# Patient Record
Sex: Female | Born: 1960
Health system: Southern US, Community
[De-identification: ages and names within clinical notes are randomized; demographics above are authoritative.]

## PROBLEM LIST (undated history)

## (undated) DIAGNOSIS — E559 Vitamin D deficiency, unspecified: Secondary | ICD-10-CM

## (undated) DIAGNOSIS — K589 Irritable bowel syndrome without diarrhea: Secondary | ICD-10-CM

## (undated) DIAGNOSIS — M069 Rheumatoid arthritis, unspecified: Secondary | ICD-10-CM

## (undated) DIAGNOSIS — R911 Solitary pulmonary nodule: Secondary | ICD-10-CM

## (undated) DIAGNOSIS — I10 Essential (primary) hypertension: Secondary | ICD-10-CM

## (undated) DIAGNOSIS — M2550 Pain in unspecified joint: Secondary | ICD-10-CM

## (undated) DIAGNOSIS — F419 Anxiety disorder, unspecified: Secondary | ICD-10-CM

## (undated) DIAGNOSIS — Z8639 Personal history of other endocrine, nutritional and metabolic disease: Secondary | ICD-10-CM

## (undated) DIAGNOSIS — R011 Cardiac murmur, unspecified: Secondary | ICD-10-CM

## (undated) DIAGNOSIS — M810 Age-related osteoporosis without current pathological fracture: Secondary | ICD-10-CM

## (undated) DIAGNOSIS — E2839 Other primary ovarian failure: Secondary | ICD-10-CM

## (undated) DIAGNOSIS — R002 Palpitations: Secondary | ICD-10-CM

## (undated) DIAGNOSIS — E739 Lactose intolerance, unspecified: Secondary | ICD-10-CM

## (undated) DIAGNOSIS — E669 Obesity, unspecified: Secondary | ICD-10-CM

## (undated) DIAGNOSIS — N63 Unspecified lump in unspecified breast: Secondary | ICD-10-CM

## (undated) HISTORY — DX: Cardiac murmur, unspecified: R01.1

## (undated) HISTORY — DX: Pain in unspecified joint: M25.50

## (undated) HISTORY — DX: Other primary ovarian failure: E28.39

## (undated) HISTORY — PX: WISDOM TOOTH EXTRACTION: SHX21

## (undated) HISTORY — DX: Lactose intolerance, unspecified: E73.9

## (undated) HISTORY — DX: Unspecified lump in unspecified breast: N63.0

## (undated) HISTORY — DX: Irritable bowel syndrome without diarrhea: K58.9

## (undated) HISTORY — DX: Rheumatoid arthritis, unspecified: M06.9

## (undated) HISTORY — DX: Anxiety disorder, unspecified: F41.9

## (undated) HISTORY — DX: Personal history of other endocrine, nutritional and metabolic disease: Z86.39

## (undated) HISTORY — DX: Vitamin D deficiency, unspecified: E55.9

## (undated) HISTORY — DX: Obesity, unspecified: E66.9

## (undated) HISTORY — DX: Solitary pulmonary nodule: R91.1

## (undated) HISTORY — PX: EXPLORATORY LAPAROTOMY: SUR591

## (undated) HISTORY — DX: Essential (primary) hypertension: I10

## (undated) HISTORY — DX: Age-related osteoporosis without current pathological fracture: M81.0

## (undated) HISTORY — DX: Palpitations: R00.2

---
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
## 1997-10-03 HISTORY — PX: CHOLECYSTECTOMY: SHX55

## 2007-10-04 DIAGNOSIS — R911 Solitary pulmonary nodule: Secondary | ICD-10-CM

## 2007-10-04 HISTORY — DX: Solitary pulmonary nodule: R91.1

## 2011-02-03 ENCOUNTER — Ambulatory Visit (INDEPENDENT_AMBULATORY_CARE_PROVIDER_SITE_OTHER): Payer: 59 | Admitting: Family Medicine

## 2011-02-03 ENCOUNTER — Encounter: Payer: Self-pay | Admitting: Family Medicine

## 2011-02-03 DIAGNOSIS — M069 Rheumatoid arthritis, unspecified: Secondary | ICD-10-CM

## 2011-02-03 DIAGNOSIS — R011 Cardiac murmur, unspecified: Secondary | ICD-10-CM

## 2011-02-03 DIAGNOSIS — L659 Nonscarring hair loss, unspecified: Secondary | ICD-10-CM

## 2011-02-03 DIAGNOSIS — R5381 Other malaise: Secondary | ICD-10-CM

## 2011-02-03 DIAGNOSIS — R5383 Other fatigue: Secondary | ICD-10-CM | POA: Insufficient documentation

## 2011-02-03 HISTORY — DX: Cardiac murmur, unspecified: R01.1

## 2011-02-03 LAB — CBC WITH DIFFERENTIAL/PLATELET
Eosinophils Relative: 1.7 % (ref 0.0–5.0)
HCT: 39 % (ref 36.0–46.0)
Hemoglobin: 13.3 g/dL (ref 12.0–15.0)
Lymphs Abs: 2.8 10*3/uL (ref 0.7–4.0)
MCV: 91.8 fl (ref 78.0–100.0)
Monocytes Absolute: 0.5 10*3/uL (ref 0.1–1.0)
Monocytes Relative: 6.7 % (ref 3.0–12.0)
Neutro Abs: 4.5 10*3/uL (ref 1.4–7.7)
Platelets: 271 10*3/uL (ref 150.0–400.0)
RDW: 13.4 % (ref 11.5–14.6)
WBC: 8 10*3/uL (ref 4.5–10.5)

## 2011-02-03 LAB — BASIC METABOLIC PANEL
BUN: 13 mg/dL (ref 6–23)
Chloride: 101 mEq/L (ref 96–112)
GFR: 81.93 mL/min (ref >60.00)
Potassium: 4.1 mEq/L (ref 3.5–5.1)

## 2011-02-03 LAB — HEPATIC FUNCTION PANEL
AST: 13 U/L (ref 0–37)
Albumin: 4.1 g/dL (ref 3.5–5.2)
Alkaline Phosphatase: 60 U/L (ref 39–117)
Total Bilirubin: 0.3 mg/dL (ref 0.3–1.2)

## 2011-02-03 LAB — T4, FREE: Free T4: 0.79 ng/dL (ref 0.60–1.60)

## 2011-02-03 LAB — TSH: TSH: 0.69 u[IU]/mL (ref 0.35–5.50)

## 2011-02-03 MED ORDER — INFLIXIMAB 100 MG IV SOLR: 100.0000 mg | INTRAVENOUS | Status: DC

## 2011-02-03 NOTE — Assessment & Plan Note (Signed)
Check labs Pt eats healthy diet

## 2011-02-03 NOTE — Assessment & Plan Note (Signed)
Consider cardio

## 2011-02-03 NOTE — Assessment & Plan Note (Signed)
On remicade per rheum

## 2011-02-03 NOTE — Progress Notes (Signed)
Quick Note:  Thyroid is normal b12 is low---- b12 injections weekly x4 Then monthly Recheck b12 1 month Refer to cardiology for extreme fatigue and bradycardia ______

## 2011-02-03 NOTE — Patient Instructions (Signed)
Check labs today We will consider Cardio referral if labs are normal Call us with any changes in symptoms

## 2011-02-03 NOTE — Progress Notes (Signed)
  Subjective:    Patient ID: Sharon Hunter, female    DOB: 11-10-1960, 50 y.o.   MRN: [id number]  HPI Pt here c/o extreme fatigue in episodes of no longer than 12 hours.   Pt recently went hiking ( which she does all the time with no problems) and became very sob and diaphoretic and was not able to finish hike.   She has no energy.  No Chest pain no muscle aches.  Normally she is not SOB as well.  Pt is establishing with Dr Corliss Skains as well for RA.  She also c/o throat pain. No trouble swallowing, no heartburn.  Pt also c/o hair loss--her hair has been coming out in clumps for a while now.     Review of Systems  Review of Systems  Constitutional: Negative for activity change, appetite change and fatigue.  HENT: Negative for hearing loss, congestion, tinnitus and ear discharge.   Eyes: Negative for visual disturbance (see optho q1y -- vision corrected to 20/20 with glasses).  Respiratory: Negative for cough, chest tightness and shortness of breath.   Cardiovascular: Negative for chest pain, palpitations and leg swelling.  + exercises--walking/hiking a week and weights. Gastrointestinal: Negative for abdominal pain, diarrhea, constipation and abdominal distention.  Genitourinary: Negative for urgency, frequency, decreased urine volume and difficulty urinating.  Musculoskeletal: Negative for back pain, arthralgias and gait problem.  Skin: Negative for color change, pallor and rash.  Neurological: Negative for dizziness, light-headedness, numbness and headaches.  Hematological: Negative for adenopathy. Does not bruise/bleed easily.  Psychiatric/Behavioral: Negative for suicidal ideas, confusion, sleep disturbance, self-injury, dysphoric mood, decreased concentration and agitation.       Objective:   Physical Exam  Constitutional: She is oriented to person, place, and time. She appears well-developed and well-nourished.  Neck: Normal range of motion. Neck supple.  Cardiovascular:  Normal rate and regular rhythm.   Murmur heard.      +SEM  2/6  Pulmonary/Chest: Effort normal and breath sounds normal.  Musculoskeletal: Normal range of motion. She exhibits no edema and no tenderness.  Neurological: She is alert and oriented to person, place, and time.  Skin: Skin is warm and dry. No rash noted. No erythema. No pallor.  Psychiatric: She has a normal mood and affect. Her behavior is normal. Judgment and thought content normal.          Assessment & Plan:

## 2011-02-03 NOTE — Assessment & Plan Note (Signed)
Check labs  ? Heart murmur or bradycardia Refer to cardio after labs back

## 2011-02-04 ENCOUNTER — Telehealth: Payer: Self-pay | Admitting: *Deleted

## 2011-02-04 ENCOUNTER — Encounter: Payer: Self-pay | Admitting: *Deleted

## 2011-02-04 ENCOUNTER — Encounter: Payer: Self-pay | Admitting: Family Medicine

## 2011-02-04 DIAGNOSIS — R001 Bradycardia, unspecified: Secondary | ICD-10-CM

## 2011-02-04 DIAGNOSIS — R5383 Other fatigue: Secondary | ICD-10-CM

## 2011-02-04 MED ORDER — CYANOCOBALAMIN 1000 MCG/ML IJ SOLN: 1000.0000 ug | INTRAMUSCULAR | Status: DC

## 2011-02-04 NOTE — Telephone Encounter (Addendum)
Discuss with patient, copy of labs mailed, Rx sent to pharmacy.   Message copied by Candie Echevaria on Fri Feb 04, 2011 11:15 AM ------      Message from: Loreen Freud      Created: Thu Feb 03, 2011  6:10 PM       Thyroid is normal      b12 is low---- b12 injections weekly x4  Then monthly      Recheck b12 1 month      Refer to cardiology for extreme fatigue and bradycardia

## 2011-02-16 ENCOUNTER — Institutional Professional Consult (permissible substitution): Payer: 59 | Admitting: Cardiology

## 2011-02-17 ENCOUNTER — Encounter: Payer: Self-pay | Admitting: Cardiology

## 2011-02-17 ENCOUNTER — Encounter: Payer: Self-pay | Admitting: *Deleted

## 2011-02-18 ENCOUNTER — Encounter: Payer: Self-pay | Admitting: Cardiology

## 2011-02-18 ENCOUNTER — Ambulatory Visit (INDEPENDENT_AMBULATORY_CARE_PROVIDER_SITE_OTHER): Payer: 59 | Admitting: Cardiology

## 2011-02-18 VITALS — BP 135/86 | HR 62 | Resp 16 | Ht 67.0 in | Wt 190.0 lb

## 2011-02-18 DIAGNOSIS — R0602 Shortness of breath: Secondary | ICD-10-CM

## 2011-02-18 DIAGNOSIS — R5381 Other malaise: Secondary | ICD-10-CM

## 2011-02-18 DIAGNOSIS — R5383 Other fatigue: Secondary | ICD-10-CM

## 2011-02-18 NOTE — Patient Instructions (Signed)
Your physician recommends that you schedule a follow-up appointment as needed with Dr. Shirlee Latch Your physician has requested that you have an echocardiogram. Echocardiography is a painless test that uses sound waves to create images of your heart. It provides your doctor with information about the size and shape of your heart and how well your heart's chambers and valves are working. This procedure takes approximately one hour. There are no restrictions for this procedure.

## 2011-02-20 ENCOUNTER — Encounter: Payer: Self-pay | Admitting: Cardiology

## 2011-02-20 NOTE — Assessment & Plan Note (Signed)
Patient has periodic episodes of profound fatigue that can last prolonged periods.  TSH and CBC normal.  She has excellent exercise capacity.  I do not think that these episodes are cardiac-related.  It is possible that she could be having a flare-up of her RA as she has been off infliximab now for a number of months.  - Followup with Dr. Corliss Skains (rheumatology) - Echocardiogram to make sure that the heart is structurally normal.

## 2011-02-20 NOTE — Progress Notes (Signed)
PCP: Dr. Laury Axon  50 yo with history of rheumatoid arthritis presents for evaluation of periodic fatigue and diaphoresis.  Patient has had episodic fatigue for 2 months now.  She noticed it first on a hiking trip to the mountains.  She felt profoundly fatigued and was unable to keep up with her hiking partners.  Since then, she has had episodes about every 4 days where she will feel profoundly fatigued.  This is not associated with exertion in particular.  It is also associated with diaphoresis at times.  No exertional chest pain or dyspnea.  She walks or hikes for 12-15 miles/week with no problems.  No lightheadedness or syncope.  She is doing well from the standpoint of her rheumatoid arthritis (minimal pain).   ECG: NSR at 54, normal  Labs (5/12): K 4.1, creatinine 0.8, TSH normal, free T4 normal, HCT 34     PMH: 1. HTN 2. Rheumatoid arthritis, on infliximab 3. B12 deficiency 4. Vitamin D deficiency 5. IBS 6. 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   FH: Sister with PCI at 30, father with MI at 3  ROS: All systems reviewed and negative except as per HPI  Current Outpatient Prescriptions  Medication Sig Dispense Refill  . ALPRAZolam (XANAX) 0.5 MG tablet Take 0.5 mg by mouth at bedtime as needed.        . cyanocobalamin (,VITAMIN B-12,) 1000 MCG/ML injection Inject 1 mL (1,000 mcg total) into the muscle once a week. x4 then monthly   4 mL  1  . losartan (COZAAR) 50 MG tablet Take 50 mg by mouth daily.        . NON FORMULARY Take by mouth daily. Napal juice drink daily for inflammation       . NON FORMULARY 300 mg. remicade  8 weeks infusion       . Vitamin D, Ergocalciferol, (DRISDOL) 50000 UNITS CAPS Take 50,000 Units by mouth every 7 (seven) days.          BP 135/86  Pulse 62  Resp 16  Ht 5\' 7"  (1.702 m)  Wt 190 lb (86.183 kg)  BMI 29.76 kg/m2  LMP 10/03/2005 General: NAD Neck: No JVD, no  thyromegaly or thyroid nodule.  Lungs: Clear to auscultation bilaterally with normal respiratory effort. CV: Nondisplaced PMI.  Heart regular S1/S2, no S3/S4, no murmur.  No peripheral edema.  No carotid bruit.  Normal pedal pulses.  Abdomen: Soft, nontender, no hepatosplenomegaly, no distention.  Skin: Intact without lesions or rashes.  Neurologic: Alert and oriented x 3.  Psych: Normal affect. Extremities: No clubbing or cyanosis.  HEENT: Normal.

## 2011-02-24 ENCOUNTER — Ambulatory Visit: Payer: 59 | Admitting: Family Medicine

## 2011-02-24 ENCOUNTER — Telehealth: Payer: Self-pay | Admitting: Family Medicine

## 2011-02-25 ENCOUNTER — Encounter: Payer: Self-pay | Admitting: Family Medicine

## 2011-02-25 MED ORDER — LOSARTAN POTASSIUM 50 MG PO TABS: 50.0000 mg | ORAL_TABLET | Freq: Every day | ORAL | Status: DC

## 2011-02-25 NOTE — Telephone Encounter (Signed)
Medication sent to pharmacy  

## 2011-03-01 ENCOUNTER — Other Ambulatory Visit (HOSPITAL_COMMUNITY): Payer: 59 | Admitting: Radiology

## 2011-03-11 ENCOUNTER — Other Ambulatory Visit (INDEPENDENT_AMBULATORY_CARE_PROVIDER_SITE_OTHER): Payer: 59 | Admitting: *Deleted

## 2011-03-11 DIAGNOSIS — E538 Deficiency of other specified B group vitamins: Secondary | ICD-10-CM

## 2011-03-11 LAB — VITAMIN B12: Vitamin B-12: 444 pg/mL (ref 211–911)

## 2011-06-21 ENCOUNTER — Other Ambulatory Visit: Payer: Self-pay | Admitting: Family Medicine

## 2011-06-21 MED ORDER — LOSARTAN POTASSIUM 50 MG PO TABS: 50.0000 mg | ORAL_TABLET | Freq: Every day | ORAL | Status: DC

## 2011-06-21 NOTE — Telephone Encounter (Signed)
Rx faxed.    KP 

## 2011-07-04 ENCOUNTER — Encounter: Payer: Self-pay | Admitting: Family Medicine

## 2011-07-04 ENCOUNTER — Other Ambulatory Visit (HOSPITAL_COMMUNITY)
Admission: RE | Admit: 2011-07-04 | Discharge: 2011-07-04 | Disposition: A | Discharge status: Home / Self Care | Payer: 59 | Source: Ambulatory Visit | Attending: Family Medicine | Admitting: Family Medicine

## 2011-07-04 ENCOUNTER — Ambulatory Visit (INDEPENDENT_AMBULATORY_CARE_PROVIDER_SITE_OTHER): Payer: 59 | Admitting: Family Medicine

## 2011-07-04 VITALS — BP 136/84 | HR 75 | Temp 98.6°F | Ht 66.0 in | Wt 203.6 lb

## 2011-07-04 DIAGNOSIS — E559 Vitamin D deficiency, unspecified: Secondary | ICD-10-CM

## 2011-07-04 DIAGNOSIS — Z01419 Encounter for gynecological examination (general) (routine) without abnormal findings: Secondary | ICD-10-CM

## 2011-07-04 DIAGNOSIS — K589 Irritable bowel syndrome without diarrhea: Secondary | ICD-10-CM

## 2011-07-04 DIAGNOSIS — R911 Solitary pulmonary nodule: Secondary | ICD-10-CM

## 2011-07-04 DIAGNOSIS — I10 Essential (primary) hypertension: Secondary | ICD-10-CM

## 2011-07-04 DIAGNOSIS — J984 Other disorders of lung: Secondary | ICD-10-CM

## 2011-07-04 DIAGNOSIS — Z1231 Encounter for screening mammogram for malignant neoplasm of breast: Secondary | ICD-10-CM

## 2011-07-04 MED ORDER — VITAMIN D (ERGOCALCIFEROL) 1.25 MG (50000 UNIT) PO CAPS: 50000.0000 [IU] | ORAL_CAPSULE | ORAL | Status: DC

## 2011-07-04 MED ORDER — LOSARTAN POTASSIUM 100 MG PO TABS: 100.0000 mg | ORAL_TABLET | Freq: Every day | ORAL | Status: DC

## 2011-07-04 NOTE — Patient Instructions (Signed)

## 2011-07-04 NOTE — Progress Notes (Signed)
Subjective:     Sharon Hunter is a 50 y.o. female and is here for a comprehensive physical exam. The patient reports problems - pt still c/o abd pain--- pt with hx IBS.  History   Social History  . Marital Status: Single    Spouse Name: N/A    Number of Children: 1  . Years of Education: 17   Occupational History  . Not on file.   Social History Main Topics  . Smoking status: Former Smoker    Quit date: 10/03/1990  . Smokeless tobacco: Not on file  . Alcohol Use: 2.5 oz/week    5 drink(s) per week  . Drug Use: Not on file  . Sexually Active: Not on file   Other Topics Concern  . Not on file   Social History Narrative  . No narrative on file   Health Maintenance  Topic Date Due  . Pap Smear  04/04/1979  . Mammogram  04/04/2011  . Colonoscopy  04/04/2011  . Influenza Vaccine  07/04/2011  . Tetanus/tdap  12/02/2019    The following portions of the patient's history were reviewed and updated as appropriate: allergies, current medications, past family history, past medical history, past social history, past surgical history and problem list.  Review of Systems Review of Systems  Constitutional: Negative for activity change, appetite change and fatigue.  HENT: Negative for hearing loss, congestion, tinnitus and ear discharge.  dentist q11m Eyes: Negative for visual disturbance (see optho q1y -- vision corrected to 20/20 with glasses).  Respiratory: Negative for cough, chest tightness and shortness of breath.   Cardiovascular: Negative for chest pain, palpitations and leg swelling.  Gastrointestinal: Negative for abdominal pain, diarrhea, constipation and abdominal distention.  Genitourinary: Negative for urgency, frequency, decreased urine volume and difficulty urinating.  Musculoskeletal: Negative for back pain, arthralgias and gait problem.  Skin: Negative for color change, pallor and rash.  Neurological: Negative for dizziness, light-headedness, numbness and headaches.    Hematological: Negative for adenopathy. Does not bruise/bleed easily.  Psychiatric/Behavioral: Negative for suicidal ideas, confusion, sleep disturbance, self-injury, dysphoric mood, decreased concentration and agitation.       Objective:    BP 136/84  Pulse 75  Temp(Src) 98.6 F (37 C) (Oral)  Wt 203 lb 9.6 oz (92.352 kg)  SpO2 97%  LMP 10/03/2005 General appearance: alert, cooperative, appears stated age and no distress Throat: lips, mucosa, and tongue normal; teeth and gums normal Neck: no adenopathy, no carotid bruit, no JVD, supple, symmetrical, trachea midline and thyroid not enlarged, symmetric, no tenderness/mass/nodules Lungs: clear to auscultation bilaterally Heart: S1, S2 normal and + SEM Abdomen: normal findings: bowel sounds normal, liver span normal to percussion, no masses palpable and no organomegaly and abnormal findings:  + min tenderness periumbilical area--no rebound/ guarding Pelvic: cervix normal in appearance, external genitalia normal, no adnexal masses or tenderness, no cervical motion tenderness, rectovaginal septum normal, uterus normal size, shape, and consistency and vagina normal without discharge Extremities: extremities normal, atraumatic, no cyanosis or edema Pulses: 2+ and symmetric Skin: Skin color, texture, turgor normal. No rashes or lesions Lymph nodes: Cervical, supraclavicular, and axillary nodes normal. Neurologic: Alert and oriented X 3, normal strength and tone. Normal symmetric reflexes. Normal coordination and gait    Assessment:    Healthy female exam.     Hx IBS---refer to GI,  Pt also needs colon HTN--- inc cozaar to 100 mg qd  Plan:  Check fasting labs    See After Visit Summary for Counseling  Recommendations

## 2011-07-05 ENCOUNTER — Encounter: Payer: Self-pay | Admitting: Gastroenterology

## 2011-07-05 ENCOUNTER — Encounter: Payer: Self-pay | Admitting: Internal Medicine

## 2011-07-15 ENCOUNTER — Ambulatory Visit
Admission: RE | Admit: 2011-07-15 | Discharge: 2011-07-15 | Disposition: A | Discharge status: Home / Self Care | Payer: 59 | Source: Ambulatory Visit | Attending: Family Medicine | Admitting: Family Medicine

## 2011-07-15 DIAGNOSIS — Z1231 Encounter for screening mammogram for malignant neoplasm of breast: Secondary | ICD-10-CM

## 2011-09-02 ENCOUNTER — Encounter: Payer: Self-pay | Admitting: Internal Medicine

## 2011-09-02 ENCOUNTER — Ambulatory Visit (INDEPENDENT_AMBULATORY_CARE_PROVIDER_SITE_OTHER): Payer: 59 | Admitting: Internal Medicine

## 2011-09-02 VITALS — BP 108/70 | HR 72 | Ht 67.0 in | Wt 207.0 lb

## 2011-09-02 DIAGNOSIS — R102 Pelvic and perineal pain: Secondary | ICD-10-CM

## 2011-09-02 DIAGNOSIS — R198 Other specified symptoms and signs involving the digestive system and abdomen: Secondary | ICD-10-CM

## 2011-09-02 DIAGNOSIS — R109 Unspecified abdominal pain: Secondary | ICD-10-CM

## 2011-09-02 DIAGNOSIS — K589 Irritable bowel syndrome without diarrhea: Secondary | ICD-10-CM

## 2011-09-02 MED ORDER — PEG-KCL-NACL-NASULF-NA ASC-C 100 G PO SOLR: 1.0000 | Freq: Once | ORAL | Status: DC

## 2011-09-02 NOTE — Progress Notes (Addendum)
Subjective:    Patient ID: Sharon Hunter, female    DOB: June 01, 1961, 50 y.o.   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 She is here about long-term suprapubic abdominal pain as well as change in bowel habits. She reports about very defined area of pain in between the umbilicus and symphysis pubis, which has been there about 3 years. It is not directly associated with urinary symptoms though she's had some history of urinary tract infection as well as microhematuria. She saw a urologist in [place] and after an in and out catheterization was told there were no problems. That was the extent of the evaluation.  The pain is described as sharp and crampy at times, intermittent, maybe increasing in frequency. It occurs post defecation more often than not. She also says that her bowel movements have always been sluggish or on the constipated side. Now over the past few years she's having intermittent explosive diarrhea at times. Stool caliber is smaller. There is no bleeding. There is some associated bloating and gas. She did have a colonoscopy around 1990, she reports that it was incomplete. It was to evaluate abdominal pain.  She has a history of rheumatoid arthritis and has been on prednisone and Remicade, steroids were stopped many years ago, in the past year, in fact 50 months, she has stopped Remicade and remains in remission. She has some concern about the possibility of complications due to Remicade such as cancer or atypical infection. She has been labeled as having irritable bowel syndrome in the past, it seems.  She drinks 2 cups of caffeinated beverages a day. She is not a smoker. He is not complaining of anxiety or particular stress.   Allergies  Allergen Reactions  . Other     Contrast dye   Outpatient Prescriptions Prior to Visit  Medication Sig Dispense Refill  . ALPRAZolam (XANAX) 0.5 MG tablet Take 0.5 mg  by mouth at bedtime as needed.        Marland Kitchen losartan (COZAAR) 100 MG tablet Take 1 tablet (100 mg total) by mouth daily.  90 tablet  3  . Vitamin D, Ergocalciferol, (DRISDOL) 50000 UNITS CAPS Take 1 capsule (50,000 Units total) by mouth every 7 (seven) days.  12 capsule  3   Past Medical History  Diagnosis Date  . Rheumatoid arthritis     in remission , off meds as of 50 year 2012  . Hypertension   . Migraine   . Breast lump     stable-- gets annual mammogram  . Pulmonary nodule, left 2009    last CT Nov 2011-- due 08/2011  . Murmur 02/03/2011  . Anxiety   . Ovarian failure     Past Surgical History  Procedure Date  . Cholecystectomy 1999  . Tonsillectomy 1969   History   Social History  . Marital Status:  divorced    Spouse Name: N/A    Number of Children: 1  . Years of Education: 16   Social History Main Topics  . Smoking status: Former Smoker    Quit date: 10/03/1990  . Smokeless tobacco: None  . Alcohol Use: 2.5 oz/week    5 drink(s) per week     Red wine 4 oz 4-5 times a week  . Drug Use: No  . Sexually Active: None        [facility] RN       Family History  Problem Relation  Age of Onset  . Alcohol abuse Father   . Hyperlipidemia Father   . Heart disease Father   . Alcohol abuse Brother   . Diabetes Brother   . Hypertension Brother   . Arthritis Mother   . Uterine cancer Mother   . Hypertension Mother   . Uterine cancer Sister   . Heart disease Sister     cardiac stent  . Diabetes Sister   . Hyperlipidemia Sister   . Hypertension Sister   . Cancer Brother     lung  . Cancer Maternal Aunt     lung----non smoker       Review of Systems some back pain, history of heart murmur, all other review of systems reviewed and are negative except as mentioned above.     Objective:   Physical Exam General:  Well-developed, well-nourished and in no acute distress Eyes:  anicteric. ENT:   Mouth and posterior pharynx free of lesions.  Neck:      supple w/o thyromegaly or mass.  Lungs: Clear to auscultation bilaterally. Heart:  S1S2, no rubs, murmurs, gallops. Abdomen:  soft, , no hepatosplenomegaly, hernia, or mass and BS+.  Mildly tender in the suprapubic area Rectal: Deferred until colonoscopy Lymph:  no cervical or supraclavicular adenopathy. Extremities:   no edema Neuro:  A&O x 3.  Psych:  appropriate mood and  affect.   Data reviewed includes prior office notes laboratory studies filed in the records.      Assessment & Plan:  She has 3 years or so suprapubic abdominal pain and change in bowel habits. There is a history of Remicade. Etiology of these problems are not clear, they are most likely IBS but there is a change in bowel habits including decreased stool caliber.  Colonoscopy will be undertaken to investigate these problems.The risks and benefits as well as alternatives of endoscopic procedure(s) have been discussed and reviewed. All questions answered. The patient agrees to proceed.   She may need to see urology, she had microhematuria in the past but from what she is telling me she just had an in and out catheterization urinalysis and was told there were no problems.

## 2011-09-02 NOTE — Patient Instructions (Signed)
You have been scheduled for a Colonoscopy with separate instructions given. Your prep kit has been sent to your pharmacy for you to pick up. 

## 2011-09-04 ENCOUNTER — Encounter: Payer: Self-pay | Admitting: Internal Medicine

## 2011-09-21 ENCOUNTER — Other Ambulatory Visit (INDEPENDENT_AMBULATORY_CARE_PROVIDER_SITE_OTHER): Payer: 59 | Admitting: *Deleted

## 2011-09-21 DIAGNOSIS — Z01419 Encounter for gynecological examination (general) (routine) without abnormal findings: Secondary | ICD-10-CM

## 2011-09-21 DIAGNOSIS — I10 Essential (primary) hypertension: Secondary | ICD-10-CM

## 2011-09-21 LAB — LIPID PANEL
Cholesterol: 200 mg/dL (ref 0–200)
HDL: 82.9 mg/dL (ref >39.00)
LDL Cholesterol: 98 mg/dL (ref 0–99)
Total CHOL/HDL Ratio: 2
Triglycerides: 95 mg/dL (ref 0.0–149.0)
VLDL: 19 mg/dL (ref 0.0–40.0)

## 2011-09-21 LAB — CBC WITH DIFFERENTIAL/PLATELET
Basophils Absolute: 0 10*3/uL (ref 0.0–0.1)
Eosinophils Absolute: 0.1 10*3/uL (ref 0.0–0.7)
Lymphocytes Relative: 33.3 % (ref 12.0–46.0)
MCHC: 33.7 g/dL (ref 30.0–36.0)
MCV: 91.7 fl (ref 78.0–100.0)
Monocytes Absolute: 0.4 10*3/uL (ref 0.1–1.0)
Neutro Abs: 3.7 10*3/uL (ref 1.4–7.7)
Neutrophils Relative %: 58.5 % (ref 43.0–77.0)
RDW: 13.3 % (ref 11.5–14.6)

## 2011-09-21 LAB — TSH: TSH: 0.79 u[IU]/mL (ref 0.35–5.50)

## 2011-09-21 LAB — HEPATIC FUNCTION PANEL
ALT: 20 U/L (ref 0–35)
Bilirubin, Direct: 0 mg/dL (ref 0.0–0.3)
Total Bilirubin: 0.6 mg/dL (ref 0.3–1.2)

## 2011-09-21 LAB — BASIC METABOLIC PANEL
Chloride: 109 mEq/L (ref 96–112)
Creatinine, Ser: 0.7 mg/dL (ref 0.4–1.2)
GFR: 88.13 mL/min (ref >60.00)
Potassium: 4.1 mEq/L (ref 3.5–5.1)

## 2011-09-24 LAB — VITAMIN D 1,25 DIHYDROXY
Vitamin D 1, 25 (OH)2 Total: 54 pg/mL (ref 18–72)
Vitamin D3 1, 25 (OH)2: 10 pg/mL

## 2011-09-26 MED ORDER — VITAMIN D (ERGOCALCIFEROL) 1.25 MG (50000 UNIT) PO CAPS: 50000.0000 [IU] | ORAL_CAPSULE | ORAL | Status: DC

## 2011-10-25 ENCOUNTER — Other Ambulatory Visit: Payer: Self-pay | Admitting: Cardiovascular Disease

## 2011-10-25 MED ORDER — HYDROCOD POLST-CHLORPHEN POLST 10-8 MG/5ML PO LQCR: 5.0000 mL | Freq: Every evening | ORAL | Status: DC | PRN

## 2011-10-25 NOTE — Progress Notes (Signed)
Sharon Hunter has had a cough and sore throat for the past several days.  Her cough is keeping her up all night long.  Will call in Tussionex 115 cc, she is to take 1 tsp at night PRN.  [name], [name]., MD, [facility] 10/25/2011, 9:57 AM

## 2011-10-28 ENCOUNTER — Encounter: Payer: Self-pay | Admitting: Family Medicine

## 2011-10-28 ENCOUNTER — Ambulatory Visit (INDEPENDENT_AMBULATORY_CARE_PROVIDER_SITE_OTHER): Payer: 59 | Admitting: Family Medicine

## 2011-10-28 ENCOUNTER — Ambulatory Visit: Payer: 59 | Admitting: Family Medicine

## 2011-10-28 VITALS — BP 124/82 | HR 64 | Temp 97.9°F | Wt 199.0 lb

## 2011-10-28 DIAGNOSIS — N760 Acute vaginitis: Secondary | ICD-10-CM

## 2011-10-28 DIAGNOSIS — H109 Unspecified conjunctivitis: Secondary | ICD-10-CM

## 2011-10-28 DIAGNOSIS — R32 Unspecified urinary incontinence: Secondary | ICD-10-CM

## 2011-10-28 LAB — POCT URINALYSIS DIPSTICK
Leukocytes, UA: NEGATIVE
Protein, UA: NEGATIVE
Urobilinogen, UA: 0.2
pH, UA: 5

## 2011-10-28 MED ORDER — MOXIFLOXACIN HCL 0.5 % OP SOLN: 1.0000 [drp] | Freq: Three times a day (TID) | OPHTHALMIC | Status: AC

## 2011-10-28 NOTE — Progress Notes (Signed)
  Subjective:    Patient ID: Sharon Hunter, female    DOB: 03-09-1961, 51 y.o.   MRN: [id number]  HPI Pt here c/o pink eye and cough for 1 week  And she is taking tussionex at night.  Pt states she had a fever but didn't take temp---just felt warm.    No other otc. Pt also has vaginal d/c--- thin, watery, yellow.  No odor, no vaginal itching etc.  No abd pain.   Review of Systems as above   Objective:   Physical Exam  Constitutional: She is oriented to person, place, and time. She appears well-developed and well-nourished. No distress.  Cardiovascular: Normal rate and regular rhythm.   No murmur heard. Pulmonary/Chest: Effort normal and breath sounds normal. No respiratory distress. She has no wheezes. She has no rales. She exhibits no tenderness.  Genitourinary: Uterus normal. Vaginal discharge found.       + thin watery, yellow d/c No d/c  Neurological: She is alert and oriented to person, place, and time.  Skin: She is not diaphoretic.  Psychiatric: She has a normal mood and affect. Her behavior is normal. Judgment and thought content normal.          Assessment & Plan:  Vag d/c--- ? Etiology--- cultures sent                  UA neg Conjunctivitis-- vigamox gtts,   rto prn

## 2011-10-28 NOTE — Progress Notes (Signed)
Addended by: Arnette Norris on: 10/28/2011 01:11 PM   Modules accepted: Orders

## 2011-10-31 ENCOUNTER — Ambulatory Visit (AMBULATORY_SURGERY_CENTER): Payer: 59 | Admitting: Internal Medicine

## 2011-10-31 ENCOUNTER — Encounter: Payer: Self-pay | Admitting: Internal Medicine

## 2011-10-31 VITALS — BP 157/72 | HR 87 | Temp 97.9°F | Resp 16 | Ht 67.0 in | Wt 207.0 lb

## 2011-10-31 DIAGNOSIS — R198 Other specified symptoms and signs involving the digestive system and abdomen: Secondary | ICD-10-CM

## 2011-10-31 HISTORY — PX: COLONOSCOPY: SHX174

## 2011-10-31 LAB — WET PREP BY MOLECULAR PROBE
Candida species: NEGATIVE
Gardnerella vaginalis: NEGATIVE
Trichomonas vaginosis: NEGATIVE

## 2011-10-31 MED ORDER — SODIUM CHLORIDE 0.9 % IV SOLN: 500.0000 mL | INTRAVENOUS | Status: DC

## 2011-10-31 NOTE — Patient Instructions (Addendum)
The colonoscopy was normal. This supports a diagnosis of IBS or Irritable Bowel Syndrome. Please read the information provided. You should increase fiber in your diet to see if that helps smooth out your bowel habits, and you can also add a fiber supplement (example is Metamucil or psyllium, another is Citrucel. You can start with a teaspoon in water daily ad increase to a tablespoon in water every day. Gas and bloating often responds to a probiotic supplement - Align is one we have a lot of success with. Some of your pain could be related to a possible urologic problem and if you have persistent problems with pain I think it would be appropriate to see a urologist (again).  I can see you as needed at this pit. Iva Boop, MD, [facility]'  Please refer to blue and green discharge instruction sheets.

## 2011-10-31 NOTE — Progress Notes (Signed)
Patient did not have preoperative order for IV antibiotic SSI prophylaxis. (G8918)  Patient did not experience any of the following events: a burn prior to discharge; a fall within the facility; wrong site/side/patient/procedure/implant event; or a hospital transfer or hospital admission upon discharge from the facility. (G8907)  

## 2011-10-31 NOTE — Op Note (Signed)
[facility] [address] [place], [place]  [address]  COLONOSCOPY PROCEDURE REPORT  PATIENT:  Sharon Hunter, Sharon Hunter  MR#:  [id number] BIRTHDATE:  November 29, 1960, 50 yrs. old  GENDER:  female ENDOSCOPIST:  Iva Boop, MD, [facility] REF. BY:  Loreen Freud, DO PROCEDURE DATE:  10/31/2011 PROCEDURE:  Colonoscopy 86578 ASA CLASS:  Class I INDICATIONS:  change in bowel habits MEDICATIONS:   These medications were titrated to patient response per physician's verbal order, Fentanyl 50 mcg IV, Versed 8 mg IV  DESCRIPTION OF PROCEDURE:   After the risks benefits and alternatives of the procedure were thoroughly explained, informed consent was obtained.  Digital rectal exam was performed and revealed no abnormalities.   The LB [id number] endoscope was introduced through the anus and advanced to the cecum, which was identified by both the appendix and ileocecal valve, without limitations.  The quality of the prep was excellent, using MoviPrep.  The instrument was then slowly withdrawn as the colon was fully examined. <<PROCEDUREIMAGES>>  FINDINGS:  A normal appearing cecum, ileocecal valve, and appendiceal orifice were identified. The ascending, hepatic flexure, transverse, splenic flexure, descending, sigmoid colon, and rectum appeared unremarkable.   Retroflexed views in the rectum revealed no abnormalities.    The time to cecum = 3:24 minutes. The scope was then withdrawn in 13:45 minutes from the cecum and the procedure completed. COMPLICATIONS:  None ENDOSCOPIC IMPRESSION: 1) Normal colonoscopy with excellent prep RECOMMENDATIONS: 1) Increase fiber 2) IBS handout 3) Add probiotic if fiber not enough 4) If persistent abdominal pain would see GU 5) F/U GI prn REPEAT EXAM:  In 10 year(s) for routine screening colonoscopy.  Iva Boop, MD, [facility]  CC:  The Patient and Lelon Perla, DO  n. eSIGNED:   Iva Boop at 10/31/2011 09:08 AM  Mahalia Longest, [phone]

## 2011-10-31 NOTE — Progress Notes (Signed)
Per pt no iv sticks in her hands. ewm

## 2011-11-01 ENCOUNTER — Telehealth: Payer: Self-pay

## 2011-11-01 NOTE — Telephone Encounter (Signed)
  Follow up Call-  Call back number 10/31/2011  Post procedure Call Back phone  # [phone]  Permission to leave phone message Yes     Patient questions:  Do you have a fever, pain , or abdominal swelling? no Pain Score  0 *  Have you tolerated food without any problems? yes  Have you been able to return to your normal activities? yes  Do you have any questions about your discharge instructions: Diet   no Medications  no Follow up visit  no  Do you have questions or concerns about your Care? no  Actions: * If pain score is 4 or above: No action needed, pain <4.

## 2012-05-25 ENCOUNTER — Encounter: Payer: Self-pay | Admitting: Family Medicine

## 2012-05-25 ENCOUNTER — Ambulatory Visit (INDEPENDENT_AMBULATORY_CARE_PROVIDER_SITE_OTHER): Payer: 59 | Admitting: Family Medicine

## 2012-05-25 VITALS — BP 122/72 | HR 70 | Temp 98.3°F | Wt 207.0 lb

## 2012-05-25 DIAGNOSIS — R05 Cough: Secondary | ICD-10-CM

## 2012-05-25 DIAGNOSIS — R911 Solitary pulmonary nodule: Secondary | ICD-10-CM

## 2012-05-25 DIAGNOSIS — R059 Cough, unspecified: Secondary | ICD-10-CM

## 2012-05-25 DIAGNOSIS — J309 Allergic rhinitis, unspecified: Secondary | ICD-10-CM

## 2012-05-25 DIAGNOSIS — J302 Other seasonal allergic rhinitis: Secondary | ICD-10-CM

## 2012-05-25 MED ORDER — FLUTICASONE PROPIONATE 50 MCG/ACT NA SUSP: 2.0000 | Freq: Every day | NASAL | Status: DC

## 2012-05-25 MED ORDER — LOSARTAN POTASSIUM 100 MG PO TABS: 100.0000 mg | ORAL_TABLET | Freq: Every day | ORAL | Status: DC

## 2012-05-25 NOTE — Progress Notes (Signed)
  Subjective:     Sharon Hunter is a 51 y.o. female here for evaluation of a cough. Onset of symptoms was 1 month ago. Symptoms have been unchanged since that time. The cough is dry and is aggravated by nothing. Associated symptoms include: chest pain. Patient does not have a history of asthma. Patient does not have a history of environmental allergens. Patient has not traveled recently. Patient does not have a history of smoking. Patient has not had a previous chest x-ray. Patient has not had a PPD done.  Pt has had CTs of chest secondary to lung nodule.  The following portions of the patient's history were reviewed and updated as appropriate: allergies, current medications, past family history, past medical history, past social history, past surgical history and problem list.  Review of Systems Pertinent items are noted in HPI.    Objective:    Oxygen saturation 98% on room air BP 122/72  Pulse 70  Temp 98.3 F (36.8 C) (Oral)  Wt 207 lb (93.895 kg)  SpO2 98%  LMP 10/03/2005 General appearance: alert, cooperative, appears stated age and no distress Lungs: rales RLL Heart: S1, S2 normal Extremities: extremities normal, atraumatic, no cyanosis or edema    Assessment:    Cough ---hx lung nodule    Plan:    Explained lack of efficacy of antibiotics in viral disease. Avoid exposure to tobacco smoke and fumes. Call if shortness of breath worsens, blood in sputum, change in character of cough, development of fever or chills, inability to maintain nutrition and hydration. Avoid exposure to tobacco smoke and fumes.  cxr Pt due for ct--will get records

## 2012-05-25 NOTE — Patient Instructions (Signed)

## 2012-06-01 ENCOUNTER — Ambulatory Visit (INDEPENDENT_AMBULATORY_CARE_PROVIDER_SITE_OTHER)
Admission: RE | Admit: 2012-06-01 | Discharge: 2012-06-01 | Disposition: A | Discharge status: Home / Self Care | Payer: 59 | Source: Ambulatory Visit | Attending: Family Medicine | Admitting: Family Medicine

## 2012-06-01 DIAGNOSIS — R911 Solitary pulmonary nodule: Secondary | ICD-10-CM

## 2012-07-04 ENCOUNTER — Telehealth: Payer: Self-pay

## 2012-07-04 NOTE — Telephone Encounter (Signed)
Dr.Lowne reviewed records and per documentation nor further CT necessary. Patient has been mae aware and voiced understanding,     KP

## 2012-10-04 ENCOUNTER — Other Ambulatory Visit: Payer: Self-pay | Admitting: Family Medicine

## 2012-10-04 DIAGNOSIS — Z1231 Encounter for screening mammogram for malignant neoplasm of breast: Secondary | ICD-10-CM

## 2012-10-08 ENCOUNTER — Telehealth: Payer: Self-pay

## 2012-10-08 DIAGNOSIS — E282 Polycystic ovarian syndrome: Secondary | ICD-10-CM

## 2012-10-08 NOTE — Telephone Encounter (Signed)
Message copied by Arnette Norris on Mon Oct 08, 2012  2:53 PM ------      Message from: [name]      Created: Mon Oct 08, 2012  2:08 PM      Regarding: referral        Dr Laury Axon,            This is Sharon Hunter. I have had symptoms for years that now make me wonder if I could have polycystic ovarian disease. 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 I may have been miss diagnosed 35 years ago. They didn't know much about PCOD.             I am wanting a referral to Dr [facility] for a work up, can you please refer me?            Work [facility] [phone]      Cell [phone]            Thank you      Jodette

## 2012-10-08 NOTE — Telephone Encounter (Signed)
Ok to put referral in but if we dont  have any ov discussing it they may want her seen here first.  Also tell her to use phone or My chart for messages so it is documented in her chart.  Staff messages are not permanently in chart.

## 2012-10-10 ENCOUNTER — Ambulatory Visit
Admission: RE | Admit: 2012-10-10 | Discharge: 2012-10-10 | Disposition: A | Discharge status: Home / Self Care | Payer: 59 | Source: Ambulatory Visit | Attending: Family Medicine | Admitting: Family Medicine

## 2012-10-10 DIAGNOSIS — Z1231 Encounter for screening mammogram for malignant neoplasm of breast: Secondary | ICD-10-CM

## 2012-10-12 ENCOUNTER — Ambulatory Visit: Payer: 59

## 2012-11-17 ENCOUNTER — Other Ambulatory Visit: Payer: Self-pay

## 2012-11-20 ENCOUNTER — Other Ambulatory Visit: Payer: Self-pay | Admitting: *Deleted

## 2012-11-20 DIAGNOSIS — M779 Enthesopathy, unspecified: Secondary | ICD-10-CM

## 2012-11-20 MED ORDER — METHYLPREDNISOLONE 4 MG PO KIT: PACK | ORAL | Status: DC

## 2012-12-10 ENCOUNTER — Other Ambulatory Visit: Payer: Self-pay | Admitting: Family Medicine

## 2013-02-01 ENCOUNTER — Other Ambulatory Visit: Payer: Self-pay | Admitting: *Deleted

## 2013-02-01 ENCOUNTER — Ambulatory Visit (INDEPENDENT_AMBULATORY_CARE_PROVIDER_SITE_OTHER): Payer: 59 | Admitting: *Deleted

## 2013-02-01 DIAGNOSIS — Z79899 Other long term (current) drug therapy: Secondary | ICD-10-CM

## 2013-02-01 DIAGNOSIS — R5381 Other malaise: Secondary | ICD-10-CM

## 2013-02-01 DIAGNOSIS — R011 Cardiac murmur, unspecified: Secondary | ICD-10-CM

## 2013-02-01 DIAGNOSIS — M069 Rheumatoid arthritis, unspecified: Secondary | ICD-10-CM

## 2013-02-01 DIAGNOSIS — R911 Solitary pulmonary nodule: Secondary | ICD-10-CM

## 2013-02-01 DIAGNOSIS — R5383 Other fatigue: Secondary | ICD-10-CM

## 2013-02-01 DIAGNOSIS — L659 Nonscarring hair loss, unspecified: Secondary | ICD-10-CM

## 2013-02-01 LAB — CBC WITH DIFFERENTIAL/PLATELET
Basophils Relative: 0.5 % (ref 0.0–3.0)
Eosinophils Absolute: 0.1 10*3/uL (ref 0.0–0.7)
HCT: 39.5 % (ref 36.0–46.0)
Hemoglobin: 13.5 g/dL (ref 12.0–15.0)
Lymphocytes Relative: 40.5 % (ref 12.0–46.0)
Lymphs Abs: 3.1 10*3/uL (ref 0.7–4.0)
MCHC: 34.1 g/dL (ref 30.0–36.0)
MCV: 88.9 fl (ref 78.0–100.0)
Neutro Abs: 3.8 10*3/uL (ref 1.4–7.7)
RBC: 4.44 Mil/uL (ref 3.87–5.11)

## 2013-02-04 LAB — COMPREHENSIVE METABOLIC PANEL
AST: 21 U/L (ref 0–37)
BUN: 17 mg/dL (ref 6–23)
Calcium: 9.2 mg/dL (ref 8.4–10.5)
Chloride: 103 mEq/L (ref 96–112)
Creatinine, Ser: 0.8 mg/dL (ref 0.4–1.2)
GFR: 77.86 mL/min (ref >60.00)
Total Bilirubin: 0.4 mg/dL (ref 0.3–1.2)

## 2013-05-20 ENCOUNTER — Other Ambulatory Visit: Payer: Self-pay | Admitting: *Deleted

## 2013-05-20 MED ORDER — LOSARTAN POTASSIUM 100 MG PO TABS: ORAL_TABLET | ORAL | Status: DC

## 2013-05-20 NOTE — Telephone Encounter (Signed)
Rx was filled spoke with patient and she made an appointment for an office visit.  AG cma

## 2013-05-20 NOTE — Telephone Encounter (Signed)
Pharmacy has requesting a refill for Losartan however the patient need a ov.  I have called patient and she has agreed to schedule a appointment in order to receive her prescription.

## 2013-05-23 ENCOUNTER — Ambulatory Visit (INDEPENDENT_AMBULATORY_CARE_PROVIDER_SITE_OTHER): Payer: 59 | Admitting: Family Medicine

## 2013-05-23 ENCOUNTER — Encounter: Payer: Self-pay | Admitting: Family Medicine

## 2013-05-23 ENCOUNTER — Other Ambulatory Visit (HOSPITAL_COMMUNITY)
Admission: RE | Admit: 2013-05-23 | Discharge: 2013-05-23 | Disposition: A | Discharge status: Home / Self Care | Payer: 59 | Source: Ambulatory Visit | Attending: Family Medicine | Admitting: Family Medicine

## 2013-05-23 VITALS — BP 118/70 | HR 59 | Temp 98.2°F | Ht 66.0 in | Wt 191.0 lb

## 2013-05-23 DIAGNOSIS — I10 Essential (primary) hypertension: Secondary | ICD-10-CM

## 2013-05-23 DIAGNOSIS — Z01419 Encounter for gynecological examination (general) (routine) without abnormal findings: Secondary | ICD-10-CM | POA: Insufficient documentation

## 2013-05-23 DIAGNOSIS — Z124 Encounter for screening for malignant neoplasm of cervix: Secondary | ICD-10-CM

## 2013-05-23 DIAGNOSIS — M069 Rheumatoid arthritis, unspecified: Secondary | ICD-10-CM

## 2013-05-23 DIAGNOSIS — E538 Deficiency of other specified B group vitamins: Secondary | ICD-10-CM

## 2013-05-23 DIAGNOSIS — Z1151 Encounter for screening for human papillomavirus (HPV): Secondary | ICD-10-CM | POA: Insufficient documentation

## 2013-05-23 DIAGNOSIS — Z0000 Encounter for general adult medical examination without abnormal findings: Secondary | ICD-10-CM

## 2013-05-23 DIAGNOSIS — E785 Hyperlipidemia, unspecified: Secondary | ICD-10-CM

## 2013-05-23 LAB — LDL CHOLESTEROL, DIRECT: Direct LDL: 132.6 mg/dL

## 2013-05-23 LAB — LIPID PANEL: Total CHOL/HDL Ratio: 3

## 2013-05-23 LAB — VITAMIN B12: Vitamin B-12: 775 pg/mL (ref 211–911)

## 2013-05-23 MED ORDER — MOMETASONE FUROATE 50 MCG/ACT NA SUSP: 1.0000 | Freq: Every day | NASAL | Status: DC

## 2013-05-23 MED ORDER — LOSARTAN POTASSIUM 100 MG PO TABS: 100.0000 mg | ORAL_TABLET | Freq: Every day | ORAL | Status: DC

## 2013-05-23 NOTE — Progress Notes (Signed)
Subjective:     Sharon Hunter is a 52 y.o. female and is here for a comprehensive physical exam. The patient reports no problems.  History   Social History  . Marital Status: Single    Spouse Name: N/A    Number of Children: 1  . Years of Education: 17   Occupational History  . Not on file.   Social History Main Topics  . Smoking status: Former Smoker    Quit date: 10/03/1990  . Smokeless tobacco: Not on file  . Alcohol Use: 2.5 oz/week    5 drink(s) per week     Comment: Red wine 4 oz 4-5 times a week  . Drug Use: No  . Sexual Activity: Not Currently    Partners: Male   Other Topics Concern  . Not on file   Social History Narrative   Exercise-- jogs   Health Maintenance  Topic Date Due  . Influenza Vaccine  06/03/2013  . Pap Smear  07/03/2014  . Mammogram  10/10/2014  . Tetanus/tdap  12/02/2019  . Colonoscopy  10/30/2021    The following portions of the patient's history were reviewed and updated as appropriate:  She  has a past medical history of Rheumatoid arthritis(714.0); Hypertension; Migraine; Breast lump; Pulmonary nodule, left (2009); Murmur (02/03/2011); Anxiety; and Ovarian failure. She  does not have any pertinent problems on file. She  has past surgical history that includes Cholecystectomy (1999); Tonsillectomy (1969); Exploratory laparotomy; Wisdom tooth extraction; and Colonoscopy (10/31/11). Her family history includes Alcohol abuse in her brother and father; Arthritis in her mother; Cancer in her brother and maternal aunt; Diabetes in her brother and sister; Heart disease in her father and sister; Hyperlipidemia in her father and sister; Hypertension in her brother, mother, and sister; Uterine cancer in her mother and sister. She  reports that she quit smoking about 22 years ago. She does not have any smokeless tobacco history on file. She reports that she drinks about 2.5 ounces of alcohol per week. She reports that she does not use illicit drugs. She  has a current medication list which includes the following prescription(s): cholecalciferol, cyanocobalamin, golimumab, losartan, mometasone, and multivitamin. No current outpatient prescriptions on file prior to visit.   No current facility-administered medications on file prior to visit.   She is allergic to other..  Review of Systems Review of Systems  Constitutional: Negative for activity change, appetite change and fatigue.  HENT: Negative for hearing loss, congestion, tinnitus and ear discharge.  dentist q92m Eyes: Negative for visual disturbance (see optho q1y -- vision corrected to 20/20 with glasses).  Respiratory: Negative for cough, chest tightness and shortness of breath.   Cardiovascular: Negative for chest pain, palpitations and leg swelling.  Gastrointestinal: Negative for abdominal pain, diarrhea, constipation and abdominal distention.  Genitourinary: Negative for urgency, frequency, decreased urine volume and difficulty urinating.  Musculoskeletal: Negative for back pain,  and gait problem.  + arthralgias Skin: Negative for color change, pallor and rash.  Neurological: Negative for dizziness, light-headedness, numbness and headaches.  Hematological: Negative for adenopathy. Does not bruise/bleed easily.  Psychiatric/Behavioral: Negative for suicidal ideas, confusion, sleep disturbance, self-injury, dysphoric mood, decreased concentration and agitation.       Objective:    BP 118/70  Pulse 59  Temp(Src) 98.2 F (36.8 C) (Oral)  Ht 5\' 6"  (1.676 m)  Wt 191 lb (86.637 kg)  BMI 30.84 kg/m2  SpO2 97%  LMP 10/03/2005 General appearance: alert, cooperative, appears stated age and no distress  Head: Normocephalic, without obvious abnormality, atraumatic Eyes: conjunctivae/corneas clear. PERRL, EOM's intact. Fundi benign. Ears: normal TM's and external ear canals both ears Nose: Nares normal. Septum midline. Mucosa normal. No drainage or sinus tenderness. Throat: lips,  mucosa, and tongue normal; teeth and gums normal Neck: no adenopathy, no carotid bruit, no JVD, supple, symmetrical, trachea midline and thyroid not enlarged, symmetric, no tenderness/mass/nodules Back: symmetric, no curvature. ROM normal. No CVA tenderness. Lungs: clear to auscultation bilaterally Breasts: normal appearance, no masses or tenderness Heart: regular rate and rhythm, S1, S2 normal, no murmur, click, rub or gallop Abdomen: soft, non-tender; bowel sounds normal; no masses,  no organomegaly Pelvic: cervix normal in appearance, external genitalia normal, no adnexal masses or tenderness, no cervical motion tenderness, rectovaginal septum normal, uterus normal size, shape, and consistency and vagina normal without discharge Extremities: extremities normal, atraumatic, no cyanosis or edema Pulses: 2+ and symmetric Skin: Skin color, texture, turgor normal. No rashes or lesions Lymph nodes: Cervical, supraclavicular, and axillary nodes normal. Neurologic: Alert and oriented X 3, normal strength and tone. Normal symmetric reflexes. Normal coordination and gait Psych--  No anxiety, no depression      Assessment:    Healthy female exam     Plan:    ghm utd Check labs See After Visit Summary for Counseling Recommendations

## 2013-05-23 NOTE — Assessment & Plan Note (Signed)
Pt restarted on meds by rheum

## 2013-05-23 NOTE — Patient Instructions (Addendum)
Preventive Care for Adults, Female A healthy lifestyle and preventive care can promote health and wellness. Preventive health guidelines for women include the following key practices.  A routine yearly physical is a good way to check with your caregiver about your health and preventive screening. It is a chance to share any concerns and updates on your health, and to receive a thorough exam.  Visit your dentist for a routine exam and preventive care every 6 months. Brush your teeth twice a day and floss once a day. Good oral hygiene prevents tooth decay and gum disease.  The frequency of eye exams is based on your age, health, family medical history, use of contact lenses, and other factors. Follow your caregiver's recommendations for frequency of eye exams.  Eat a healthy diet. Foods like vegetables, fruits, whole grains, low-fat dairy products, and lean protein foods contain the nutrients you need without too many calories. Decrease your intake of foods high in solid fats, added sugars, and salt. Eat the right amount of calories for you.Get information about a proper diet from your caregiver, if necessary.  Regular physical exercise is one of the most important things you can do for your health. Most adults should get at least 150 minutes of moderate-intensity exercise (any activity that increases your heart rate and causes you to sweat) each week. In addition, most adults need muscle-strengthening exercises on 2 or more days a week.  Maintain a healthy weight. The body mass index (BMI) is a screening tool to identify possible weight problems. It provides an estimate of body fat based on height and weight. Your caregiver can help determine your BMI, and can help you achieve or maintain a healthy weight.For adults 20 years and older:  A BMI below 18.5 is considered underweight.  A BMI of 18.5 to 24.9 is normal.  A BMI of 25 to 29.9 is considered overweight.  A BMI of 30 and above is  considered obese.  Maintain normal blood lipids and cholesterol levels by exercising and minimizing your intake of saturated fat. Eat a balanced diet with plenty of fruit and vegetables. Blood tests for lipids and cholesterol should begin at age 20 and be repeated every 5 years. If your lipid or cholesterol levels are high, you are over 50, or you are at high risk for heart disease, you may need your cholesterol levels checked more frequently.Ongoing high lipid and cholesterol levels should be treated with medicines if diet and exercise are not effective.  If you smoke, find out from your caregiver how to quit. If you do not use tobacco, do not start.  If you are pregnant, do not drink alcohol. If you are breastfeeding, be very cautious about drinking alcohol. If you are not pregnant and choose to drink alcohol, do not exceed 1 drink per day. One drink is considered to be 12 ounces (355 mL) of beer, 5 ounces (148 mL) of wine, or 1.5 ounces (44 mL) of liquor.  Avoid use of street drugs. Do not share needles with anyone. Ask for help if you need support or instructions about stopping the use of drugs.  High blood pressure causes heart disease and increases the risk of stroke. Your blood pressure should be checked at least every 1 to 2 years. Ongoing high blood pressure should be treated with medicines if weight loss and exercise are not effective.  If you are 55 to 52 years old, ask your caregiver if you should take aspirin to prevent strokes.  Diabetes   screening involves taking a blood sample to check your fasting blood sugar level. This should be done once every 3 years, after age 45, if you are within normal weight and without risk factors for diabetes. Testing should be considered at a younger age or be carried out more frequently if you are overweight and have at least 1 risk factor for diabetes.  Breast cancer screening is essential preventive care for women. You should practice "breast  self-awareness." This means understanding the normal appearance and feel of your breasts and may include breast self-examination. Any changes detected, no matter how small, should be reported to a caregiver. Women in their 20s and 30s should have a clinical breast exam (CBE) by a caregiver as part of a regular health exam every 1 to 3 years. After age 40, women should have a CBE every year. Starting at age 40, women should consider having a mammography (breast X-ray test) every year. Women who have a family history of breast cancer should talk to their caregiver about genetic screening. Women at a high risk of breast cancer should talk to their caregivers about having magnetic resonance imaging (MRI) and a mammography every year.  The Pap test is a screening test for cervical cancer. A Pap test can show cell changes on the cervix that might become cervical cancer if left untreated. A Pap test is a procedure in which cells are obtained and examined from the lower end of the uterus (cervix).  Women should have a Pap test starting at age 21.  Between ages 21 and 29, Pap tests should be repeated every 2 years.  Beginning at age 30, you should have a Pap test every 3 years as long as the past 3 Pap tests have been normal.  Some women have medical problems that increase the chance of getting cervical cancer. Talk to your caregiver about these problems. It is especially important to talk to your caregiver if a new problem develops soon after your last Pap test. In these cases, your caregiver may recommend more frequent screening and Pap tests.  The above recommendations are the same for women who have or have not gotten the vaccine for human papillomavirus (HPV).  If you had a hysterectomy for a problem that was not cancer or a condition that could lead to cancer, then you no longer need Pap tests. Even if you no longer need a Pap test, a regular exam is a good idea to make sure no other problems are  starting.  If you are between ages 65 and 70, and you have had normal Pap tests going back 10 years, you no longer need Pap tests. Even if you no longer need a Pap test, a regular exam is a good idea to make sure no other problems are starting.  If you have had past treatment for cervical cancer or a condition that could lead to cancer, you need Pap tests and screening for cancer for at least 20 years after your treatment.  If Pap tests have been discontinued, risk factors (such as a new sexual partner) need to be reassessed to determine if screening should be resumed.  The HPV test is an additional test that may be used for cervical cancer screening. The HPV test looks for the virus that can cause the cell changes on the cervix. The cells collected during the Pap test can be tested for HPV. The HPV test could be used to screen women aged 30 years and older, and should   be used in women of any age who have unclear Pap test results. After the age of 30, women should have HPV testing at the same frequency as a Pap test.  Colorectal cancer can be detected and often prevented. Most routine colorectal cancer screening begins at the age of 50 and continues through age 75. However, your caregiver may recommend screening at an earlier age if you have risk factors for colon cancer. On a yearly basis, your caregiver may provide home test kits to check for hidden blood in the stool. Use of a small camera at the end of a tube, to directly examine the colon (sigmoidoscopy or colonoscopy), can detect the earliest forms of colorectal cancer. Talk to your caregiver about this at age 50, when routine screening begins. Direct examination of the colon should be repeated every 5 to 10 years through age 75, unless early forms of pre-cancerous polyps or small growths are found.  Hepatitis C blood testing is recommended for all people born from 1945 through 1965 and any individual with known risks for hepatitis C.  Practice  safe sex. Use condoms and avoid high-risk sexual practices to reduce the spread of sexually transmitted infections (STIs). STIs include gonorrhea, chlamydia, syphilis, trichomonas, herpes, HPV, and human immunodeficiency virus (HIV). Herpes, HIV, and HPV are viral illnesses that have no cure. They can result in disability, cancer, and death. Sexually active women aged 25 and younger should be checked for chlamydia. Older women with new or multiple partners should also be tested for chlamydia. Testing for other STIs is recommended if you are sexually active and at increased risk.  Osteoporosis is a disease in which the bones lose minerals and strength with aging. This can result in serious bone fractures. The risk of osteoporosis can be identified using a bone density scan. Women ages 65 and over and women at risk for fractures or osteoporosis should discuss screening with their caregivers. Ask your caregiver whether you should take a calcium supplement or vitamin D to reduce the rate of osteoporosis.  Menopause can be associated with physical symptoms and risks. Hormone replacement therapy is available to decrease symptoms and risks. You should talk to your caregiver about whether hormone replacement therapy is right for you.  Use sunscreen with sun protection factor (SPF) of 30 or more. Apply sunscreen liberally and repeatedly throughout the day. You should seek shade when your shadow is shorter than you. Protect yourself by wearing long sleeves, pants, a wide-brimmed hat, and sunglasses year round, whenever you are outdoors.  Once a month, do a whole body skin exam, using a mirror to look at the skin on your back. Notify your caregiver of new moles, moles that have irregular borders, moles that are larger than a pencil eraser, or moles that have changed in shape or color.  Stay current with required immunizations.  Influenza. You need a dose every fall (or winter). The composition of the flu vaccine  changes each year, so being vaccinated once is not enough.  Pneumococcal polysaccharide. You need 1 to 2 doses if you smoke cigarettes or if you have certain chronic medical conditions. You need 1 dose at age 65 (or older) if you have never been vaccinated.  Tetanus, diphtheria, pertussis (Tdap, Td). Get 1 dose of Tdap vaccine if you are younger than age 65, are over 65 and have contact with an infant, are a healthcare worker, are pregnant, or simply want to be protected from whooping cough. After that, you need a Td   booster dose every 10 years. Consult your caregiver if you have not had at least 3 tetanus and diphtheria-containing shots sometime in your life or have a deep or dirty wound.  HPV. You need this vaccine if you are a woman age 26 or younger. The vaccine is given in 3 doses over 6 months.  Measles, mumps, rubella (MMR). You need at least 1 dose of MMR if you were born in 1957 or later. You may also need a second dose.  Meningococcal. If you are age 19 to 21 and a first-year college student living in a residence hall, or have one of several medical conditions, you need to get vaccinated against meningococcal disease. You may also need additional booster doses.  Zoster (shingles). If you are age 60 or older, you should get this vaccine.  Varicella (chickenpox). If you have never had chickenpox or you were vaccinated but received only 1 dose, talk to your caregiver to find out if you need this vaccine.  Hepatitis A. You need this vaccine if you have a specific risk factor for hepatitis A virus infection or you simply wish to be protected from this disease. The vaccine is usually given as 2 doses, 6 to 18 months apart.  Hepatitis B. You need this vaccine if you have a specific risk factor for hepatitis B virus infection or you simply wish to be protected from this disease. The vaccine is given in 3 doses, usually over 6 months. Preventive Services / Frequency Ages 19 to 39  Blood  pressure check.** / Every 1 to 2 years.  Lipid and cholesterol check.** / Every 5 years beginning at age 20.  Clinical breast exam.** / Every 3 years for women in their 20s and 30s.  Pap test.** / Every 2 years from ages 21 through 29. Every 3 years starting at age 30 through age 65 or 70 with a history of 3 consecutive normal Pap tests.  HPV screening.** / Every 3 years from ages 30 through ages 65 to 70 with a history of 3 consecutive normal Pap tests.  Hepatitis C blood test.** / For any individual with known risks for hepatitis C.  Skin self-exam. / Monthly.  Influenza immunization.** / Every year.  Pneumococcal polysaccharide immunization.** / 1 to 2 doses if you smoke cigarettes or if you have certain chronic medical conditions.  Tetanus, diphtheria, pertussis (Tdap, Td) immunization. / A one-time dose of Tdap vaccine. After that, you need a Td booster dose every 10 years.  HPV immunization. / 3 doses over 6 months, if you are 26 and younger.  Measles, mumps, rubella (MMR) immunization. / You need at least 1 dose of MMR if you were born in 1957 or later. You may also need a second dose.  Meningococcal immunization. / 1 dose if you are age 19 to 21 and a first-year college student living in a residence hall, or have one of several medical conditions, you need to get vaccinated against meningococcal disease. You may also need additional booster doses.  Varicella immunization.** / Consult your caregiver.  Hepatitis A immunization.** / Consult your caregiver. 2 doses, 6 to 18 months apart.  Hepatitis B immunization.** / Consult your caregiver. 3 doses usually over 6 months. Ages 40 to 64  Blood pressure check.** / Every 1 to 2 years.  Lipid and cholesterol check.** / Every 5 years beginning at age 20.  Clinical breast exam.** / Every year after age 40.  Mammogram.** / Every year beginning at age 40   and continuing for as long as you are in good health. Consult with your  caregiver.  Pap test.** / Every 3 years starting at age 30 through age 65 or 70 with a history of 3 consecutive normal Pap tests.  HPV screening.** / Every 3 years from ages 30 through ages 65 to 70 with a history of 3 consecutive normal Pap tests.  Fecal occult blood test (FOBT) of stool. / Every year beginning at age 50 and continuing until age 75. You may not need to do this test if you get a colonoscopy every 10 years.  Flexible sigmoidoscopy or colonoscopy.** / Every 5 years for a flexible sigmoidoscopy or every 10 years for a colonoscopy beginning at age 50 and continuing until age 75.  Hepatitis C blood test.** / For all people born from 1945 through 1965 and any individual with known risks for hepatitis C.  Skin self-exam. / Monthly.  Influenza immunization.** / Every year.  Pneumococcal polysaccharide immunization.** / 1 to 2 doses if you smoke cigarettes or if you have certain chronic medical conditions.  Tetanus, diphtheria, pertussis (Tdap, Td) immunization.** / A one-time dose of Tdap vaccine. After that, you need a Td booster dose every 10 years.  Measles, mumps, rubella (MMR) immunization. / You need at least 1 dose of MMR if you were born in 1957 or later. You may also need a second dose.  Varicella immunization.** / Consult your caregiver.  Meningococcal immunization.** / Consult your caregiver.  Hepatitis A immunization.** / Consult your caregiver. 2 doses, 6 to 18 months apart.  Hepatitis B immunization.** / Consult your caregiver. 3 doses, usually over 6 months. Ages 65 and over  Blood pressure check.** / Every 1 to 2 years.  Lipid and cholesterol check.** / Every 5 years beginning at age 20.  Clinical breast exam.** / Every year after age 40.  Mammogram.** / Every year beginning at age 40 and continuing for as long as you are in good health. Consult with your caregiver.  Pap test.** / Every 3 years starting at age 30 through age 65 or 70 with a 3  consecutive normal Pap tests. Testing can be stopped between 65 and 70 with 3 consecutive normal Pap tests and no abnormal Pap or HPV tests in the past 10 years.  HPV screening.** / Every 3 years from ages 30 through ages 65 or 70 with a history of 3 consecutive normal Pap tests. Testing can be stopped between 65 and 70 with 3 consecutive normal Pap tests and no abnormal Pap or HPV tests in the past 10 years.  Fecal occult blood test (FOBT) of stool. / Every year beginning at age 50 and continuing until age 75. You may not need to do this test if you get a colonoscopy every 10 years.  Flexible sigmoidoscopy or colonoscopy.** / Every 5 years for a flexible sigmoidoscopy or every 10 years for a colonoscopy beginning at age 50 and continuing until age 75.  Hepatitis C blood test.** / For all people born from 1945 through 1965 and any individual with known risks for hepatitis C.  Osteoporosis screening.** / A one-time screening for women ages 65 and over and women at risk for fractures or osteoporosis.  Skin self-exam. / Monthly.  Influenza immunization.** / Every year.  Pneumococcal polysaccharide immunization.** / 1 dose at age 65 (or older) if you have never been vaccinated.  Tetanus, diphtheria, pertussis (Tdap, Td) immunization. / A one-time dose of Tdap vaccine if you are over   65 and have contact with an infant, are a healthcare worker, or simply want to be protected from whooping cough. After that, you need a Td booster dose every 10 years.  Varicella immunization.** / Consult your caregiver.  Meningococcal immunization.** / Consult your caregiver.  Hepatitis A immunization.** / Consult your caregiver. 2 doses, 6 to 18 months apart.  Hepatitis B immunization.** / Check with your caregiver. 3 doses, usually over 6 months. ** Family history and personal history of risk and conditions may change your caregiver's recommendations. Document Released: 11/15/2001 Document Revised: 12/12/2011  Document Reviewed: 02/14/2011 [employer] Patient Information 2014 [employer].  

## 2013-05-27 ENCOUNTER — Telehealth: Payer: Self-pay | Admitting: Family Medicine

## 2013-06-05 NOTE — Telephone Encounter (Signed)
Encounter opened in error

## 2013-07-30 ENCOUNTER — Ambulatory Visit (INDEPENDENT_AMBULATORY_CARE_PROVIDER_SITE_OTHER): Payer: 59 | Admitting: Family Medicine

## 2013-07-30 ENCOUNTER — Encounter: Payer: Self-pay | Admitting: Family Medicine

## 2013-07-30 VITALS — BP 116/64 | HR 68 | Temp 98.5°F | Wt 197.0 lb

## 2013-07-30 DIAGNOSIS — M62838 Other muscle spasm: Secondary | ICD-10-CM

## 2013-07-30 MED ORDER — CYCLOBENZAPRINE HCL 10 MG PO TABS: 10.0000 mg | ORAL_TABLET | Freq: Three times a day (TID) | ORAL | Status: DC | PRN

## 2013-07-30 NOTE — Progress Notes (Signed)
  Subjective:     Sharon Hunter is a 52 y.o. female who presents for evaluation of neck pain. Event that precipitated these symptoms: weight lifting. Onset of symptoms was 7 days ago, and have been gradually worsening since that time. Current symptoms are numbness in L arm on occassion and pain in L trap and neck (sharp, throbbing and tingling in character; 6/10 in severity). Patient denies weakness in L arm. Patient has had no prior neck problems. Previous treatments: nsaids and massage, which helped some.  The following portions of the patient's history were reviewed and updated as appropriate: allergies, current medications, past family history, past medical history, past social history, past surgical history and problem list.  Review of Systems Pertinent items are noted in HPI.    Objective:    BP 116/64  Pulse 68  Temp(Src) 98.5 F (36.9 C) (Oral)  Wt 197 lb (89.359 kg)  BMI 31.81 kg/m2  SpO2 98%  LMP 10/03/2005 General:   alert, cooperative, appears stated age and no distress  External Deformity:  absent  ROM Cervical Spine:  normal range of motion and left rotation to 45 degrees  Midline Tenderness:  absent b/l  Paraspinous tenderness:  mild on the left  UE Neurologic Exam:  unremarkable, normal strength, normal sensation   X-ray of the cervical spine: Not indicated    Assessment:    Cervical strain    Plan:    Agricultural engineer distributed. Discussed appropriate use of ice and heat. NSAIDs per medication orders. Muscle relaxants started per medication orders. Follow up in  2 weeks. ---or sooner prn  Offered pt soft collar-- pt preferred not to use it

## 2013-07-30 NOTE — Patient Instructions (Signed)

## 2013-08-08 ENCOUNTER — Other Ambulatory Visit: Payer: Self-pay

## 2013-11-13 ENCOUNTER — Ambulatory Visit (INDEPENDENT_AMBULATORY_CARE_PROVIDER_SITE_OTHER): Payer: 59 | Admitting: Nurse Practitioner

## 2013-11-13 ENCOUNTER — Encounter: Payer: Self-pay | Admitting: Nurse Practitioner

## 2013-11-13 VITALS — BP 145/86 | HR 69 | Temp 98.9°F | Ht 66.0 in | Wt 192.0 lb

## 2013-11-13 DIAGNOSIS — J111 Influenza due to unidentified influenza virus with other respiratory manifestations: Secondary | ICD-10-CM

## 2013-11-13 MED ORDER — OSELTAMIVIR PHOSPHATE 75 MG PO CAPS: 75.0000 mg | ORAL_CAPSULE | Freq: Two times a day (BID) | ORAL | Status: DC

## 2013-11-13 NOTE — Progress Notes (Signed)
Pre-visit discussion using our clinic review tool. No additional management support is needed unless otherwise documented below in the visit note.  

## 2013-11-13 NOTE — Progress Notes (Signed)
   Subjective:    Patient ID: Sharon Hunter, female    DOB: [birth year], 53 y.o.   MRN: [id number]  URI  This is a new problem. The current episode started yesterday. The problem has been gradually worsening. There has been no fever. Associated symptoms include coughing, headaches and neck pain. Pertinent negatives include no abdominal pain, chest pain, congestion, diarrhea, nausea, plugged ear sensation, sinus pain, sneezing, sore throat, vomiting or wheezing. Associated symptoms comments: Pt just had symponi infusion-compromises ability to mount immune response (fever).. Treatments tried: otc cough med. The treatment provided mild relief.      Review of Systems  Constitutional: Positive for chills and fatigue. Negative for fever and appetite change.  HENT: Negative for congestion, sneezing and sore throat.   Respiratory: Positive for cough. Negative for chest tightness, shortness of breath and wheezing.   Cardiovascular: Negative for chest pain.  Gastrointestinal: Negative for nausea, vomiting, abdominal pain and diarrhea.  Musculoskeletal: Positive for neck pain.  Neurological: Positive for headaches.       Objective:   Physical Exam  Vitals reviewed. Constitutional: She is oriented to person, place, and time. She appears well-developed and well-nourished. No distress.  HENT:  Head: Normocephalic and atraumatic.  Right Ear: External ear normal.  Left Ear: External ear normal.  Mouth/Throat: No oropharyngeal exudate.  Erythema posterior pharynx  Eyes: Conjunctivae are normal. Right eye exhibits no discharge. Left eye exhibits no discharge.  Neck: Normal range of motion. Neck supple. No thyromegaly present.  Cardiovascular: Normal rate, regular rhythm and normal heart sounds.   No murmur heard. Pulmonary/Chest: Effort normal and breath sounds normal. No respiratory distress. She has no wheezes. She has no rales.  Lymphadenopathy:    She has no cervical adenopathy.  Neurological:  She is alert and oriented to person, place, and time.  Skin: Skin is warm and dry.  Psychiatric: She has a normal mood and affect. Her behavior is normal. Thought content normal.          Assessment & Plan:  1. Influenza sudden onset, cough, chills. 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 - oseltamivir (TAMIFLU) 75 MG capsule; Take 1 capsule (75 mg total) by mouth 2 (two) times daily. If capsule cannot be broken, substitute with 12mg /ml suspension: take 6.25 mls BID X 5 days.  Dispense: 10 capsule; Refill: 0  See pt instructions.

## 2013-11-13 NOTE — Patient Instructions (Signed)
You likely have flu. Start tamiflu.The average duration is 5-10 days. Treatment is largely symptom management. For sinus congestion, start daily sinus rinses (neilmed Sinus Rinse) & 30 mg to 60 mg pseudoephedrine twice daily. For sore throat use benzocaine throat lozenges or spray. For aches & fever alternate tylenol & ibuprophen every 4-6 hours. For cough, you may use a spoonful of honey thinned with lemon juice or hot tea. Sip fluids every hour. Rest. If you are not feeling better in 1 week or develop fever or chest pain, call us for re-evaluation. In the future, postpone symponi at least 5 days after developing cough or cold symptoms. Feel better!    Influenza A (H1N1) H1N1 formerly called "swine flu" is a new influenza virus causing sickness in people. The H1N1 virus is different from seasonal influenza viruses. However, the H1N1 symptoms are similar to seasonal influenza and it is spread from person to person. You may be at higher risk for serious problems if you have underlying serious medical conditions. The CDC and the [facility] are following reported cases around the world. CAUSES   The flu is thought to spread mainly person-to-person through coughing or sneezing of infected people.  A person may become infected by touching something with the virus on it and then touching their mouth or nose. SYMPTOMS   Fever.  Headache.  Tiredness.  Cough.  Sore throat.  Runny or stuffy nose.  Body aches.  Diarrhea and vomiting These symptoms are referred to as "flu-like symptoms." A lot of different illnesses, including the common cold, may have similar symptoms. DIAGNOSIS   There are tests that can tell if you have the H1N1 virus.  Confirmed cases of H1N1 will be reported to the state or local health department.  A doctor's exam may be needed to tell whether you have an infection that is a complication of the flu. HOME CARE INSTRUCTIONS   Stay informed. Visit the [facility]  website for current recommendations. Visit [employer]. You may also call 1-800-CDC-INFO 917 257 8502).  Get help early if you develop any of the above symptoms.  If you are at high risk from complications of the flu, talk to your caregiver as soon as you develop flu-like symptoms. Those at higher risk for complications include:  People 65 years or older.  People with chronic medical conditions.  Pregnant women.  Young children.  Your caregiver may recommend antiviral medicine to help treat the flu.  If you get the flu, get plenty of rest, drink enough water and fluids to keep your urine clear or pale yellow, and avoid using alcohol or tobacco.  You may take over-the-counter medicine to relieve the symptoms of the flu if your caregiver approves. (Never give aspirin to children or teenagers who have flu-like symptoms, particularly fever). TREATMENT  If you do get sick, antiviral drugs are available. These drugs can make your illness milder and make you feel better faster. Treatment should start soon after illness starts. It is only effective if taken within the first day of becoming ill. Only your caregiver can prescribe antiviral medication.  PREVENTION   Cover your nose and mouth with a tissue or your arm when you cough or sneeze. Throw the tissue away.  Wash your hands often with soap and warm water, especially after you cough or sneeze. Alcohol-based cleaners are also effective against germs.  Avoid touching your eyes, nose or mouth. This is one way germs spread.  Try to avoid contact with sick people. Follow public  health advice regarding school closures. Avoid crowds.  Stay home if you get sick. Limit contact with others to keep from infecting them. People infected with the H1N1 virus may be able to infect others anywhere from 1 day before feeling sick to 5-7 days after getting flu symptoms.  An H1N1 vaccine is available to help protect against the virus. In addition  to the H1N1 vaccine, you will need to be vaccinated for seasonal influenza. The H1N1 and seasonal vaccines may be given on the same day. The CDC especially recommends the H1N1 vaccine for:  Pregnant women.  People who live with or care for children younger than 62 months of age.  Health care and emergency services personnel.  Persons between the ages of 62 months through 75 years of age.  People from ages 81 through 68 years who are at higher risk for H1N1 because of chronic health disorders or immune system problems. FACEMASKS In community and home settings, the use of facemasks and N95 respirators are not normally recommended. In certain circumstances, a facemask or N95 respirator may be used for persons at increased risk of severe illness from influenza. Your caregiver can give additional recommendations for facemask use. IN CHILDREN, EMERGENCY WARNING SIGNS THAT NEED URGENT MEDICAL CARE:  Fast breathing or trouble breathing.  Bluish skin color.  Not drinking enough fluids.  Not waking up or not interacting normally.  Being so fussy that the child does not want to be held.  Your child has an oral temperature above 102 F (38.9 C), not controlled by medicine.  Your baby is older than 3 months with a rectal temperature of 102 F (38.9 C) or higher.  Your baby is 34 months old or younger with a rectal temperature of 100.4 F (38 C) or higher.  Flu-like symptoms improve but then return with fever and worse cough. IN ADULTS, EMERGENCY WARNING SIGNS THAT NEED URGENT MEDICAL CARE:  Difficulty breathing or shortness of breath.  Pain or pressure in the chest or abdomen.  Sudden dizziness.  Confusion.  Severe or persistent vomiting.  Bluish color.  You have a oral temperature above 102 F (38.9 C), not controlled by medicine.  Flu-like symptoms improve but return with fever and worse cough. SEEK IMMEDIATE MEDICAL CARE IF:  You or someone you know is experiencing any of the  above symptoms. When you arrive at the emergency center, report that you think you have the flu. You may be asked to wear a mask and/or sit in a secluded area to protect others from getting sick. MAKE SURE YOU:   Understand these instructions.  Will watch your condition.  Will get help right away if you are not doing well or get worse. Some of this information courtesy of the CDC.  Document Released: 03/07/2008 Document Revised: 12/12/2011 Document Reviewed: 03/07/2008 [facility] Patient Information 2014 [place], [place].

## 2014-04-28 ENCOUNTER — Encounter: Payer: Self-pay | Admitting: Family Medicine

## 2014-04-28 ENCOUNTER — Ambulatory Visit (INDEPENDENT_AMBULATORY_CARE_PROVIDER_SITE_OTHER): Payer: 59 | Admitting: Family Medicine

## 2014-04-28 VITALS — BP 116/80 | HR 54 | Temp 98.0°F | Wt 185.0 lb

## 2014-04-28 DIAGNOSIS — N951 Menopausal and female climacteric states: Secondary | ICD-10-CM

## 2014-04-28 MED ORDER — ESTROGENS, CONJUGATED 0.625 MG/GM VA CREA: 1.0000 | TOPICAL_CREAM | Freq: Every day | VAGINAL | Status: DC

## 2014-04-28 NOTE — Patient Instructions (Signed)
Conjugated Estrogens vaginal cream What is this medicine? CONJUGATED ESTROGENS (CON ju gate ed ESS troe jenz) are a mixture of female hormones. This cream can help relieve symptoms associated with menopause.like vaginal dryness and irritation. This medicine may be used for other purposes; ask your health care provider or pharmacist if you have questions. COMMON BRAND NAME(S): Premarin What should I tell my health care provider before I take this medicine? They need to know if you have any of these conditions: -abnormal vaginal bleeding -blood vessel disease or blood clots -breast, cervical, endometrial, or uterine cancer -dementia -diabetes -gallbladder disease -heart disease or recent heart attack -high blood pressure -high cholesterol -high level of calcium in the blood -hysterectomy -kidney disease -liver disease -migraine headaches -protein C deficiency -protein S deficiency -stroke -systemic lupus erythematosus (SLE) -tobacco smoker -an unusual or allergic reaction to estrogens other medicines, foods, dyes, or preservatives -pregnant or trying to get pregnant -breast-feeding How should I use this medicine? This medicine is for use in the vagina only. Do not take by mouth. Follow the directions on the prescription label. Use at bedtime unless otherwise directed by your doctor or health care professional. Use the special applicator supplied with the cream. Wash hands before and after use. Fill the applicator with the cream and remove from the tube. Lie on your back, part and bend your knees. Insert the applicator into the vagina and push the plunger to expel the cream into the vagina. Wash the applicator with warm soapy water and rinse well. Use exactly as directed for the complete length of time prescribed. Do not stop using except on the advice of your doctor or health care professional. Talk to your pediatrician regarding the use of this medicine in children. Special care may be  needed. A patient package insert for the product will be given with each prescription and refill. Read this sheet carefully each time. The sheet may change frequently. Overdosage: If you think you have taken too much of this medicine contact a poison control center or emergency room at once. NOTE: This medicine is only for you. Do not share this medicine with others. What if I miss a dose? If you miss a dose, use it as soon as you can. If it is almost time for your next dose, use only that dose. Do not use double or extra doses. What may interact with this medicine? Do not take this medicine with any of the following medications: -aromatase inhibitors like aminoglutethimide, anastrozole, exemestane, letrozole, testolactone This medicine may also interact with the following medications: -barbiturates used for inducing sleep or treating seizures -carbamazepine -grapefruit juice -medicines for fungal infections like itraconazole and ketoconazole -raloxifene or tamoxifen -rifabutin -rifampin -rifapentine -ritonavir -some antibiotics used to treat infections -St. John's Wort -warfarin This list may not describe all possible interactions. Give your health care provider a list of all the medicines, herbs, non-prescription drugs, or dietary supplements you use. Also tell them if you smoke, drink alcohol, or use illegal drugs. Some items may interact with your medicine. What should I watch for while using this medicine? Visit your health care professional for regular checks on your progress. You will need a regular breast and pelvic exam. You should also discuss the need for regular mammograms with your health care professional, and follow his or her guidelines. This medicine can make your body retain fluid, making your fingers, hands, or ankles swell. Your blood pressure can go up. Contact your doctor or health care professional if you  feel you are retaining fluid. If you have any reason to think  you are pregnant; stop taking this medicine at once and contact your doctor or health care professional. Tobacco smoking increases the risk of getting a blood clot or having a stroke, especially if you are more than 53 years old. You are strongly advised not to smoke. If you wear contact lenses and notice visual changes, or if the lenses begin to feel uncomfortable, consult your eye care specialist. If you are going to have elective surgery, you may need to stop taking this medicine beforehand. Consult your health care professional for advice prior to scheduling the surgery. What side effects may I notice from receiving this medicine? Side effects that you should report to your doctor or health care professional as soon as possible: -allergic reactions like skin rash, itching or hives, swelling of the face, lips, or tongue -breast tissue changes or discharge -changes in vision -chest pain -confusion, trouble speaking or understanding -dark urine -general ill feeling or flu-like symptoms -light-colored stools -nausea, vomiting -pain, swelling, warmth in the leg -right upper belly pain -severe headaches -shortness of breath -sudden numbness or weakness of the face, arm or leg -trouble walking, dizziness, loss of balance or coordination -unusual vaginal bleeding -yellowing of the eyes or skin Side effects that usually do not require medical attention (report to your doctor or health care professional if they continue or are bothersome): -hair loss -increased hunger or thirst -increased urination -symptoms of vaginal infection like itching, irritation or unusual discharge -unusually weak or tired This list may not describe all possible side effects. Call your doctor for medical advice about side effects. You may report side effects to FDA at 1-800-FDA-1088. Where should I keep my medicine? Keep out of the reach of children. Store at room temperature between 15 and 30 degrees C (59 and 86  degrees F). Throw away any unused medicine after the expiration date. NOTE: This sheet is a summary. It may not cover all possible information. If you have questions about this medicine, talk to your doctor, pharmacist, or health care provider.  2015, [employer]. (2010-12-22 09:20:36)  

## 2014-04-28 NOTE — Progress Notes (Signed)
   Subjective:    Patient ID: Sharon Hunter, female    DOB: January 26, 1961, 53 y.o.   MRN: [id number]  HPI Pt here c/o vaginal dryness and in hoping to get premarin cream for this.  She was on HRT for years for early menopause and has been off of it for about 5 years now.  She is c/o painful intercourse also and has tried various lubricants which made it worse or caused vaginitis.  Pt now has a new partner and is scare to have intercourse because of the pain.      Review of Systems As above    Objective:   Physical Exam BP 116/80  Pulse 54  Temp(Src) 98 F (36.7 C) (Oral)  Wt 185 lb (83.915 kg)  SpO2 98%  LMP 10/03/2005 General appearance: alert, cooperative, appears stated age and no distress Neurologic: Alert and oriented X 3, normal strength and tone. Normal symmetric reflexes. Normal coordination and gait        Assessment & Plan:  1. Menopausal vaginal dryness rto prn - conjugated estrogens (PREMARIN) vaginal cream; Place 1 Applicatorful vaginally daily.  Dispense: 42.5 g; Refill: 12

## 2014-04-28 NOTE — Progress Notes (Signed)
Pre visit review using our clinic review tool, if applicable. No additional management support is needed unless otherwise documented below in the visit note. 

## 2014-07-07 ENCOUNTER — Other Ambulatory Visit: Payer: Self-pay

## 2014-07-07 DIAGNOSIS — Z1239 Encounter for other screening for malignant neoplasm of breast: Secondary | ICD-10-CM

## 2014-07-08 ENCOUNTER — Ambulatory Visit
Admission: RE | Admit: 2014-07-08 | Discharge: 2014-07-08 | Disposition: A | Discharge status: Home / Self Care | Payer: 59 | Source: Ambulatory Visit

## 2014-07-08 DIAGNOSIS — Z1239 Encounter for other screening for malignant neoplasm of breast: Secondary | ICD-10-CM

## 2014-07-15 ENCOUNTER — Other Ambulatory Visit: Payer: Self-pay | Admitting: Cardiovascular Disease

## 2014-07-15 MED ORDER — CEPHALEXIN 500 MG PO CAPS: 500.0000 mg | ORAL_CAPSULE | Freq: Two times a day (BID) | ORAL | Status: DC

## 2014-07-15 NOTE — Progress Notes (Signed)
Jodette presents with several bug bites on her left lower leg On exam she has 2 bites with surrounding erythemia.  The site is warm to the touch. Will give her Keflex 500 BID for 5 days. She will call for further problems   Thayer Headings, Brooke Bonito., MD, [facility] 07/15/2014, 1:15 PM [address]  [place] Pager [phone]

## 2014-09-29 ENCOUNTER — Encounter: Payer: Self-pay | Admitting: Family Medicine

## 2014-09-29 ENCOUNTER — Ambulatory Visit (INDEPENDENT_AMBULATORY_CARE_PROVIDER_SITE_OTHER): Payer: 59 | Admitting: Family Medicine

## 2014-09-29 ENCOUNTER — Other Ambulatory Visit (HOSPITAL_COMMUNITY)
Admission: RE | Admit: 2014-09-29 | Discharge: 2014-09-29 | Disposition: A | Discharge status: Home / Self Care | Payer: 59 | Source: Ambulatory Visit | Attending: Family Medicine | Admitting: Family Medicine

## 2014-09-29 VITALS — BP 116/60 | HR 55 | Temp 98.0°F | Wt 191.2 lb

## 2014-09-29 DIAGNOSIS — B373 Candidiasis of vulva and vagina: Secondary | ICD-10-CM

## 2014-09-29 DIAGNOSIS — N760 Acute vaginitis: Secondary | ICD-10-CM | POA: Insufficient documentation

## 2014-09-29 DIAGNOSIS — N9489 Other specified conditions associated with female genital organs and menstrual cycle: Secondary | ICD-10-CM

## 2014-09-29 DIAGNOSIS — R82998 Other abnormal findings in urine: Secondary | ICD-10-CM

## 2014-09-29 DIAGNOSIS — N390 Urinary tract infection, site not specified: Secondary | ICD-10-CM

## 2014-09-29 DIAGNOSIS — B3731 Acute candidiasis of vulva and vagina: Secondary | ICD-10-CM

## 2014-09-29 DIAGNOSIS — R102 Pelvic and perineal pain: Secondary | ICD-10-CM

## 2014-09-29 LAB — POCT URINALYSIS DIPSTICK
BILIRUBIN UA: NEGATIVE
Blood, UA: NEGATIVE
Glucose, UA: NEGATIVE
Ketones, UA: NEGATIVE
NITRITE UA: NEGATIVE
PH UA: 6.5
PROTEIN UA: NEGATIVE
Spec Grav, UA: 1.015
UROBILINOGEN UA: 4

## 2014-09-29 MED ORDER — FLUCONAZOLE 150 MG PO TABS: 150.0000 mg | ORAL_TABLET | Freq: Once | ORAL | Status: DC

## 2014-09-29 NOTE — Progress Notes (Signed)
Pre visit review using our clinic review tool, if applicable. No additional management support is needed unless otherwise documented below in the visit note. 

## 2014-09-29 NOTE — Patient Instructions (Signed)

## 2014-09-29 NOTE — Progress Notes (Signed)
   Subjective:    Patient ID: Sharon Hunter, female    DOB: 1961-02-23, 53 y.o.   MRN: [id number]  HPI Pt here c/o vaginal d/c x few weeks.  She was on keflex for cellulitis for bug bite a while back.   She then developed a yeast infection and took two rounds of monostat-- it still has not resolved.  No odor.  No pelvic pain.  No chance of pregnancy.     Review of Systems  Constitutional: Negative for fever, chills, diaphoresis, activity change, appetite change, fatigue and unexpected weight change.  Gastrointestinal: Negative for nausea, vomiting, abdominal pain, diarrhea and abdominal distention.  Genitourinary: Positive for vaginal discharge. Negative for dysuria, frequency, flank pain, difficulty urinating, vaginal pain, pelvic pain and dyspareunia.       Objective:   Physical Exam BP 116/60 mmHg  Pulse 55  Temp(Src) 98 F (36.7 C) (Oral)  Wt 191 lb 3.2 oz (86.728 kg)  SpO2 99%  LMP 10/03/2005 General appearance: alert, cooperative, appears stated age and no distress Abdomen: soft, non-tender; bowel sounds normal; no masses,  no organomegaly Pelvic: external genitalia normal and positive findings: vaginal discharge:  copious, white, thin and odorless Extremities: extremities normal, atraumatic, no cyanosis or edema       Assessment & Plan:  1. Pelvic pressure in female - POCT Urinalysis Dipstick  2. Leukocytes in urine  - Urine Culture  3. Vaginal yeast infection  - fluconazole (DIFLUCAN) 150 MG tablet; Take 1 tablet (150 mg total) by mouth once. May repeat in 3 days prn  Dispense: 2 tablet; Refill: 0 - Cervicovaginal ancillary only

## 2014-09-30 LAB — CERVICOVAGINAL ANCILLARY ONLY
WET PREP (BD AFFIRM): NEGATIVE
Wet Prep (BD Affirm): NEGATIVE
Wet Prep (BD Affirm): NEGATIVE

## 2014-10-01 LAB — URINE CULTURE
COLONY COUNT: NO GROWTH
Organism ID, Bacteria: NO GROWTH

## 2014-10-10 ENCOUNTER — Other Ambulatory Visit: Payer: Self-pay

## 2014-10-10 ENCOUNTER — Encounter: Payer: Self-pay | Admitting: Family Medicine

## 2014-10-10 ENCOUNTER — Telehealth: Payer: Self-pay | Admitting: Family Medicine

## 2014-10-10 DIAGNOSIS — B379 Candidiasis, unspecified: Secondary | ICD-10-CM

## 2014-10-10 MED ORDER — FLUCONAZOLE 150 MG PO TABS: 150.0000 mg | ORAL_TABLET | ORAL | Status: DC

## 2014-10-10 MED ORDER — CLOTRIMAZOLE 100 MG VA TABS: 1.0000 | ORAL_TABLET | Freq: Every day | VAGINAL | Status: DC

## 2014-10-10 NOTE — Telephone Encounter (Signed)
Caller name:susan at [facility] Relation to pt: Call back number: [phone] Pharmacy:  Reason for call:   Please call [facility] regarding Clotrimazole 100 MG TABS.   [facility] states that they do not make clotrimazole tablets.

## 2014-10-10 NOTE — Telephone Encounter (Signed)
Clotrimazole vaginal 100 mg vaginal tablet into the vagina qhs x 7 days If no better we will refer to gyn

## 2014-10-10 NOTE — Telephone Encounter (Signed)
Diflucan 150 mg#4  1 po qweek ,  5 refills Refer to gyn since d/c keeps returning

## 2014-10-10 NOTE — Telephone Encounter (Signed)
Rx faxed and message left for the patient.     KP

## 2014-10-10 NOTE — Telephone Encounter (Signed)
This medication is no longer manufactured. Please advise on alternate      KP

## 2014-10-17 ENCOUNTER — Encounter (HOSPITAL_COMMUNITY): Payer: 59

## 2014-12-14 ENCOUNTER — Ambulatory Visit (INDEPENDENT_AMBULATORY_CARE_PROVIDER_SITE_OTHER): Payer: 59 | Admitting: Emergency Medicine

## 2014-12-14 ENCOUNTER — Ambulatory Visit (INDEPENDENT_AMBULATORY_CARE_PROVIDER_SITE_OTHER): Payer: 59

## 2014-12-14 ENCOUNTER — Ambulatory Visit: Payer: Self-pay

## 2014-12-14 VITALS — BP 140/84 | HR 69 | Temp 98.1°F | Resp 19 | Ht 66.25 in | Wt 193.0 lb

## 2014-12-14 DIAGNOSIS — S99912A Unspecified injury of left ankle, initial encounter: Secondary | ICD-10-CM

## 2014-12-14 DIAGNOSIS — M25572 Pain in left ankle and joints of left foot: Secondary | ICD-10-CM

## 2014-12-14 DIAGNOSIS — M25472 Effusion, left ankle: Secondary | ICD-10-CM | POA: Diagnosis not present

## 2014-12-14 DIAGNOSIS — M79671 Pain in right foot: Secondary | ICD-10-CM

## 2014-12-14 MED ORDER — LOSARTAN POTASSIUM 50 MG PO TABS: ORAL_TABLET | ORAL | Status: DC

## 2014-12-14 NOTE — Patient Instructions (Signed)
Hypertension Hypertension, commonly called high blood pressure, is when the force of blood pumping through your arteries is too strong. Your arteries are the blood vessels that carry blood from your heart throughout your body. A blood pressure reading consists of a higher number over a lower number, such as 110/72. The higher number (systolic) is the pressure inside your arteries when your heart pumps. The lower number (diastolic) is the pressure inside your arteries when your heart relaxes. Ideally you want your blood pressure below 120/80. Hypertension forces your heart to work harder to pump blood. Your arteries may become narrow or stiff. Having hypertension puts you at risk for heart disease, stroke, and other problems.  RISK FACTORS Some risk factors for high blood pressure are controllable. Others are not.  Risk factors you cannot control include:   Race. You may be at higher risk if you are African American.  Age. Risk increases with age.  Gender. Men are at higher risk than women before age 45 years. After age 65, women are at higher risk than men. Risk factors you can control include:  Not getting enough exercise or physical activity.  Being overweight.  Getting too much fat, sugar, calories, or salt in your diet.  Drinking too much alcohol. SIGNS AND SYMPTOMS Hypertension does not usually cause signs or symptoms. Extremely high blood pressure (hypertensive crisis) may cause headache, anxiety, shortness of breath, and nosebleed. DIAGNOSIS  To check if you have hypertension, your health care provider will measure your blood pressure while you are seated, with your arm held at the level of your heart. It should be measured at least twice using the same arm. Certain conditions can cause a difference in blood pressure between your right and left arms. A blood pressure reading that is higher than normal on one occasion does not mean that you need treatment. If one blood pressure reading  is high, ask your health care provider about having it checked again. TREATMENT  Treating high blood pressure includes making lifestyle changes and possibly taking medicine. Living a healthy lifestyle can help lower high blood pressure. You may need to change some of your habits. Lifestyle changes may include:  Following the DASH diet. This diet is high in fruits, vegetables, and whole grains. It is low in salt, red meat, and added sugars.  Getting at least 2 hours of brisk physical activity every week.  Losing weight if necessary.  Not smoking.  Limiting alcoholic beverages.  Learning ways to reduce stress. If lifestyle changes are not enough to get your blood pressure under control, your health care provider may prescribe medicine. You may need to take more than one. Work closely with your health care provider to understand the risks and benefits. HOME CARE INSTRUCTIONS  Have your blood pressure rechecked as directed by your health care provider.   Take medicines only as directed by your health care provider. Follow the directions carefully. Blood pressure medicines must be taken as prescribed. The medicine does not work as well when you skip doses. Skipping doses also puts you at risk for problems.   Do not smoke.   Monitor your blood pressure at home as directed by your health care provider. SEEK MEDICAL CARE IF:   You think you are having a reaction to medicines taken.  You have recurrent headaches or feel dizzy.  You have swelling in your ankles.  You have trouble with your vision. SEEK IMMEDIATE MEDICAL CARE IF:  You develop a severe headache or confusion.    You have unusual weakness, numbness, or feel faint.  You have severe chest or abdominal pain.  You vomit repeatedly.  You have trouble breathing. MAKE SURE YOU:   Understand these instructions.  Will watch your condition.  Will get help right away if you are not doing well or get worse. Document  Released: 09/19/2005 Document Revised: 02/03/2014 Document Reviewed: 07/12/2013 [facility] Patient Information 2015 [place], [place]. This information is not intended to replace advice given to you by your health care provider. Make sure you discuss any questions you have with your health care provider. Ankle Sprain An ankle sprain is an injury to the strong, fibrous tissues (ligaments) that hold the bones of your ankle joint together.  CAUSES An ankle sprain is usually caused by a fall or by twisting your ankle. Ankle sprains most commonly occur when you step on the outer edge of your foot, and your ankle turns inward. People who participate in sports are more prone to these types of injuries.  SYMPTOMS   Pain in your ankle. The pain may be present at rest or only when you are trying to stand or walk.  Swelling.  Bruising. Bruising may develop immediately or within 1 to 2 days after your injury.  Difficulty standing or walking, particularly when turning corners or changing directions. DIAGNOSIS  Your caregiver will ask you details about your injury and perform a physical exam of your ankle to determine if you have an ankle sprain. During the physical exam, your caregiver will press on and apply pressure to specific areas of your foot and ankle. Your caregiver will try to move your ankle in certain ways. An X-ray exam may be done to be sure a bone was not broken or a ligament did not separate from one of the bones in your ankle (avulsion fracture).  TREATMENT  Certain types of braces can help stabilize your ankle. Your caregiver can make a recommendation for this. Your caregiver may recommend the use of medicine for pain. If your sprain is severe, your caregiver may refer you to a surgeon who helps to restore function to parts of your skeletal system (orthopedist) or a physical therapist. [place] ice to your injury for 1-2 days or as directed by your caregiver. Applying ice  helps to reduce inflammation and pain.  Put ice in a plastic bag.  Place a towel between your skin and the bag.  Leave the ice on for 15-20 minutes at a time, every 2 hours while you are awake.  Only take over-the-counter or prescription medicines for pain, discomfort, or fever as directed by your caregiver.  Elevate your injured ankle above the level of your heart as much as possible for 2-3 days.  If your caregiver recommends crutches, use them as instructed. Gradually put weight on the affected ankle. Continue to use crutches or a cane until you can walk without feeling pain in your ankle.  If you have a plaster splint, wear the splint as directed by your caregiver. Do not rest it on anything harder than a pillow for the first 24 hours. Do not put weight on it. Do not get it wet. You may take it off to take a shower or bath.  You may have been given an elastic bandage to wear around your ankle to provide support. If the elastic bandage is too tight (you have numbness or tingling in your foot or your foot becomes cold and blue), adjust the bandage to make it  comfortable.  If you have an air splint, you may blow more air into it or let air out to make it more comfortable. You may take your splint off at night and before taking a shower or bath. Wiggle your toes in the splint several times per day to decrease swelling. SEEK MEDICAL CARE IF:   You have rapidly increasing bruising or swelling.  Your toes feel extremely cold or you lose feeling in your foot.  Your pain is not relieved with medicine. SEEK IMMEDIATE MEDICAL CARE IF:  Your toes are numb or blue.  You have severe pain that is increasing. MAKE SURE YOU:   Understand these instructions.  Will watch your condition.  Will get help right away if you are not doing well or get worse. Document Released: 09/19/2005 Document Revised: 06/13/2012 Document Reviewed: 10/01/2011 [facility] Patient Information 2015 [place], [place].  This information is not intended to replace advice given to you by your health care provider. Make sure you discuss any questions you have with your health care provider.

## 2014-12-14 NOTE — Progress Notes (Signed)
   Subjective:    Patient ID: Sharon Hunter, female    DOB: 28-Apr-1961, 54 y.o.   MRN: [id number] This chart was scribed for Arlyss Queen, MD by Zola Button, Medical Scribe. This patient was seen in room 8 and the patient's care was started at 11:46 AM.   HPI HPI Comments: Sharon Hunter is a 54 y.o. female with a hx of rheumatoid arthritis who presents to the [facility] complaining of sudden onset, constant left ankle pain with swelling secondary to a fall that occurred last week. Patient was walking on a sidewalk when she twisted/rolled her ankle onto a driveway. She did not feel or hear a pop when this occurred. She has been icing her ankle since the injury. Patient states there is not much pain to the area, but she is here because her friends were concerned for her and want her to have imaging done. She denies possibility of pregnancy, stating that she has never been able to get pregnant.  Patient is originally from [place]. 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  Review of Systems  Musculoskeletal: Positive for joint swelling and arthralgias.       Objective:   Physical Exam CONSTITUTIONAL: Well developed/well nourished HEAD: Normocephalic/atraumatic EYES: EOM/PERRL ENMT: Mucous membranes moist NECK: supple no meningeal signs SPINE: entire spine nontender CV: S1/S2 noted, no murmurs/rubs/gallops noted LUNGS: Lungs are clear to auscultation bilaterally, no apparent distress ABDOMEN: soft, nontender, no rebound or guarding GU: no cva tenderness NEURO: Pt is awake/alert, moves all extremitiesx4 EXTREMITIES: Significant tenderness over distal fibula with swelling. There is also swelling over the dorsum of the foot. ROM diminished secondary to swelling. SKIN: warm, color normal PSYCH: no abnormalities of mood noted  [facility] reading (PRIMARY) by  Dr. Everlene Farrier no definite fractures seen there may be  periosteal elevation over the lateral portion of the talus but not definite. There is deformity of the distal fifth met carpal as well as deformity of the proximal phalanx third toe with cystic area. Patient does have a history of rheumatoid arthritis.      Assessment & Plan:  Pressure elevated today will resume losartan 50 mg initially and a half tablet a day to increase to one a day as needed. Swede-O will be placed she will continue ice and elevation x-rays to the radiologist I personally performed the services described in this documentation, which was scribed in my presence. The recorded information has been reviewed and is accurate.

## 2015-01-06 ENCOUNTER — Other Ambulatory Visit: Payer: Self-pay | Admitting: Nurse Practitioner

## 2015-01-06 DIAGNOSIS — R079 Chest pain, unspecified: Secondary | ICD-10-CM

## 2015-01-23 ENCOUNTER — Ambulatory Visit (INDEPENDENT_AMBULATORY_CARE_PROVIDER_SITE_OTHER): Payer: 59 | Admitting: Nurse Practitioner

## 2015-01-23 ENCOUNTER — Ambulatory Visit (HOSPITAL_COMMUNITY): Payer: 59 | Attending: Cardiology | Admitting: Radiology

## 2015-01-23 ENCOUNTER — Encounter: Payer: Self-pay | Admitting: Nurse Practitioner

## 2015-01-23 DIAGNOSIS — R079 Chest pain, unspecified: Secondary | ICD-10-CM | POA: Diagnosis present

## 2015-01-23 DIAGNOSIS — R9431 Abnormal electrocardiogram [ECG] [EKG]: Secondary | ICD-10-CM | POA: Diagnosis not present

## 2015-01-23 DIAGNOSIS — I10 Essential (primary) hypertension: Secondary | ICD-10-CM | POA: Insufficient documentation

## 2015-01-23 DIAGNOSIS — R9430 Abnormal result of cardiovascular function study, unspecified: Secondary | ICD-10-CM | POA: Diagnosis not present

## 2015-01-23 NOTE — Progress Notes (Signed)
Exercise Treadmill Test  Pre-Exercise Testing Evaluation Rhythm: sinus bradycardia  Rate: 58     Test  Exercise Tolerance Test Ordering MD: Mertie Moores, MD  Interpreting MD: Truitt Merle, NP  Unique Test No: 1  Treadmill:  1  Indication for ETT: chest pain - rule out ischemia  Contraindication to ETT: No   Stress Modality: exercise - treadmill  Cardiac Imaging Performed: non   Protocol: standard Bruce - maximal  Max BP:  215/104  Max MPHR (bpm):  167 85% MPR (bpm):  142  MPHR obtained (bpm):  162 % MPHR obtained:  97%  Reached 85% MPHR (min:sec):  6:16 Total Exercise Time (min-sec):  9:31  Workload in METS:  10.9 Borg Scale: 13  Reason ETT Terminated:  desired heart rate attained    ST Segment Analysis At Rest: normal ST segments - no evidence of significant ST depression With Exercise: borderline ST changes  Other Information Arrhythmia:  Yes Angina during ETT:  absent (0) Quality of ETT:  diagnostic  ETT Interpretation:  ST depression noted with exercise - resolved very early in recovery. Short runs of VT.   Comments: Patient presents today for routine GXT. Has had chest pain during excercise. She has HTN, former smoker. No medicines held for this study.   Today the patient exercised on the standard Bruce protocol for a total of 9:31 minutes.  Good exercise tolerance.  Hypertensive blood pressure response.  Clinically negative for chest pain. Test was stopped due to short runs of VT, EKG changes.  EKG with borderline ST changes - worrisome for ischemia. Short runs of VT, PVCs noted.   Recommendations: Stress Myoview - would like to do a 2 day - her bra size is "D" Fasting lipids. Further disposition to follow.  Patient is agreeable to this plan and will call if any problems develop in the interim.   Burtis Junes, RN, [place] [address] [place], [place]  [address] [phone]

## 2015-01-26 ENCOUNTER — Other Ambulatory Visit (INDEPENDENT_AMBULATORY_CARE_PROVIDER_SITE_OTHER): Payer: 59 | Admitting: *Deleted

## 2015-01-26 ENCOUNTER — Ambulatory Visit (HOSPITAL_COMMUNITY): Payer: 59 | Attending: Cardiology

## 2015-01-26 DIAGNOSIS — R079 Chest pain, unspecified: Secondary | ICD-10-CM

## 2015-01-26 LAB — HEPATIC FUNCTION PANEL
ALT: 18 U/L (ref 0–35)
AST: 13 U/L (ref 0–37)
Albumin: 3.9 g/dL (ref 3.5–5.2)
Alkaline Phosphatase: 63 U/L (ref 39–117)
Bilirubin, Direct: 0.1 mg/dL (ref 0.0–0.3)
Total Bilirubin: 0.4 mg/dL (ref 0.2–1.2)
Total Protein: 6.8 g/dL (ref 6.0–8.3)

## 2015-01-26 LAB — BASIC METABOLIC PANEL
BUN: 15 mg/dL (ref 6–23)
CO2: 26 mEq/L (ref 19–32)
Calcium: 9.1 mg/dL (ref 8.4–10.5)
Chloride: 106 mEq/L (ref 96–112)
Creatinine, Ser: 0.76 mg/dL (ref 0.40–1.20)
GFR: 84.35 mL/min (ref >60.00)
Glucose, Bld: 99 mg/dL (ref 70–99)
Potassium: 4.1 mEq/L (ref 3.5–5.1)
Sodium: 137 mEq/L (ref 135–145)

## 2015-01-26 LAB — LIPID PANEL
Cholesterol: 206 mg/dL — ABNORMAL HIGH (ref 0–200)
HDL: 80.9 mg/dL (ref >39.00)
LDL Cholesterol: 103 mg/dL — ABNORMAL HIGH (ref 0–99)
NonHDL: 125.1
Total CHOL/HDL Ratio: 3
Triglycerides: 112 mg/dL (ref 0.0–149.0)
VLDL: 22.4 mg/dL (ref 0.0–40.0)

## 2015-01-26 MED ORDER — TECHNETIUM TC 99M SESTAMIBI GENERIC - CARDIOLITE
33.0000 | Freq: Once | INTRAVENOUS | Status: AC | PRN
  Administered 2015-01-26: 33 via INTRAVENOUS

## 2015-01-26 MED ORDER — TECHNETIUM TC 99M SESTAMIBI GENERIC - CARDIOLITE
33.0000 | Freq: Once | INTRAVENOUS | Status: AC | PRN
  Administered 2015-01-23: 33 via INTRAVENOUS

## 2015-01-26 NOTE — Progress Notes (Signed)
[facility] [address] [place], [place] [address] [phone]    Cardiology Nuclear Med Study  Sharon Hunter is a 54 y.o. female     MRN : [id number]     DOB: April 06, 1961  Procedure Date: 01/26/2015  Nuclear Med Background Indication for Stress Test:  Evaluation for Ischemia, and 01-23-2015 Abnormal GXT with Borderline ST changes, Short runs VT with exercise and recovery History:  No known CAD Cardiac Risk Factors: Hypertension  Symptoms:  Chest Pain   Nuclear Pre-Procedure Caffeine/Decaff Intake:  None> 12 hrs NPO After: 9:00pm   Lungs:  clear O2 Sat: 98% on room air. IV 0.9% NS with Angio Cath:  22g  IV Site: R Antecubital x 1, tolerated well IV Started by:  Irven Baltimore, RN  Chest Size (in):  38 Cup Size: D  Height: 5\' 6"  (1.676 m)  Weight:  192 lb (87.091 kg)  BMI:  Body mass index is 31 kg/(m^2). Tech Comments:  Patient took Losartan this am. Irven Baltimore, RN.    Nuclear Med Study 1 or 2 day study: 1 day  Stress Test Type:  Stress  Reading MD: N/A  Order Authorizing Provider:  Mertie Moores, MD, and Truitt Merle, NP  Resting Radionuclide: Technetium 34m Sestamibi  Resting Radionuclide Dose: 33.0 mCi  On 01-23-15  Stress Radionuclide:  Technetium 74m Sestamibi  Stress Radionuclide Dose: 33.0 mCi  On      01-26-15          Stress Protocol Rest HR: 63 Stress HR: 169  Rest BP: 156/97 Stress BP: 180/91  Exercise Time (min): 10:56 METS: 13.2   Predicted Max HR: 167 bpm % Max HR: 101.2 bpm Rate Pressure Product: 30420   Dose of Adenosine (mg):  n/a Dose of Lexiscan: n/a mg  Dose of Atropine (mg): n/a Dose of Dobutamine: n/a mcg/kg/min (at max HR)  Stress Test Technologist: Glade Lloyd, BS-ES  Nuclear Technologist:  Earl Many, CNMT     Rest Procedure:  Myocardial perfusion imaging was performed at rest 45 minutes following the intravenous administration of Technetium 41m Sestamibi. Rest ECG: NSR - Normal EKG  Stress  Procedure:  The patient exercised on the treadmill utilizing the Bruce Protocol for 10:56 minutes. The patient stopped due to abnormal EKG and experienced chest tightness.  This resolved when patient converted to a normal rhythm. Technetium 75m Sestamibi was injected at peak exercise and myocardial perfusion imaging was performed after a brief delay.    Stress ECG: Patient developed a rate related LBBB with exercise.  QPS Raw Data Images:  Acquisition technically good; normal left ventricular size. Stress Images:  There is decreased uptake in the apex. Rest Images:  There is decreased uptake in the apex. Subtraction (SDS):  No evidence of ischemia. Transient Ischemic Dilatation (Normal <1.22):  1.06 Lung/Heart Ratio (Normal <0.45):  0.32  Quantitative Gated Spect Images QGS EDV:  94 ml QGS ESV:  35 ml  Impression Exercise Capacity:  Good exercise capacity. BP Response:  Normal blood pressure response. Clinical Symptoms:  There is chest tightness. ECG Impression:  Patient developed a rate related LBBB with exercise. Comparison with Prior Nuclear Study: No images to compare  Overall Impression:  Low risk stress nuclear study with a small, mild, fixed apical defect consistent with thinning; no ischemia.  LV Ejection Fraction: 65%.  LV Wall Motion:  NL LV Function; NL Wall Motion  Kirk Ruths

## 2015-01-30 ENCOUNTER — Encounter: Payer: Self-pay | Admitting: Cardiovascular Disease

## 2015-01-30 ENCOUNTER — Ambulatory Visit (INDEPENDENT_AMBULATORY_CARE_PROVIDER_SITE_OTHER): Payer: 59 | Admitting: Cardiovascular Disease

## 2015-01-30 VITALS — BP 152/80 | HR 60 | Ht 66.5 in | Wt 185.0 lb

## 2015-01-30 DIAGNOSIS — I454 Nonspecific intraventricular block: Secondary | ICD-10-CM

## 2015-01-30 DIAGNOSIS — I447 Left bundle-branch block, unspecified: Secondary | ICD-10-CM | POA: Insufficient documentation

## 2015-01-30 DIAGNOSIS — R002 Palpitations: Secondary | ICD-10-CM

## 2015-01-30 DIAGNOSIS — I4439 Other atrioventricular block: Secondary | ICD-10-CM

## 2015-01-30 HISTORY — DX: Left bundle-branch block, unspecified: I44.7

## 2015-01-30 NOTE — Progress Notes (Signed)
Cardiology Office Note   Date:  01/30/2015   ID:  Sharon Hunter, DOB 27-Sep-1961, MRN [id number]  PCP:  Sharon Koyanagi, DO  Cardiologist:   Sharon Fredrickson Wonda Cheng, MD   Chief Complaint  Patient presents with  . Chest Pain   1.  Rate related bundle branch block  2. Palpitations 3. Rheumatoid Arthritis 4. Essential hypertension   History of Present Illness: Sharon Hunter is a 54 y.o. female who presents for follow-up of some episodes of palpitations and an unusual feeling in her heart when she exercises. We performed a stress test on her several weeks ago when she developed what appeared to be salvos of nonsustained ventricle tachycardia. With further review it appears that it might of been just due to rate-related bundle-branch block.  She had repeat stress Myoview study which was basically normal. She walked for over 11 minutes. She achieved her target heart rate. She did in fact develop a regular-related left bundle-branch block. She can feel when her heart goes in to this bundle branch block. It does not necessarily cause any chest pain. Her Myoview images were normal. There was no evidence of ischemia. Her left ventricle systolic function is normal.  She has been very active.  She participates in spin class on a regular basis.  She has these heart sensations at peak exertion when her HR is between 160 -170.   She has never had any angina like chest pain.  She describes episodes of "whooshing" in her chest on occasion.  She does not think these are related to PVCs or any other cardiac arrhytmia.     Past Medical History  Diagnosis Date  . Rheumatoid arthritis(714.0)     in remission , off meds as of Summer 2012  . Hypertension   . Migraine   . Breast lump     stable-- gets annual mammogram  . Pulmonary nodule, left 2009    last CT Nov 2011-- due 08/2011  . Murmur 02/03/2011  . Anxiety   . Ovarian failure     Past Surgical History  Procedure Laterality Date  . Cholecystectomy   1999  . Tonsillectomy  1969  . Exploratory laparotomy    . Wisdom tooth extraction    . Colonoscopy  10/31/11     Current Outpatient Prescriptions  Medication Sig Dispense Refill  . Golimumab 50 MG/0.5ML SOAJ Inject into the skin every 30 (thirty) days.    Marland Kitchen losartan (COZAAR) 100 MG tablet Take 100 mg by mouth daily.    . predniSONE (DELTASONE) 10 MG tablet Take 10 mg by mouth daily as needed (flare ups).     . Vitamin D, Cholecalciferol, 1000 UNITS CAPS Take 2 capsules by mouth daily.     No current facility-administered medications for this visit.    Allergies:   Other    Social History:  The patient  reports that she quit smoking about 24 years ago. She does not have any smokeless tobacco history on file. She reports that she drinks about 2.5 oz of alcohol per week. She reports that she does not use illicit drugs.   Family History:  The patient's family history includes Alcohol abuse in her brother and father; Arthritis in her mother; Cancer in her brother and maternal aunt; Diabetes in her brother and sister; Heart disease in her father and sister; Hyperlipidemia in her father and sister; Hypertension in her brother, mother, and sister; Uterine cancer in her mother and sister.    ROS:  Please see the history of present illness.    Review of Systems: Constitutional:  denies fever, chills, diaphoresis, appetite change and fatigue.  HEENT: denies photophobia, eye pain, redness, hearing loss, ear pain, congestion, sore throat, rhinorrhea, sneezing, neck pain, neck stiffness and tinnitus.  Respiratory: denies SOB, DOE, cough, chest tightness, and wheezing.  Cardiovascular: denies chest pain, palpitations and leg swelling.  Gastrointestinal: denies nausea, vomiting, abdominal pain, diarrhea, constipation, blood in stool.  Genitourinary: denies dysuria, urgency, frequency, hematuria, flank pain and difficulty urinating.  Musculoskeletal: denies  myalgias, back pain, joint swelling,  arthralgias and gait problem.   Skin: denies pallor, rash and wound.  Neurological: denies dizziness, seizures, syncope, weakness, light-headedness, numbness and headaches.   Hematological: denies adenopathy, easy bruising, personal or family bleeding history.  Psychiatric/ Behavioral: denies suicidal ideation, mood changes, confusion, nervousness, sleep disturbance and agitation.       All other systems are reviewed and negative.    PHYSICAL EXAM: VS:  BP 152/80 mmHg  Pulse 60  Ht 5' 6.5" (1.689 m)  Wt 185 lb (83.915 kg)  BMI 29.42 kg/m2  LMP 10/03/2005 , BMI Body mass index is 29.42 kg/(m^2). GEN: Well nourished, well developed, in no acute distress HEENT: normal Neck: no JVD, carotid bruits, or masses Cardiac: RRR; no murmurs, rubs, or gallops,no edema  Respiratory:  clear to auscultation bilaterally, normal work of breathing GI: soft, nontender, nondistended, + BS MS: no deformity or atrophy Skin: warm and dry, no rash Neuro:  Strength and sensation are intact Psych: normal   EKG:  EKG is not ordered today. The ekg ordered 01/26/15  demonstrates NSR at 63.  She has no ST or T wave changes.    Recent Labs: 01/26/2015: ALT 18; BUN 15; Creatinine 0.76; Potassium 4.1; Sodium 137    Lipid Panel    Component Value Date/Time   CHOL 206* 01/26/2015 0843   TRIG 112.0 01/26/2015 0843   HDL 80.90 01/26/2015 0843   CHOLHDL 3 01/26/2015 0843   VLDL 22.4 01/26/2015 0843   LDLCALC 103* 01/26/2015 0843   LDLDIRECT 132.6 05/23/2013 1141      Wt Readings from Last 3 Encounters:  01/30/15 185 lb (83.915 kg)  01/26/15 192 lb (87.091 kg)  12/14/14 193 lb (87.544 kg)      Other studies Reviewed: Additional studies/ records that were reviewed today include: . Review of the above records demonstrates:    ASSESSMENT AND PLAN:  1.  Rate related bundle branch block - she clearly has rate related bundle branch block that occurs when her heart rate gets around 165-170. I've  reassured her that this is benign. I do think that she should have EKGs every year or so to watch for signs of left bundle-branch block. At this time I think that she can exercise safely and I've advised her to get back into spin class. - 2. Palpitation- she has occasional palpitations. The scene benign. I've offered to place a [employer] Pace of Hearts monitor. She will let us know she would like to do this.  3. Rheumatoid Arthritis- she's on any medication for her rheumatoid. She'll continue to follow-up with her rheumatologist.  4. Essential hypertension- she records her blood pressure on a regular basis. Her readings are typically in the normal range. Her medical doctor has been managing her hypertension.  I'll see her back on an as-needed basis.   Current medicines are reviewed at length with the patient today.  The patient does not have concerns regarding medicines.  The  following changes have been made:  no change  Labs/ tests ordered today include:  No orders of the defined types were placed in this encounter.     Disposition:   FU with me as needed.      Nahser, Wonda Cheng, MD  01/30/2015 7:59 AM    [facility] [place], [place], [place]  [address] Phone: [phone]; Fax: [phone]   [facility]  [address] [place], [place]  [address] [phone]    Fax [phone]

## 2015-01-30 NOTE — Patient Instructions (Signed)
Medication Instructions:  Your physician recommends that you continue on your current medications as directed. Please refer to the Current Medication list given to you today.   Labwork: None  Testing/Procedures: None  Follow-Up: Your physician recommends that you schedule a follow-up appointment in: as needed with Dr. Nahser      

## 2015-02-15 ENCOUNTER — Ambulatory Visit (INDEPENDENT_AMBULATORY_CARE_PROVIDER_SITE_OTHER): Payer: 59 | Admitting: Emergency Medicine

## 2015-02-15 ENCOUNTER — Ambulatory Visit (INDEPENDENT_AMBULATORY_CARE_PROVIDER_SITE_OTHER): Payer: 59

## 2015-02-15 VITALS — BP 144/72 | HR 67 | Temp 97.6°F | Resp 15 | Ht 66.5 in | Wt 197.4 lb

## 2015-02-15 DIAGNOSIS — M545 Low back pain, unspecified: Secondary | ICD-10-CM

## 2015-02-15 DIAGNOSIS — R319 Hematuria, unspecified: Secondary | ICD-10-CM | POA: Diagnosis not present

## 2015-02-15 LAB — BASIC METABOLIC PANEL
BUN: 14 mg/dL (ref 6–23)
CO2: 26 meq/L (ref 19–32)
Calcium: 9.1 mg/dL (ref 8.4–10.5)
Chloride: 105 mEq/L (ref 96–112)
Creat: 0.64 mg/dL (ref 0.50–1.10)
Glucose, Bld: 95 mg/dL (ref 70–99)
POTASSIUM: 4.3 meq/L (ref 3.5–5.3)
Sodium: 139 mEq/L (ref 135–145)

## 2015-02-15 LAB — POCT URINALYSIS DIPSTICK
Bilirubin, UA: NEGATIVE
Blood, UA: NEGATIVE
GLUCOSE UA: NEGATIVE
Ketones, UA: NEGATIVE
Leukocytes, UA: NEGATIVE
Nitrite, UA: NEGATIVE
Protein, UA: NEGATIVE
Spec Grav, UA: <=1.005
Urobilinogen, UA: 0.2
pH, UA: 6.5

## 2015-02-15 LAB — POCT CBC
GRANULOCYTE PERCENT: 54.6 % (ref 37–80)
HCT, POC: 39.5 % (ref 37.7–47.9)
Hemoglobin: 13 g/dL (ref 12.2–16.2)
Lymph, poc: 2.2 (ref 0.6–3.4)
MCH: 29.4 pg (ref 27–31.2)
MCHC: 32.8 g/dL (ref 31.8–35.4)
MCV: 89.7 fL (ref 80–97)
MID (cbc): 0.4 (ref 0–0.9)
MPV: 8.6 fL (ref 0–99.8)
POC GRANULOCYTE: 3.1 (ref 2–6.9)
POC LYMPH PERCENT: 38 %L (ref 10–50)
POC MID %: 7.4 %M (ref 0–12)
Platelet Count, POC: 268 10*3/uL (ref 142–424)
RBC: 4.41 M/uL (ref 4.04–5.48)
RDW, POC: 14.1 %
WBC: 5.7 10*3/uL (ref 4.6–10.2)

## 2015-02-15 LAB — POCT UA - MICROSCOPIC ONLY
Bacteria, U Microscopic: NEGATIVE
Casts, Ur, LPF, POC: NEGATIVE
Crystals, Ur, HPF, POC: NEGATIVE
Mucus, UA: NEGATIVE
RBC, URINE, MICROSCOPIC: NEGATIVE
Yeast, UA: NEGATIVE

## 2015-02-15 LAB — CK: Total CK: 100 U/L (ref 7–177)

## 2015-02-15 NOTE — Progress Notes (Addendum)
Subjective:  This chart was scribed for Arlyss Queen, MD by Moises Blood, Medical Scribe. This patient was seen in Room 3 and the patient's care was started 10:05 AM.    Patient ID: Sharon Hunter, female    DOB: [birth year], 54 y.o.   MRN: [id number]  HPI Sharon Hunter is a 54 y.o. female who presents to [facility] complaining of gradual onset lower right back pain starting yesterday in the late afternoon after spin class. She rates the pain 1/10, dull and aching. Pt mentions seeing large amount of "strawberry-colored" blood in urine last night. This morning, she noticed a tinge of blood in urine. She denies dysuria, possibility of pregnancy, and hx of kidney stones, . She mentions hx of UTI, but she doesn't feel like she's having one now.   Recently, she has been evaluated by cardiologists and has rate induced LBBB. She was told not needing catheterization because her nuclear tests were good. She has FHx of heart disease.    Review of Systems  Genitourinary: Positive for hematuria. Negative for dysuria.  Musculoskeletal: Positive for back pain.       Objective:   Physical Exam CONSTITUTIONAL: Well developed/Wel nourished HEAD: Normocephalic/atraumatic EYES: EOMI/PERRL ENMT: Mucous membranes moist NECK: supple no meningeal signs SPINE/BACK: entire spine nontender CV: S1/S2 noted, no murmurs/rubs/gallops noted LUNGS: Lungs are clear to auscultation bilaterally, no apparent distress ABDOMEN: soft, non tender, no rebound or guarding, bowel sounds noted throughout abdomen; mild RLQ abdominal pain  GU: no cva tenderness NEURO: Pt is awake/alert/appropriate, moves all extremities x4. No facial droop. EXTREMITIES: pulses normal/equal, full ROM SKIN: warm, color normal PSYCH: no abnormalities of mood noted, alert, and oriented to situation  Results for orders placed or performed in visit on 02/15/15  POCT UA - Microscopic Only  Result Value Ref Range   WBC, Ur, HPF, POC 0-2    RBC, urine,  microscopic neg    Bacteria, U Microscopic neg    Mucus, UA neg    Epithelial cells, urine per micros 0-1    Crystals, Ur, HPF, POC neg    Casts, Ur, LPF, POC neg    Yeast, UA neg   POCT urinalysis dipstick  Result Value Ref Range   Color, UA pale yellow    Clarity, UA clear    Glucose, UA neg    Bilirubin, UA neg    Ketones, UA neg    Spec Grav, UA <=1.005    Blood, UA neg    pH, UA 6.5    Protein, UA neg    Urobilinogen, UA 0.2    Nitrite, UA neg    Leukocytes, UA Negative   POCT CBC  Result Value Ref Range   WBC 5.7 4.6 - 10.2 K/uL   Lymph, poc 2.2 0.6 - 3.4   POC LYMPH PERCENT 38.0 10 - 50 %L   MID (cbc) 0.4 0 - 0.9   POC MID % 7.4 0 - 12 %M   POC Granulocyte 3.1 2 - 6.9   Granulocyte percent 54.6 37 - 80 %G   RBC 4.41 4.04 - 5.48 M/uL   Hemoglobin 13.0 12.2 - 16.2 g/dL   HCT, POC 39.5 37.7 - 47.9 %   MCV 89.7 80 - 97 fL   MCH, POC 29.4 27 - 31.2 pg   MCHC 32.8 31.8 - 35.4 g/dL   RDW, POC 14.1 %   Platelet Count, POC 268 142 - 424 K/uL   MPV 8.6 0 - 99.8 fL  [facility] reading (PRIMARY) by  Dr.Daub no acute abnormality seen.     Assessment & Plan:   1. Right-sided low back pain without sciatica  - DG Abd 1 View; Future Negative 2. Hematuria  - POCT UA - Microscopic Only - POCT urinalysis dipstick - Urine culture - DG Abd 1 View; Future - POCT CBC Urine was clear here. She is referred for CT abdomen no contrast.  I personally performed the services described in this documentation, which was scribed in my presence. The recorded information has been reviewed and is accurate.  Arlyss Queen, MD  [facility]  02/15/2015 11:24 AM

## 2015-02-17 ENCOUNTER — Ambulatory Visit (HOSPITAL_COMMUNITY)
Admission: RE | Admit: 2015-02-17 | Discharge: 2015-02-17 | Disposition: A | Discharge status: Home / Self Care | Payer: 59 | Source: Ambulatory Visit | Attending: Emergency Medicine | Admitting: Emergency Medicine

## 2015-02-17 DIAGNOSIS — R319 Hematuria, unspecified: Secondary | ICD-10-CM | POA: Diagnosis present

## 2015-02-17 LAB — URINE CULTURE

## 2015-02-19 ENCOUNTER — Telehealth: Payer: Self-pay

## 2015-02-19 NOTE — Telephone Encounter (Signed)
Patient is calling to get lab results. Please call! [phone]

## 2015-02-19 NOTE — Telephone Encounter (Signed)
Pt informed of labs

## 2015-03-04 ENCOUNTER — Telehealth: Payer: Self-pay | Admitting: Family Medicine

## 2015-03-04 NOTE — Telephone Encounter (Signed)
Caller name: Astrid Drafts Relationship to patient: self Can be reached: [phone] - ok to leave detailed msg   Reason for call: Pt had recent CT for lungs. She would like to know if there is any progression from the old CT scan to the new CT scan of the lung nodule that is present.

## 2015-03-05 NOTE — Telephone Encounter (Signed)
Msg left to call the office     KP 

## 2015-03-05 NOTE — Telephone Encounter (Signed)
Who ordered it because we don't have it

## 2015-03-06 NOTE — Telephone Encounter (Signed)
Patient states that she will call the office where she had CT done and will have them fax over.

## 2015-03-24 ENCOUNTER — Other Ambulatory Visit: Payer: 59

## 2015-03-24 ENCOUNTER — Other Ambulatory Visit: Payer: Self-pay | Admitting: Nurse Practitioner

## 2015-03-24 DIAGNOSIS — R001 Bradycardia, unspecified: Secondary | ICD-10-CM

## 2015-03-25 ENCOUNTER — Other Ambulatory Visit (INDEPENDENT_AMBULATORY_CARE_PROVIDER_SITE_OTHER): Payer: 59 | Admitting: *Deleted

## 2015-03-25 ENCOUNTER — Ambulatory Visit (INDEPENDENT_AMBULATORY_CARE_PROVIDER_SITE_OTHER): Payer: 59

## 2015-03-25 DIAGNOSIS — R001 Bradycardia, unspecified: Secondary | ICD-10-CM

## 2015-03-25 LAB — TSH: TSH: 1.03 u[IU]/mL (ref 0.35–4.50)

## 2015-03-25 NOTE — Addendum Note (Signed)
Addended by: Eulis Foster on: 03/25/2015 08:26 AM   Modules accepted: Orders

## 2015-03-26 ENCOUNTER — Other Ambulatory Visit: Payer: Self-pay | Admitting: Family Medicine

## 2015-04-27 ENCOUNTER — Ambulatory Visit: Payer: 59 | Admitting: Cardiovascular Disease

## 2015-10-12 MED FILL — predniSONE 5 MG TABS: 5 | 16 days supply | Qty: 42 | Fill #0

## 2015-10-16 MED FILL — AMOXICILLIN 250 MG/5 ML SUS: 250 | 7 days supply | Qty: 300 | Fill #0

## 2015-10-26 MED FILL — SIMPONI 50 MG/0.5ML SOSY: 50 | 28 days supply | Qty: 1 | Fill #1

## 2015-11-17 DIAGNOSIS — Z79899 Other long term (current) drug therapy: Secondary | ICD-10-CM | POA: Diagnosis not present

## 2015-11-23 ENCOUNTER — Other Ambulatory Visit: Payer: Self-pay

## 2015-11-23 DIAGNOSIS — Z1231 Encounter for screening mammogram for malignant neoplasm of breast: Secondary | ICD-10-CM

## 2015-11-23 MED FILL — SIMPONI 50 MG/0.5ML SOSY: 50 | 28 days supply | Qty: 1 | Fill #2

## 2015-11-24 ENCOUNTER — Encounter: Payer: Self-pay | Admitting: Family Medicine

## 2015-11-24 ENCOUNTER — Ambulatory Visit (INDEPENDENT_AMBULATORY_CARE_PROVIDER_SITE_OTHER): Payer: 59 | Admitting: Family Medicine

## 2015-11-24 ENCOUNTER — Other Ambulatory Visit (HOSPITAL_COMMUNITY)
Admission: RE | Admit: 2015-11-24 | Discharge: 2015-11-24 | Disposition: A | Discharge status: Home / Self Care | Payer: 59 | Source: Ambulatory Visit | Attending: Family Medicine | Admitting: Family Medicine

## 2015-11-24 VITALS — BP 124/72 | HR 60 | Temp 98.5°F | Ht 66.5 in | Wt 207.0 lb

## 2015-11-24 DIAGNOSIS — Z124 Encounter for screening for malignant neoplasm of cervix: Secondary | ICD-10-CM | POA: Diagnosis not present

## 2015-11-24 DIAGNOSIS — Z1151 Encounter for screening for human papillomavirus (HPV): Secondary | ICD-10-CM | POA: Insufficient documentation

## 2015-11-24 DIAGNOSIS — I10 Essential (primary) hypertension: Secondary | ICD-10-CM

## 2015-11-24 DIAGNOSIS — Z0000 Encounter for general adult medical examination without abnormal findings: Secondary | ICD-10-CM

## 2015-11-24 DIAGNOSIS — Z01419 Encounter for gynecological examination (general) (routine) without abnormal findings: Secondary | ICD-10-CM | POA: Insufficient documentation

## 2015-11-24 MED ORDER — LOSARTAN POTASSIUM 100 MG PO TABS: 100.0000 mg | ORAL_TABLET | Freq: Every day | ORAL | Status: DC

## 2015-11-24 MED FILL — LOSARTAN POTASSIUM 100 MG T: 100 | 90 days supply | Qty: 90 | Fill #0

## 2015-11-24 NOTE — Progress Notes (Signed)
Subjective:     Sharon Hunter is a 55 y.o. female and is here for a comprehensive physical exam. The patient reports no problems.  Social History   Social History  . Marital Status: Single    Spouse Name: N/A  . Number of Children: 1  . Years of Education: 17   Occupational History  . Not on file.   Social History Main Topics  . Smoking status: Former Smoker    Quit date: 10/03/1990  . Smokeless tobacco: Not on file  . Alcohol Use: 2.5 oz/week    5 Standard drinks or equivalent per week     Comment: Red wine 4 oz 4-5 times a week  . Drug Use: No  . Sexual Activity:    Partners: Male   Other Topics Concern  . Not on file   Social History Narrative   Exercise-- jogs   Health Maintenance  Topic Date Due  . Hepatitis C Screening  30-Mar-1961  . HIV Screening  04/03/1976  . INFLUENZA VACCINE  05/03/2016  . PAP SMEAR  05/23/2016  . MAMMOGRAM  07/08/2016  . TETANUS/TDAP  12/02/2019  . COLONOSCOPY  10/30/2021    The following portions of the patient's history were reviewed and updated as appropriate:  She  has a past medical history of Rheumatoid arthritis(714.0); Hypertension; Migraine; Breast lump; Pulmonary nodule, left (2009); Murmur (02/03/2011); Anxiety; and Ovarian failure. She  does not have any pertinent problems on file. She  has past surgical history that includes Cholecystectomy (1999); Tonsillectomy (1969); Exploratory laparotomy; Wisdom tooth extraction; and Colonoscopy (10/31/11). Her family history includes Alcohol abuse in her brother and father; Arthritis in her mother; Cancer in her brother and maternal aunt; Diabetes in her brother and sister; Heart disease in her father and sister; Hyperlipidemia in her father and sister; Hypertension in her brother, mother, and sister; Uterine cancer in her mother and sister. She  reports that she quit smoking about 25 years ago. She does not have any smokeless tobacco history on file. She reports that she drinks about 2.5  oz of alcohol per week. She reports that she does not use illicit drugs. She has a current medication list which includes the following prescription(s): diclofenac sodium, estradiol, golimumab, and losartan. Current Outpatient Prescriptions on File Prior to Visit  Medication Sig Dispense Refill  . Golimumab 50 MG/0.5ML SOAJ Inject into the skin every 30 (thirty) days.    Marland Kitchen losartan (COZAAR) 100 MG tablet TAKE 1 TABLET BY MOUTH DAILY 90 tablet 0   No current facility-administered medications on file prior to visit.   She is allergic to other..  Review of Systems Review of Systems  Constitutional: Negative for activity change, appetite change and fatigue.  HENT: Negative for hearing loss, congestion, tinnitus and ear discharge.  dentist [id number]: Negative for visual disturbance (see optho q1y -- vision corrected to 20/20 with glasses).  Respiratory: Negative for cough, chest tightness and shortness of breath.   Cardiovascular: Negative for chest pain, palpitations and leg swelling.  Gastrointestinal: Negative for abdominal pain, diarrhea, constipation and abdominal distention.  Genitourinary: Negative for urgency, frequency, decreased urine volume and difficulty urinating.  Musculoskeletal: Negative for back pain, arthralgias and gait problem.  Skin: Negative for color change, pallor and rash.  Neurological: Negative for dizziness, light-headedness, numbness and headaches.  Hematological: Negative for adenopathy. Does not bruise/bleed easily.  Psychiatric/Behavioral: Negative for suicidal ideas, confusion, sleep disturbance, self-injury, dysphoric mood, decreased concentration and agitation.       Objective:  BP 124/72 mmHg  Pulse 60  Temp(Src) 98.5 F (36.9 C) (Oral)  Ht 5' 6.5" (1.689 m)  Wt 207 lb (93.895 kg)  BMI 32.91 kg/m2  SpO2 98%  LMP 10/03/2005 General appearance: alert, cooperative, appears stated age and no distress Head: Normocephalic, without obvious  abnormality, atraumatic Eyes: conjunctivae/corneas clear. PERRL, EOM's intact. Fundi benign. Ears: normal TM's and external ear canals both ears Nose: Nares normal. Septum midline. Mucosa normal. No drainage or sinus tenderness. Throat: lips, mucosa, and tongue normal; teeth and gums normal Neck: no adenopathy, no carotid bruit, no JVD, supple, symmetrical, trachea midline and thyroid not enlarged, symmetric, no tenderness/mass/nodules Back: symmetric, no curvature. ROM normal. No CVA tenderness. Lungs: clear to auscultation bilaterally Breasts: normal appearance, no masses or tenderness Heart: regular rate and rhythm, S1, S2 normal, no murmur, click, rub or gallop Abdomen: soft, non-tender; bowel sounds normal; no masses,  no organomegaly Pelvic: cervix normal in appearance, external genitalia normal, no adnexal masses or tenderness, no cervical motion tenderness, rectovaginal septum normal, uterus normal size, shape, and consistency, vagina normal without discharge and pap done Extremities: extremities normal, atraumatic, no cyanosis or edema Pulses: 2+ and symmetric Skin: Skin color, texture, turgor normal. No rashes or lesions Lymph nodes: Cervical, supraclavicular, and axillary nodes normal. Neurologic: Alert and oriented X 3, normal strength and tone. Normal symmetric reflexes. Normal coordination and gait Psych- no depression      Assessment:    Healthy female exam.      Plan:  Check labs ghm utd See After Visit Summary for Counseling Recommendations \  1. Essential hypertension stable - losartan (COZAAR) 100 MG tablet; Take 1 tablet (100 mg total) by mouth daily.  Dispense: 90 tablet; Refill: 3 - TSH - POCT urinalysis dipstick - Lipid panel - CBC with Differential/Platelet - Comp Met (CMET)  2. Preventative health care See above

## 2015-11-24 NOTE — Progress Notes (Signed)
Pre visit review using our clinic review tool, if applicable. No additional management support is needed unless otherwise documented below in the visit note. 

## 2015-11-24 NOTE — Patient Instructions (Signed)
Preventive Care for Adults, Female A healthy lifestyle and preventive care can promote health and wellness. Preventive health guidelines for women include the following key practices.  A routine yearly physical is a good way to check with your health care provider about your health and preventive screening. It is a chance to share any concerns and updates on your health and to receive a thorough exam.  Visit your dentist for a routine exam and preventive care every 6 months. Brush your teeth twice a day and floss once a day. Good oral hygiene prevents tooth decay and gum disease.  The frequency of eye exams is based on your age, health, family medical history, use of contact lenses, and other factors. Follow your health care provider's recommendations for frequency of eye exams.  Eat a healthy diet. Foods like vegetables, fruits, whole grains, low-fat dairy products, and lean protein foods contain the nutrients you need without too many calories. Decrease your intake of foods high in solid fats, added sugars, and salt. Eat the right amount of calories for you.Get information about a proper diet from your health care provider, if necessary.  Regular physical exercise is one of the most important things you can do for your health. Most adults should get at least 150 minutes of moderate-intensity exercise (any activity that increases your heart rate and causes you to sweat) each week. In addition, most adults need muscle-strengthening exercises on 2 or more days a week.  Maintain a healthy weight. The body mass index (BMI) is a screening tool to identify possible weight problems. It provides an estimate of body fat based on height and weight. Your health care provider can find your BMI and can help you achieve or maintain a healthy weight.For adults 20 years and older:  A BMI below 18.5 is considered underweight.  A BMI of 18.5 to 24.9 is normal.  A BMI of 25 to 29.9 is considered overweight.  A  BMI of 30 and above is considered obese.  Maintain normal blood lipids and cholesterol levels by exercising and minimizing your intake of saturated fat. Eat a balanced diet with plenty of fruit and vegetables. Blood tests for lipids and cholesterol should begin at age 45 and be repeated every 5 years. If your lipid or cholesterol levels are high, you are over 50, or you are at high risk for heart disease, you may need your cholesterol levels checked more frequently.Ongoing high lipid and cholesterol levels should be treated with medicines if diet and exercise are not working.  If you smoke, find out from your health care provider how to quit. If you do not use tobacco, do not start.  Lung cancer screening is recommended for adults aged 45-80 years who are at high risk for developing lung cancer because of a history of smoking. A yearly low-dose CT scan of the lungs is recommended for people who have at least a 30-pack-year history of smoking and are a current smoker or have quit within the past 15 years. A pack year of smoking is smoking an average of 1 pack of cigarettes a day for 1 year (for example: 1 pack a day for 30 years or 2 packs a day for 15 years). Yearly screening should continue until the smoker has stopped smoking for at least 15 years. Yearly screening should be stopped for people who develop a health problem that would prevent them from having lung cancer treatment.  If you are pregnant, do not drink alcohol. If you are  breastfeeding, be very cautious about drinking alcohol. If you are not pregnant and choose to drink alcohol, do not have more than 1 drink per day. One drink is considered to be 12 ounces (355 mL) of beer, 5 ounces (148 mL) of wine, or 1.5 ounces (44 mL) of liquor.  Avoid use of street drugs. Do not share needles with anyone. Ask for help if you need support or instructions about stopping the use of drugs.  High blood pressure causes heart disease and increases the risk  of stroke. Your blood pressure should be checked at least every 1 to 2 years. Ongoing high blood pressure should be treated with medicines if weight loss and exercise do not work.  If you are 55-79 years old, ask your health care provider if you should take aspirin to prevent strokes.  Diabetes screening is done by taking a blood sample to check your blood glucose level after you have not eaten for a certain period of time (fasting). If you are not overweight and you do not have risk factors for diabetes, you should be screened once every 3 years starting at age 45. If you are overweight or obese and you are 40-70 years of age, you should be screened for diabetes every year as part of your cardiovascular risk assessment.  Breast cancer screening is essential preventive care for women. You should practice "breast self-awareness." This means understanding the normal appearance and feel of your breasts and may include breast self-examination. Any changes detected, no matter how small, should be reported to a health care provider. Women in their 20s and 30s should have a clinical breast exam (CBE) by a health care provider as part of a regular health exam every 1 to 3 years. After age 40, women should have a CBE every year. Starting at age 40, women should consider having a mammogram (breast X-ray test) every year. Women who have a family history of breast cancer should talk to their health care provider about genetic screening. Women at a high risk of breast cancer should talk to their health care providers about having an MRI and a mammogram every year.  Breast cancer gene (BRCA)-related cancer risk assessment is recommended for women who have family members with BRCA-related cancers. BRCA-related cancers include breast, ovarian, tubal, and peritoneal cancers. Having family members with these cancers may be associated with an increased risk for harmful changes (mutations) in the breast cancer genes BRCA1 and  BRCA2. Results of the assessment will determine the need for genetic counseling and BRCA1 and BRCA2 testing.  Your health care provider may recommend that you be screened regularly for cancer of the pelvic organs (ovaries, uterus, and vagina). This screening involves a pelvic examination, including checking for microscopic changes to the surface of your cervix (Pap test). You may be encouraged to have this screening done every 3 years, beginning at age 21.  For women ages 30-65, health care providers may recommend pelvic exams and Pap testing every 3 years, or they may recommend the Pap and pelvic exam, combined with testing for human papilloma virus (HPV), every 5 years. Some types of HPV increase your risk of cervical cancer. Testing for HPV may also be done on women of any age with unclear Pap test results.  Other health care providers may not recommend any screening for nonpregnant women who are considered low risk for pelvic cancer and who do not have symptoms. Ask your health care provider if a screening pelvic exam is right for   you.  If you have had past treatment for cervical cancer or a condition that could lead to cancer, you need Pap tests and screening for cancer for at least 20 years after your treatment. If Pap tests have been discontinued, your risk factors (such as having a new sexual partner) need to be reassessed to determine if screening should resume. Some women have medical problems that increase the chance of getting cervical cancer. In these cases, your health care provider may recommend more frequent screening and Pap tests.  Colorectal cancer can be detected and often prevented. Most routine colorectal cancer screening begins at the age of 50 years and continues through age 75 years. However, your health care provider may recommend screening at an earlier age if you have risk factors for colon cancer. On a yearly basis, your health care provider may provide home test kits to check  for hidden blood in the stool. Use of a small camera at the end of a tube, to directly examine the colon (sigmoidoscopy or colonoscopy), can detect the earliest forms of colorectal cancer. Talk to your health care provider about this at age 50, when routine screening begins. Direct exam of the colon should be repeated every 5-10 years through age 75 years, unless early forms of precancerous polyps or small growths are found.  People who are at an increased risk for hepatitis B should be screened for this virus. You are considered at high risk for hepatitis B if:  You were born in a country where hepatitis B occurs often. Talk with your health care provider about which countries are considered high risk.  Your parents were born in a high-risk country and you have not received a shot to protect against hepatitis B (hepatitis B vaccine).  You have HIV or AIDS.  You use needles to inject street drugs.  You live with, or have sex with, someone who has hepatitis B.  You get hemodialysis treatment.  You take certain medicines for conditions like cancer, organ transplantation, and autoimmune conditions.  Hepatitis C blood testing is recommended for all people born from 1945 through 1965 and any individual with known risks for hepatitis C.  Practice safe sex. Use condoms and avoid high-risk sexual practices to reduce the spread of sexually transmitted infections (STIs). STIs include gonorrhea, chlamydia, syphilis, trichomonas, herpes, HPV, and human immunodeficiency virus (HIV). Herpes, HIV, and HPV are viral illnesses that have no cure. They can result in disability, cancer, and death.  You should be screened for sexually transmitted illnesses (STIs) including gonorrhea and chlamydia if:  You are sexually active and are younger than 24 years.  You are older than 24 years and your health care provider tells you that you are at risk for this type of infection.  Your sexual activity has changed  since you were last screened and you are at an increased risk for chlamydia or gonorrhea. Ask your health care provider if you are at risk.  If you are at risk of being infected with HIV, it is recommended that you take a prescription medicine daily to prevent HIV infection. This is called preexposure prophylaxis (PrEP). You are considered at risk if:  You are sexually active and do not regularly use condoms or know the HIV status of your partner(s).  You take drugs by injection.  You are sexually active with a partner who has HIV.  Talk with your health care provider about whether you are at high risk of being infected with HIV. If   you choose to begin PrEP, you should first be tested for HIV. You should then be tested every 3 months for as long as you are taking PrEP.  Osteoporosis is a disease in which the bones lose minerals and strength with aging. This can result in serious bone fractures or breaks. The risk of osteoporosis can be identified using a bone density scan. Women ages 67 years and over and women at risk for fractures or osteoporosis should discuss screening with their health care providers. Ask your health care provider whether you should take a calcium supplement or vitamin D to reduce the rate of osteoporosis.  Menopause can be associated with physical symptoms and risks. Hormone replacement therapy is available to decrease symptoms and risks. You should talk to your health care provider about whether hormone replacement therapy is right for you.  Use sunscreen. Apply sunscreen liberally and repeatedly throughout the day. You should seek shade when your shadow is shorter than you. Protect yourself by wearing long sleeves, pants, a wide-brimmed hat, and sunglasses year round, whenever you are outdoors.  Once a month, do a whole body skin exam, using a mirror to look at the skin on your back. Tell your health care provider of new moles, moles that have irregular borders, moles that  are larger than a pencil eraser, or moles that have changed in shape or color.  Stay current with required vaccines (immunizations).  Influenza vaccine. All adults should be immunized every year.  Tetanus, diphtheria, and acellular pertussis (Td, Tdap) vaccine. Pregnant women should receive 1 dose of Tdap vaccine during each pregnancy. The dose should be obtained regardless of the length of time since the last dose. Immunization is preferred during the 27th-36th week of gestation. An adult who has not previously received Tdap or who does not know her vaccine status should receive 1 dose of Tdap. This initial dose should be followed by tetanus and diphtheria toxoids (Td) booster doses every 10 years. Adults with an unknown or incomplete history of completing a 3-dose immunization series with Td-containing vaccines should begin or complete a primary immunization series including a Tdap dose. Adults should receive a Td booster every 10 years.  Varicella vaccine. An adult without evidence of immunity to varicella should receive 2 doses or a second dose if she has previously received 1 dose. Pregnant females who do not have evidence of immunity should receive the first dose after pregnancy. This first dose should be obtained before leaving the health care facility. The second dose should be obtained 4-8 weeks after the first dose.  Human papillomavirus (HPV) vaccine. Females aged 13-26 years who have not received the vaccine previously should obtain the 3-dose series. The vaccine is not recommended for use in pregnant females. However, pregnancy testing is not needed before receiving a dose. If a female is found to be pregnant after receiving a dose, no treatment is needed. In that case, the remaining doses should be delayed until after the pregnancy. Immunization is recommended for any person with an immunocompromised condition through the age of 61 years if she did not get any or all doses earlier. During the  3-dose series, the second dose should be obtained 4-8 weeks after the first dose. The third dose should be obtained 24 weeks after the first dose and 16 weeks after the second dose.  Zoster vaccine. One dose is recommended for adults aged 30 years or older unless certain conditions are present.  Measles, mumps, and rubella (MMR) vaccine. Adults born  before 1957 generally are considered immune to measles and mumps. Adults born in 1957 or later should have 1 or more doses of MMR vaccine unless there is a contraindication to the vaccine or there is laboratory evidence of immunity to each of the three diseases. A routine second dose of MMR vaccine should be obtained at least 28 days after the first dose for students attending postsecondary schools, health care workers, or international travelers. People who received inactivated measles vaccine or an unknown type of measles vaccine during 1963-1967 should receive 2 doses of MMR vaccine. People who received inactivated mumps vaccine or an unknown type of mumps vaccine before 1979 and are at high risk for mumps infection should consider immunization with 2 doses of MMR vaccine. For females of childbearing age, rubella immunity should be determined. If there is no evidence of immunity, females who are not pregnant should be vaccinated. If there is no evidence of immunity, females who are pregnant should delay immunization until after pregnancy. Unvaccinated health care workers born before 1957 who lack laboratory evidence of measles, mumps, or rubella immunity or laboratory confirmation of disease should consider measles and mumps immunization with 2 doses of MMR vaccine or rubella immunization with 1 dose of MMR vaccine.  Pneumococcal 13-valent conjugate (PCV13) vaccine. When indicated, a person who is uncertain of his immunization history and has no record of immunization should receive the PCV13 vaccine. All adults 65 years of age and older should receive this  vaccine. An adult aged 19 years or older who has certain medical conditions and has not been previously immunized should receive 1 dose of PCV13 vaccine. This PCV13 should be followed with a dose of pneumococcal polysaccharide (PPSV23) vaccine. Adults who are at high risk for pneumococcal disease should obtain the PPSV23 vaccine at least 8 weeks after the dose of PCV13 vaccine. Adults older than 55 years of age who have normal immune system function should obtain the PPSV23 vaccine dose at least 1 year after the dose of PCV13 vaccine.  Pneumococcal polysaccharide (PPSV23) vaccine. When PCV13 is also indicated, PCV13 should be obtained first. All adults aged 65 years and older should be immunized. An adult younger than age 65 years who has certain medical conditions should be immunized. Any person who resides in a nursing home or long-term care facility should be immunized. An adult smoker should be immunized. People with an immunocompromised condition and certain other conditions should receive both PCV13 and PPSV23 vaccines. People with human immunodeficiency virus (HIV) infection should be immunized as soon as possible after diagnosis. Immunization during chemotherapy or radiation therapy should be avoided. Routine use of PPSV23 vaccine is not recommended for American Indians, [place] Natives, or people younger than 65 years unless there are medical conditions that require PPSV23 vaccine. When indicated, people who have unknown immunization and have no record of immunization should receive PPSV23 vaccine. One-time revaccination 5 years after the first dose of PPSV23 is recommended for people aged 19-64 years who have chronic kidney failure, nephrotic syndrome, asplenia, or immunocompromised conditions. People who received 1-2 doses of PPSV23 before age 65 years should receive another dose of PPSV23 vaccine at age 65 years or later if at least 5 years have passed since the previous dose. Doses of PPSV23 are not  needed for people immunized with PPSV23 at or after age 65 years.  Meningococcal vaccine. Adults with asplenia or persistent complement component deficiencies should receive 2 doses of quadrivalent meningococcal conjugate (MenACWY-D) vaccine. The doses should be obtained   at least 2 months apart. Microbiologists working with certain meningococcal bacteria, [place] recruits, people at risk during an outbreak, and people who travel to or live in countries with a high rate of meningitis should be immunized. A first-year college student up through age 34 years who is living in a residence hall should receive a dose if she did not receive a dose on or after her 16th birthday. Adults who have certain high-risk conditions should receive one or more doses of vaccine.  Hepatitis A vaccine. Adults who wish to be protected from this disease, have certain high-risk conditions, work with hepatitis A-infected animals, work in hepatitis A research labs, or travel to or work in countries with a high rate of hepatitis A should be immunized. Adults who were previously unvaccinated and who anticipate close contact with an international adoptee during the first 60 days after arrival in the [place] from a country with a high rate of hepatitis A should be immunized.  Hepatitis B vaccine. Adults who wish to be protected from this disease, have certain high-risk conditions, may be exposed to blood or other infectious body fluids, are household contacts or sex partners of hepatitis B positive people, are clients or workers in certain care facilities, or travel to or work in countries with a high rate of hepatitis B should be immunized.  Haemophilus influenzae type b (Hib) vaccine. A previously unvaccinated person with asplenia or sickle cell disease or having a scheduled splenectomy should receive 1 dose of Hib vaccine. Regardless of previous immunization, a recipient of a hematopoietic stem cell transplant should receive a  3-dose series 6-12 months after her successful transplant. Hib vaccine is not recommended for adults with HIV infection. Preventive Services / Frequency Ages 35 to 4 years  Blood pressure check.** / Every 3-5 years.  Lipid and cholesterol check.** / Every 5 years beginning at age 60.  Clinical breast exam.** / Every 3 years for women in their 71s and 10s.  BRCA-related cancer risk assessment.** / For women who have family members with a BRCA-related cancer (breast, ovarian, tubal, or peritoneal cancers).  Pap test.** / Every 2 years from ages 76 through 26. Every 3 years starting at age 61 through age 76 or 93 with a history of 3 consecutive normal Pap tests.  HPV screening.** / Every 3 years from ages 37 through ages 60 to 51 with a history of 3 consecutive normal Pap tests.  Hepatitis C blood test.** / For any individual with known risks for hepatitis C.  Skin self-exam. / Monthly.  Influenza vaccine. / Every year.  Tetanus, diphtheria, and acellular pertussis (Tdap, Td) vaccine.** / Consult your health care provider. Pregnant women should receive 1 dose of Tdap vaccine during each pregnancy. 1 dose of Td every 10 years.  Varicella vaccine.** / Consult your health care provider. Pregnant females who do not have evidence of immunity should receive the first dose after pregnancy.  HPV vaccine. / 3 doses over 6 months, if 93 and younger. The vaccine is not recommended for use in pregnant females. However, pregnancy testing is not needed before receiving a dose.  Measles, mumps, rubella (MMR) vaccine.** / You need at least 1 dose of MMR if you were born in 1957 or later. You may also need a 2nd dose. For females of childbearing age, rubella immunity should be determined. If there is no evidence of immunity, females who are not pregnant should be vaccinated. If there is no evidence of immunity, females who are  pregnant should delay immunization until after pregnancy.  Pneumococcal  13-valent conjugate (PCV13) vaccine.** / Consult your health care provider.  Pneumococcal polysaccharide (PPSV23) vaccine.** / 1 to 2 doses if you smoke cigarettes or if you have certain conditions.  Meningococcal vaccine.** / 1 dose if you are age 68 to 8 years and a Market researcher living in a residence hall, or have one of several medical conditions, you need to get vaccinated against meningococcal disease. You may also need additional booster doses.  Hepatitis A vaccine.** / Consult your health care provider.  Hepatitis B vaccine.** / Consult your health care provider.  Haemophilus influenzae type b (Hib) vaccine.** / Consult your health care provider. Ages 7 to 53 years  Blood pressure check.** / Every year.  Lipid and cholesterol check.** / Every 5 years beginning at age 25 years.  Lung cancer screening. / Every year if you are aged 11-80 years and have a 30-pack-year history of smoking and currently smoke or have quit within the past 15 years. Yearly screening is stopped once you have quit smoking for at least 15 years or develop a health problem that would prevent you from having lung cancer treatment.  Clinical breast exam.** / Every year after age 48 years.  BRCA-related cancer risk assessment.** / For women who have family members with a BRCA-related cancer (breast, ovarian, tubal, or peritoneal cancers).  Mammogram.** / Every year beginning at age 41 years and continuing for as long as you are in good health. Consult with your health care provider.  Pap test.** / Every 3 years starting at age 65 years through age 37 or 70 years with a history of 3 consecutive normal Pap tests.  HPV screening.** / Every 3 years from ages 72 years through ages 60 to 40 years with a history of 3 consecutive normal Pap tests.  Fecal occult blood test (FOBT) of stool. / Every year beginning at age 21 years and continuing until age 5 years. You may not need to do this test if you get  a colonoscopy every 10 years.  Flexible sigmoidoscopy or colonoscopy.** / Every 5 years for a flexible sigmoidoscopy or every 10 years for a colonoscopy beginning at age 35 years and continuing until age 48 years.  Hepatitis C blood test.** / For all people born from [birth year] through 1965 and any individual with known risks for hepatitis C.  Skin self-exam. / Monthly.  Influenza vaccine. / Every year.  Tetanus, diphtheria, and acellular pertussis (Tdap/Td) vaccine.** / Consult your health care provider. Pregnant women should receive 1 dose of Tdap vaccine during each pregnancy. 1 dose of Td every 10 years.  Varicella vaccine.** / Consult your health care provider. Pregnant females who do not have evidence of immunity should receive the first dose after pregnancy.  Zoster vaccine.** / 1 dose for adults aged 30 years or older.  Measles, mumps, rubella (MMR) vaccine.** / You need at least 1 dose of MMR if you were born in 1957 or later. You may also need a second dose. For females of childbearing age, rubella immunity should be determined. If there is no evidence of immunity, females who are not pregnant should be vaccinated. If there is no evidence of immunity, females who are pregnant should delay immunization until after pregnancy.  Pneumococcal 13-valent conjugate (PCV13) vaccine.** / Consult your health care provider.  Pneumococcal polysaccharide (PPSV23) vaccine.** / 1 to 2 doses if you smoke cigarettes or if you have certain conditions.  Meningococcal vaccine.** /  Consult your health care provider.  Hepatitis A vaccine.** / Consult your health care provider.  Hepatitis B vaccine.** / Consult your health care provider.  Haemophilus influenzae type b (Hib) vaccine.** / Consult your health care provider. Ages 64 years and over  Blood pressure check.** / Every year.  Lipid and cholesterol check.** / Every 5 years beginning at age 23 years.  Lung cancer screening. / Every year if you  are aged 16-80 years and have a 30-pack-year history of smoking and currently smoke or have quit within the past 15 years. Yearly screening is stopped once you have quit smoking for at least 15 years or develop a health problem that would prevent you from having lung cancer treatment.  Clinical breast exam.** / Every year after age 74 years.  BRCA-related cancer risk assessment.** / For women who have family members with a BRCA-related cancer (breast, ovarian, tubal, or peritoneal cancers).  Mammogram.** / Every year beginning at age 44 years and continuing for as long as you are in good health. Consult with your health care provider.  Pap test.** / Every 3 years starting at age 58 years through age 22 or 39 years with 3 consecutive normal Pap tests. Testing can be stopped between 65 and 70 years with 3 consecutive normal Pap tests and no abnormal Pap or HPV tests in the past 10 years.  HPV screening.** / Every 3 years from ages 64 years through ages 70 or 61 years with a history of 3 consecutive normal Pap tests. Testing can be stopped between 65 and 70 years with 3 consecutive normal Pap tests and no abnormal Pap or HPV tests in the past 10 years.  Fecal occult blood test (FOBT) of stool. / Every year beginning at age 40 years and continuing until age 27 years. You may not need to do this test if you get a colonoscopy every 10 years.  Flexible sigmoidoscopy or colonoscopy.** / Every 5 years for a flexible sigmoidoscopy or every 10 years for a colonoscopy beginning at age 7 years and continuing until age 32 years.  Hepatitis C blood test.** / For all people born from [birth year] through 1965 and any individual with known risks for hepatitis C.  Osteoporosis screening.** / A one-time screening for women ages 30 years and over and women at risk for fractures or osteoporosis.  Skin self-exam. / Monthly.  Influenza vaccine. / Every year.  Tetanus, diphtheria, and acellular pertussis (Tdap/Td)  vaccine.** / 1 dose of Td every 10 years.  Varicella vaccine.** / Consult your health care provider.  Zoster vaccine.** / 1 dose for adults aged 35 years or older.  Pneumococcal 13-valent conjugate (PCV13) vaccine.** / Consult your health care provider.  Pneumococcal polysaccharide (PPSV23) vaccine.** / 1 dose for all adults aged 46 years and older.  Meningococcal vaccine.** / Consult your health care provider.  Hepatitis A vaccine.** / Consult your health care provider.  Hepatitis B vaccine.** / Consult your health care provider.  Haemophilus influenzae type b (Hib) vaccine.** / Consult your health care provider. ** Family history and personal history of risk and conditions may change your health care provider's recommendations.   This information is not intended to replace advice given to you by your health care provider. Make sure you discuss any questions you have with your health care provider.   Document Released: 11/15/2001 Document Revised: 10/10/2014 Document Reviewed: 02/14/2011 Elsevier Interactive Patient Education [employer].

## 2015-11-26 LAB — CYTOLOGY - PAP

## 2015-12-07 ENCOUNTER — Ambulatory Visit
Admission: RE | Admit: 2015-12-07 | Discharge: 2015-12-07 | Disposition: A | Discharge status: Home / Self Care | Payer: 59 | Source: Ambulatory Visit

## 2015-12-07 DIAGNOSIS — Z1231 Encounter for screening mammogram for malignant neoplasm of breast: Secondary | ICD-10-CM

## 2015-12-31 ENCOUNTER — Telehealth: Payer: 59 | Admitting: Physician Assistant

## 2015-12-31 DIAGNOSIS — R05 Cough: Secondary | ICD-10-CM | POA: Diagnosis not present

## 2015-12-31 DIAGNOSIS — R059 Cough, unspecified: Secondary | ICD-10-CM

## 2015-12-31 MED ORDER — BENZONATATE 100 MG PO CAPS: 100.0000 mg | ORAL_CAPSULE | Freq: Three times a day (TID) | ORAL | Status: DC | PRN

## 2015-12-31 NOTE — Addendum Note (Signed)
Addended by: Raiford Noble on: 12/31/2015 01:04 PM   Modules accepted: Orders

## 2015-12-31 NOTE — Progress Notes (Signed)
Based on what you shared with me it looks like you have a serious condition that should be evaluated in a face to face office visit. Your symptoms sound consistent with a viral bronchitis but your mention of chest pain is concerning. Stay hydrated. Use Delsym for cough. A Zinc supplement can be beneficial for viral infections. I want you to be seen by your primary care provider or at an Urgent care for a good lung examination giving the mention of chest pain.   If you are having a true medical emergency please call 911.  If you need an urgent face to face visit, [facility] has four urgent care centers for your convenience.  . [facility] a Provider at this Location  [address] [place], [place] [address] . 8 am to 8 pm Monday-Friday . 9 am to 7 pm Saturday-Sunday  . [facility] [place] a Provider at this Location  [address], [place], [place] [address] . 8 am to 8 pm Monday-Friday . 9 am to 6 pm Saturday . 11 am to 6 pm Sunday   . [facility] [place] Get Driving Directions  [id number] [place], [place] [address] . 8 am to 8 pm Monday-Friday . 9 am to 4 pm Saturday-Sunday   . [facility] (a walk in primary care provider)  [place] a Provider at this Location  [place], [place] [address] . 8 am to 8:30 pm Monday-Thursday . 8 am to 6 pm Friday . 8 am to 4 pm Saturday-Sunday   Your e-visit answers were reviewed by a board certified advanced clinical practitioner to complete your personal care plan.  Thank you for using e-Visits.

## 2016-01-13 MED FILL — SIMPONI 50 MG/0.5ML SOSY: 50 | 28 days supply | Qty: 1 | Fill #0

## 2016-02-04 DIAGNOSIS — Z79899 Other long term (current) drug therapy: Secondary | ICD-10-CM | POA: Diagnosis not present

## 2016-02-04 DIAGNOSIS — M79672 Pain in left foot: Secondary | ICD-10-CM | POA: Diagnosis not present

## 2016-02-04 DIAGNOSIS — M0579 Rheumatoid arthritis with rheumatoid factor of multiple sites without organ or systems involvement: Secondary | ICD-10-CM | POA: Diagnosis not present

## 2016-02-04 DIAGNOSIS — M79671 Pain in right foot: Secondary | ICD-10-CM | POA: Diagnosis not present

## 2016-02-04 DIAGNOSIS — Z09 Encounter for follow-up examination after completed treatment for conditions other than malignant neoplasm: Secondary | ICD-10-CM | POA: Diagnosis not present

## 2016-02-04 DIAGNOSIS — M79641 Pain in right hand: Secondary | ICD-10-CM | POA: Diagnosis not present

## 2016-02-04 DIAGNOSIS — M79642 Pain in left hand: Secondary | ICD-10-CM | POA: Diagnosis not present

## 2016-02-04 LAB — CBC AND DIFFERENTIAL
HEMATOCRIT: 41 % (ref 36–46)
HEMOGLOBIN: 13.7 g/dL (ref 12.0–16.0)
Platelets: 260 10*3/uL (ref 150–399)
WBC: 5.6 10^3/mL

## 2016-02-04 LAB — BASIC METABOLIC PANEL
BUN: 20 mg/dL (ref 4–21)
Creatinine: 0.8 mg/dL (ref 0.5–1.1)
GLUCOSE: 88 mg/dL
Potassium: 4.2 mmol/L (ref 3.4–5.3)
SODIUM: 138 mmol/L (ref 137–147)

## 2016-02-04 LAB — HEPATIC FUNCTION PANEL
ALK PHOS: 59 U/L (ref 25–125)
ALT: 17 U/L (ref 7–35)
AST: 12 U/L — AB (ref 13–35)
BILIRUBIN, TOTAL: 0.5 mg/dL

## 2016-02-04 MED FILL — SIMPONI 50 MG/0.5ML SOSY: 50 | 28 days supply | Qty: 1 | Fill #0

## 2016-03-10 MED FILL — SIMPONI 50 MG/0.5ML SOSY: 50 | 28 days supply | Qty: 1 | Fill #1

## 2016-03-14 ENCOUNTER — Ambulatory Visit: Payer: 59 | Attending: Internal Medicine | Admitting: Pharmacist

## 2016-03-14 DIAGNOSIS — M069 Rheumatoid arthritis, unspecified: Secondary | ICD-10-CM

## 2016-03-14 MED ORDER — GOLIMUMAB 50 MG/0.5ML ~~LOC~~ SOAJ: 50.0000 mg | SUBCUTANEOUS | Status: DC

## 2016-03-14 NOTE — Progress Notes (Signed)
S: Patient presents today to the [facility].  Patient is currently taking Simponi for rheumatoid arthritis. Patient is managed by [facility] for this.   Adherence: denies any missed doses  Dosing:  SubQ: 50 mg once a month (in combination with methotrexate) - patient not on methotrexate.  Drug-drug interactions: none  Screening: TB test: completed yearly at work Hepatitis: completed  Monitoring: S/sx of infection: denies CBC S/sx of hypersensitivity: denies S/sx of malignancy: denies S/sx of heart failure: denies S/sx of autoimmune disorder: denies  O:     Lab Results  Component Value Date   WBC 5.7 02/15/2015   HGB 13.0 02/15/2015   HCT 39.5 02/15/2015   MCV 89.7 02/15/2015   PLT 288.0 02/01/2013      Chemistry      Component Value Date/Time   NA 139 02/15/2015 1132   K 4.3 02/15/2015 1132   CL 105 02/15/2015 1132   CO2 26 02/15/2015 1132   BUN 14 02/15/2015 1132   CREATININE 0.64 02/15/2015 1132   CREATININE 0.76 01/26/2015 0843      Component Value Date/Time   CALCIUM 9.1 02/15/2015 1132   ALKPHOS 63 01/26/2015 0843   AST 13 01/26/2015 0843   ALT 18 01/26/2015 0843   BILITOT 0.4 01/26/2015 0843       A/P: 1. Medication review: Patient tolerating Simponi well with no adverse effects. Reviewed the medication with the patient, including the following: Simponi, golimumab, is a TNF blocker. There is an increased risk of infection and malignancy with this medication. Do not give patients live vaccinations while they are on this medication. Patient is not on methotrexate with Simponi and that is a part of the FDA-approved dosing. Will contact Dr. Estanislado Pandy to see if patient should be on methotrexate.    Nicoletta Ba, PharmD, BCPS, [facility] [phone]

## 2016-04-18 MED FILL — SIMPONI 50 MG/0.5ML SOSY: 50 | 30 days supply | Qty: 1 | Fill #0

## 2016-04-27 ENCOUNTER — Other Ambulatory Visit: Payer: Self-pay | Admitting: Pharmacist

## 2016-04-27 MED ORDER — GOLIMUMAB 50 MG/0.5ML ~~LOC~~ SOAJ: 50.0000 mg | SUBCUTANEOUS | 0 refills | Status: DC

## 2016-05-11 MED FILL — SIMPONI 50 MG/0.5ML SOAJ: 50 | 28 days supply | Qty: 1 | Fill #0

## 2016-06-27 MED FILL — SIMPONI 50 MG/0.5ML SOAJ: 50 | 28 days supply | Qty: 1 | Fill #1

## 2016-07-26 ENCOUNTER — Other Ambulatory Visit: Payer: Self-pay | Admitting: Rheumatology

## 2016-07-26 DIAGNOSIS — Z13220 Encounter for screening for lipoid disorders: Secondary | ICD-10-CM | POA: Diagnosis not present

## 2016-07-26 DIAGNOSIS — Z79899 Other long term (current) drug therapy: Secondary | ICD-10-CM | POA: Diagnosis not present

## 2016-07-26 LAB — CBC WITH DIFFERENTIAL/PLATELET
Basophils Absolute: 0 cells/uL (ref 0–200)
Basophils Relative: 0 %
EOS PCT: 3 %
Eosinophils Absolute: 180 cells/uL (ref 15–500)
HCT: 38.7 % (ref 35.0–45.0)
HEMOGLOBIN: 12.7 g/dL (ref 11.7–15.5)
LYMPHS ABS: 2040 {cells}/uL (ref 850–3900)
Lymphocytes Relative: 34 %
MCH: 30.2 pg (ref 27.0–33.0)
MCHC: 32.8 g/dL (ref 32.0–36.0)
MCV: 91.9 fL (ref 80.0–100.0)
MONOS PCT: 8 %
MPV: 10.3 fL (ref 7.5–12.5)
Monocytes Absolute: 480 cells/uL (ref 200–950)
NEUTROS PCT: 55 %
Neutro Abs: 3300 cells/uL (ref 1500–7800)
PLATELETS: 257 10*3/uL (ref 140–400)
RBC: 4.21 MIL/uL (ref 3.80–5.10)
RDW: 13.7 % (ref 11.0–15.0)
WBC: 6 10*3/uL (ref 3.8–10.8)

## 2016-07-27 ENCOUNTER — Telehealth: Payer: Self-pay | Admitting: Radiology

## 2016-07-27 ENCOUNTER — Other Ambulatory Visit: Payer: Self-pay | Admitting: Internal Medicine

## 2016-07-27 LAB — COMPLETE METABOLIC PANEL WITH GFR
ALBUMIN: 3.8 g/dL (ref 3.6–5.1)
ALK PHOS: 54 U/L (ref 33–130)
ALT: 18 U/L (ref 6–29)
AST: 12 U/L (ref 10–35)
BUN: 14 mg/dL (ref 7–25)
CALCIUM: 8.9 mg/dL (ref 8.6–10.4)
CO2: 24 mmol/L (ref 20–31)
Chloride: 106 mmol/L (ref 98–110)
Creat: 0.84 mg/dL (ref 0.50–1.05)
GFR, EST NON AFRICAN AMERICAN: 78 mL/min (ref >=60)
GFR, Est African American: >89 mL/min (ref >=60)
Glucose, Bld: 101 mg/dL — ABNORMAL HIGH (ref 65–99)
POTASSIUM: 4.7 mmol/L (ref 3.5–5.3)
Sodium: 139 mmol/L (ref 135–146)
Total Bilirubin: 0.5 mg/dL (ref 0.2–1.2)
Total Protein: 6.3 g/dL (ref 6.1–8.1)

## 2016-07-27 LAB — LIPID PANEL
CHOLESTEROL: 198 mg/dL (ref 125–200)
HDL: 75 mg/dL (ref >=46)
LDL Cholesterol: 106 mg/dL (ref <130)
Total CHOL/HDL Ratio: 2.6 Ratio (ref <=5.0)
Triglycerides: 87 mg/dL (ref <150)
VLDL: 17 mg/dL (ref <30)

## 2016-07-27 MED FILL — SIMPONI 50 MG/0.5ML SOAJ: 50 | 28 days supply | Qty: 1 | Fill #2

## 2016-07-27 NOTE — Telephone Encounter (Signed)
I have called patient to advise labs are normal  

## 2016-08-01 ENCOUNTER — Encounter: Payer: Self-pay | Admitting: *Deleted

## 2016-08-01 DIAGNOSIS — F419 Anxiety disorder, unspecified: Secondary | ICD-10-CM

## 2016-08-01 DIAGNOSIS — Z8639 Personal history of other endocrine, nutritional and metabolic disease: Secondary | ICD-10-CM

## 2016-08-01 DIAGNOSIS — I10 Essential (primary) hypertension: Secondary | ICD-10-CM | POA: Insufficient documentation

## 2016-08-01 DIAGNOSIS — K589 Irritable bowel syndrome without diarrhea: Secondary | ICD-10-CM | POA: Insufficient documentation

## 2016-08-01 HISTORY — DX: Irritable bowel syndrome, unspecified: K58.9

## 2016-08-01 HISTORY — DX: Personal history of other endocrine, nutritional and metabolic disease: Z86.39

## 2016-08-02 ENCOUNTER — Encounter: Payer: Self-pay | Admitting: Rheumatology

## 2016-08-02 ENCOUNTER — Ambulatory Visit (INDEPENDENT_AMBULATORY_CARE_PROVIDER_SITE_OTHER): Payer: 59 | Admitting: Rheumatology

## 2016-08-02 VITALS — BP 125/67 | HR 64 | Resp 12 | Ht 67.0 in | Wt 184.0 lb

## 2016-08-02 DIAGNOSIS — Z79899 Other long term (current) drug therapy: Secondary | ICD-10-CM

## 2016-08-02 DIAGNOSIS — M0579 Rheumatoid arthritis with rheumatoid factor of multiple sites without organ or systems involvement: Secondary | ICD-10-CM

## 2016-08-02 DIAGNOSIS — Z8619 Personal history of other infectious and parasitic diseases: Secondary | ICD-10-CM | POA: Diagnosis not present

## 2016-08-02 DIAGNOSIS — M19042 Primary osteoarthritis, left hand: Secondary | ICD-10-CM | POA: Diagnosis not present

## 2016-08-02 DIAGNOSIS — M19041 Primary osteoarthritis, right hand: Secondary | ICD-10-CM | POA: Diagnosis not present

## 2016-08-02 NOTE — Progress Notes (Signed)
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 female . She reports having fewer flares since the last visit. Some were in her right shoulder wrist joints, hands and feet. She's been spacing her simple knee injections every 5 weeks which could be contributing to the flares. She's been having increasing stiffness in the morning. She had some leg length discrepancy for which she is using a shoe lift. She was having discomfort in her right thigh prior to using the left. She's been also having some discomfort in her right lateral epicondyle area especially when she grips objects.  Activities of Daily Living:  Patient reports morning stiffness for 10 minutes.   Patient Reports nocturnal pain.  Difficulty dressing/grooming: Denies Difficulty climbing stairs: Denies Difficulty getting out of chair: Denies Difficulty using hands for taps, buttons, cutlery, and/or writing: Denies   Review of Systems  Constitutional: Positive for fatigue. Negative for night sweats, weight gain, weight loss and weakness.  HENT: Negative for mouth sores, trouble swallowing, trouble swallowing, mouth dryness and nose dryness.   Eyes: Negative for pain, redness, visual disturbance and dryness.  Respiratory: Negative for cough, shortness of breath and difficulty breathing.   Cardiovascular: Negative for chest pain, palpitations, hypertension, irregular heartbeat and swelling in legs/feet.  Gastrointestinal: Negative for blood in stool, constipation and diarrhea.  Endocrine: Negative for increased urination.  Genitourinary: Negative for vaginal dryness.  Musculoskeletal: Positive for arthralgias, joint pain and morning stiffness. Negative for  joint swelling, myalgias, muscle weakness, muscle tenderness and myalgias.  Skin: Positive for color change. Negative for rash, hair loss, skin tightness, ulcers and sensitivity to sunlight.  Allergic/Immunologic: Negative for susceptible to infections.  Neurological: Negative for dizziness, memory loss and night sweats.  Hematological: Negative for swollen glands.  Psychiatric/Behavioral: Negative for depressed mood and sleep disturbance. The patient is not nervous/anxious.     PMFS History:  Patient Active Problem List   Diagnosis Date Noted  . History of shingles 08/02/2016  . High risk medication use 08/02/2016  . Hypertension 08/01/2016  . IBS (irritable bowel syndrome) 08/01/2016  . Anxiety 08/01/2016  . 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 Rate-related bundle branch block 01/30/2015  . Palpitations 01/30/2015  . Pulmonary nodule 07/04/2011  . Murmur 02/03/2011  . Rheumatoid arthritis ([facility]) 02/03/2011  . Hair loss 02/03/2011  . Fatigue 02/03/2011    Past Medical History:  Diagnosis Date  . Anxiety   . Breast lump    stable-- gets annual mammogram  . 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 Hypertension   . IBS (irritable bowel syndrome) 08/01/2016  . Migraine   . Murmur 02/03/2011  . Ovarian failure   . Pulmonary nodule, left 2009   last CT Nov 2011-- due 08/2011  . Rheumatoid arthritis(714.0)    in remission , off meds as of Summer 2012    Family History  Problem Relation Age of Onset  . Alcohol abuse Father   . Hyperlipidemia Father   . Heart disease Father   . Alcohol abuse Brother   .  Diabetes Brother   . Hypertension Brother   . Arthritis Mother   . Uterine cancer Mother   . Hypertension Mother   . Heart disease Sister     cardiac stent  . Diabetes Sister   . Hyperlipidemia Sister   . Hypertension Sister   . Cancer Brother     lung  . Cancer Maternal Aunt     lung----non smoker  . Uterine cancer Sister    Past Surgical  History:  Procedure Laterality Date  . CHOLECYSTECTOMY  1999  . COLONOSCOPY  10/31/11  . EXPLORATORY LAPAROTOMY    . TONSILLECTOMY  1969  . WISDOM TOOTH EXTRACTION     Social History   Social History Narrative   Exercise-- jogs     Objective: Vital Signs: BP 125/67 (BP Location: Left Arm, Patient Position: Sitting, Cuff Size: Large)   Pulse 64   Resp 12   Ht 5\' 7"  (1.702 m)   Wt 184 lb (83.5 kg)   LMP 10/03/2005   BMI 28.82 kg/m    Physical Exam  Constitutional: She is oriented to person, place, and time. She appears well-developed and well-nourished.  HENT:  Head: Normocephalic and atraumatic.  Eyes: Conjunctivae and EOM are normal.  Neck: Normal range of motion.  Cardiovascular: Normal rate, regular rhythm, normal heart sounds and intact distal pulses.   Pulmonary/Chest: Effort normal and breath sounds normal.  Abdominal: Soft. Bowel sounds are normal.  Lymphadenopathy:    She has no cervical adenopathy.  Neurological: She is alert and oriented to person, place, and time.  Skin: Skin is warm and dry. Capillary refill takes 2 to 3 seconds.  Psychiatric: She has a normal mood and affect. Her behavior is normal.     Musculoskeletal Exam: Good range of motion of her C-spine and thoracic lumbar spine and shoulders elbow joints wrist joints, MCP joints, PIP joints DIP joints with good range of motion with no synovitis she has prominence off bilateral PIP/DIP and CMC joints consistent with osteoarthritis, no synovitis was noted. She had tenderness on palpation of her right lateral epicondyle area consistent with lateral epicondylitis. Hip joints knee joints, ankle joints are good range of motion she has thickening of MTP joints consistent with osteoarthritis. No synovitis on lower extremities was noted. CDAI Exam: CDAI Homunculus Exam:   Joint Counts:  CDAI Tender Joint count: 0 CDAI Swollen Joint count: 0  Global Assessments:  Patient Global Assessment: 1 Provider Global  Assessment: 1  CDAI Calculated Score: 2    Investigation: Findings:  September 2016 TB gold negative' March 2016 pneumococcal vaccine done' 02/04/2016 CMP normal CBC normal, 07/01/2016 TB gold was negative which was done at her work.    Imaging: No results found.  Speciality Comments: No specialty comments available.    Procedures:  No procedures performed Allergies: Other   Assessment / Plan: Visit Diagnoses:  Rheumatoid arthritis involving multiple sites with positive rheumatoid factor (HCC) - +RF+CCP, she has no synovitis on examination today. She's been tolerating symphony injections well. We discussed taking symphony every 4 weeks due to increase arthralgias.  High risk medication use: Her labs have been within normal limits. TB gold September 2017 was normal.  Right lateral epicondylitis: I advised use of Voltaren gel. We will give a handout on lateral epicondylitis handout.  Right thigh pain related to leg length discrepancy  Primary osteoarthritis of both hands had: Joint protection and muscle strengthening was discussed.    Orders: Orders Placed This Encounter  Procedures  .  CBC with Differential/Platelet  . COMPLETE METABOLIC PANEL WITH GFR     Face-to-face time spent with patient was 30 minutes. 50% of time was spent in counseling and coordination of care.  Follow-Up Instructions: No Follow-up on file.

## 2016-08-02 NOTE — Patient Instructions (Signed)
Standing Labs We placed an order today for your standing lab work.    Please come back and get your standing labs in January and every 3 months. Medial Epicondylitis With Rehab Medial epicondylitis involves inflammation and pain around the inner (medial) portion of the elbow. This pain is caused by inflammation of the tendons in the forearm that flex (bring down) the wrist. Medial epicondylitis is also called golfer's elbow, because it is common among golfers. However, it may occur in any individual who flexes the wrist regularly. If medial epicondylitis is left untreated, it may become a chronic problem. SYMPTOMS   Pain, tenderness, or inflammation over the inner (medial) side of the elbow.  Pain or weakness with gripping activities.  Pain that increases with wrist twisting motions (using a screwdriver, playing golf, bowling). CAUSES  Medial epicondylitis is caused by inflammation of the tendons that flex the wrist. Causes of injury may include:  Chronic, repetitive stress and strain to the tendons that run from the wrist and forearm to the elbow.  Sudden strain on the forearm, including wrist snap when serving balls with racquet sports, or throwing a baseball. RISK INCREASES WITH:  Sports or occupations that require repetitive and/or strenuous forearm and wrist movements (pitching a baseball, golfing, carpentry).  Poor wrist and forearm strength and flexibility.  Failure to warm up properly before activity.  Resuming activity before healing, rehabilitation, and conditioning are complete. PREVENTION   Warm up and stretch properly before activity.  Maintain physical fitness:  Strength, flexibility, and endurance.  Cardiovascular fitness.  Wear and use properly fitted equipment.  Learn and use proper technique and have a coach correct improper technique.  Wear a tennis elbow (counterforce) brace. PROGNOSIS  The course of this condition depends on the degree of the injury.  If treated properly, acute cases (symptoms lasting less than 4 weeks) are often resolved in 2 to 6 weeks. Chronic (longer lasting cases) often resolve in 3 to 6 months, but may require physical therapy. RELATED COMPLICATIONS   Frequently recurring symptoms, resulting in a chronic problem. Properly treating the problem the first time decreases frequency of recurrence.  Chronic inflammation, scarring, and partial tendon tear, requiring surgery.  Delayed healing or resolution of symptoms. TREATMENT  Treatment first involves the use of ice and medicine, to reduce pain and inflammation. Strengthening and stretching exercises may reduce discomfort, if performed regularly. These exercises may be performed at home, if the condition is an acute injury. Chronic cases may require a referral to a physical therapist for evaluation and treatment. Your caregiver may advise a corticosteroid injection to help reduce inflammation. Rarely, surgery is needed. MEDICATION  If pain medicine is needed, nonsteroidal anti-inflammatory medicines (aspirin and ibuprofen), or other minor pain relievers (acetaminophen), are often advised.  Do not take pain medicine for 7 days before surgery.  Prescription pain relievers may be given, if your caregiver thinks they are needed. Use only as directed and only as much as you need.  Corticosteroid injections may be recommended. These injections should be reserved only for the most severe cases, because they can only be given a certain number of times. HEAT AND COLD  Cold treatment (icing) should be applied for 10 to 15 minutes every 2 to 3 hours for inflammation and pain, and immediately after activity that aggravates your symptoms. Use ice packs or an ice massage.  Heat treatment may be used before performing stretching and strengthening activities prescribed by your caregiver, physical therapist, or athletic trainer. Use a heat  pack or a warm water soak. SEEK MEDICAL CARE  IF: Symptoms get worse or do not improve in 2 weeks, despite treatment. EXERCISES  RANGE OF MOTION (ROM) AND STRETCHING EXERCISES - Epicondylitis, Medial (Golfer's Elbow) These exercises may help you when beginning to rehabilitate your injury. Your symptoms may go away with or without further involvement from your physician, physical therapist or athletic trainer. While completing these exercises, remember:   Restoring tissue flexibility helps normal motion to return to the joints. This allows healthier, less painful movement and activity.  An effective stretch should be held for at least 30 seconds.  A stretch should never be painful. You should only feel a gentle lengthening or release in the stretched tissue. RANGE OF MOTION - Wrist Flexion, Active-Assisted  Extend your right / left elbow with your fingers pointing down.*  Gently pull the back of your hand towards you, until you feel a gentle stretch on the top of your forearm.  Hold this position for __________ seconds. Repeat __________ times. Complete this exercise __________ times per day.  *If directed by your physician, physical therapist or athletic trainer, complete this stretch with your elbow bent, rather than extended. RANGE OF MOTION - Wrist Extension, Active-Assisted  Extend your right / left elbow and turn your palm upwards.*  Gently pull your palm and fingertips back, so your wrist extends and your fingers point more toward the ground.  You should feel a gentle stretch on the inside of your forearm.  Hold this position for __________ seconds. Repeat __________ times. Complete this exercise __________ times per day. *If directed by your physician, physical therapist or athletic trainer, complete this stretch with your elbow bent, rather than extended. STRETCH - Wrist Extension   Place your right / left fingertips on a tabletop leaving your elbow slightly bent. Your fingers should point backwards.  Gently press your  fingers and palm down onto the table, by straightening your elbow. You should feel a stretch on the inside of your forearm.  Hold this position for __________ seconds. Repeat __________ times. Complete this stretch __________ times per day.  STRENGTHENING EXERCISES - Epicondylitis, Medial (Golfer's Elbow) These exercises may help you when beginning to rehabilitate your injury. They may resolve your symptoms with or without further involvement from your physician, physical therapist or athletic trainer. While completing these exercises, remember:   Muscles can gain both the endurance and the strength needed for everyday activities through controlled exercises.  Complete these exercises as instructed by your physician, physical therapist or athletic trainer. Increase the resistance and repetitions only as guided.  You may experience muscle soreness or fatigue, but the pain or discomfort you are trying to eliminate should never worsen during these exercises. If this pain does get worse, stop and make sure you are following the directions exactly. If the pain is still present after adjustments, discontinue the exercise until you can discuss the trouble with your caregiver. STRENGTH - Wrist Flexors  Sit with your right / left forearm palm-up, and fully supported on a table or countertop. Your elbow should be resting below the height of your shoulder. Allow your wrist to extend over the edge of the surface.  Loosely holding a __________ weight, or a piece of rubber exercise band or tubing, slowly curl your hand up toward your forearm.  Hold this position for __________ seconds. Slowly lower the wrist back to the starting position in a controlled manner. Repeat __________ times. Complete this exercise __________ times per day.  STRENGTH - Wrist Extensors  Sit with your right / left forearm palm-down and fully supported. Your elbow should be resting below the height of your shoulder. Allow your wrist  to extend over the edge of the surface.  Loosely holding a __________ weight, or a piece of rubber exercise band or tubing, slowly curl your hand up toward your forearm.  Hold this position for __________ seconds. Slowly lower the wrist back to the starting position in a controlled manner. Repeat __________ times. Complete this exercise __________ times per day.  STRENGTH - Ulnar Deviators  Stand with a ____________________ weight in your right / left hand, or sit while holding a rubber exercise band or tubing, with your healthy arm supported on a table or countertop.  Move your wrist so that your pinkie travels toward your forearm and your thumb moves away from your forearm.  Hold this position for __________ seconds and then slowly lower the wrist back to the starting position. Repeat __________ times. Complete this exercise __________ times per day STRENGTH - Grip   Grasp a tennis ball, a dense sponge, or a large, rolled sock in your hand.  Squeeze as hard as you can, without increasing any pain.  Hold this position for __________ seconds. Release your grip slowly. Repeat __________ times. Complete this exercise __________ times per day.  STRENGTH - Forearm Supinators   Sit with your right / left forearm supported on a table, keeping your elbow below shoulder height. Rest your hand over the edge, palm down.  Gently grip a hammer or a soup ladle.  Without moving your elbow, slowly turn your palm and hand upward to a "thumbs-up" position.  Hold this position for __________ seconds. Slowly return to the starting position. Repeat __________ times. Complete this exercise __________ times per day.  STRENGTH - Forearm Pronators  Sit with your right / left forearm supported on a table, keeping your elbow below shoulder height. Rest your hand over the edge, palm up.  Gently grip a hammer or a soup ladle.  Without moving your elbow, slowly turn your palm and hand upward to a  "thumbs-up" position.  Hold this position for __________ seconds. Slowly return to the starting position. Repeat __________ times. Complete this exercise __________ times per day.    This information is not intended to replace advice given to you by your health care provider. Make sure you discuss any questions you have with your health care provider.   Document Released: 09/19/2005 Document Revised: 10/10/2014 Document Reviewed: 01/01/2009 Elsevier Interactive Patient Education [employer].   We have open lab Monday through Friday from 8:30-11:30 AM and 1-4 PM at the office of Dr. Tresa Moore, PA.   The office is located at [address], [place], [place], [place] [address] No appointment is necessary.   Labs are drawn by [facility].  You may receive a bill from [facility] for your lab work.

## 2016-08-30 ENCOUNTER — Telehealth: Payer: Self-pay | Admitting: Rheumatology

## 2016-08-30 NOTE — Telephone Encounter (Signed)
Please resend Simponi injection rx to [facility]. Patient states she is due for injection tomorrow

## 2016-08-31 ENCOUNTER — Other Ambulatory Visit: Payer: Self-pay | Admitting: Pharmacist

## 2016-08-31 MED ORDER — GOLIMUMAB 50 MG/0.5ML ~~LOC~~ SOAJ: 50.0000 mg | SUBCUTANEOUS | 0 refills | Status: DC

## 2016-08-31 MED FILL — SIMPONI 50 MG/0.5ML SOAJ: 50 | 30 days supply | Qty: 1 | Fill #0

## 2016-08-31 NOTE — Telephone Encounter (Signed)
Last visit 08/02/16 Next visit 12/28/15 Labs 07/2016 WNL TB neg 04/2016 Ok to refill per Dr Estanislado Pandy

## 2016-09-21 MED FILL — SIMPONI 50 MG/0.5ML SOAJ: 50 | 30 days supply | Qty: 1 | Fill #1

## 2016-10-11 MED FILL — AMOXICILLIN 875 MG TABLET: 875 | 10 days supply | Qty: 20 | Fill #0

## 2016-10-12 ENCOUNTER — Telehealth: Payer: Self-pay

## 2016-10-12 NOTE — Telephone Encounter (Signed)
Left message for patient to call me back. She is due for a refill for Simponi at [facility] on 1/17. Called to see if she would like for me to que the RX to be filled then.   Biana Haggar, [place], CPhT

## 2016-11-02 MED FILL — CLINDAMYCIN HCL 300 MG CAPS: 300 | 10 days supply | Qty: 30 | Fill #0

## 2016-11-14 ENCOUNTER — Encounter: Payer: Self-pay | Admitting: Pharmacist

## 2016-11-15 NOTE — Progress Notes (Signed)
PA was submitted to [employer] for patient's Simponi.  Received fax from [facility] asking: "Has the patient experienced or maintained a 20% or greater improvement in tender joint count or swollen joint count while on therapy?" and "Will the patient be taking methotrexate with Simponi?"  Letter was written and signed by Mr. Carlyon Shadow.  Faxed letter to [facility] at [phone].

## 2016-11-16 ENCOUNTER — Telehealth: Payer: Self-pay | Admitting: Rheumatology

## 2016-11-16 MED FILL — SIMPONI 50 MG/0.5ML SOAJ: 50 | 30 days supply | Qty: 1 | Fill #2

## 2016-11-16 NOTE — Telephone Encounter (Signed)
Patient called and stated that she needs prior authorization for her Simphoni injection.  [phone].  Thank you

## 2016-11-16 NOTE — Telephone Encounter (Signed)
Left message to let patient know that we have submitted her prior authorization (11/15/16) and we will contact her with any updates that we may receive. Thank you.

## 2016-11-18 ENCOUNTER — Telehealth: Payer: Self-pay

## 2016-11-18 NOTE — Telephone Encounter (Signed)
Received a fax from [facility] regarding a prior authorization approval for [place] from 11/17/16 to 11/16/17.   Reference number:[id number] Phone number:[phone]  Will send document to [employer].  Left message for patient with an update and asked her to call me if she had any questions.   Adien Kimmel, [place], CPhT   8:13 AM

## 2016-12-12 MED FILL — CLINDAMYCIN 75 MG/5 ML SOLN: 75 | 7 days supply | Qty: 200 | Fill #0

## 2016-12-13 DIAGNOSIS — H524 Presbyopia: Secondary | ICD-10-CM | POA: Diagnosis not present

## 2016-12-22 DIAGNOSIS — M19041 Primary osteoarthritis, right hand: Secondary | ICD-10-CM

## 2016-12-22 DIAGNOSIS — M19042 Primary osteoarthritis, left hand: Secondary | ICD-10-CM | POA: Insufficient documentation

## 2016-12-22 HISTORY — DX: Primary osteoarthritis, right hand: M19.041

## 2016-12-22 NOTE — Progress Notes (Signed)
Office Visit Note  Patient: Sharon Hunter             Date of Birth: 04-21-1961           MRN: [id number]             PCP: [name], DO Referring: [name], * Visit Date: 12/27/2016 Occupation: @GUAROCC @    Subjective:  Pain hands   History of Present Illness: Sharon Hunter is a 56 y.o. female with history of rheumatoid arthritis and osteoarthritis. She states that she had to delay her Simponi injection by 2 weeks due to dental abscess. She had to take 2 courses of antibiotics. She is having a flare with increased pain and swelling in her right hand. She's also having some discomfort in her bilateral feet as she feels that she puts more pressure on the bilateral fifth toe. She recently developed a rash over her eyelids which she thought could have been psoriasis. She uses topical steroid cream and it eventually resolved.  Activities of Daily Living:  Patient reports morning stiffness for 10 minutes.   Patient Reports nocturnal pain.  Difficulty dressing/grooming: Denies Difficulty climbing stairs: Denies Difficulty getting out of chair: Denies Difficulty using hands for taps, buttons, cutlery, and/or writing: Denies   Review of Systems  Constitutional: Positive for fatigue. Negative for night sweats, weight gain, weight loss and weakness.  HENT: Positive for nose dryness. Negative for mouth sores, trouble swallowing and trouble swallowing.   Eyes: Positive for dryness. Negative for pain, redness and visual disturbance.  Respiratory: Negative for cough, shortness of breath and difficulty breathing.   Cardiovascular: Negative for chest pain, palpitations, hypertension, irregular heartbeat and swelling in legs/feet.  Gastrointestinal: Negative for blood in stool, constipation and diarrhea.  Endocrine: Negative for increased urination.  Genitourinary: Negative for vaginal dryness.  Musculoskeletal: Positive for arthralgias, joint pain, joint swelling and  morning stiffness. Negative for myalgias, muscle weakness, muscle tenderness and myalgias.  Skin: Negative for color change, hair loss, skin tightness, ulcers and sensitivity to sunlight.  Allergic/Immunologic: Negative for susceptible to infections.  Neurological: Negative for dizziness, memory loss and night sweats.  Hematological: Negative for swollen glands.  Psychiatric/Behavioral: Positive for sleep disturbance. Negative for depressed mood. The patient is not nervous/anxious.     PMFS History:  Patient Active Problem List   Diagnosis Date Noted  . Primary osteoarthritis of both hands 12/22/2016  . History of shingles 08/02/2016  . High risk medication use 08/02/2016  . Hypertension 08/01/2016  . IBS (irritable bowel syndrome) 08/01/2016  . Anxiety 08/01/2016  . 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 Rate-related bundle branch block 01/30/2015  . Palpitations 01/30/2015  . Pulmonary nodule 07/04/2011  . Murmur 02/03/2011  . Rheumatoid arthritis ([place]) 02/03/2011  . Hair loss 02/03/2011  . Fatigue 02/03/2011    Past Medical History:  Diagnosis Date  . Anxiety   . Breast lump    stable-- gets annual mammogram  . 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 Hypertension   . IBS (irritable bowel syndrome) 08/01/2016  . Migraine   . Murmur 02/03/2011  . Ovarian failure   . Pulmonary nodule, left 2009   last CT Nov 2011-- due 08/2011  . Rheumatoid arthritis(714.0)    in remission , off meds as of Summer 2012    Family History  Problem Relation Age of Onset  . Alcohol abuse Father   . Hyperlipidemia Father   . Heart disease  Father   . Alcohol abuse Brother   . Diabetes Brother   . Hypertension Brother   . Arthritis Mother   . Uterine cancer Mother   . Hypertension Mother   . Heart disease Sister     cardiac stent  . Diabetes Sister   . Hyperlipidemia Sister   . Hypertension Sister   . Cancer Brother     lung  . Cancer Maternal Aunt     lung----non  smoker  . Uterine cancer Sister    Past Surgical History:  Procedure Laterality Date  . CHOLECYSTECTOMY  1999  . COLONOSCOPY  10/31/11  . EXPLORATORY LAPAROTOMY    . TONSILLECTOMY  1969  . WISDOM TOOTH EXTRACTION     Social History   Social History Narrative   Exercise-- jogs     Objective: Vital Signs: BP 136/80   Resp 16   Ht 5\' 7"  (1.702 m)   Wt 183 lb (83 kg)   LMP 10/03/2005   BMI 28.66 kg/m    Physical Exam  Constitutional: She is oriented to person, place, and time. She appears well-developed and well-nourished.  HENT:  Head: Normocephalic and atraumatic.  Eyes: Conjunctivae and EOM are normal.  Neck: Normal range of motion.  Cardiovascular: Normal rate, regular rhythm, normal heart sounds and intact distal pulses.   Pulmonary/Chest: Effort normal and breath sounds normal.  Abdominal: Soft. Bowel sounds are normal.  Lymphadenopathy:    She has no cervical adenopathy.  Neurological: She is alert and oriented to person, place, and time.  Skin: Skin is warm and dry. Capillary refill takes less than 2 seconds.  Psychiatric: She has a normal mood and affect. Her behavior is normal.  Nursing note and vitals reviewed.    Musculoskeletal Exam: C-spine and thoracic lumbar spine good range of motion. She is good range of motion of bilateral shoulder joints and elbow joints. She has limitation of motion of bilateral wrist joints. She synovial thickening over bilateral MCPs PIPs joints and DIP joints. She is mild synovitis over her right first MCP joint. Hip joints knee joints ankles were good range of motion. She is thickening of bilateral MTP joints with discomfort on palpation over bilateral fifth MTP joints. She is also over pronator.  CDAI Exam: CDAI Homunculus Exam:   Tenderness:  Right hand: 1st MCP Right foot: 5th MTP Left foot: 1st MTP  Swelling:  Right hand: 1st MCP  Joint Counts:  CDAI Tender Joint count: 1 CDAI Swollen Joint count: 1  Global  Assessments:  Patient Global Assessment: 1 Provider Global Assessment: 1  CDAI Calculated Score: 4    Investigation: Findings:   September 2016 TB gold negative' March 2016 pneumococcal vaccine done' 02/04/2016 CMP normal CBC normal, 07/01/2016 TB gold was negative which was done at her work.   February 2017:  CBC and comprehensive metabolic panel was normal.  Her TB Gold was negative in September of 2016.     02/04/2016 We did obtain x-ray of bilateral hands, 2-views, today, which showed minimum PIP, DIP, CMC narrowing with no MCP changes, no intracarpal joint changes, and no erosive changes.     Bilateral feet x-rays showed bilateral first MTP narrowing and subluxation, right second and fifth metatarsal narrowing, erosive changes in the right first, fifth, and left fifth MTP joints.  I could not pull her previous films for comparison today, although I did review the x-rays with her with the erosive changes.    4/2/204 CBC, comprehensive, urinalysis and sed rate, ,  ANA, hep panel, TB Gold and vitamin D levels normal; RF factor elevated 34 CCP elevated 202.5     Imaging: No results found.  Speciality Comments: No specialty comments available.    Procedures:  No procedures performed Allergies: Other and Contrast media [iodinated diagnostic agents]   Assessment / Plan:     Visit Diagnoses: Rheumatoid arthritis involving multiple sites with positive rheumatoid factor (HCC) - Positive RF, positive anti-CCP, erosive disease. She is having a flare due to delaying her Simponi for dental abscess. She synovitis over her right first MCP joint today and increase discomfort in her MTP joints.  High risk medication use - Simponi subcutaneous every month(April 2016)(intolerance to methotrexate, Remicade, Orencia, Enbrel) - Plan: CBC with Differential/Platelet, COMPLETE METABOLIC PANEL WITH GFR. We'll check labs today and then every 3 months to monitor for drug toxicity  Primary osteoarthritis  of both hands: She does have some stiffness in her hands due to osteoarthritis  Pain in both feet: She has MTP thickening and she over pronates her feet which causes discomfort over her bilateral fifth MTP joints. Proper fitting shoes with shoe inserts were discussed.  Other medical problems are listed as follows:  Other fatigue  Hair loss  History of primary ovarian failure  History of shingles  History of hypertension  History of IBS  History of solitary pulmonary nodule    Orders: Orders Placed This Encounter  Procedures  . CBC with Differential/Platelet  . COMPLETE METABOLIC PANEL WITH GFR   No orders of the defined types were placed in this encounter.   Face-to-face time spent with patient was 30 minutes. 50% of time was spent in counseling and coordination of care.  Follow-Up Instructions: Return in about 5 months (around 05/29/2017) for Rheumatoid arthritis.   Bo Merino, MD  Note - This record has been created using Editor, commissioning.  Chart creation errors have been sought, but may not always  have been located. Such creation errors do not reflect on  the standard of medical care.

## 2016-12-27 ENCOUNTER — Ambulatory Visit (INDEPENDENT_AMBULATORY_CARE_PROVIDER_SITE_OTHER): Payer: 59 | Admitting: Rheumatology

## 2016-12-27 ENCOUNTER — Encounter: Payer: Self-pay | Admitting: Rheumatology

## 2016-12-27 VITALS — BP 136/80 | Resp 16 | Ht 67.0 in | Wt 183.0 lb

## 2016-12-27 DIAGNOSIS — Z8619 Personal history of other infectious and parasitic diseases: Secondary | ICD-10-CM | POA: Diagnosis not present

## 2016-12-27 DIAGNOSIS — M19042 Primary osteoarthritis, left hand: Secondary | ICD-10-CM | POA: Diagnosis not present

## 2016-12-27 DIAGNOSIS — M0579 Rheumatoid arthritis with rheumatoid factor of multiple sites without organ or systems involvement: Secondary | ICD-10-CM | POA: Diagnosis not present

## 2016-12-27 DIAGNOSIS — L659 Nonscarring hair loss, unspecified: Secondary | ICD-10-CM

## 2016-12-27 DIAGNOSIS — Z8639 Personal history of other endocrine, nutritional and metabolic disease: Secondary | ICD-10-CM

## 2016-12-27 DIAGNOSIS — Z8719 Personal history of other diseases of the digestive system: Secondary | ICD-10-CM | POA: Diagnosis not present

## 2016-12-27 DIAGNOSIS — M79671 Pain in right foot: Secondary | ICD-10-CM

## 2016-12-27 DIAGNOSIS — M19041 Primary osteoarthritis, right hand: Secondary | ICD-10-CM | POA: Diagnosis not present

## 2016-12-27 DIAGNOSIS — Z79899 Other long term (current) drug therapy: Secondary | ICD-10-CM

## 2016-12-27 DIAGNOSIS — Z8679 Personal history of other diseases of the circulatory system: Secondary | ICD-10-CM

## 2016-12-27 DIAGNOSIS — Z87898 Personal history of other specified conditions: Secondary | ICD-10-CM

## 2016-12-27 DIAGNOSIS — R5383 Other fatigue: Secondary | ICD-10-CM | POA: Diagnosis not present

## 2016-12-27 DIAGNOSIS — M79672 Pain in left foot: Secondary | ICD-10-CM

## 2016-12-27 LAB — CBC WITH DIFFERENTIAL/PLATELET
BASOS PCT: 0 %
Basophils Absolute: 0 cells/uL (ref 0–200)
EOS PCT: 2 %
Eosinophils Absolute: 148 cells/uL (ref 15–500)
HCT: 41 % (ref 35.0–45.0)
Hemoglobin: 13.5 g/dL (ref 11.7–15.5)
LYMPHS PCT: 39 %
Lymphs Abs: 2886 cells/uL (ref 850–3900)
MCH: 30.2 pg (ref 27.0–33.0)
MCHC: 32.9 g/dL (ref 32.0–36.0)
MCV: 91.7 fL (ref 80.0–100.0)
MPV: 10.2 fL (ref 7.5–12.5)
Monocytes Absolute: 666 cells/uL (ref 200–950)
Monocytes Relative: 9 %
NEUTROS PCT: 50 %
Neutro Abs: 3700 cells/uL (ref 1500–7800)
Platelets: 277 10*3/uL (ref 140–400)
RBC: 4.47 MIL/uL (ref 3.80–5.10)
RDW: 13.4 % (ref 11.0–15.0)
WBC: 7.4 10*3/uL (ref 3.8–10.8)

## 2016-12-27 NOTE — Patient Instructions (Signed)
Standing Labs We placed an order today for your standing lab work.    Please come back and get your standing labs in June 2018 and every 3 months.    We have open lab Monday through Friday from 8:30-11:30 AM and 1:30-4 PM at the office of Dr. Shaili Deveshwar/Naitik Panwala, PA.   The office is located at [address], [address], [place], [place] [address] No appointment is necessary.   Labs are drawn by [facility].  You may receive a bill from [facility] for your lab work.     

## 2016-12-27 NOTE — Progress Notes (Signed)
Rheumatology Medication Review by a Pharmacist Does the patient feel that his/her medications are working for him/her?  Yes Has the patient been experiencing any side effects to the medications prescribed?  No Does the patient have any problems obtaining medications?  No  Issues to address at subsequent visits: None   Pharmacist comments:  Sharon Hunter is a pleasant 56 yo F who presents for follow up of her rheumatoid arthritis.  She reports taking Simponi 50 mg every month.  She reports she recently completed a course of antibiotics one week ago, and is past due for her Simponi injection.  Her most recent standing labs were on 07/26/16 which were normal.  She is past due for standing labs today.  TB Gold is done through her work.  Most recent TB Gold was negative on 06/29/16.  She will be due for TB Gold again in September 2018.  Patient denies any questions or concerns regarding her medications at this time.   Elisabeth Most, Pharm.D., BCPS, CPP Clinical Pharmacist Pager: [phone] Phone: [phone] 12/27/2016 3:47 PM

## 2016-12-28 LAB — COMPLETE METABOLIC PANEL WITH GFR
ALT: 21 U/L (ref 6–29)
AST: 12 U/L (ref 10–35)
Albumin: 4.2 g/dL (ref 3.6–5.1)
Alkaline Phosphatase: 62 U/L (ref 33–130)
BUN: 22 mg/dL (ref 7–25)
CALCIUM: 9.4 mg/dL (ref 8.6–10.4)
CO2: 25 mmol/L (ref 20–31)
Chloride: 108 mmol/L (ref 98–110)
Creat: 1.06 mg/dL — ABNORMAL HIGH (ref 0.50–1.05)
GFR, Est African American: 68 mL/min (ref >=60)
GFR, Est Non African American: 59 mL/min — ABNORMAL LOW (ref >=60)
GLUCOSE: 75 mg/dL (ref 65–99)
POTASSIUM: 4.4 mmol/L (ref 3.5–5.3)
Sodium: 141 mmol/L (ref 135–146)
Total Bilirubin: 0.3 mg/dL (ref 0.2–1.2)
Total Protein: 6.8 g/dL (ref 6.1–8.1)

## 2016-12-28 NOTE — Progress Notes (Signed)
Elevated creatinine. Patient had been on antibiotics for dental abscess that may be related to that. Please notify patient and faxed to her PCP

## 2016-12-29 ENCOUNTER — Other Ambulatory Visit: Payer: Self-pay | Admitting: *Deleted

## 2016-12-29 MED ORDER — GOLIMUMAB 50 MG/0.5ML ~~LOC~~ SOAJ: 50.0000 mg | SUBCUTANEOUS | 0 refills | Status: DC

## 2016-12-29 NOTE — Telephone Encounter (Signed)
ok 

## 2016-12-29 NOTE — Telephone Encounter (Signed)
Last Visit: 12/27/16 Next visit: 05/30/17 Labs: 12/27/16 Elevated Creat 1.06 TB Gold: 06/29/2016 Neg   Okay to refill Simponi?

## 2017-01-02 ENCOUNTER — Other Ambulatory Visit: Payer: Self-pay | Admitting: Pharmacist

## 2017-01-02 MED ORDER — GOLIMUMAB 50 MG/0.5ML ~~LOC~~ SOAJ: 50.0000 mg | SUBCUTANEOUS | 0 refills | Status: DC

## 2017-01-03 MED FILL — SIMPONI 50 MG/0.5ML SOAJ: 50 | 28 days supply | Qty: 1 | Fill #0

## 2017-01-25 MED FILL — SIMPONI 50 MG/0.5ML SOAJ: 50 | 28 days supply | Qty: 1 | Fill #1

## 2017-02-07 MED FILL — CHLORHEXIDINE 0.12% RINSE: 0.12 | 30 days supply | Qty: 473 | Fill #0

## 2017-02-07 MED FILL — CLINDAMYCIN 75 MG/5 ML SOLN: 75 | 10 days supply | Qty: 600 | Fill #0

## 2017-02-14 ENCOUNTER — Other Ambulatory Visit: Payer: Self-pay | Admitting: Rheumatology

## 2017-02-15 MED FILL — DICLOFENAC SODIUM 1% GEL: 1 | 30 days supply | Qty: 300 | Fill #0

## 2017-02-15 NOTE — Telephone Encounter (Signed)
Last Visit: 12/27/16 Next Visit: 05/30/17  Okay to refill per Dr. Estanislado Pandy

## 2017-02-21 MED FILL — CLINDAMYCIN 75 MG/5 ML SOLN: 75 | 10 days supply | Qty: 600 | Fill #1

## 2017-03-22 ENCOUNTER — Ambulatory Visit (HOSPITAL_COMMUNITY)
Admission: EM | Admit: 2017-03-22 | Discharge: 2017-03-22 | Disposition: A | Discharge status: Home / Self Care | Payer: 59 | Attending: Family Medicine | Admitting: Family Medicine

## 2017-03-22 ENCOUNTER — Encounter (HOSPITAL_COMMUNITY): Payer: Self-pay | Admitting: Emergency Medicine

## 2017-03-22 DIAGNOSIS — S46912A Strain of unspecified muscle, fascia and tendon at shoulder and upper arm level, left arm, initial encounter: Secondary | ICD-10-CM

## 2017-03-22 MED ORDER — PREDNISONE 20 MG PO TABS: ORAL_TABLET | ORAL | 0 refills | Status: DC

## 2017-03-22 MED ORDER — HYDROCODONE-ACETAMINOPHEN 5-325 MG PO TABS: 1.0000 | ORAL_TABLET | Freq: Four times a day (QID) | ORAL | 0 refills | Status: DC | PRN

## 2017-03-22 NOTE — Discharge Instructions (Signed)
Take the hydrocodone at night to help you sleep while taking the prednisone twice a day for the next 2-3 days. Expect complete resolution by the weekend.

## 2017-03-22 NOTE — ED Provider Notes (Signed)
[facility]    CSN: [id number] Arrival date & time: 03/22/17  1850     History   Chief Complaint Chief Complaint  Patient presents with  . Shoulder Pain    HPI Sharon Hunter is a 56 y.o. female.   The patient presented to the [facility] with a complaint of left shoulder pain x 1 week. The patient denied any known injury. 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 She did fall 2 weeks ago but had no significant sequela. She notes that she has full range of motion of her left shoulder and that the pain is in the trapezius and rhomboid areas of her left shoulder.  Patient does have rheumatoid arthritis and is getting Symphony infusions which are similar to Remicade. She has tried ibuprofen, Naprosyn, massage, and BenGay without success. Patient has had some tingling in her left forearm. She has no limitation of range of motion in her neck.      Past Medical History:  Diagnosis Date  . Anxiety   . Breast lump    stable-- gets annual mammogram  . 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 Hypertension   . IBS (irritable bowel syndrome) 08/01/2016  . Migraine   . Murmur 02/03/2011  . Ovarian failure   . Pulmonary nodule, left 2009   last CT Nov 2011-- due 08/2011  . Rheumatoid arthritis(714.0)    in remission , off meds as of Summer 2012    Patient Active Problem List   Diagnosis Date Noted  . Primary osteoarthritis of both hands 12/22/2016  . History of shingles 08/02/2016  . High risk medication use 08/02/2016  . Hypertension 08/01/2016  . IBS (irritable bowel syndrome) 08/01/2016  . Anxiety 08/01/2016  . 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 Rate-related bundle branch block 01/30/2015  . Palpitations 01/30/2015  . Pulmonary nodule 07/04/2011  . Murmur 02/03/2011  . Rheumatoid arthritis ([place]) 02/03/2011  . Hair loss 02/03/2011  . Fatigue 02/03/2011    Past Surgical History:  Procedure Laterality Date  .  CHOLECYSTECTOMY  1999  . COLONOSCOPY  10/31/11  . EXPLORATORY LAPAROTOMY    . TONSILLECTOMY  1969  . WISDOM TOOTH EXTRACTION      OB History    No data available       Home Medications    Prior to Admission medications   Medication Sig Start Date End Date Taking? Authorizing Provider  diclofenac sodium (VOLTAREN) 1 % GEL APPLY 3 GRAMS TO LARGE JOINTS THREE TIMES DAILY AS NEEDED FOR PAIN 02/15/17  Yes Deveshwar, Abel Presto, MD  Golimumab 50 MG/0.5ML SOAJ Inject 50 mg into the skin every 30 (thirty) days. 01/02/17  Yes Tresa Garter, MD  ibuprofen (ADVIL,MOTRIN) 400 MG tablet Take 400 mg by mouth every 6 (six) hours as needed.   Yes [provider]  naproxen sodium (ANAPROX) 220 MG tablet Take 440 mg by mouth 2 (two) times daily with a meal.   Yes [provider]  traMADol (ULTRAM) 50 MG tablet Take by mouth every 6 (six) hours as needed.   Yes [provider]  HYDROcodone-acetaminophen (NORCO) 5-325 MG tablet Take 1 tablet by mouth every 6 (six) hours as needed for moderate pain. 03/22/17   Robyn Haber, MD  predniSONE (DELTASONE) 20 MG tablet Two daily with food 03/22/17   Robyn Haber, MD    Family History Family History  Problem Relation Age of Onset  .  Alcohol abuse Father   . Hyperlipidemia Father   . Heart disease Father   . Alcohol abuse Brother   . Diabetes Brother   . Hypertension Brother   . Arthritis Mother   . Uterine cancer Mother   . Hypertension Mother   . Heart disease Sister        cardiac stent  . Diabetes Sister   . Hyperlipidemia Sister   . Hypertension Sister   . Cancer Brother        lung  . Cancer Maternal Aunt        lung----non smoker  . Uterine cancer Sister     Social History Social History  Substance Use Topics  . Smoking status: Former Smoker    Quit date: 10/03/1990  . Smokeless tobacco: Never Used  . Alcohol use 1.8 oz/week    3 Standard drinks or equivalent per week     Comment: Red wine 4 oz 4-5  times a week     Allergies   Other and Contrast media [iodinated diagnostic agents]   Review of Systems Review of Systems  Musculoskeletal: Positive for arthralgias.  All other systems reviewed and are negative.    Physical Exam Triage Vital Signs ED Triage Vitals  Enc Vitals Group     BP 03/22/17 1909 (!) 167/70     Pulse Rate 03/22/17 1909 66     Resp 03/22/17 1909 18     Temp 03/22/17 1909 98.3 F (36.8 C)     Temp Source 03/22/17 1909 Oral     SpO2 03/22/17 1909 100 %     Weight --      Height --      Head Circumference --      Peak Flow --      Pain Score 03/22/17 1905 7     Pain Loc --      Pain Edu? --      Excl. in [place]? --    No data found.   Updated Vital Signs BP (!) 167/70 (BP Location: Right Arm)   Pulse 66   Temp 98.3 F (36.8 C) (Oral)   Resp 18   LMP 10/03/2005   SpO2 100%    Physical Exam  Constitutional: She appears well-developed and well-nourished.  HENT:  Head: Normocephalic.  Right Ear: External ear normal.  Left Ear: External ear normal.  Eyes: Conjunctivae are normal. Pupils are equal, round, and reactive to light.  Neck: Normal range of motion. Neck supple.  Cardiovascular: Normal rate and regular rhythm.   Pulmonary/Chest: Effort normal.  Musculoskeletal: Normal range of motion. She exhibits tenderness. She exhibits no deformity.  Mild tenderness in the left rhomboid area and in the left lower paracervical region.  Neurological: She is alert.  Skin: Skin is warm and dry.  Nursing note and vitals reviewed.    UC Treatments / Results  Labs (all labs ordered are listed, but only abnormal results are displayed) Labs Reviewed - No data to display  EKG  EKG Interpretation None       Radiology No results found.  Procedures Procedures (including critical care time)  Medications Ordered in UC Medications - No data to display   Initial Impression / Assessment and Plan / UC Course  I have reviewed the triage vital  signs and the nursing notes.  Pertinent labs & imaging results that were available during my care of the patient were reviewed by me and considered in my medical decision making (see chart for details).  Final Clinical Impressions(s) / UC Diagnoses   Final diagnoses:  Muscle strain of left scapular region, initial encounter    New Prescriptions New Prescriptions   HYDROCODONE-ACETAMINOPHEN (NORCO) 5-325 MG TABLET    Take 1 tablet by mouth every 6 (six) hours as needed for moderate pain.   PREDNISONE (DELTASONE) 20 MG TABLET    Two daily with food     Robyn Haber, MD 03/22/17 1924

## 2017-03-22 NOTE — ED Triage Notes (Signed)
The patient presented to the [facility] with a complaint of left shoulder pain x 1 week. The patient denied any known injury.

## 2017-03-27 ENCOUNTER — Encounter: Payer: Self-pay | Admitting: Rheumatology

## 2017-03-27 ENCOUNTER — Ambulatory Visit (INDEPENDENT_AMBULATORY_CARE_PROVIDER_SITE_OTHER): Payer: 59 | Admitting: Rheumatology

## 2017-03-27 VITALS — BP 124/78 | HR 78 | Resp 16

## 2017-03-27 DIAGNOSIS — Z8619 Personal history of other infectious and parasitic diseases: Secondary | ICD-10-CM

## 2017-03-27 DIAGNOSIS — R911 Solitary pulmonary nodule: Secondary | ICD-10-CM

## 2017-03-27 DIAGNOSIS — M542 Cervicalgia: Secondary | ICD-10-CM

## 2017-03-27 DIAGNOSIS — M0579 Rheumatoid arthritis with rheumatoid factor of multiple sites without organ or systems involvement: Secondary | ICD-10-CM | POA: Diagnosis not present

## 2017-03-27 DIAGNOSIS — Z79899 Other long term (current) drug therapy: Secondary | ICD-10-CM

## 2017-03-27 DIAGNOSIS — Z8659 Personal history of other mental and behavioral disorders: Secondary | ICD-10-CM | POA: Diagnosis not present

## 2017-03-27 DIAGNOSIS — R5383 Other fatigue: Secondary | ICD-10-CM

## 2017-03-27 DIAGNOSIS — M19042 Primary osteoarthritis, left hand: Secondary | ICD-10-CM

## 2017-03-27 DIAGNOSIS — M19041 Primary osteoarthritis, right hand: Secondary | ICD-10-CM

## 2017-03-27 DIAGNOSIS — Z8679 Personal history of other diseases of the circulatory system: Secondary | ICD-10-CM | POA: Diagnosis not present

## 2017-03-27 LAB — CBC WITH DIFFERENTIAL/PLATELET
BASOS ABS: 0 {cells}/uL (ref 0–200)
BASOS PCT: 0 %
EOS ABS: 107 {cells}/uL (ref 15–500)
EOS PCT: 1 %
HCT: 42.1 % (ref 35.0–45.0)
HEMOGLOBIN: 13.9 g/dL (ref 11.7–15.5)
LYMPHS ABS: 4601 {cells}/uL — AB (ref 850–3900)
Lymphocytes Relative: 43 %
MCH: 30.2 pg (ref 27.0–33.0)
MCHC: 33 g/dL (ref 32.0–36.0)
MCV: 91.5 fL (ref 80.0–100.0)
MONOS PCT: 8 %
MPV: 10 fL (ref 7.5–12.5)
Monocytes Absolute: 856 cells/uL (ref 200–950)
NEUTROS ABS: 5136 {cells}/uL (ref 1500–7800)
Neutrophils Relative %: 48 %
PLATELETS: 307 10*3/uL (ref 140–400)
RBC: 4.6 MIL/uL (ref 3.80–5.10)
RDW: 13.8 % (ref 11.0–15.0)
WBC: 10.7 10*3/uL (ref 3.8–10.8)

## 2017-03-27 MED FILL — SIMPONI 50 MG/0.5ML SOAJ: 50 | 28 days supply | Qty: 1 | Fill #2

## 2017-03-27 NOTE — Patient Instructions (Signed)
Standing Labs We placed an order today for your standing lab work.    Please come back and get your standing labs in September and every 3 months  We have open lab Monday through Friday from 8:30-11:30 AM and 1:30-4 PM at the office of Dr. Tresa Moore, PA.   The office is located at [address] [place], [place], [place] [address] No appointment is necessary.   Labs are drawn by [facility].  You may receive a bill from [facility] for your lab work. If you have any questions regarding directions or hours of operation,  please call [phone].

## 2017-03-27 NOTE — Progress Notes (Signed)
Office Visit Note  Patient: Sharon Hunter             Date of Birth: Nov 17, 1960           MRN: [id number]             PCP: [name], DO Referring: [name], * Visit Date: 03/27/2017 Occupation: @GUAROCC @    Subjective:  Pain of the Right Wrist; Pain of the Left Shoulder; and Medication Management (patient has been off Simponi X2 months due to dental procedure was seen at [facility] t'ed with prednisone and hydrocodone )   History of Present Illness: Sharon Hunter is a 56 y.o. female with history of sero positive rheumatoid arthritis. About 8 weeks ago she had a root canal. Which did not work for her. She had to have a gum flap and further surgery. She was on antibiotics for about 20 days. Patient decided to come off Simponi due to the surgery and the antibiotic usage. She's been off Simponi for 2 months now. She states in the meantime she started having pain in her left shoulder. She was going to massage therapist and it did not help. Eventually she went to urgent care last week where she was given prednisone 40 mg a day for 5 days and hydrocodone. She did have some relief from prednisone. She believes that hydrocodone did not work at all. She's been having pain, swelling and discomfort in her right wrist. She's been using diclofenac gel which is helpful. She has noticed a flare of her rheumatoid arthritis. She's been experiencing some numbness on the medial aspect of her left forearm and also left fourth and fifth finger. She's concerned that she may have a disc issue.  Activities of Daily Living:  Patient reports morning stiffness for all day hours.   Patient Reports nocturnal pain.  Difficulty dressing/grooming: Reports Difficulty climbing stairs: Denies Difficulty getting out of chair: Denies Difficulty using hands for taps, buttons, cutlery, and/or writing: Reports   Review of Systems  Constitutional: Positive for fatigue. Negative for night sweats, weight gain,  weight loss and weakness.  HENT: Negative for mouth sores, trouble swallowing, trouble swallowing, mouth dryness and nose dryness.   Eyes: Negative for pain, redness, visual disturbance and dryness.  Respiratory: Negative for cough, shortness of breath and difficulty breathing.   Cardiovascular: Positive for palpitations. Negative for chest pain, hypertension, irregular heartbeat and swelling in legs/feet.  Gastrointestinal: Positive for constipation. Negative for blood in stool and diarrhea.  Endocrine: Negative for increased urination.  Genitourinary: Negative for vaginal dryness.  Musculoskeletal: Positive for arthralgias, joint pain, joint swelling, myalgias, morning stiffness and myalgias. Negative for muscle weakness and muscle tenderness.  Skin: Negative for color change, rash, hair loss, skin tightness, ulcers and sensitivity to sunlight.  Allergic/Immunologic: Negative for susceptible to infections.  Neurological: Negative for dizziness, memory loss and night sweats.  Hematological: Negative for swollen glands.  Psychiatric/Behavioral: Positive for sleep disturbance. Negative for depressed mood. The patient is not nervous/anxious.     PMFS History:  Patient Active Problem List   Diagnosis Date Noted  . History of anxiety 03/27/2017  . History of hypertension 03/27/2017  . History of bundle branch block 03/27/2017  . Primary osteoarthritis of both hands 12/22/2016  . History of shingles 08/02/2016  . High risk medication use 08/02/2016  . Hypertension 08/01/2016  . IBS (irritable bowel syndrome) 08/01/2016  . Anxiety 08/01/2016  . 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  Rate-related bundle branch block 01/30/2015  . Palpitations 01/30/2015  . Pulmonary nodule 07/04/2011  . Murmur 02/03/2011  . Rheumatoid arthritis ([place]) 02/03/2011  . Hair loss 02/03/2011  . Fatigue 02/03/2011    Past Medical History:  Diagnosis Date  . Anxiety   . Breast lump    stable-- gets  annual mammogram  . 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 Hypertension   . IBS (irritable bowel syndrome) 08/01/2016  . Migraine   . Murmur 02/03/2011  . Ovarian failure   . Pulmonary nodule, left 2009   last CT Nov 2011-- due 08/2011  . Rheumatoid arthritis(714.0)    in remission , off meds as of Summer 2012    Family History  Problem Relation Age of Onset  . Alcohol abuse Father   . Hyperlipidemia Father   . Heart disease Father   . Alcohol abuse Brother   . Diabetes Brother   . Hypertension Brother   . Arthritis Mother   . Uterine cancer Mother   . Hypertension Mother   . Heart disease Sister        cardiac stent  . Diabetes Sister   . Hyperlipidemia Sister   . Hypertension Sister   . Cancer Brother        lung  . Cancer Maternal Aunt        lung----non smoker  . Uterine cancer Sister    Past Surgical History:  Procedure Laterality Date  . CHOLECYSTECTOMY  1999  . COLONOSCOPY  10/31/11  . EXPLORATORY LAPAROTOMY    . TONSILLECTOMY  1969  . WISDOM TOOTH EXTRACTION     Social History   Social History Narrative   Exercise-- jogs     Objective: Vital Signs: BP 124/78   Pulse 78   Resp 16   LMP 10/03/2005    Physical Exam  Constitutional: She is oriented to person, place, and time. She appears well-developed and well-nourished.  HENT:  Head: Normocephalic and atraumatic.  Eyes: Conjunctivae and EOM are normal.  Neck: Normal range of motion.  Cardiovascular: Normal rate, regular rhythm, normal heart sounds and intact distal pulses.   Pulmonary/Chest: Effort normal and breath sounds normal.  Abdominal: Soft. Bowel sounds are normal.  Lymphadenopathy:    She has no cervical adenopathy.  Neurological: She is alert and oriented to person, place, and time.  Skin: Skin is warm and dry. Capillary refill takes less than 2 seconds.  Psychiatric: She has a normal mood and affect. Her behavior is normal.  Nursing note and vitals reviewed.     Musculoskeletal Exam: C-spine and thoracic lumbar spine good range of motion. She has some discomfort with forward flexion of her C-spine. Shoulder joints elbow joints were good range of motion. She has tenderness on palpation of her right wrist joint with some swelling. Her MCPs and PIPs joint discomfort as described below. Hip joints knee joints ankles MTPs PIPs with good range of motion with no synovitis.  CDAI Exam: CDAI Homunculus Exam:   Tenderness:  RUE: wrist Right hand: 2nd MCP, 3rd MCP, 4th MCP, 2nd PIP and 3rd PIP Left hand: 2nd MCP, 3rd MCP, 4th MCP, 2nd PIP and 3rd PIP  Swelling:  RUE: wrist  Joint Counts:  CDAI Tender Joint count: 11 CDAI Swollen Joint count: 1  Global Assessments:  Patient Global Assessment: 5 Provider Global Assessment: 5  CDAI Calculated Score: 22    Investigation: Findings:  06/2016 negative TB gold    CBC Latest Ref Rng & Units  12/27/2016 07/26/2016 02/04/2016  WBC 3.8 - 10.8 K/uL 7.4 6.0 5.6  Hemoglobin 11.7 - 15.5 g/dL 13.5 12.7 13.7  Hematocrit 35.0 - 45.0 % 41.0 38.7 41  Platelets 140 - 400 K/uL 277 257 260     CMP Latest Ref Rng & Units 12/27/2016 07/26/2016 02/04/2016  Glucose 65 - 99 mg/dL 75 101(H) -  BUN 7 - 25 mg/dL 22 14 20   Creatinine 0.50 - 1.05 mg/dL 1.06(H) 0.84 0.8  Sodium 135 - 146 mmol/L 141 139 138  Potassium 3.5 - 5.3 mmol/L 4.4 4.7 4.2  Chloride 98 - 110 mmol/L 108 106 -  CO2 20 - 31 mmol/L 25 24 -  Calcium 8.6 - 10.4 mg/dL 9.4 8.9 -  Total Protein 6.1 - 8.1 g/dL 6.8 6.3 -  Total Bilirubin 0.2 - 1.2 mg/dL 0.3 0.5 -  Alkaline Phos 33 - 130 U/L 62 54 59  AST 10 - 35 U/L 12 12 12(A)  ALT 6 - 29 U/L 21 18 17      Imaging: No results found.  Speciality Comments: No specialty comments available.    Procedures:  No procedures performed Allergies: Other and Contrast media [iodinated diagnostic agents]   Assessment / Plan:     Visit Diagnoses: Rheumatoid arthritis involving multiple sites with positive  rheumatoid factor ([place]): Patient is having a flare of Simponi for 2 months now. She was given prednisone 40 mg for 5 days at the urgent care. Which is relieved some of her symptoms. She still continues to have more of arthralgias and synovitis is marked above. She will resume her Simponi injection today at home. She's been having frequent flares and wants FMLA for flares. We will give her FMLA for 2 days a month.  High risk medication use  - Simponi subcutaneous every month has been on hold for the last 2 months. - Plan: CBC with Differential/Platelet, COMPLETE METABOLIC PANEL WITH GFR today and then every 3 months to monitor for drug toxicity.  Primary osteoarthritis of both hands: She has some osteoarthritic changes in her hands.  Pain, neck: She complains of neck pain with left-sided radiculopathy and numbness in her left fourth and fifth fingers. I offered x-rays and MRI to evaluate this further. But patient declined. If her symptoms get worse she supposed to notify us.  Other fatigue: Complains of increased fatigue  Pulmonary nodule  History of anxiety: She continues to have anxiety.  History of shingles  History of hypertension: Blood pressure is well controlled.  History of bundle branch block    Orders: Orders Placed This Encounter  Procedures  . CBC with Differential/Platelet  . COMPLETE METABOLIC PANEL WITH GFR   No orders of the defined types were placed in this encounter.   Face-to-face time spent with patient was 30 minutes. 50% of time was spent in counseling and coordination of care.  Follow-Up Instructions: Return in about 4 months (around 07/27/2017) for Rheumatoid arthritis.   Bo Merino, MD  Note - This record has been created using Editor, commissioning.  Chart creation errors have been sought, but may not always  have been located. Such creation errors do not reflect on  the standard of medical care.

## 2017-03-28 LAB — COMPLETE METABOLIC PANEL WITH GFR
ALBUMIN: 4.3 g/dL (ref 3.6–5.1)
ALK PHOS: 58 U/L (ref 33–130)
ALT: 17 U/L (ref 6–29)
AST: 9 U/L — ABNORMAL LOW (ref 10–35)
BUN: 15 mg/dL (ref 7–25)
CO2: 25 mmol/L (ref 20–31)
CREATININE: 0.78 mg/dL (ref 0.50–1.05)
Calcium: 9.6 mg/dL (ref 8.6–10.4)
Chloride: 102 mmol/L (ref 98–110)
GFR, Est African American: >89 mL/min (ref >=60)
GFR, Est Non African American: 86 mL/min (ref >=60)
GLUCOSE: 81 mg/dL (ref 65–99)
Potassium: 4.4 mmol/L (ref 3.5–5.3)
SODIUM: 140 mmol/L (ref 135–146)
TOTAL PROTEIN: 6.8 g/dL (ref 6.1–8.1)
Total Bilirubin: 0.7 mg/dL (ref 0.2–1.2)

## 2017-03-28 NOTE — Progress Notes (Signed)
Within normal limits

## 2017-04-03 ENCOUNTER — Encounter: Payer: Self-pay | Admitting: Family Medicine

## 2017-04-03 ENCOUNTER — Ambulatory Visit (INDEPENDENT_AMBULATORY_CARE_PROVIDER_SITE_OTHER): Payer: 59 | Admitting: Family Medicine

## 2017-04-03 VITALS — BP 120/82 | HR 60 | Temp 98.2°F | Ht 67.0 in | Wt 190.4 lb

## 2017-04-03 DIAGNOSIS — M792 Neuralgia and neuritis, unspecified: Secondary | ICD-10-CM | POA: Diagnosis not present

## 2017-04-03 MED ORDER — GABAPENTIN 400 MG PO CAPS: 400.0000 mg | ORAL_CAPSULE | Freq: Three times a day (TID) | ORAL | 0 refills | Status: DC

## 2017-04-03 MED FILL — GABAPENTIN 400 MG CAPSULE: 400 | 30 days supply | Qty: 90 | Fill #0

## 2017-04-03 NOTE — Patient Instructions (Addendum)
Take medicine around 9 PM. If it makes you drowsy, try to avoid during the day.  Melatonin comes in 1-3 mg tabs. Try taking 3 mg 30-60 minutes before planned bedtime. These come in gummy forms.   Let us know if you need anything.

## 2017-04-03 NOTE — Progress Notes (Signed)
Musculoskeletal Exam  Patient: Sharon Hunter DOB: 12/03/1960  DOS: 04/03/2017  SUBJECTIVE:  Chief Complaint:   Chief Complaint  Patient presents with  . Shoulder Pain    pinched nerve/not sleeping/worse when trying to sleep    Sharon Hunter is a 56 y.o.  female for evaluation and treatment of L shoulder/upper back pain.   This initially started 4 weeks ago. She had fallen and tripped over some boxes. She is prone to getting knots in her upper shoulder/trapezius area. This issue started 4 weeks ago. 2 weeks after things started, she went to a massage therapist who did high pressure and deep tissue stimulation. She went in for one appointment having this done and had some soreness. She returned 3 days later and had another treatment. This made things significantly worse and she started having decreased sensation in her left upper extremity as well as some weakness. This sensation issue is slowly improving and the weakness has resolved. She is having burning pain in her upper back area on the left. She describes it as not radiating. She has tried anti-inflammatories, Tylenol, hydrocodone, tramadol. The tramadol as it only thing that was moderately helpful. Currently, she is having difficulty sleeping as laying down makes it worse. Sitting makes it better. A coworker gave her trazodone helped for a night, however last night she was up at 3 morning.  ROS: Musculoskeletal/Extremities: +upper shoulder pain Neurologic: no weakness, +numbness over left arm  Past Medical History:  Diagnosis Date  . Anxiety   . Breast lump    stable-- gets annual mammogram  . 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 Hypertension   . IBS (irritable bowel syndrome) 08/01/2016  . Migraine   . Murmur 02/03/2011  . Ovarian failure   . Pulmonary nodule, left 2009   last CT Nov 2011-- due 08/2011  . Rheumatoid arthritis(714.0)    in remission , off meds as of Summer 2012   Past Surgical History:  Procedure  Laterality Date  . CHOLECYSTECTOMY  1999  . COLONOSCOPY  10/31/11  . EXPLORATORY LAPAROTOMY    . TONSILLECTOMY  1969  . WISDOM TOOTH EXTRACTION     Family History  Problem Relation Age of Onset  . Alcohol abuse Father   . Hyperlipidemia Father   . Heart disease Father   . Alcohol abuse Brother   . Diabetes Brother   . Hypertension Brother   . Arthritis Mother   . Uterine cancer Mother   . Hypertension Mother   . Heart disease Sister        cardiac stent  . Diabetes Sister   . Hyperlipidemia Sister   . Hypertension Sister   . Cancer Brother        lung  . Cancer Maternal Aunt        lung----non smoker  . Uterine cancer Sister    Current Outpatient Prescriptions  Medication Sig Dispense Refill  . diclofenac sodium (VOLTAREN) 1 % GEL APPLY 3 GRAMS TO LARGE JOINTS THREE TIMES DAILY AS NEEDED FOR PAIN 300 g 3  . Golimumab 50 MG/0.5ML SOAJ Inject 50 mg into the skin every 30 (thirty) days. 3 Syringe 0  . ibuprofen (ADVIL,MOTRIN) 400 MG tablet Take 400 mg by mouth every 6 (six) hours as needed.     Allergies  Allergen Reactions  . Other Swelling    Contrast dye- head swelling per pt.  . Contrast Media [Iodinated Diagnostic Agents]    Social History   Social History  .  Marital status: Single  . Number of children: 1  . Years of education: 29   Occupational History  . Not on file.   Social History Main Topics  . Smoking status: Former Smoker    Quit date: 10/03/1990  . Smokeless tobacco: Never Used  . Alcohol use 1.8 oz/week    3 Standard drinks or equivalent per week     Comment: Red wine 4 oz 4-5 times a week  . Drug use: No  . Sexual activity: Not Currently    Partners: Male   Social History Narrative   Exercise-- jogs    Objective: VITAL SIGNS: BP 120/82 (BP Location: Left Arm, Patient Position: Sitting, Cuff Size: Normal)   Pulse 60   Temp 98.2 F (36.8 C) (Oral)   Ht 5\' 7"  (1.702 m)   Wt 190 lb 6 oz (86.4 kg)   LMP 10/03/2005   SpO2 97%   BMI 29.82  kg/m  Constitutional: Well formed, well developed. No acute distress. Cardiovascular: Brisk cap refill Thorax & Lungs: No accessory muscle use Extremities: No clubbing. No cyanosis. No edema.  Skin: Warm. Dry. No erythema. No rash.  Musculoskeletal: The area of interest located on her L rhomboid region at approx T4 is not TTP; no edema. Her shoulder joint is reg with no decreased ROM and neg Neer's, Spurling's, Hawkins, Lift off, cross over, Obriens, Speeds, Empty can; 5/5 strength throughout and equal b/l in UE's Neurologic: Normal sensory function on hand; decreased sensation to light touch over medial forearm over C8 distribution (which was spared on the hand) and the medial arm to a lesser extent. No focal deficits noted. Neg Tinel's over L  DTR's equal and symmetry in UE's. No clonus. Psychiatric: Normal mood. Age appropriate judgment and insight. Alert & oriented x 3.    Assessment:  Neuropathic pain - Plan: gabapentin (NEURONTIN) 400 MG capsule  Plan: Orders as above. Gabapentin to help with neuropathic pain and maybe use side effect of drowsiness to help her sleep. Recommended taking at 9 PM as she wakes up at 5 AM on work days. She's not showing any signs or symptoms concerning for a compressed nerve. The only abnormality exam includes decreased sensation to light touch. This has been improving. She may have had an injury or nerve sheath damage from the deep tissue massage that she had. If she starts to plateau, develop new symptoms, or worsen, she will call our office and we will refer her to neurology. Follow-up as needed with her regular PCP. The patient voiced understanding and agreement to the plan.  Greater than 25 minutes were spent face to face with the patient with greater than 50% of this time spent counseling on nerve sheath injury, low concern for permanent or irreversible damage given her improvement, prognosis, follow up care, OTC melatonin usage, and gabapentin use/side  effects.    [place], DO 04/03/17  12:28 PM

## 2017-04-03 NOTE — Progress Notes (Signed)
Pre visit review using our clinic review tool, if applicable. No additional management support is needed unless otherwise documented below in the visit note. 

## 2017-04-07 ENCOUNTER — Telehealth: Payer: Self-pay | Admitting: Rheumatology

## 2017-04-07 NOTE — Telephone Encounter (Signed)
Patient calling in reference to FMLA papers that need to be filled out. Patient needs papers filled out for the future, not past. Paperwork has been sent into [employer] already. Please call patient if questions.

## 2017-04-07 NOTE — Telephone Encounter (Signed)
Please reference the office note on 03/27/17, assessment and plan is where FMLA is discussed for future use.

## 2017-04-10 DIAGNOSIS — M50122 Cervical disc disorder at C5-C6 level with radiculopathy: Secondary | ICD-10-CM | POA: Diagnosis not present

## 2017-04-10 DIAGNOSIS — M9902 Segmental and somatic dysfunction of thoracic region: Secondary | ICD-10-CM | POA: Diagnosis not present

## 2017-04-10 DIAGNOSIS — M5134 Other intervertebral disc degeneration, thoracic region: Secondary | ICD-10-CM | POA: Diagnosis not present

## 2017-04-10 DIAGNOSIS — M50322 Other cervical disc degeneration at C5-C6 level: Secondary | ICD-10-CM | POA: Diagnosis not present

## 2017-04-10 DIAGNOSIS — M9901 Segmental and somatic dysfunction of cervical region: Secondary | ICD-10-CM | POA: Diagnosis not present

## 2017-04-11 DIAGNOSIS — M9902 Segmental and somatic dysfunction of thoracic region: Secondary | ICD-10-CM | POA: Diagnosis not present

## 2017-04-11 DIAGNOSIS — M50322 Other cervical disc degeneration at C5-C6 level: Secondary | ICD-10-CM | POA: Diagnosis not present

## 2017-04-11 DIAGNOSIS — M9901 Segmental and somatic dysfunction of cervical region: Secondary | ICD-10-CM | POA: Diagnosis not present

## 2017-04-11 DIAGNOSIS — M5134 Other intervertebral disc degeneration, thoracic region: Secondary | ICD-10-CM | POA: Diagnosis not present

## 2017-04-11 DIAGNOSIS — M50122 Cervical disc disorder at C5-C6 level with radiculopathy: Secondary | ICD-10-CM | POA: Diagnosis not present

## 2017-04-17 DIAGNOSIS — M5134 Other intervertebral disc degeneration, thoracic region: Secondary | ICD-10-CM | POA: Diagnosis not present

## 2017-04-17 DIAGNOSIS — M50122 Cervical disc disorder at C5-C6 level with radiculopathy: Secondary | ICD-10-CM | POA: Diagnosis not present

## 2017-04-17 DIAGNOSIS — M50322 Other cervical disc degeneration at C5-C6 level: Secondary | ICD-10-CM | POA: Diagnosis not present

## 2017-04-17 DIAGNOSIS — M9901 Segmental and somatic dysfunction of cervical region: Secondary | ICD-10-CM | POA: Diagnosis not present

## 2017-04-17 DIAGNOSIS — M9902 Segmental and somatic dysfunction of thoracic region: Secondary | ICD-10-CM | POA: Diagnosis not present

## 2017-04-18 DIAGNOSIS — M50322 Other cervical disc degeneration at C5-C6 level: Secondary | ICD-10-CM | POA: Diagnosis not present

## 2017-04-18 DIAGNOSIS — M50122 Cervical disc disorder at C5-C6 level with radiculopathy: Secondary | ICD-10-CM | POA: Diagnosis not present

## 2017-04-18 DIAGNOSIS — M5134 Other intervertebral disc degeneration, thoracic region: Secondary | ICD-10-CM | POA: Diagnosis not present

## 2017-04-18 DIAGNOSIS — M9902 Segmental and somatic dysfunction of thoracic region: Secondary | ICD-10-CM | POA: Diagnosis not present

## 2017-04-18 DIAGNOSIS — M9901 Segmental and somatic dysfunction of cervical region: Secondary | ICD-10-CM | POA: Diagnosis not present

## 2017-04-21 ENCOUNTER — Other Ambulatory Visit: Payer: Self-pay | Admitting: *Deleted

## 2017-04-21 ENCOUNTER — Telehealth: Payer: Self-pay | Admitting: Pharmacist

## 2017-04-21 ENCOUNTER — Other Ambulatory Visit: Payer: Self-pay | Admitting: Pharmacist

## 2017-04-21 MED ORDER — GOLIMUMAB 50 MG/0.5ML ~~LOC~~ SOAJ: 50.0000 mg | SUBCUTANEOUS | 0 refills | Status: DC

## 2017-04-21 NOTE — Telephone Encounter (Signed)
Last Visit: 03/27/17 Next Visit: 08/01/17 Labs: 03/27/17 TB Gold: 06/29/16 Neg  Okay to refill per Dr. Estanislado Pandy

## 2017-04-21 NOTE — Telephone Encounter (Signed)
Received notice of rejection for patient's Simponi from [facility].  Noted the rejection was for Simponi syringes.  I called the pharmacy and spoke to [facility].  Discussed that prescription is for Simponi autoinjector.  Prescription still rejected.  Most recent refill was 03/27/17.  Rejection may be because prescription is too soon.  The pharmacy will try to reprocess the prescription on Monday and will contact us if there is any concern.  Elisabeth Most, Pharm.D., BCPS, CPP Clinical Pharmacist Pager: [phone] Phone: [phone] 04/21/2017 3:36 PM

## 2017-04-24 DIAGNOSIS — M50122 Cervical disc disorder at C5-C6 level with radiculopathy: Secondary | ICD-10-CM | POA: Diagnosis not present

## 2017-04-24 DIAGNOSIS — M50322 Other cervical disc degeneration at C5-C6 level: Secondary | ICD-10-CM | POA: Diagnosis not present

## 2017-04-24 DIAGNOSIS — M9901 Segmental and somatic dysfunction of cervical region: Secondary | ICD-10-CM | POA: Diagnosis not present

## 2017-04-24 DIAGNOSIS — M5134 Other intervertebral disc degeneration, thoracic region: Secondary | ICD-10-CM | POA: Diagnosis not present

## 2017-04-24 DIAGNOSIS — M9902 Segmental and somatic dysfunction of thoracic region: Secondary | ICD-10-CM | POA: Diagnosis not present

## 2017-04-24 MED FILL — SIMPONI 50 MG/0.5ML SOAJ: 50 | 28 days supply | Qty: 1 | Fill #0

## 2017-04-25 DIAGNOSIS — M50122 Cervical disc disorder at C5-C6 level with radiculopathy: Secondary | ICD-10-CM | POA: Diagnosis not present

## 2017-04-25 DIAGNOSIS — M9902 Segmental and somatic dysfunction of thoracic region: Secondary | ICD-10-CM | POA: Diagnosis not present

## 2017-04-25 DIAGNOSIS — M9901 Segmental and somatic dysfunction of cervical region: Secondary | ICD-10-CM | POA: Diagnosis not present

## 2017-04-25 DIAGNOSIS — M5134 Other intervertebral disc degeneration, thoracic region: Secondary | ICD-10-CM | POA: Diagnosis not present

## 2017-04-25 DIAGNOSIS — M50322 Other cervical disc degeneration at C5-C6 level: Secondary | ICD-10-CM | POA: Diagnosis not present

## 2017-05-02 DIAGNOSIS — M9902 Segmental and somatic dysfunction of thoracic region: Secondary | ICD-10-CM | POA: Diagnosis not present

## 2017-05-02 DIAGNOSIS — M50322 Other cervical disc degeneration at C5-C6 level: Secondary | ICD-10-CM | POA: Diagnosis not present

## 2017-05-02 DIAGNOSIS — M9901 Segmental and somatic dysfunction of cervical region: Secondary | ICD-10-CM | POA: Diagnosis not present

## 2017-05-02 DIAGNOSIS — M50122 Cervical disc disorder at C5-C6 level with radiculopathy: Secondary | ICD-10-CM | POA: Diagnosis not present

## 2017-05-02 DIAGNOSIS — M5134 Other intervertebral disc degeneration, thoracic region: Secondary | ICD-10-CM | POA: Diagnosis not present

## 2017-05-15 DIAGNOSIS — M5134 Other intervertebral disc degeneration, thoracic region: Secondary | ICD-10-CM | POA: Diagnosis not present

## 2017-05-15 DIAGNOSIS — M9901 Segmental and somatic dysfunction of cervical region: Secondary | ICD-10-CM | POA: Diagnosis not present

## 2017-05-15 DIAGNOSIS — M50122 Cervical disc disorder at C5-C6 level with radiculopathy: Secondary | ICD-10-CM | POA: Diagnosis not present

## 2017-05-15 DIAGNOSIS — M50322 Other cervical disc degeneration at C5-C6 level: Secondary | ICD-10-CM | POA: Diagnosis not present

## 2017-05-15 DIAGNOSIS — M9902 Segmental and somatic dysfunction of thoracic region: Secondary | ICD-10-CM | POA: Diagnosis not present

## 2017-05-19 MED FILL — SIMPONI 50 MG/0.5ML SOAJ: 50 | 28 days supply | Qty: 1 | Fill #1

## 2017-05-23 DIAGNOSIS — M50122 Cervical disc disorder at C5-C6 level with radiculopathy: Secondary | ICD-10-CM | POA: Diagnosis not present

## 2017-05-23 DIAGNOSIS — M9901 Segmental and somatic dysfunction of cervical region: Secondary | ICD-10-CM | POA: Diagnosis not present

## 2017-05-23 DIAGNOSIS — M9902 Segmental and somatic dysfunction of thoracic region: Secondary | ICD-10-CM | POA: Diagnosis not present

## 2017-05-23 DIAGNOSIS — M5134 Other intervertebral disc degeneration, thoracic region: Secondary | ICD-10-CM | POA: Diagnosis not present

## 2017-05-23 DIAGNOSIS — M50322 Other cervical disc degeneration at C5-C6 level: Secondary | ICD-10-CM | POA: Diagnosis not present

## 2017-05-30 ENCOUNTER — Ambulatory Visit: Payer: 59 | Admitting: Rheumatology

## 2017-07-19 NOTE — Progress Notes (Deleted)
Office Visit Note  Patient: Sharon Hunter             Date of Birth: [birth year]           MRN: [id number]             PCP: [name], DO Referring: [name], * Visit Date: 08/01/2017 Occupation: @GUAROCC @    Subjective:  No chief complaint on file.   History of Present Illness: Sharon Hunter is a 56 y.o. female ***   Activities of Daily Living:  Patient reports morning stiffness for *** {minute/hour:19697}.   Patient {ACTIONS;DENIES/REPORTS:21021675::"Denies"} nocturnal pain.  Difficulty dressing/grooming: {ACTIONS;DENIES/REPORTS:21021675::"Denies"} Difficulty climbing stairs: {ACTIONS;DENIES/REPORTS:21021675::"Denies"} Difficulty getting out of chair: {ACTIONS;DENIES/REPORTS:21021675::"Denies"} Difficulty using hands for taps, buttons, cutlery, and/or writing: {ACTIONS;DENIES/REPORTS:21021675::"Denies"}   No Rheumatology ROS completed.   PMFS History:  Patient Active Problem List   Diagnosis Date Noted  . History of anxiety 03/27/2017  . History of hypertension 03/27/2017  . History of bundle branch block 03/27/2017  . Primary osteoarthritis of both hands 12/22/2016  . History of shingles 08/02/2016  . High risk medication use 08/02/2016  . Hypertension 08/01/2016  . IBS (irritable bowel syndrome) 08/01/2016  . Anxiety 08/01/2016  . 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 Rate-related bundle branch block 01/30/2015  . Palpitations 01/30/2015  . Pulmonary nodule 07/04/2011  . Murmur 02/03/2011  . Rheumatoid arthritis ([place]) 02/03/2011  . Hair loss 02/03/2011  . Fatigue 02/03/2011    Past Medical History:  Diagnosis Date  . Anxiety   . Breast lump    stable-- gets annual mammogram  . 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 Hypertension   . IBS (irritable bowel syndrome) 08/01/2016  . Migraine   . Murmur 02/03/2011  . Ovarian failure   . Pulmonary nodule, left 2009   last CT Nov 2011-- due 08/2011  . Rheumatoid  arthritis(714.0)    in remission , off meds as of Summer 2012    Family History  Problem Relation Age of Onset  . Alcohol abuse Father   . Hyperlipidemia Father   . Heart disease Father   . Alcohol abuse Brother   . Diabetes Brother   . Hypertension Brother   . Arthritis Mother   . Uterine cancer Mother   . Hypertension Mother   . Heart disease Sister        cardiac stent  . Diabetes Sister   . Hyperlipidemia Sister   . Hypertension Sister   . Cancer Brother        lung  . Cancer Maternal Aunt        lung----non smoker  . Uterine cancer Sister    Past Surgical History:  Procedure Laterality Date  . CHOLECYSTECTOMY  1999  . COLONOSCOPY  10/31/11  . EXPLORATORY LAPAROTOMY    . TONSILLECTOMY  1969  . WISDOM TOOTH EXTRACTION     Social History   Social History Narrative   Exercise-- jogs     Objective: Vital Signs: LMP 10/03/2005    Physical Exam   Musculoskeletal Exam: ***  CDAI Exam: No CDAI exam completed.    Investigation: No additional findings. CBC Latest Ref Rng & Units 03/27/2017 12/27/2016 07/26/2016  WBC 3.8 - 10.8 K/uL 10.7 7.4 6.0  Hemoglobin 11.7 - 15.5 g/dL 13.9 13.5 12.7  Hematocrit 35.0 - 45.0 % 42.1 41.0 38.7  Platelets 140 - 400 K/uL 307 277 257   CMP Latest Ref Rng & Units 03/27/2017 12/27/2016  07/26/2016  Glucose 65 - 99 mg/dL 81 75 101(H)  BUN 7 - 25 mg/dL 15 22 14   Creatinine 0.50 - 1.05 mg/dL 0.78 1.06(H) 0.84  Sodium 135 - 146 mmol/L 140 141 139  Potassium 3.5 - 5.3 mmol/L 4.4 4.4 4.7  Chloride 98 - 110 mmol/L 102 108 106  CO2 20 - 31 mmol/L 25 25 24   Calcium 8.6 - 10.4 mg/dL 9.6 9.4 8.9  Total Protein 6.1 - 8.1 g/dL 6.8 6.8 6.3  Total Bilirubin 0.2 - 1.2 mg/dL 0.7 0.3 0.5  Alkaline Phos 33 - 130 U/L 58 62 54  AST 10 - 35 U/L 9(L) 12 12  ALT 6 - 29 U/L 17 21 18     Imaging: No results found.  Speciality Comments: No specialty comments available.    Procedures:  No procedures performed Allergies: Other and Contrast media  [iodinated diagnostic agents]   Assessment / Plan:     Visit Diagnoses: No diagnosis found.    Orders: No orders of the defined types were placed in this encounter.  No orders of the defined types were placed in this encounter.   Face-to-face time spent with patient was *** minutes. 50% of time was spent in counseling and coordination of care.  Follow-Up Instructions: No Follow-up on file.   Earnestine Mealing, NT  Note - This record has been created using Editor, commissioning.  Chart creation errors have been sought, but may not always  have been located. Such creation errors do not reflect on  the standard of medical care.

## 2017-08-01 ENCOUNTER — Ambulatory Visit: Payer: 59 | Admitting: Rheumatology

## 2017-08-15 ENCOUNTER — Telehealth (INDEPENDENT_AMBULATORY_CARE_PROVIDER_SITE_OTHER): Payer: Self-pay

## 2017-08-15 ENCOUNTER — Telehealth: Payer: Self-pay

## 2017-08-15 ENCOUNTER — Other Ambulatory Visit: Payer: Self-pay | Admitting: Radiology

## 2017-08-15 DIAGNOSIS — Z1231 Encounter for screening mammogram for malignant neoplasm of breast: Secondary | ICD-10-CM

## 2017-08-15 MED FILL — AMOXICILLIN 250 MG/5 ML SUS: 250 | 10 days supply | Qty: 300 | Fill #0

## 2017-08-15 NOTE — Telephone Encounter (Signed)
Attempted to contact the patient and left message for patient to call the office.  

## 2017-08-15 NOTE — Telephone Encounter (Signed)
Patient was calling to R/S appointment, but Dr. Estanislado Pandy has no appts. Available.  Patient stated that she is on an injection.  Coulde you please advise.  Thank You.

## 2017-08-15 NOTE — Telephone Encounter (Signed)
Patient was returning Andrea's call.  Cb# is [phone].  Please advise.  Thank You.

## 2017-08-18 NOTE — Telephone Encounter (Signed)
See previous phone note.  

## 2017-08-18 NOTE — Telephone Encounter (Signed)
Attempted to contact the patient and left message for patient to call the office.  

## 2017-08-21 ENCOUNTER — Telehealth (INDEPENDENT_AMBULATORY_CARE_PROVIDER_SITE_OTHER): Payer: Self-pay

## 2017-08-21 NOTE — Telephone Encounter (Signed)
See previous phone note.  

## 2017-08-21 NOTE — Telephone Encounter (Signed)
Called to see if nurse had received message about a RX.

## 2017-08-21 NOTE — Telephone Encounter (Signed)
Patient states she is having a flare. Patient has been off of her Simponi injection due to being ATB. Patient is having trouble with a tooth healing. Patient having trouble with bilateral shoulder pain with the left being the worse as well as bilateral wrist pain with the right wrist being warm and swollen. Patient states she has taken motrin used Voltaren gel and taken a hot shower and received some relief. Patient would like a prednisone taper.

## 2017-08-21 NOTE — Telephone Encounter (Signed)
Patient was retuning Sharon Hunter's call, but stated that she is having a flare and could not go to work today.  Stated that she would like a steroid today.  Cb# is [phone].  Please advise.  Thank You.

## 2017-08-22 MED ORDER — PREDNISONE 5 MG PO TABS: ORAL_TABLET | ORAL | 0 refills | Status: DC

## 2017-08-22 MED FILL — predniSONE 5 MG TABS: 5 | 16 days supply | Qty: 40 | Fill #0

## 2017-08-22 NOTE — Telephone Encounter (Signed)
Per Dr. Estanislado Pandy okay to send in a prednisone taper starting with 20 mg x 4 days and tapering by 5 mg every 4 days. Prescription sent to the pharmacy and left message to advise the patient.

## 2017-08-22 NOTE — Addendum Note (Signed)
Addended by: Carole Binning on: 08/22/2017 08:17 AM   Modules accepted: Orders

## 2017-08-27 ENCOUNTER — Ambulatory Visit (HOSPITAL_COMMUNITY)
Admission: EM | Admit: 2017-08-27 | Discharge: 2017-08-27 | Disposition: A | Discharge status: Home / Self Care | Payer: 59 | Attending: Family Medicine | Admitting: Family Medicine

## 2017-08-27 ENCOUNTER — Encounter (HOSPITAL_COMMUNITY): Payer: Self-pay | Admitting: Emergency Medicine

## 2017-08-27 DIAGNOSIS — Z87891 Personal history of nicotine dependence: Secondary | ICD-10-CM | POA: Diagnosis not present

## 2017-08-27 DIAGNOSIS — K589 Irritable bowel syndrome without diarrhea: Secondary | ICD-10-CM | POA: Insufficient documentation

## 2017-08-27 DIAGNOSIS — M069 Rheumatoid arthritis, unspecified: Secondary | ICD-10-CM | POA: Diagnosis not present

## 2017-08-27 DIAGNOSIS — J029 Acute pharyngitis, unspecified: Secondary | ICD-10-CM | POA: Insufficient documentation

## 2017-08-27 DIAGNOSIS — F419 Anxiety disorder, unspecified: Secondary | ICD-10-CM | POA: Insufficient documentation

## 2017-08-27 DIAGNOSIS — I10 Essential (primary) hypertension: Secondary | ICD-10-CM | POA: Diagnosis not present

## 2017-08-27 LAB — POCT RAPID STREP A: STREPTOCOCCUS, GROUP A SCREEN (DIRECT): NEGATIVE

## 2017-08-27 MED ORDER — CEFDINIR 250 MG/5ML PO SUSR: 300.0000 mg | Freq: Two times a day (BID) | ORAL | 0 refills | Status: DC

## 2017-08-27 NOTE — Discharge Instructions (Signed)
Strep test is pending.  Initial rapid strep is negative.  Should have final results in 2 days.

## 2017-08-27 NOTE — ED Triage Notes (Signed)
Pt here for sore throat x 2 days; pt sts currently on prednisone for RA

## 2017-08-27 NOTE — ED Provider Notes (Signed)
[place]   [id number] 08/27/17 Arrival Time: 1204   SUBJECTIVE:  Sharon Hunter is a 56 y.o. female who presents to the urgent care with complaint of sore throat x 2 days; pt sts currently on prednisone for RA     Past Medical History:  Diagnosis Date  . Anxiety   . Breast lump    stable-- gets annual mammogram  . 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 Hypertension   . IBS (irritable bowel syndrome) 08/01/2016  . Migraine   . Murmur 02/03/2011  . Ovarian failure   . Pulmonary nodule, left 2009   last CT Nov 2011-- due 08/2011  . Rheumatoid arthritis(714.0)    in remission , off meds as of Summer 2012   Family History  Problem Relation Age of Onset  . Alcohol abuse Father   . Hyperlipidemia Father   . Heart disease Father   . Alcohol abuse Brother   . Diabetes Brother   . Hypertension Brother   . Arthritis Mother   . Uterine cancer Mother   . Hypertension Mother   . Heart disease Sister        cardiac stent  . Diabetes Sister   . Hyperlipidemia Sister   . Hypertension Sister   . Cancer Brother        lung  . Cancer Maternal Aunt        lung----non smoker  . Uterine cancer Sister    Social History   Socioeconomic History  . Marital status: Single    Spouse name: Not on file  . Number of children: 1  . Years of education: 41  . Highest education level: Not on file  Social Needs  . Financial resource strain: Not on file  . Food insecurity - worry: Not on file  . Food insecurity - inability: Not on file  . Transportation needs - medical: Not on file  . Transportation needs - non-medical: Not on file  Occupational History  . Not on file  Tobacco Use  . Smoking status: Former Smoker    Last attempt to quit: 10/03/1990    Years since quitting: 26.9  . Smokeless tobacco: Never Used  Substance and Sexual Activity  . Alcohol use: Yes    Alcohol/week: 1.8 oz    Types: 3 Standard drinks or equivalent per week    Comment: Red wine 4 oz  4-5 times a week  . Drug use: No  . Sexual activity: Not Currently    Partners: Male  Other Topics Concern  . Not on file  Social History Narrative   Exercise-- jogs   No outpatient medications have been marked as taking for the 08/27/17 encounter [facility] Encounter).   Allergies  Allergen Reactions  . Other Swelling    Contrast dye- head swelling per pt.  . Contrast Media [Iodinated Diagnostic Agents]       ROS: As per HPI, remainder of ROS negative.   OBJECTIVE:   Vitals:   08/27/17 1233  BP: (!) 161/89  Pulse: 65  Resp: 16  Temp: 97.9 F (36.6 C)  TempSrc: Oral  SpO2: 98%     General appearance: alert; no distress Eyes: PERRL; EOMI; conjunctiva normal HENT: normocephalic; atraumatic; TMs normal, canal normal, external ears normal without trauma; nasal mucosa normal; oral mucosa normal Neck: supple Lungs: clear to auscultation bilaterally Heart: regular rate and rhythm Abdomen: soft, non-tender; bowel sounds normal; no masses or organomegaly; no guarding or rebound tenderness Back: no CVA tenderness  Extremities: no cyanosis or edema; symmetrical with no gross deformities Skin: warm and dry Neurologic: normal gait; grossly normal Psychological: alert and cooperative; normal mood and affect      Labs:  Results for orders placed or performed during the hospital encounter of 08/27/17  POCT rapid strep A [facility])  Result Value Ref Range   Streptococcus, Group A Screen (Direct) NEGATIVE NEGATIVE    Labs Reviewed  CULTURE, GROUP A STREP [facility])  POCT RAPID STREP A    No results found.     ASSESSMENT & PLAN:  1. Acute pharyngitis, unspecified etiology     Meds ordered this encounter  Medications  . cefdinir (OMNICEF) 250 MG/5ML suspension    Sig: Take 6 mLs (300 mg total) by mouth 2 (two) times daily.    Dispense:  120 mL    Refill:  0    Reviewed expectations re: course of current medical issues. Questions answered. Outlined  signs and symptoms indicating need for more acute intervention. Patient verbalized understanding. After Visit Summary given.    Procedures:      Robyn Haber, MD 08/27/17 1243

## 2017-08-28 DIAGNOSIS — Z6830 Body mass index (BMI) 30.0-30.9, adult: Secondary | ICD-10-CM | POA: Diagnosis not present

## 2017-08-28 DIAGNOSIS — Z01419 Encounter for gynecological examination (general) (routine) without abnormal findings: Secondary | ICD-10-CM | POA: Diagnosis not present

## 2017-08-28 MED FILL — ESTRADIOL 0.1 MG/GM CREA: 0.1 | 90 days supply | Qty: 43 | Fill #0

## 2017-08-30 LAB — CULTURE, GROUP A STREP (THRC)

## 2017-09-06 MED FILL — SIMPONI 50 MG/0.5ML SOAJ: 50 | 28 days supply | Qty: 1 | Fill #2

## 2017-09-12 ENCOUNTER — Ambulatory Visit: Payer: 59

## 2017-09-22 ENCOUNTER — Ambulatory Visit: Payer: 59

## 2017-10-11 ENCOUNTER — Telehealth: Payer: Self-pay | Admitting: Rheumatology

## 2017-10-11 NOTE — Telephone Encounter (Signed)
Patient left a voicemail requesting a prescription refill of Simponi.  CB [phone]

## 2017-10-12 ENCOUNTER — Other Ambulatory Visit: Payer: Self-pay | Admitting: *Deleted

## 2017-10-12 DIAGNOSIS — Z9225 Personal history of immunosupression therapy: Secondary | ICD-10-CM

## 2017-10-12 NOTE — Telephone Encounter (Signed)
Last Visit: 03/27/17 Next visit: 01/30/18 Labs: 03/27/2017 TB Gold:  06/2016 Neg   Left message for patient to advise she is due for updated labs. Unable to refill prescription until labs are updated.

## 2017-10-16 ENCOUNTER — Other Ambulatory Visit: Payer: Self-pay

## 2017-10-16 ENCOUNTER — Other Ambulatory Visit: Payer: Self-pay | Admitting: *Deleted

## 2017-10-16 DIAGNOSIS — Z79899 Other long term (current) drug therapy: Secondary | ICD-10-CM

## 2017-10-16 DIAGNOSIS — Z9225 Personal history of immunosupression therapy: Secondary | ICD-10-CM | POA: Diagnosis not present

## 2017-10-16 LAB — CBC WITH DIFFERENTIAL/PLATELET
Basophils Absolute: 41 cells/uL (ref 0–200)
Basophils Relative: 0.7 %
EOS PCT: 1.5 %
Eosinophils Absolute: 87 cells/uL (ref 15–500)
HCT: 38.8 % (ref 35.0–45.0)
Hemoglobin: 13.3 g/dL (ref 11.7–15.5)
Lymphs Abs: 2372 cells/uL (ref 850–3900)
MCH: 31 pg (ref 27.0–33.0)
MCHC: 34.3 g/dL (ref 32.0–36.0)
MCV: 90.4 fL (ref 80.0–100.0)
MONOS PCT: 8.4 %
MPV: 10.9 fL (ref 7.5–12.5)
NEUTROS PCT: 48.5 %
Neutro Abs: 2813 cells/uL (ref 1500–7800)
Platelets: 259 10*3/uL (ref 140–400)
RBC: 4.29 10*6/uL (ref 3.80–5.10)
RDW: 12.4 % (ref 11.0–15.0)
TOTAL LYMPHOCYTE: 40.9 %
WBC: 5.8 10*3/uL (ref 3.8–10.8)
WBCMIX: 487 {cells}/uL (ref 200–950)

## 2017-10-16 LAB — COMPLETE METABOLIC PANEL WITH GFR
AG RATIO: 1.7 (calc) (ref 1.0–2.5)
ALBUMIN MSPROF: 4.4 g/dL (ref 3.6–5.1)
ALKALINE PHOSPHATASE (APISO): 67 U/L (ref 33–130)
ALT: 20 U/L (ref 6–29)
AST: 11 U/L (ref 10–35)
BUN: 19 mg/dL (ref 7–25)
CHLORIDE: 105 mmol/L (ref 98–110)
CO2: 28 mmol/L (ref 20–32)
Calcium: 9.6 mg/dL (ref 8.6–10.4)
Creat: 0.75 mg/dL (ref 0.50–1.05)
GFR, Est African American: 103 mL/min/{1.73_m2} (ref >=60)
GFR, Est Non African American: 89 mL/min/{1.73_m2} (ref >=60)
GLOBULIN: 2.6 g/dL (ref 1.9–3.7)
Glucose, Bld: 102 mg/dL — ABNORMAL HIGH (ref 65–99)
Potassium: 4.7 mmol/L (ref 3.5–5.3)
SODIUM: 139 mmol/L (ref 135–146)
Total Bilirubin: 0.3 mg/dL (ref 0.2–1.2)
Total Protein: 7 g/dL (ref 6.1–8.1)

## 2017-10-17 ENCOUNTER — Telehealth: Payer: Self-pay

## 2017-10-17 ENCOUNTER — Telehealth: Payer: Self-pay | Admitting: Rheumatology

## 2017-10-17 MED ORDER — GOLIMUMAB 50 MG/0.5ML ~~LOC~~ SOAJ: 50.0000 mg | SUBCUTANEOUS | 0 refills | Status: DC

## 2017-10-17 NOTE — Telephone Encounter (Signed)
Last visit: 03/27/17 Next Visit: 01/30/18 Labs: 10/16/17 WNl TB Gold: 06/2016 WNL Update 10/16/17 awaiting results  Okay to refill per Dr. Estanislado Pandy

## 2017-10-17 NOTE — Telephone Encounter (Signed)
Patient left a voicemail requesting her prescription for Simponi Aria be called into [facility] ASAP because they take 24 hours to order it after they receive the request.  She completed her labs yesterday.  Patient's CB# [phone]

## 2017-10-17 NOTE — Progress Notes (Signed)
wnl

## 2017-10-17 NOTE — Telephone Encounter (Signed)
Received a fax from [employer] for [employer]. Authorization was submitted to pts insurance via cover my meds. Will update once we receive a response.  Yekaterina Escutia, [place], CPhT 2:43 PM

## 2017-10-18 LAB — QUANTIFERON-TB GOLD PLUS
NIL: 0.04 [IU]/mL
QUANTIFERON-TB GOLD PLUS: NEGATIVE
TB1-NIL: 0.01 IU/mL
TB2-NIL: 0.01 IU/mL

## 2017-10-18 NOTE — Progress Notes (Signed)
WNL

## 2017-10-19 ENCOUNTER — Telehealth: Payer: Self-pay | Admitting: Rheumatology

## 2017-10-19 NOTE — Telephone Encounter (Signed)
Returned pts call. Pt did not answer phone. Left message. A prior authorization was submitted to pts insurance via cover my meds on 10/17/17. *see previous telephone note*  The plan will fax a determination within 1 to 5 business day. Will update once we receive a response.   Sreenidhi Ganson, [place], CPhT 2:11 PM

## 2017-10-19 NOTE — Telephone Encounter (Signed)
Patient called to let you know that she called [employer] and they said that the prior authorization for Simponi has not been completed yet.  Patient's CB# [phone]

## 2017-10-20 ENCOUNTER — Ambulatory Visit (HOSPITAL_BASED_OUTPATIENT_CLINIC_OR_DEPARTMENT_OTHER): Payer: 59 | Admitting: Pharmacist

## 2017-10-20 ENCOUNTER — Encounter: Payer: Self-pay | Admitting: Pharmacist

## 2017-10-20 ENCOUNTER — Other Ambulatory Visit: Payer: Self-pay | Admitting: Pharmacist

## 2017-10-20 DIAGNOSIS — Z79899 Other long term (current) drug therapy: Secondary | ICD-10-CM

## 2017-10-20 MED ORDER — GOLIMUMAB 50 MG/0.5ML ~~LOC~~ SOAJ: 50.0000 mg | SUBCUTANEOUS | 1 refills | Status: DC

## 2017-10-20 MED FILL — SIMPONI 50 MG/0.5ML SOAJ: 50 | 28 days supply | Qty: 1 | Fill #0

## 2017-10-20 NOTE — Progress Notes (Signed)
S: Patient presents today to the [facility].  Patient is currently taking Simponi for rheumatoid arthritis. Patient is managed by [facility] for this.   Adherence: denies any missed doses except for when she is sick and has to be on antibiotics. She will notice that she will start to get a flare if she is off of it for too long.   Dosing:  SubQ: 50 mg once a month   Drug-drug interactions: none  Screening: TB test: completed yearly at work Hepatitis: completed  Monitoring: S/sx of infection: denies CBC: WNL S/sx of hypersensitivity: denies S/sx of malignancy: denies S/sx of heart failure: denies S/sx of autoimmune disorder: denies  Of note, patient is waiting to get medication from the pharmacy - it needed a PA. I looked at the pharmacy records and it looks like it went through. Instructed patient to call the pharmacy so they can order the medication if they need to.   O:     Lab Results  Component Value Date   WBC 5.8 10/16/2017   HGB 13.3 10/16/2017   HCT 38.8 10/16/2017   MCV 90.4 10/16/2017   PLT 259 10/16/2017      Chemistry      Component Value Date/Time   NA 139 10/16/2017 0923   NA 138 02/04/2016   K 4.7 10/16/2017 0923   CL 105 10/16/2017 0923   CO2 28 10/16/2017 0923   BUN 19 10/16/2017 0923   BUN 20 02/04/2016   CREATININE 0.75 10/16/2017 0923   GLU 88 02/04/2016      Component Value Date/Time   CALCIUM 9.6 10/16/2017 0923   ALKPHOS 58 03/27/2017 1341   AST 11 10/16/2017 0923   ALT 20 10/16/2017 0923   BILITOT 0.3 10/16/2017 0923       A/P: 1. Medication review: Patient tolerating Simponi well with no adverse effects. Reviewed the medication with the patient, including the following: Simponi, golimumab, is a TNF blocker. There is an increased risk of infection and malignancy with this medication. Do not give patients live vaccinations while they are on this medication. Patient to follow up with  Dr. Estanislado Pandy as directed. No recommendations for any changes.  Christella Hartigan, PharmD, BCPS, BCACP, [facility] [phone]

## 2017-10-20 NOTE — Telephone Encounter (Signed)
Received a fax from [employer] with additional question for pts prior auth.  1. Should the request be for syringes or the auto-injectors? 2. Has pt experienced or maintained a 20% or greater improvement in tender joint count or swollen joint count while on therapy?   Called pt to verify what device she was using. Rx was sent for Auto injector. Pt states that she has been using the auto-injector. After reviewing pts profile, her CADI scores showed that she has maintained stable while on medication.  Called [employer] to update. Spoke with Jenny Reichmann who documented the pts profile. Her current authorization is good until 11/16/17.  We should have a response for this authorization within 24 hours.    Will send documents to scan center.  [name], [place], CPhT 11:05 AM

## 2017-10-23 ENCOUNTER — Ambulatory Visit
Admission: RE | Admit: 2017-10-23 | Discharge: 2017-10-23 | Disposition: A | Discharge status: Home / Self Care | Payer: 59 | Source: Ambulatory Visit | Attending: Radiology | Admitting: Radiology

## 2017-10-23 DIAGNOSIS — Z1231 Encounter for screening mammogram for malignant neoplasm of breast: Secondary | ICD-10-CM | POA: Diagnosis not present

## 2017-10-25 NOTE — Telephone Encounter (Signed)
Received a fax from [facility] regarding a prior authorization approval for [place] from 11/17/2017 to 11/16/2018.   Reference number:[id number] Phone number:[phone]  Will send document to scan center.  Diyari Cherne, [place], CPhT 9:13 AM

## 2017-11-06 DIAGNOSIS — M9903 Segmental and somatic dysfunction of lumbar region: Secondary | ICD-10-CM | POA: Diagnosis not present

## 2017-11-06 DIAGNOSIS — M9901 Segmental and somatic dysfunction of cervical region: Secondary | ICD-10-CM | POA: Diagnosis not present

## 2017-11-06 DIAGNOSIS — M5137 Other intervertebral disc degeneration, lumbosacral region: Secondary | ICD-10-CM | POA: Diagnosis not present

## 2017-11-06 DIAGNOSIS — M50322 Other cervical disc degeneration at C5-C6 level: Secondary | ICD-10-CM | POA: Diagnosis not present

## 2017-11-06 DIAGNOSIS — M5136 Other intervertebral disc degeneration, lumbar region: Secondary | ICD-10-CM | POA: Diagnosis not present

## 2017-11-06 DIAGNOSIS — M9902 Segmental and somatic dysfunction of thoracic region: Secondary | ICD-10-CM | POA: Diagnosis not present

## 2017-11-07 DIAGNOSIS — M50322 Other cervical disc degeneration at C5-C6 level: Secondary | ICD-10-CM | POA: Diagnosis not present

## 2017-11-07 DIAGNOSIS — M5136 Other intervertebral disc degeneration, lumbar region: Secondary | ICD-10-CM | POA: Diagnosis not present

## 2017-11-07 DIAGNOSIS — M9901 Segmental and somatic dysfunction of cervical region: Secondary | ICD-10-CM | POA: Diagnosis not present

## 2017-11-07 DIAGNOSIS — M9902 Segmental and somatic dysfunction of thoracic region: Secondary | ICD-10-CM | POA: Diagnosis not present

## 2017-11-07 DIAGNOSIS — M5137 Other intervertebral disc degeneration, lumbosacral region: Secondary | ICD-10-CM | POA: Diagnosis not present

## 2017-11-07 DIAGNOSIS — M9903 Segmental and somatic dysfunction of lumbar region: Secondary | ICD-10-CM | POA: Diagnosis not present

## 2017-11-13 DIAGNOSIS — M50322 Other cervical disc degeneration at C5-C6 level: Secondary | ICD-10-CM | POA: Diagnosis not present

## 2017-11-13 DIAGNOSIS — M5137 Other intervertebral disc degeneration, lumbosacral region: Secondary | ICD-10-CM | POA: Diagnosis not present

## 2017-11-13 DIAGNOSIS — M9903 Segmental and somatic dysfunction of lumbar region: Secondary | ICD-10-CM | POA: Diagnosis not present

## 2017-11-13 DIAGNOSIS — M9901 Segmental and somatic dysfunction of cervical region: Secondary | ICD-10-CM | POA: Diagnosis not present

## 2017-11-13 DIAGNOSIS — M9902 Segmental and somatic dysfunction of thoracic region: Secondary | ICD-10-CM | POA: Diagnosis not present

## 2017-11-13 DIAGNOSIS — M5136 Other intervertebral disc degeneration, lumbar region: Secondary | ICD-10-CM | POA: Diagnosis not present

## 2017-11-14 DIAGNOSIS — M50322 Other cervical disc degeneration at C5-C6 level: Secondary | ICD-10-CM | POA: Diagnosis not present

## 2017-11-14 DIAGNOSIS — M5137 Other intervertebral disc degeneration, lumbosacral region: Secondary | ICD-10-CM | POA: Diagnosis not present

## 2017-11-14 DIAGNOSIS — M9903 Segmental and somatic dysfunction of lumbar region: Secondary | ICD-10-CM | POA: Diagnosis not present

## 2017-11-14 DIAGNOSIS — M5136 Other intervertebral disc degeneration, lumbar region: Secondary | ICD-10-CM | POA: Diagnosis not present

## 2017-11-14 DIAGNOSIS — M9902 Segmental and somatic dysfunction of thoracic region: Secondary | ICD-10-CM | POA: Diagnosis not present

## 2017-11-14 DIAGNOSIS — M9901 Segmental and somatic dysfunction of cervical region: Secondary | ICD-10-CM | POA: Diagnosis not present

## 2017-11-20 DIAGNOSIS — M9902 Segmental and somatic dysfunction of thoracic region: Secondary | ICD-10-CM | POA: Diagnosis not present

## 2017-11-20 DIAGNOSIS — M5136 Other intervertebral disc degeneration, lumbar region: Secondary | ICD-10-CM | POA: Diagnosis not present

## 2017-11-20 DIAGNOSIS — M9903 Segmental and somatic dysfunction of lumbar region: Secondary | ICD-10-CM | POA: Diagnosis not present

## 2017-11-20 DIAGNOSIS — M9901 Segmental and somatic dysfunction of cervical region: Secondary | ICD-10-CM | POA: Diagnosis not present

## 2017-11-20 DIAGNOSIS — M5137 Other intervertebral disc degeneration, lumbosacral region: Secondary | ICD-10-CM | POA: Diagnosis not present

## 2017-11-20 DIAGNOSIS — M50322 Other cervical disc degeneration at C5-C6 level: Secondary | ICD-10-CM | POA: Diagnosis not present

## 2017-11-21 DIAGNOSIS — M9901 Segmental and somatic dysfunction of cervical region: Secondary | ICD-10-CM | POA: Diagnosis not present

## 2017-11-21 DIAGNOSIS — M50322 Other cervical disc degeneration at C5-C6 level: Secondary | ICD-10-CM | POA: Diagnosis not present

## 2017-11-21 DIAGNOSIS — M5137 Other intervertebral disc degeneration, lumbosacral region: Secondary | ICD-10-CM | POA: Diagnosis not present

## 2017-11-21 DIAGNOSIS — M9903 Segmental and somatic dysfunction of lumbar region: Secondary | ICD-10-CM | POA: Diagnosis not present

## 2017-11-21 DIAGNOSIS — M5136 Other intervertebral disc degeneration, lumbar region: Secondary | ICD-10-CM | POA: Diagnosis not present

## 2017-11-21 DIAGNOSIS — M9902 Segmental and somatic dysfunction of thoracic region: Secondary | ICD-10-CM | POA: Diagnosis not present

## 2017-11-28 MED FILL — SIMPONI 50 MG/0.5ML SOAJ: 50 | 28 days supply | Qty: 1 | Fill #1

## 2017-12-20 MED FILL — CLINDAMYCIN 75 MG/5 ML SOLN: 75 | 7 days supply | Qty: 200 | Fill #0

## 2017-12-21 MED FILL — AMOXICILLIN 250 MG/5 ML SUS: 250 | 7 days supply | Qty: 200 | Fill #0

## 2017-12-21 MED FILL — traMADol HCL 50 MG TABS: 50 | 3 days supply | Qty: 20 | Fill #0

## 2017-12-26 MED FILL — AMOXICILLIN 250 MG/5 ML SUS: 250 | 7 days supply | Qty: 200 | Fill #1

## 2018-01-08 DIAGNOSIS — M50322 Other cervical disc degeneration at C5-C6 level: Secondary | ICD-10-CM | POA: Diagnosis not present

## 2018-01-08 DIAGNOSIS — M9901 Segmental and somatic dysfunction of cervical region: Secondary | ICD-10-CM | POA: Diagnosis not present

## 2018-01-08 DIAGNOSIS — M9902 Segmental and somatic dysfunction of thoracic region: Secondary | ICD-10-CM | POA: Diagnosis not present

## 2018-01-08 DIAGNOSIS — M5136 Other intervertebral disc degeneration, lumbar region: Secondary | ICD-10-CM | POA: Diagnosis not present

## 2018-01-08 DIAGNOSIS — M5137 Other intervertebral disc degeneration, lumbosacral region: Secondary | ICD-10-CM | POA: Diagnosis not present

## 2018-01-08 DIAGNOSIS — M9903 Segmental and somatic dysfunction of lumbar region: Secondary | ICD-10-CM | POA: Diagnosis not present

## 2018-01-15 DIAGNOSIS — M9903 Segmental and somatic dysfunction of lumbar region: Secondary | ICD-10-CM | POA: Diagnosis not present

## 2018-01-15 DIAGNOSIS — M9901 Segmental and somatic dysfunction of cervical region: Secondary | ICD-10-CM | POA: Diagnosis not present

## 2018-01-15 DIAGNOSIS — M50322 Other cervical disc degeneration at C5-C6 level: Secondary | ICD-10-CM | POA: Diagnosis not present

## 2018-01-15 DIAGNOSIS — M5136 Other intervertebral disc degeneration, lumbar region: Secondary | ICD-10-CM | POA: Diagnosis not present

## 2018-01-15 DIAGNOSIS — M9902 Segmental and somatic dysfunction of thoracic region: Secondary | ICD-10-CM | POA: Diagnosis not present

## 2018-01-15 DIAGNOSIS — M5137 Other intervertebral disc degeneration, lumbosacral region: Secondary | ICD-10-CM | POA: Diagnosis not present

## 2018-01-17 MED FILL — SIMPONI 50 MG/0.5ML SOAJ: 50 | 28 days supply | Qty: 1 | Fill #2

## 2018-01-17 NOTE — Progress Notes (Signed)
Office Visit Note  Patient: Sharon Hunter             Date of Birth: April 11, 1961           MRN: [id number]             PCP: [name], DO Referring: [name], * Visit Date: 01/30/2018 Occupation: @GUAROCC @    Subjective:  Medication Management   History of Present Illness: ITZY ADLER is a 57 y.o. female with history of seropositive rheumatoid arthritis and osteoarthritis.  Her last injection of Simponi was 2 weeks ago.  She reports that she has been on and off of Simponi due to recurrent infections.  She states that she has had several abscess tooth and root canals.  She says she is no longer on antibiotics.  She states that a few months ago she did have a flare when she was off of her Symphony and was on a prednisone taper.  She denies any recent flares or prednisone tapers.  She states she continues to have bilateral CMC joint pain.  She denies any other joint pain or joint swelling at this time.  She states that she has been seeing a chiropractor once a week for management of her scoliosis.  She states that she continues to do weight lifting and she exercises on a regular basis.   Activities of Daily Living:  Patient reports morning stiffness for 0  minutes.   Patient Denies nocturnal pain.  Difficulty dressing/grooming: Denies Difficulty climbing stairs: Denies Difficulty getting out of chair: Denies Difficulty using hands for taps, buttons, cutlery, and/or writing: Denies   Review of Systems  Constitutional: Negative for fatigue.  HENT: Positive for nose dryness. Negative for mouth sores and mouth dryness.   Eyes: Negative for pain, visual disturbance and dryness.  Respiratory: Negative for cough, hemoptysis, shortness of breath and difficulty breathing.   Cardiovascular: Negative for chest pain, palpitations, hypertension and swelling in legs/feet.  Gastrointestinal: Negative for blood in stool, constipation and diarrhea.  Endocrine: Negative for  increased urination.  Genitourinary: Negative for painful urination.  Musculoskeletal: Positive for arthralgias and joint pain. Negative for joint swelling, myalgias, muscle weakness, morning stiffness, muscle tenderness and myalgias.  Skin: Positive for color change. Negative for pallor, rash, hair loss, nodules/bumps, skin tightness, ulcers and sensitivity to sunlight.  Allergic/Immunologic: Negative for susceptible to infections.  Neurological: Negative for dizziness, numbness, headaches and weakness.  Hematological: Negative for swollen glands.  Psychiatric/Behavioral: Negative for depressed mood and sleep disturbance. The patient is not nervous/anxious.     PMFS History:  Patient Active Problem List   Diagnosis Date Noted  . History of anxiety 03/27/2017  . History of hypertension 03/27/2017  . History of bundle branch block 03/27/2017  . Primary osteoarthritis of both hands 12/22/2016  . History of shingles 08/02/2016  . High risk medication use 08/02/2016  . Hypertension 08/01/2016  . IBS (irritable bowel syndrome) 08/01/2016  . Anxiety 08/01/2016  . 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 Rate-related bundle branch block 01/30/2015  . Palpitations 01/30/2015  . Pulmonary nodule 07/04/2011  . Murmur 02/03/2011  . Rheumatoid arthritis ([place]) 02/03/2011  . Hair loss 02/03/2011  . Fatigue 02/03/2011    Past Medical History:  Diagnosis Date  . Anxiety   . Breast lump    stable-- gets annual mammogram  . 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 Hypertension   . IBS (irritable bowel syndrome) 08/01/2016  .  Migraine   . Murmur 02/03/2011  . Ovarian failure   . Pulmonary nodule, left 2009   last CT Nov 2011-- due 08/2011  . Rheumatoid arthritis(714.0)    in remission , off meds as of Summer 2012    Family History  Problem Relation Age of Onset  . Alcohol abuse Father   . Hyperlipidemia Father   . Heart disease Father   . Alcohol abuse Brother   .  Diabetes Brother   . Hypertension Brother   . Arthritis Mother   . Uterine cancer Mother   . Hypertension Mother   . Heart disease Sister        cardiac stent  . Diabetes Sister   . Hyperlipidemia Sister   . Hypertension Sister   . Cancer Brother        lung  . Cancer Maternal Aunt        lung----non smoker   Past Surgical History:  Procedure Laterality Date  . CHOLECYSTECTOMY  1999  . COLONOSCOPY  10/31/11  . EXPLORATORY LAPAROTOMY    . TONSILLECTOMY  1969  . WISDOM TOOTH EXTRACTION     Social History   Social History Narrative   Exercise-- jogs     Objective: Vital Signs: BP 118/62 (BP Location: Left Arm, Patient Position: Sitting, Cuff Size: Normal)   Pulse 74   Resp 14   Ht 5' 6.5" (1.689 m)   Wt 173 lb (78.5 kg)   LMP 10/03/2005   BMI 27.50 kg/m    Physical Exam  Constitutional: She is oriented to person, place, and time. She appears well-developed and well-nourished.  HENT:  Head: Normocephalic and atraumatic.  Eyes: Conjunctivae and EOM are normal.  Neck: Normal range of motion.  Cardiovascular: Normal rate, regular rhythm, normal heart sounds and intact distal pulses.  Pulmonary/Chest: Effort normal and breath sounds normal.  Abdominal: Soft. Bowel sounds are normal.  Lymphadenopathy:    She has no cervical adenopathy.  Neurological: She is alert and oriented to person, place, and time.  Skin: Skin is warm and dry. Capillary refill takes less than 2 seconds.  Psychiatric: She has a normal mood and affect. Her behavior is normal.  Nursing note and vitals reviewed.    Musculoskeletal Exam: C-spine, thoracic spine, lumbar spine good range of motion.  No midline spinal tenderness.  No SI joint tenderness.  Shoulder joints, elbow joints, wrist joints, MCPs, PIPs, DIPs good range of motion with no synovitis. PIPs and DIPs synovial thickening consistent with osteoarthritis..  She has no warmth or effusion of bilateral knees.  She has bilateral knee crepitus.   She has tenderness of bilateral MTP joints. PIP and DIP synovial thickening consistent with osteoarthritis of bilateral feet. No tenderness of trochanteric bursa bilaterally.  CDAI Exam: CDAI Homunculus Exam:   Joint Counts:  CDAI Tender Joint count: 0 CDAI Swollen Joint count: 0  Global Assessments:  Patient Global Assessment: 1 Provider Global Assessment: 1  CDAI Calculated Score: 2    Investigation: No additional findings.TB Gold: 10/16/2017 Negative  CBC Latest Ref Rng & Units 10/16/2017 03/27/2017 12/27/2016  WBC 3.8 - 10.8 Thousand/uL 5.8 10.7 7.4  Hemoglobin 11.7 - 15.5 g/dL 13.3 13.9 13.5  Hematocrit 35.0 - 45.0 % 38.8 42.1 41.0  Platelets 140 - 400 Thousand/uL 259 307 277   CMP Latest Ref Rng & Units 10/16/2017 03/27/2017 12/27/2016  Glucose 65 - 99 mg/dL 102(H) 81 75  BUN 7 - 25 mg/dL 19 15 22   Creatinine 0.50 - 1.05 mg/dL  0.75 0.78 1.06(H)  Sodium 135 - 146 mmol/L 139 140 141  Potassium 3.5 - 5.3 mmol/L 4.7 4.4 4.4  Chloride 98 - 110 mmol/L 105 102 108  CO2 20 - 32 mmol/L 28 25 25   Calcium 8.6 - 10.4 mg/dL 9.6 9.6 9.4  Total Protein 6.1 - 8.1 g/dL 7.0 6.8 6.8  Total Bilirubin 0.2 - 1.2 mg/dL 0.3 0.7 0.3  Alkaline Phos 33 - 130 U/L - 58 62  AST 10 - 35 U/L 11 9(L) 12  ALT 6 - 29 U/L 20 17 21     Imaging: No results found.  Speciality Comments: No specialty comments available.    Procedures:  No procedures performed Allergies: Other and Contrast media [iodinated diagnostic agents]   Assessment / Plan:     Visit Diagnoses: Rheumatoid arthritis involving multiple sites with positive rheumatoid factor ([place]): She has no synovitis on exam.  She has no joint pain or joint swelling at this time. She has osteoarthritic changes in bilateral feet.  She was advised to try using toe spacers and shoes with a back for more support. She has her last Simponi subcutaneous injection 2 weeks ago.  She has missed several doses over the past few months due to recurrent dental  infections.  She is no longer on Omnicef.  She has not had any recent flares.  She will continue injecting Simponi subcutaneously every 30 days.    High risk medication use - Simponi.  CBC and CMP were ordered today to monitor for drug toxicity. - Plan: CBC with Differential/Platelet, COMPLETE METABOLIC PANEL WITH GFR  Primary osteoarthritis of both hands: She has PIP and DIP synovial thickening consistent with osteoarthritis.  Joint protection and muscle strengthening were discussed.   History of vitamin D deficiency - She has a history of vitamin D deficiency.  She requested that we recheck vitamin D today. Plan: VITAMIN D 25 Hydroxy (Vit-D Deficiency, Fractures)  Encounter for health-related screening -She requested her lipid panel to be checked with her lab work today. Plan: Lipid panel   Other medical conditions are listed as follows:  Other fatigue  Pulmonary nodule  History of anxiety  History of bundle branch block  History of hypertension  History of shingles    Orders: Orders Placed This Encounter  Procedures  . CBC with Differential/Platelet  . COMPLETE METABOLIC PANEL WITH GFR  . Lipid panel  . VITAMIN D 25 Hydroxy (Vit-D Deficiency, Fractures)   No orders of the defined types were placed in this encounter.     Follow-Up Instructions: Return in about 5 months (around 07/02/2018) for Rheumatoid arthritis, Osteoarthritis.   Bo Merino, MD  Note - This record has been created using Editor, commissioning.  Chart creation errors have been sought, but may not always  have been located. Such creation errors do not reflect on  the standard of medical care.

## 2018-01-23 DIAGNOSIS — M9901 Segmental and somatic dysfunction of cervical region: Secondary | ICD-10-CM | POA: Diagnosis not present

## 2018-01-23 DIAGNOSIS — M5136 Other intervertebral disc degeneration, lumbar region: Secondary | ICD-10-CM | POA: Diagnosis not present

## 2018-01-23 DIAGNOSIS — M5137 Other intervertebral disc degeneration, lumbosacral region: Secondary | ICD-10-CM | POA: Diagnosis not present

## 2018-01-23 DIAGNOSIS — M9902 Segmental and somatic dysfunction of thoracic region: Secondary | ICD-10-CM | POA: Diagnosis not present

## 2018-01-23 DIAGNOSIS — M50322 Other cervical disc degeneration at C5-C6 level: Secondary | ICD-10-CM | POA: Diagnosis not present

## 2018-01-23 DIAGNOSIS — M9903 Segmental and somatic dysfunction of lumbar region: Secondary | ICD-10-CM | POA: Diagnosis not present

## 2018-01-30 ENCOUNTER — Encounter: Payer: Self-pay | Admitting: Rheumatology

## 2018-01-30 ENCOUNTER — Ambulatory Visit: Payer: 59 | Admitting: Rheumatology

## 2018-01-30 VITALS — BP 118/62 | HR 74 | Resp 14 | Ht 66.5 in | Wt 173.0 lb

## 2018-01-30 DIAGNOSIS — M5136 Other intervertebral disc degeneration, lumbar region: Secondary | ICD-10-CM | POA: Diagnosis not present

## 2018-01-30 DIAGNOSIS — M19041 Primary osteoarthritis, right hand: Secondary | ICD-10-CM

## 2018-01-30 DIAGNOSIS — R5383 Other fatigue: Secondary | ICD-10-CM

## 2018-01-30 DIAGNOSIS — M0579 Rheumatoid arthritis with rheumatoid factor of multiple sites without organ or systems involvement: Secondary | ICD-10-CM

## 2018-01-30 DIAGNOSIS — Z8679 Personal history of other diseases of the circulatory system: Secondary | ICD-10-CM

## 2018-01-30 DIAGNOSIS — Z139 Encounter for screening, unspecified: Secondary | ICD-10-CM

## 2018-01-30 DIAGNOSIS — M9902 Segmental and somatic dysfunction of thoracic region: Secondary | ICD-10-CM | POA: Diagnosis not present

## 2018-01-30 DIAGNOSIS — R911 Solitary pulmonary nodule: Secondary | ICD-10-CM

## 2018-01-30 DIAGNOSIS — Z8619 Personal history of other infectious and parasitic diseases: Secondary | ICD-10-CM | POA: Diagnosis not present

## 2018-01-30 DIAGNOSIS — Z8639 Personal history of other endocrine, nutritional and metabolic disease: Secondary | ICD-10-CM

## 2018-01-30 DIAGNOSIS — Z8659 Personal history of other mental and behavioral disorders: Secondary | ICD-10-CM

## 2018-01-30 DIAGNOSIS — M9903 Segmental and somatic dysfunction of lumbar region: Secondary | ICD-10-CM | POA: Diagnosis not present

## 2018-01-30 DIAGNOSIS — Z79899 Other long term (current) drug therapy: Secondary | ICD-10-CM

## 2018-01-30 DIAGNOSIS — M5137 Other intervertebral disc degeneration, lumbosacral region: Secondary | ICD-10-CM | POA: Diagnosis not present

## 2018-01-30 DIAGNOSIS — M50322 Other cervical disc degeneration at C5-C6 level: Secondary | ICD-10-CM | POA: Diagnosis not present

## 2018-01-30 DIAGNOSIS — M19042 Primary osteoarthritis, left hand: Secondary | ICD-10-CM

## 2018-01-30 DIAGNOSIS — M9901 Segmental and somatic dysfunction of cervical region: Secondary | ICD-10-CM | POA: Diagnosis not present

## 2018-01-30 LAB — CBC WITH DIFFERENTIAL/PLATELET
BASOS ABS: 39 {cells}/uL (ref 0–200)
Basophils Relative: 0.5 %
EOS ABS: 139 {cells}/uL (ref 15–500)
Eosinophils Relative: 1.8 %
HEMATOCRIT: 38.7 % (ref 35.0–45.0)
Hemoglobin: 13.1 g/dL (ref 11.7–15.5)
LYMPHS ABS: 3049 {cells}/uL (ref 850–3900)
MCH: 30.2 pg (ref 27.0–33.0)
MCHC: 33.9 g/dL (ref 32.0–36.0)
MCV: 89.2 fL (ref 80.0–100.0)
MPV: 11 fL (ref 7.5–12.5)
Monocytes Relative: 7.6 %
Neutro Abs: 3889 cells/uL (ref 1500–7800)
Neutrophils Relative %: 50.5 %
PLATELETS: 261 10*3/uL (ref 140–400)
RBC: 4.34 10*6/uL (ref 3.80–5.10)
RDW: 12.5 % (ref 11.0–15.0)
TOTAL LYMPHOCYTE: 39.6 %
WBC: 7.7 10*3/uL (ref 3.8–10.8)
WBCMIX: 585 {cells}/uL (ref 200–950)

## 2018-01-30 NOTE — Patient Instructions (Signed)
Standing Labs We placed an order today for your standing lab work.    Please come back and get your standing labs in August and every 3 months   We have open lab Monday through Friday from 8:30-11:30 AM and 1:30-4:00 PM  at the office of Dr. Damariz Paganelli.   You may experience shorter wait times on Monday and Friday afternoons. The office is located at [address], [address], [place], [place] [address] No appointment is necessary.   Labs are drawn by [facility].  You may receive a bill from [facility] for your lab work. If you have any questions regarding directions or hours of operation,  please call [phone].    

## 2018-01-31 LAB — COMPLETE METABOLIC PANEL WITH GFR
AG Ratio: 1.8 (calc) (ref 1.0–2.5)
ALKALINE PHOSPHATASE (APISO): 70 U/L (ref 33–130)
ALT: 24 U/L (ref 6–29)
AST: 13 U/L (ref 10–35)
Albumin: 4.4 g/dL (ref 3.6–5.1)
BUN: 16 mg/dL (ref 7–25)
CALCIUM: 9.7 mg/dL (ref 8.6–10.4)
CO2: 27 mmol/L (ref 20–32)
Chloride: 104 mmol/L (ref 98–110)
Creat: 0.73 mg/dL (ref 0.50–1.05)
GFR, EST NON AFRICAN AMERICAN: 92 mL/min/{1.73_m2} (ref >=60)
GFR, Est African American: 107 mL/min/{1.73_m2} (ref >=60)
GLOBULIN: 2.5 g/dL (ref 1.9–3.7)
Glucose, Bld: 87 mg/dL (ref 65–139)
POTASSIUM: 4.2 mmol/L (ref 3.5–5.3)
SODIUM: 140 mmol/L (ref 135–146)
Total Bilirubin: 0.6 mg/dL (ref 0.2–1.2)
Total Protein: 6.9 g/dL (ref 6.1–8.1)

## 2018-01-31 LAB — LIPID PANEL
Cholesterol: 242 mg/dL — ABNORMAL HIGH (ref <200)
HDL: 87 mg/dL (ref >50)
LDL Cholesterol (Calc): 133 mg/dL (calc) — ABNORMAL HIGH
NON-HDL CHOLESTEROL (CALC): 155 mg/dL — AB (ref <130)
Total CHOL/HDL Ratio: 2.8 (calc) (ref <5.0)
Triglycerides: 111 mg/dL (ref <150)

## 2018-01-31 LAB — VITAMIN D 25 HYDROXY (VIT D DEFICIENCY, FRACTURES): Vit D, 25-Hydroxy: 32 ng/mL (ref 30–100)

## 2018-01-31 NOTE — Progress Notes (Signed)
Vitamin D is WNL.  She can take a maintenance dose of Vitamin D 1,000 units daily.  CBC and CMP WNL.  Cholesterol and LDL are elevated.  Please forward results to PCP.

## 2018-02-06 DIAGNOSIS — M9903 Segmental and somatic dysfunction of lumbar region: Secondary | ICD-10-CM | POA: Diagnosis not present

## 2018-02-06 DIAGNOSIS — M5136 Other intervertebral disc degeneration, lumbar region: Secondary | ICD-10-CM | POA: Diagnosis not present

## 2018-02-06 DIAGNOSIS — M5137 Other intervertebral disc degeneration, lumbosacral region: Secondary | ICD-10-CM | POA: Diagnosis not present

## 2018-02-06 DIAGNOSIS — M9901 Segmental and somatic dysfunction of cervical region: Secondary | ICD-10-CM | POA: Diagnosis not present

## 2018-02-06 DIAGNOSIS — M9902 Segmental and somatic dysfunction of thoracic region: Secondary | ICD-10-CM | POA: Diagnosis not present

## 2018-02-06 DIAGNOSIS — M50322 Other cervical disc degeneration at C5-C6 level: Secondary | ICD-10-CM | POA: Diagnosis not present

## 2018-02-19 ENCOUNTER — Other Ambulatory Visit: Payer: Self-pay | Admitting: Internal Medicine

## 2018-02-19 ENCOUNTER — Telehealth: Payer: Self-pay | Admitting: Rheumatology

## 2018-02-19 ENCOUNTER — Other Ambulatory Visit: Payer: Self-pay | Admitting: Rheumatology

## 2018-02-19 DIAGNOSIS — M9902 Segmental and somatic dysfunction of thoracic region: Secondary | ICD-10-CM | POA: Diagnosis not present

## 2018-02-19 DIAGNOSIS — M9903 Segmental and somatic dysfunction of lumbar region: Secondary | ICD-10-CM | POA: Diagnosis not present

## 2018-02-19 DIAGNOSIS — M50322 Other cervical disc degeneration at C5-C6 level: Secondary | ICD-10-CM | POA: Diagnosis not present

## 2018-02-19 DIAGNOSIS — M5136 Other intervertebral disc degeneration, lumbar region: Secondary | ICD-10-CM | POA: Diagnosis not present

## 2018-02-19 DIAGNOSIS — M9901 Segmental and somatic dysfunction of cervical region: Secondary | ICD-10-CM | POA: Diagnosis not present

## 2018-02-19 DIAGNOSIS — M5137 Other intervertebral disc degeneration, lumbosacral region: Secondary | ICD-10-CM | POA: Diagnosis not present

## 2018-02-19 MED ORDER — GOLIMUMAB 50 MG/0.5ML ~~LOC~~ SOAJ: SUBCUTANEOUS | 0 refills | Status: DC

## 2018-02-19 MED FILL — SIMPONI 50 MG/0.5ML SOAJ: 50 | 30 days supply | Qty: 1 | Fill #0

## 2018-02-19 NOTE — Telephone Encounter (Signed)
Last visit: 01/30/18 Next visit: 07/31/18 Labs: 01/30/18 CBC and CMP WNL TB gold: 10/16/17 Neg   Okay to refill per Dr. Estanislado Pandy

## 2018-02-19 NOTE — Telephone Encounter (Signed)
Patient called requesting prescription refill of Simponi.  Patient states she is currently out of medication and was due for an injection last week.

## 2018-02-19 NOTE — Telephone Encounter (Signed)
Left message to advise patient prescription has been sent to the pharmacy.  

## 2018-04-11 ENCOUNTER — Telehealth: Payer: Self-pay | Admitting: Rheumatology

## 2018-04-11 NOTE — Telephone Encounter (Signed)
Authorization for Simponi has been submitted to patient's insurance via Cover my Meds. Will update once we receive a response.  11:13 AM Beatriz Chancellor, CPhT

## 2018-04-11 NOTE — Telephone Encounter (Signed)
Patient called stating that she called [facility] to get her Simponi injection and they informed her that a prior authorization has not been completed. CB#[phone].  Thank you.

## 2018-04-13 MED FILL — SIMPONI 50 MG/0.5ML SOAJ: 50 | 28 days supply | Qty: 1 | Fill #1

## 2018-04-19 ENCOUNTER — Telehealth (INDEPENDENT_AMBULATORY_CARE_PROVIDER_SITE_OTHER): Payer: Self-pay | Admitting: Radiology

## 2018-04-19 NOTE — Telephone Encounter (Signed)
simponi 50 mg/0.104ml auto injectors  Approvedon July 12  The request has been approved. The authorization is effective for a maximum of 12 fills from 04/13/2018 to 04/13/2019, as long as the member is enrolled in their current health plan. The request was approved as submitted. This request is approved for 0.67mL of the 50mg  prefilled SmartJect autoinjector per 28 days.

## 2018-05-21 MED FILL — SIMPONI 50 MG/0.5ML SOAJ: 50 | 28 days supply | Qty: 1 | Fill #2

## 2018-06-05 ENCOUNTER — Other Ambulatory Visit: Payer: Self-pay | Admitting: *Deleted

## 2018-06-05 DIAGNOSIS — Z79899 Other long term (current) drug therapy: Secondary | ICD-10-CM

## 2018-06-05 LAB — CBC WITH DIFFERENTIAL/PLATELET
BASOS PCT: 0.4 %
Basophils Absolute: 27 cells/uL (ref 0–200)
Eosinophils Absolute: 102 cells/uL (ref 15–500)
Eosinophils Relative: 1.5 %
HCT: 39.4 % (ref 35.0–45.0)
Hemoglobin: 13 g/dL (ref 11.7–15.5)
Lymphs Abs: 2883 cells/uL (ref 850–3900)
MCH: 30.5 pg (ref 27.0–33.0)
MCHC: 33 g/dL (ref 32.0–36.0)
MCV: 92.5 fL (ref 80.0–100.0)
MONOS PCT: 6.8 %
MPV: 10.6 fL (ref 7.5–12.5)
Neutro Abs: 3325 cells/uL (ref 1500–7800)
Neutrophils Relative %: 48.9 %
PLATELETS: 275 10*3/uL (ref 140–400)
RBC: 4.26 10*6/uL (ref 3.80–5.10)
RDW: 12.5 % (ref 11.0–15.0)
TOTAL LYMPHOCYTE: 42.4 %
WBC: 6.8 10*3/uL (ref 3.8–10.8)
WBCMIX: 462 {cells}/uL (ref 200–950)

## 2018-06-05 LAB — COMPLETE METABOLIC PANEL WITH GFR
AG Ratio: 1.6 (calc) (ref 1.0–2.5)
ALKALINE PHOSPHATASE (APISO): 65 U/L (ref 33–130)
ALT: 21 U/L (ref 6–29)
AST: 13 U/L (ref 10–35)
Albumin: 4.2 g/dL (ref 3.6–5.1)
BUN: 17 mg/dL (ref 7–25)
CO2: 27 mmol/L (ref 20–32)
CREATININE: 0.9 mg/dL (ref 0.50–1.05)
Calcium: 9.6 mg/dL (ref 8.6–10.4)
Chloride: 103 mmol/L (ref 98–110)
GFR, Est African American: 82 mL/min/{1.73_m2} (ref >=60)
GFR, Est Non African American: 71 mL/min/{1.73_m2} (ref >=60)
GLOBULIN: 2.6 g/dL (ref 1.9–3.7)
GLUCOSE: 89 mg/dL (ref 65–99)
Potassium: 4.5 mmol/L (ref 3.5–5.3)
SODIUM: 139 mmol/L (ref 135–146)
Total Bilirubin: 0.5 mg/dL (ref 0.2–1.2)
Total Protein: 6.8 g/dL (ref 6.1–8.1)

## 2018-07-10 ENCOUNTER — Other Ambulatory Visit: Payer: Self-pay | Admitting: Pharmacist

## 2018-07-10 ENCOUNTER — Telehealth: Payer: Self-pay | Admitting: Rheumatology

## 2018-07-10 MED ORDER — GOLIMUMAB 50 MG/0.5ML ~~LOC~~ SOAJ
SUBCUTANEOUS | 0 refills | Status: DC
Start: 2018-07-10 — End: 2018-09-12

## 2018-07-10 MED ORDER — GOLIMUMAB 50 MG/0.5ML ~~LOC~~ SOAJ: SUBCUTANEOUS | 0 refills | Status: DC

## 2018-07-10 MED FILL — SIMPONI 50 MG/0.5ML SOAJ: 50 | 30 days supply | Qty: 1 | Fill #0

## 2018-07-10 NOTE — Telephone Encounter (Signed)
Last visit: 01/30/18 Next visit: 07/31/18 Labs:06/05/18 CBC and CMP WNL TB gold: 10/16/17 Neg   Okay to refill per Dr. Estanislado Pandy

## 2018-07-10 NOTE — Telephone Encounter (Signed)
Patient called checking on the status of her prescription refill of Simponi.  Patient states she was due to take her medication last week.   Patient requests the refill be sent to [facility].

## 2018-07-10 NOTE — Telephone Encounter (Signed)
Patient advised prescription has been sent to the pharmacy.  

## 2018-07-17 NOTE — Progress Notes (Signed)
Office Visit Note  Patient: [name]             Date of Birth: 03/12/1961           MRN: [id number]             PCP: [name], DO Referring: [name], * Visit Date: 07/31/2018 Occupation: @GUAROCC @  Subjective:  Lower back pain   History of Present Illness: AYASHA ELLINGSEN is a 57 y.o. female with history of seropositive rheumatoid arthritis and osteoarthritis.  She is on Simponi sq injections every 30 days.  She uses voltaren gel PRN and takes Advil PRN for pain relief. She denies any recent rheumatoid arthritis flares.  She continues to have some recurrent infections which caused her to postpone her Simponi injections.  She still feels as though Simponi has been very effective for her.  She states she is been having some discomfort in bilateral shoulder joints.  She states at times is limited range of motion due to pain.  She states she also has pain at night when lying on her sides at times.  She states that she is previously lifting weights on a regular basis but has not for 1 month due to experiencing worsening lower back pain.  She states that she has a new diagnosis of scoliosis and has been seeing a Restaurant manager, fast food.  She states that she continues to be symptomatic so she will be starting physical therapy on Thursday.  She states that she is still very active and hikes and bikes on a regular basis.  She states that she has osteoarthritis pain in bilateral hands.  She states she is has some tenderness and DIP joints but denies any other joint pain or joint swelling at this time.   Activities of Daily Living:  Patient reports morning stiffness for 20  minutes.   Patient Reports nocturnal pain.  Difficulty dressing/grooming: Denies  Difficulty climbing stairs: Reports Difficulty getting out of chair: Reports Difficulty using hands for taps, buttons, cutlery, and/or writing: Denies  Review of Systems  Constitutional: Positive for fatigue.  HENT: Negative for  mouth sores, mouth dryness and nose dryness.   Eyes: Positive for dryness. Negative for pain and visual disturbance.  Respiratory: Positive for cough. Negative for hemoptysis, shortness of breath and difficulty breathing.   Cardiovascular: Positive for palpitations. Negative for chest pain, hypertension and swelling in legs/feet.  Gastrointestinal: Negative for blood in stool, constipation and diarrhea.  Endocrine: Negative for increased urination.  Genitourinary: Negative for painful urination.  Musculoskeletal: Positive for arthralgias, joint pain and morning stiffness. Negative for joint swelling, myalgias, muscle weakness, muscle tenderness and myalgias.  Skin: Negative for color change, pallor, rash, hair loss, nodules/bumps, skin tightness, ulcers and sensitivity to sunlight.  Allergic/Immunologic: Negative for susceptible to infections.  Neurological: Negative for dizziness, numbness, headaches and weakness.  Hematological: Negative for swollen glands.  Psychiatric/Behavioral: Positive for sleep disturbance. Negative for depressed mood. The patient is not nervous/anxious.     PMFS History:  Patient Active Problem List   Diagnosis Date Noted  . History of anxiety 03/27/2017  . History of hypertension 03/27/2017  . History of bundle branch block 03/27/2017  . Primary osteoarthritis of both hands 12/22/2016  . History of shingles 08/02/2016  . High risk medication use 08/02/2016  . Hypertension 08/01/2016  . IBS (irritable bowel syndrome) 08/01/2016  . Anxiety 08/01/2016  . 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 Rate-related bundle branch block 01/30/2015  .  Palpitations 01/30/2015  . Pulmonary nodule 07/04/2011  . Murmur 02/03/2011  . Rheumatoid arthritis ([place]) 02/03/2011  . Hair loss 02/03/2011  . Fatigue 02/03/2011    Past Medical History:  Diagnosis Date  . Anxiety   . Breast lump    stable-- gets annual mammogram  . History of primary ovarian failure  08/01/2016  . Hypertension   . IBS (irritable bowel syndrome) 08/01/2016  . Migraine   . Murmur 02/03/2011  . Ovarian failure   . Pulmonary nodule, left 2009   last CT Nov 2011-- due 08/2011  . Rheumatoid arthritis(714.0)    in remission , off meds as of Summer 2012    Family History  Problem Relation Age of Onset  . Alcohol abuse Father   . Hyperlipidemia Father   . Heart disease Father   . Alcohol abuse Brother   . Diabetes Brother   . Hypertension Brother   . Arthritis Mother   . Uterine cancer Mother   . Hypertension Mother   . Heart disease Sister        cardiac stent  . Diabetes Sister   . Hyperlipidemia Sister   . Hypertension Sister   . Cancer Brother        lung  . Cancer Maternal Aunt        lung----non smoker   Past Surgical History:  Procedure Laterality Date  . CHOLECYSTECTOMY  1999  . COLONOSCOPY  10/31/11  . EXPLORATORY LAPAROTOMY    . TONSILLECTOMY  1969  . WISDOM TOOTH EXTRACTION     Social History   Social History Narrative   Exercise-- jogs    Objective: Vital Signs: BP 123/72 (BP Location: Left Arm, Patient Position: Sitting, Cuff Size: Normal)   Pulse 67   Resp 13   Ht 5' 6.5" (1.689 m)   Wt 175 lb (79.4 kg)   LMP 10/03/2005   BMI 27.82 kg/m    Physical Exam  Constitutional: She is oriented to person, place, and time. She appears well-developed and well-nourished.  HENT:  Head: Normocephalic and atraumatic.  Eyes: Conjunctivae and EOM are normal.  Neck: Normal range of motion.  Cardiovascular: Normal rate, regular rhythm and intact distal pulses.  Murmur heard. Pulmonary/Chest: Effort normal and breath sounds normal.  Abdominal: Soft. Bowel sounds are normal.  Lymphadenopathy:    She has no cervical adenopathy.  Neurological: She is alert and oriented to person, place, and time.  Skin: Skin is warm and dry. Capillary refill takes less than 2 seconds.  Psychiatric: She has a normal mood and affect. Her behavior is normal.    Nursing note and vitals reviewed.    Musculoskeletal Exam: C-spine good range of motion.  Discomfort with lumbar range of motion.  She has midline spinal tenderness lumbar region as well as right SI joint tenderness.  Shoulder joints good range of motion with no discomfort.  Elbow joints, wrist joints, MCPs and PIPs, DIPs good range of motion no synovitis.  She is complete fist formation bilaterally.  Hip joints good range of motion with no discomfort.  Knee joints, ankle joints, MTPs and PIPs, DIPs good range of motion no synovitis.  No warmth or effusion bilateral knee joints.  No tenderness or swelling of ankle joints.  CDAI Exam: CDAI Score: 0.2  Patient Global Assessment: 1 (mm); Provider Global Assessment: 1 (mm) Swollen: 0 ; Tender: 0  Joint Exam   Not documented   There is currently no information documented on the homunculus. Go to the Rheumatology  activity and complete the homunculus joint exam.  Investigation: No additional findings.  Imaging: No results found.  Recent Labs: Lab Results  Component Value Date   WBC 6.8 06/05/2018   HGB 13.0 06/05/2018   PLT 275 06/05/2018   NA 139 06/05/2018   K 4.5 06/05/2018   CL 103 06/05/2018   CO2 27 06/05/2018   GLUCOSE 89 06/05/2018   BUN 17 06/05/2018   CREATININE 0.90 06/05/2018   BILITOT 0.5 06/05/2018   ALKPHOS 58 03/27/2017   AST 13 06/05/2018   ALT 21 06/05/2018   PROT 6.8 06/05/2018   ALBUMIN 4.3 03/27/2017   CALCIUM 9.6 06/05/2018   GFRAA 82 06/05/2018   QFTBGOLDPLUS NEGATIVE 10/16/2017    Speciality Comments: No specialty comments available.  Procedures:  No procedures performed Allergies: Other and Contrast media [iodinated diagnostic agents]   Assessment / Plan:     Visit Diagnoses: Rheumatoid arthritis involving multiple sites with positive rheumatoid factor ([place]): She has no synovitis on exam.  She has not had any recent rheumatoid arthritis flares.  She is clinically doing well on Simponi  subcutaneous injections every 30 days.  She has intermittent bilateral shoulder pain.  She has good range of motion with no discomfort on exam today.  She will continue on this current treatment regimen.  She was advised to notify us that she does increased joint pain or joint swelling.  She will follow-up in the office in 5 months return for lab work in December.  High risk medication use - Simponi. -CBC and CMP are within normal limits on 06/05/2018.  She will return for lab work in December and every 3 months to monitor for drug toxicity.  TB gold was negative on 10/16/2017.  Future order for TB gold was placed today.  Plan: QuantiFERON-TB Gold Plus  Primary osteoarthritis of both hands: She has DIP synovial thickening consistent with osteoarthritis of bilateral hands.  She has complete fist formation bilaterally.  She has some discomfort in the DIP joints.  Joint protection and muscle strengthening were discussed.   Other fatigue: Chronic   Other medical conditions are listed as follows:  History of vitamin D deficiency  History of shingles  Pulmonary nodule  History of bundle branch block  History of hypertension  History of anxiety    Orders: Orders Placed This Encounter  Procedures  . QuantiFERON-TB Gold Plus   No orders of the defined types were placed in this encounter.    Follow-Up Instructions: Return in about 5 months (around 12/30/2018) for Rheumatoid arthritis, Osteoarthritis.   Ofilia Neas, PA-C   I examined and evaluated the patient with Hazel Sams PA.  Patient had no synovitis on examination today.  She continues to do well on current therapy.  The plan of care was discussed as noted above.  Bo Merino, MD  Note - This record has been created using Editor, commissioning.  Chart creation errors have been sought, but may not always  have been located. Such creation errors do not reflect on  the standard of medical care.

## 2018-07-31 ENCOUNTER — Ambulatory Visit: Payer: 59 | Admitting: Rheumatology

## 2018-07-31 ENCOUNTER — Encounter: Payer: Self-pay | Admitting: Rheumatology

## 2018-07-31 VITALS — BP 123/72 | HR 67 | Resp 13 | Ht 66.5 in | Wt 175.0 lb

## 2018-07-31 DIAGNOSIS — Z8679 Personal history of other diseases of the circulatory system: Secondary | ICD-10-CM

## 2018-07-31 DIAGNOSIS — Z8639 Personal history of other endocrine, nutritional and metabolic disease: Secondary | ICD-10-CM | POA: Diagnosis not present

## 2018-07-31 DIAGNOSIS — R5383 Other fatigue: Secondary | ICD-10-CM | POA: Diagnosis not present

## 2018-07-31 DIAGNOSIS — R911 Solitary pulmonary nodule: Secondary | ICD-10-CM

## 2018-07-31 DIAGNOSIS — Z79899 Other long term (current) drug therapy: Secondary | ICD-10-CM

## 2018-07-31 DIAGNOSIS — M19041 Primary osteoarthritis, right hand: Secondary | ICD-10-CM

## 2018-07-31 DIAGNOSIS — M0579 Rheumatoid arthritis with rheumatoid factor of multiple sites without organ or systems involvement: Secondary | ICD-10-CM | POA: Diagnosis not present

## 2018-07-31 DIAGNOSIS — Z8619 Personal history of other infectious and parasitic diseases: Secondary | ICD-10-CM

## 2018-07-31 DIAGNOSIS — Z8659 Personal history of other mental and behavioral disorders: Secondary | ICD-10-CM | POA: Diagnosis not present

## 2018-07-31 DIAGNOSIS — M19042 Primary osteoarthritis, left hand: Secondary | ICD-10-CM

## 2018-07-31 NOTE — Patient Instructions (Addendum)
Standing Labs We placed an order today for your standing lab work.    Please come back and get your standing labs in December and every 3 months  TB gold, CBC, and CMP   We have open lab Monday through Friday from 8:30-11:30 AM and 1:30-4:00 PM  at the office of Dr. Shaili Deveshwar.   You may experience shorter wait times on Monday and Friday afternoons. The office is located at [address], [address], [place], [place] [address] No appointment is necessary.   Labs are drawn by [facility].  You may receive a bill from [facility] for your lab work. If you have any questions regarding directions or hours of operation,  please call [phone].   Just as a reminder please drink plenty of water prior to coming for your lab work. Thanks!   

## 2018-08-02 DIAGNOSIS — M545 Low back pain: Secondary | ICD-10-CM | POA: Diagnosis not present

## 2018-08-02 DIAGNOSIS — M25551 Pain in right hip: Secondary | ICD-10-CM | POA: Diagnosis not present

## 2018-09-12 ENCOUNTER — Other Ambulatory Visit: Payer: Self-pay | Admitting: Pharmacist

## 2018-09-12 ENCOUNTER — Other Ambulatory Visit: Payer: Self-pay | Admitting: *Deleted

## 2018-09-12 ENCOUNTER — Other Ambulatory Visit: Payer: Self-pay

## 2018-09-12 ENCOUNTER — Telehealth: Payer: Self-pay | Admitting: Rheumatology

## 2018-09-12 DIAGNOSIS — Z79899 Other long term (current) drug therapy: Secondary | ICD-10-CM

## 2018-09-12 MED ORDER — GOLIMUMAB 50 MG/0.5ML ~~LOC~~ SOAJ: SUBCUTANEOUS | 0 refills | Status: DC

## 2018-09-12 MED FILL — SIMPONI 50 MG/0.5ML SOAJ: 50 | 28 days supply | Qty: 1 | Fill #0

## 2018-09-12 NOTE — Telephone Encounter (Signed)
Patient advised prescription has already been sent to the pharmacy.

## 2018-09-12 NOTE — Telephone Encounter (Signed)
Refill request received via fax  Last Visit: 07/31/18 Next Visit: 01/01/19 Labs: 06/05/18 WNL TB Gold: 10/16/17 Neg   Left message to advise patient she is due to update labs.   Okay to refill 30 day supply per Dr. Estanislado Pandy

## 2018-09-12 NOTE — Telephone Encounter (Signed)
Patient states [facility] does not have Simponi on stock. Patient had labs today. Patient request you call in refill today, so pharmacy will order medication. Patient will not pick it up until you call with lab results.

## 2018-09-13 NOTE — Progress Notes (Signed)
Elevated creatinine noted.  Patient should avoid all NSAIDs.

## 2018-09-14 ENCOUNTER — Telehealth: Payer: Self-pay | Admitting: Rheumatology

## 2018-09-14 NOTE — Telephone Encounter (Signed)
Already have a Prior authorization approval for [facility] from 11/17/2017 to 11/16/2018.   Reference number:[id number] Phone number:[phone]

## 2018-09-14 NOTE — Telephone Encounter (Signed)
Patient left a voicemail stating she was returning your call.   

## 2018-09-15 LAB — COMPLETE METABOLIC PANEL WITH GFR
AG Ratio: 1.6 (calc) (ref 1.0–2.5)
ALT: 22 U/L (ref 6–29)
AST: 16 U/L (ref 10–35)
Albumin: 4 g/dL (ref 3.6–5.1)
Alkaline phosphatase (APISO): 65 U/L (ref 33–130)
BUN/Creatinine Ratio: 20 (calc) (ref 6–22)
BUN: 22 mg/dL (ref 7–25)
CO2: 26 mmol/L (ref 20–32)
CREATININE: 1.09 mg/dL — AB (ref 0.50–1.05)
Calcium: 8.8 mg/dL (ref 8.6–10.4)
Chloride: 110 mmol/L (ref 98–110)
GFR, Est African American: 65 mL/min/{1.73_m2} (ref >=60)
GFR, Est Non African American: 56 mL/min/{1.73_m2} — ABNORMAL LOW (ref >=60)
GLOBULIN: 2.5 g/dL (ref 1.9–3.7)
Glucose, Bld: 85 mg/dL (ref 65–99)
POTASSIUM: 4.6 mmol/L (ref 3.5–5.3)
SODIUM: 143 mmol/L (ref 135–146)
Total Bilirubin: 0.3 mg/dL (ref 0.2–1.2)
Total Protein: 6.5 g/dL (ref 6.1–8.1)

## 2018-09-15 LAB — CBC WITH DIFFERENTIAL/PLATELET
BASOS ABS: 30 {cells}/uL (ref 0–200)
BASOS PCT: 0.5 %
EOS ABS: 138 {cells}/uL (ref 15–500)
Eosinophils Relative: 2.3 %
HCT: 36.6 % (ref 35.0–45.0)
HEMOGLOBIN: 12 g/dL (ref 11.7–15.5)
LYMPHS ABS: 1890 {cells}/uL (ref 850–3900)
MCH: 30.2 pg (ref 27.0–33.0)
MCHC: 32.8 g/dL (ref 32.0–36.0)
MCV: 92 fL (ref 80.0–100.0)
MPV: 10.5 fL (ref 7.5–12.5)
Monocytes Relative: 8.3 %
Neutro Abs: 3444 cells/uL (ref 1500–7800)
Neutrophils Relative %: 57.4 %
Platelets: 255 10*3/uL (ref 140–400)
RBC: 3.98 10*6/uL (ref 3.80–5.10)
RDW: 12.4 % (ref 11.0–15.0)
TOTAL LYMPHOCYTE: 31.5 %
WBC mixed population: 498 cells/uL (ref 200–950)
WBC: 6 10*3/uL (ref 3.8–10.8)

## 2018-09-15 LAB — QUANTIFERON-TB GOLD PLUS
NIL: 0.01 [IU]/mL
QUANTIFERON-TB GOLD PLUS: NEGATIVE
TB1-NIL: 0 IU/mL
TB2-NIL: 0 IU/mL

## 2018-09-17 NOTE — Telephone Encounter (Signed)
Attempted to contact the patient and left message for patient to call the office.  

## 2018-09-17 NOTE — Progress Notes (Signed)
TB gold negative

## 2018-10-15 ENCOUNTER — Other Ambulatory Visit: Payer: Self-pay | Admitting: Pharmacist

## 2018-10-15 ENCOUNTER — Telehealth: Payer: Self-pay | Admitting: Rheumatology

## 2018-10-15 MED ORDER — GOLIMUMAB 50 MG/0.5ML ~~LOC~~ SOAJ: SUBCUTANEOUS | 0 refills | Status: DC

## 2018-10-15 MED FILL — SIMPONI 50 MG/0.5ML SOAJ: 50 | 28 days supply | Qty: 1 | Fill #0

## 2018-10-15 NOTE — Telephone Encounter (Signed)
Patient called requesting prescription refill of Simponi to be sent to [facility].  Patient states she would like to receive more than one injection.

## 2018-10-15 NOTE — Telephone Encounter (Signed)
Last Visit: 07/31/18 Next Visit: 01/01/19 Labs: 12/11/19Elevated creatinine noted. CBC WNL TB Gold: 09/13/19 Neg   Okay to refill per Dr Estanislado Pandy

## 2018-10-17 DIAGNOSIS — M5136 Other intervertebral disc degeneration, lumbar region: Secondary | ICD-10-CM | POA: Diagnosis not present

## 2018-10-17 DIAGNOSIS — M9902 Segmental and somatic dysfunction of thoracic region: Secondary | ICD-10-CM | POA: Diagnosis not present

## 2018-10-17 DIAGNOSIS — M9903 Segmental and somatic dysfunction of lumbar region: Secondary | ICD-10-CM | POA: Diagnosis not present

## 2018-10-17 DIAGNOSIS — M50322 Other cervical disc degeneration at C5-C6 level: Secondary | ICD-10-CM | POA: Diagnosis not present

## 2018-10-17 DIAGNOSIS — M9901 Segmental and somatic dysfunction of cervical region: Secondary | ICD-10-CM | POA: Diagnosis not present

## 2018-10-17 DIAGNOSIS — M5137 Other intervertebral disc degeneration, lumbosacral region: Secondary | ICD-10-CM | POA: Diagnosis not present

## 2018-10-23 DIAGNOSIS — N952 Postmenopausal atrophic vaginitis: Secondary | ICD-10-CM | POA: Diagnosis not present

## 2018-10-23 DIAGNOSIS — Z6828 Body mass index (BMI) 28.0-28.9, adult: Secondary | ICD-10-CM | POA: Diagnosis not present

## 2018-10-23 DIAGNOSIS — Z01419 Encounter for gynecological examination (general) (routine) without abnormal findings: Secondary | ICD-10-CM | POA: Diagnosis not present

## 2018-11-01 DIAGNOSIS — M5136 Other intervertebral disc degeneration, lumbar region: Secondary | ICD-10-CM | POA: Diagnosis not present

## 2018-11-01 DIAGNOSIS — M5137 Other intervertebral disc degeneration, lumbosacral region: Secondary | ICD-10-CM | POA: Diagnosis not present

## 2018-11-01 DIAGNOSIS — M50322 Other cervical disc degeneration at C5-C6 level: Secondary | ICD-10-CM | POA: Diagnosis not present

## 2018-11-01 DIAGNOSIS — M9903 Segmental and somatic dysfunction of lumbar region: Secondary | ICD-10-CM | POA: Diagnosis not present

## 2018-11-01 DIAGNOSIS — M9901 Segmental and somatic dysfunction of cervical region: Secondary | ICD-10-CM | POA: Diagnosis not present

## 2018-11-01 DIAGNOSIS — M9902 Segmental and somatic dysfunction of thoracic region: Secondary | ICD-10-CM | POA: Diagnosis not present

## 2018-11-14 DIAGNOSIS — M9903 Segmental and somatic dysfunction of lumbar region: Secondary | ICD-10-CM | POA: Diagnosis not present

## 2018-11-14 DIAGNOSIS — M5137 Other intervertebral disc degeneration, lumbosacral region: Secondary | ICD-10-CM | POA: Diagnosis not present

## 2018-11-14 DIAGNOSIS — M50322 Other cervical disc degeneration at C5-C6 level: Secondary | ICD-10-CM | POA: Diagnosis not present

## 2018-11-14 DIAGNOSIS — M5136 Other intervertebral disc degeneration, lumbar region: Secondary | ICD-10-CM | POA: Diagnosis not present

## 2018-11-14 DIAGNOSIS — M9902 Segmental and somatic dysfunction of thoracic region: Secondary | ICD-10-CM | POA: Diagnosis not present

## 2018-11-14 DIAGNOSIS — M9901 Segmental and somatic dysfunction of cervical region: Secondary | ICD-10-CM | POA: Diagnosis not present

## 2018-11-26 MED FILL — INTRAROSA 6.5 MG VAG INSERT: 6.5 | 28 days supply | Qty: 28 | Fill #0

## 2018-11-26 MED FILL — SIMPONI 50 MG/0.5ML SOAJ: 50 | 28 days supply | Qty: 1 | Fill #1 | Status: TO

## 2018-12-04 DIAGNOSIS — M5137 Other intervertebral disc degeneration, lumbosacral region: Secondary | ICD-10-CM | POA: Diagnosis not present

## 2018-12-04 DIAGNOSIS — M9903 Segmental and somatic dysfunction of lumbar region: Secondary | ICD-10-CM | POA: Diagnosis not present

## 2018-12-04 DIAGNOSIS — M9902 Segmental and somatic dysfunction of thoracic region: Secondary | ICD-10-CM | POA: Diagnosis not present

## 2018-12-04 DIAGNOSIS — M9901 Segmental and somatic dysfunction of cervical region: Secondary | ICD-10-CM | POA: Diagnosis not present

## 2018-12-04 DIAGNOSIS — M5136 Other intervertebral disc degeneration, lumbar region: Secondary | ICD-10-CM | POA: Diagnosis not present

## 2018-12-04 DIAGNOSIS — M50322 Other cervical disc degeneration at C5-C6 level: Secondary | ICD-10-CM | POA: Diagnosis not present

## 2018-12-06 ENCOUNTER — Ambulatory Visit (HOSPITAL_COMMUNITY)
Admission: EM | Admit: 2018-12-06 | Discharge: 2018-12-06 | Disposition: A | Discharge status: Home / Self Care | Payer: 59 | Attending: Family Medicine | Admitting: Family Medicine

## 2018-12-06 ENCOUNTER — Encounter (HOSPITAL_COMMUNITY): Payer: Self-pay

## 2018-12-06 DIAGNOSIS — M25561 Pain in right knee: Secondary | ICD-10-CM | POA: Diagnosis not present

## 2018-12-06 NOTE — ED Triage Notes (Signed)
Pt presents with right knee problem from unknown source; no fall or particuliar event.

## 2018-12-11 NOTE — ED Provider Notes (Signed)
[place]   [id number] 12/06/18 Arrival Time: 2376  ASSESSMENT & PLAN:  1. Anterior knee pain, right   Non-traumatic.  Prefers OTC NSAID in addition to wearing a knee sleeve. May eventually need an MRI if this does not improve within the next week or two.  Orders Placed This Encounter  Procedures  . Apply knee sleeve   Recommend: Follow-up Information    [name], DO.   Specialty:  Family Medicine Why:  As needed. Contact information: [address] [place] [address] [phone]        [facility].   Specialty:  Urgent Care Why:  As needed. Contact information: [place] [phone]         Reviewed expectations re: course of current medical issues. Questions answered. Outlined signs and symptoms indicating need for more acute intervention. Patient verbalized understanding. After Visit Summary given.  SUBJECTIVE: History from: patient. Sharon Hunter is a 58 y.o. female who reports intermittent sharp and transient pain of her right knee; describes walking with sudden onset of sensation "that my knee is going to give out on me and I feel a stabbing pain for a few seconds". No h/o similar. Some increase in frequency of symptoms over the past several days. Noticed first within the past week. Injury/trama: no. Aggravating factors: none; "just randomly occurs". Alleviating factors: none identified. Associated symptoms: none reported. Extremity sensation changes or weakness: none. Has take ibuprofen; unsure if this has helped with mild generalized soreness of knee.  History of similar: no.  Past Surgical History:  Procedure Laterality Date  . CHOLECYSTECTOMY  1999  . COLONOSCOPY  10/31/11  . EXPLORATORY LAPAROTOMY    . TONSILLECTOMY  1969  . WISDOM TOOTH EXTRACTION       ROS: As per HPI. All other systems negative.    OBJECTIVE:  Vitals:   12/06/18 1552  BP: (!) 151/74  Pulse: (!) 55  Resp: 20  Temp: (!) 97.4 F (36.3 C)  SpO2: 99%    General appearance: alert; no distress Extremities: . RLE: warm and well perfused; no significant tenderness to palpation over her knee; no obvious instability; without gross deformities; with no swelling; with no bruising; ROM: normal without reported discomfort CV: brisk extremity capillary refill of RLE Skin: warm and dry; no visible rashes Neurologic: gait normal; normal reflexes of RLE and LLE; normal sensation of RLE and LLE; normal strength of RLE and LLE Psychological: alert and cooperative; normal mood and affect  Allergies  Allergen Reactions  . Other Swelling    Contrast dye- head swelling per pt.  . Contrast Media [Iodinated Diagnostic Agents]     Past Medical History:  Diagnosis Date  . Anxiety   . Breast lump    stable-- gets annual mammogram  . 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 Hypertension   . IBS (irritable bowel syndrome) 08/01/2016  . Migraine   . Murmur 02/03/2011  . Ovarian failure   . Pulmonary nodule, left 2009   last CT Nov 2011-- due 08/2011  . Rheumatoid arthritis(714.0)    in remission , off meds as of Summer 2012   Social History   Socioeconomic History  . Marital status: Single    Spouse name: Not on file  . Number of children: 1  . Years of education: 33  . Highest education level: Not on file  Occupational History  .  Not on file  Social Needs  . Financial resource strain: Not on file  . Food insecurity:    Worry: Not on file    Inability: Not on file  . Transportation needs:    Medical: Not on file    Non-medical: Not on file  Tobacco Use  . Smoking status: Former Smoker    Last attempt to quit: 10/03/1990    Years since quitting: 28.2  . Smokeless tobacco: Never Used  Substance and Sexual Activity  . Alcohol use: Yes    Alcohol/week: 3.0 standard drinks    Types: 3 Standard drinks or  equivalent per week    Comment: Red wine 4 oz 4-5 times a week  . Drug use: No  . Sexual activity: Not Currently    Partners: Male  Lifestyle  . Physical activity:    Days per week: Not on file    Minutes per session: Not on file  . Stress: Not on file  Relationships  . Social connections:    Talks on phone: Not on file    Gets together: Not on file    Attends religious service: Not on file    Active member of club or organization: Not on file    Attends meetings of clubs or organizations: Not on file    Relationship status: Not on file  Other Topics Concern  . Not on file  Social History Narrative   Exercise-- jogs   Family History  Problem Relation Age of Onset  . Alcohol abuse Father   . Hyperlipidemia Father   . Heart disease Father   . Alcohol abuse Brother   . Diabetes Brother   . Hypertension Brother   . Arthritis Mother   . Uterine cancer Mother   . Hypertension Mother   . Heart disease Sister        cardiac stent  . Diabetes Sister   . Hyperlipidemia Sister   . Hypertension Sister   . Cancer Brother        lung  . Cancer Maternal Aunt        lung----non smoker   Past Surgical History:  Procedure Laterality Date  . CHOLECYSTECTOMY  1999  . COLONOSCOPY  10/31/11  . EXPLORATORY LAPAROTOMY    . TONSILLECTOMY  1969  . WISDOM TOOTH EXTRACTION        Vanessa Kick, MD 12/19/18 1004

## 2018-12-14 DIAGNOSIS — M25561 Pain in right knee: Secondary | ICD-10-CM | POA: Diagnosis not present

## 2018-12-31 ENCOUNTER — Telehealth: Payer: Self-pay | Admitting: Rheumatology

## 2018-12-31 DIAGNOSIS — M50322 Other cervical disc degeneration at C5-C6 level: Secondary | ICD-10-CM | POA: Diagnosis not present

## 2018-12-31 DIAGNOSIS — M9902 Segmental and somatic dysfunction of thoracic region: Secondary | ICD-10-CM | POA: Diagnosis not present

## 2018-12-31 DIAGNOSIS — M9903 Segmental and somatic dysfunction of lumbar region: Secondary | ICD-10-CM | POA: Diagnosis not present

## 2018-12-31 DIAGNOSIS — M5136 Other intervertebral disc degeneration, lumbar region: Secondary | ICD-10-CM | POA: Diagnosis not present

## 2018-12-31 DIAGNOSIS — M5137 Other intervertebral disc degeneration, lumbosacral region: Secondary | ICD-10-CM | POA: Diagnosis not present

## 2018-12-31 DIAGNOSIS — M9901 Segmental and somatic dysfunction of cervical region: Secondary | ICD-10-CM | POA: Diagnosis not present

## 2018-12-31 NOTE — Telephone Encounter (Signed)
Regarding your medications we recommend you continue them at this time as prescribed. In the event you do become sick please follow recommended procedure to hold medication until symptoms have resolved.  We understand concerns about increased risk of infection. If you do choose to stop your medications understand you are putting yourself at risk for a flare. Please call the office for any questions or acute issues. We are allowing some flexibly for labs and follow up visit. If you are having current respiratory symptoms do not come to the office, please utilize e-visits and your PCP for appropriate assessment and testing.  

## 2018-12-31 NOTE — Telephone Encounter (Signed)
Patient called to let Dr. Estanislado Pandy know that she is going to stop taking Simponi at this time due to the Coronavirus.  Patient has rescheduled her appointment for 03/27/19.  Patient states she will have her labwork when the office opens back up due to some of the [place] and [place] facilities are checking for COVID-19.

## 2019-01-01 ENCOUNTER — Other Ambulatory Visit: Payer: Self-pay

## 2019-01-01 ENCOUNTER — Ambulatory Visit (INDEPENDENT_AMBULATORY_CARE_PROVIDER_SITE_OTHER): Payer: 59 | Admitting: Pharmacist

## 2019-01-01 ENCOUNTER — Encounter: Payer: Self-pay | Admitting: Pharmacist

## 2019-01-01 ENCOUNTER — Ambulatory Visit: Payer: 59 | Admitting: Physician Assistant

## 2019-01-01 DIAGNOSIS — Z79899 Other long term (current) drug therapy: Secondary | ICD-10-CM

## 2019-01-01 NOTE — Progress Notes (Signed)
S: Patient is currently taking Simponi for rheumatoid arthritis. Patient is managed by [facility] for this.   Adherence: denies any missed doses recently but plans to hold the dose for now during the [place] outbreak. She reports that her rheumatologist is aware. She plans to restart it if she has a flare and will stay out of work while on it.   Efficacy: continues to work well for her.  Dosing:  SubQ: 50 mg once a month   Drug-drug interactions: none  Screening: TB test: completed yearly at work Hepatitis: completed  Monitoring: S/sx of infection: denies, using good infection prevention measures for now.  CBC: WNL S/sx of hypersensitivity: denies S/sx of malignancy: denies S/sx of heart failure: denies S/sx of autoimmune disorder: denies   O:     Lab Results  Component Value Date   WBC 6.0 09/12/2018   HGB 12.0 09/12/2018   HCT 36.6 09/12/2018   MCV 92.0 09/12/2018   PLT 255 09/12/2018      Chemistry      Component Value Date/Time   NA 143 09/12/2018 1417   NA 138 02/04/2016   K 4.6 09/12/2018 1417   CL 110 09/12/2018 1417   CO2 26 09/12/2018 1417   BUN 22 09/12/2018 1417   BUN 20 02/04/2016   CREATININE 1.09 (H) 09/12/2018 1417   GLU 88 02/04/2016      Component Value Date/Time   CALCIUM 8.8 09/12/2018 1417   ALKPHOS 58 03/27/2017 1341   AST 16 09/12/2018 1417   ALT 22 09/12/2018 1417   BILITOT 0.3 09/12/2018 1417       A/P: 1. Medication review: Patient tolerating Simponi well with no adverse effects. Reviewed the medication with the patient, including the following: Simponi, golimumab, is a TNF blocker. There is an increased risk of infection and malignancy with this medication. Do not give patients live vaccinations while they are on this medication. I see the note where Dr. Estanislado Pandy would like her to continue the medication but patient is adamant that she would like to hold it for now.  Patient to follow up with Dr. Estanislado Pandy as directed.   Christella Hartigan, PharmD, BCPS, BCACP, CPP Clinical Pharmacist Practitioner  [phone]

## 2019-01-17 DIAGNOSIS — M9901 Segmental and somatic dysfunction of cervical region: Secondary | ICD-10-CM | POA: Diagnosis not present

## 2019-01-17 DIAGNOSIS — M9902 Segmental and somatic dysfunction of thoracic region: Secondary | ICD-10-CM | POA: Diagnosis not present

## 2019-01-17 DIAGNOSIS — M5136 Other intervertebral disc degeneration, lumbar region: Secondary | ICD-10-CM | POA: Diagnosis not present

## 2019-01-17 DIAGNOSIS — M5137 Other intervertebral disc degeneration, lumbosacral region: Secondary | ICD-10-CM | POA: Diagnosis not present

## 2019-01-17 DIAGNOSIS — M9903 Segmental and somatic dysfunction of lumbar region: Secondary | ICD-10-CM | POA: Diagnosis not present

## 2019-01-17 DIAGNOSIS — M50322 Other cervical disc degeneration at C5-C6 level: Secondary | ICD-10-CM | POA: Diagnosis not present

## 2019-02-01 MED FILL — SIMPONI 50 MG/0.5ML SOAJ: 50 | 28 days supply | Qty: 1 | Fill #0

## 2019-02-04 DIAGNOSIS — M5136 Other intervertebral disc degeneration, lumbar region: Secondary | ICD-10-CM | POA: Diagnosis not present

## 2019-02-04 DIAGNOSIS — M9902 Segmental and somatic dysfunction of thoracic region: Secondary | ICD-10-CM | POA: Diagnosis not present

## 2019-02-04 DIAGNOSIS — M5137 Other intervertebral disc degeneration, lumbosacral region: Secondary | ICD-10-CM | POA: Diagnosis not present

## 2019-02-04 DIAGNOSIS — M9903 Segmental and somatic dysfunction of lumbar region: Secondary | ICD-10-CM | POA: Diagnosis not present

## 2019-02-04 DIAGNOSIS — M9901 Segmental and somatic dysfunction of cervical region: Secondary | ICD-10-CM | POA: Diagnosis not present

## 2019-02-04 DIAGNOSIS — M50322 Other cervical disc degeneration at C5-C6 level: Secondary | ICD-10-CM | POA: Diagnosis not present

## 2019-02-18 DIAGNOSIS — M9902 Segmental and somatic dysfunction of thoracic region: Secondary | ICD-10-CM | POA: Diagnosis not present

## 2019-02-18 DIAGNOSIS — M50322 Other cervical disc degeneration at C5-C6 level: Secondary | ICD-10-CM | POA: Diagnosis not present

## 2019-02-18 DIAGNOSIS — M9903 Segmental and somatic dysfunction of lumbar region: Secondary | ICD-10-CM | POA: Diagnosis not present

## 2019-02-18 DIAGNOSIS — M9901 Segmental and somatic dysfunction of cervical region: Secondary | ICD-10-CM | POA: Diagnosis not present

## 2019-02-18 DIAGNOSIS — M5137 Other intervertebral disc degeneration, lumbosacral region: Secondary | ICD-10-CM | POA: Diagnosis not present

## 2019-02-18 DIAGNOSIS — M5136 Other intervertebral disc degeneration, lumbar region: Secondary | ICD-10-CM | POA: Diagnosis not present

## 2019-03-11 ENCOUNTER — Other Ambulatory Visit: Payer: Self-pay

## 2019-03-11 DIAGNOSIS — M50322 Other cervical disc degeneration at C5-C6 level: Secondary | ICD-10-CM | POA: Diagnosis not present

## 2019-03-11 DIAGNOSIS — M9901 Segmental and somatic dysfunction of cervical region: Secondary | ICD-10-CM | POA: Diagnosis not present

## 2019-03-11 DIAGNOSIS — Z79899 Other long term (current) drug therapy: Secondary | ICD-10-CM | POA: Diagnosis not present

## 2019-03-11 DIAGNOSIS — M5137 Other intervertebral disc degeneration, lumbosacral region: Secondary | ICD-10-CM | POA: Diagnosis not present

## 2019-03-11 DIAGNOSIS — M9903 Segmental and somatic dysfunction of lumbar region: Secondary | ICD-10-CM | POA: Diagnosis not present

## 2019-03-11 DIAGNOSIS — M5136 Other intervertebral disc degeneration, lumbar region: Secondary | ICD-10-CM | POA: Diagnosis not present

## 2019-03-11 DIAGNOSIS — M9902 Segmental and somatic dysfunction of thoracic region: Secondary | ICD-10-CM | POA: Diagnosis not present

## 2019-03-12 LAB — CBC WITH DIFFERENTIAL/PLATELET
Absolute Monocytes: 511 cells/uL (ref 200–950)
Basophils Absolute: 42 cells/uL (ref 0–200)
Basophils Relative: 0.6 %
Eosinophils Absolute: 161 cells/uL (ref 15–500)
Eosinophils Relative: 2.3 %
HCT: 39.1 % (ref 35.0–45.0)
Hemoglobin: 13.4 g/dL (ref 11.7–15.5)
Lymphs Abs: 2254 cells/uL (ref 850–3900)
MCH: 31.2 pg (ref 27.0–33.0)
MCHC: 34.3 g/dL (ref 32.0–36.0)
MCV: 91.1 fL (ref 80.0–100.0)
MPV: 10.6 fL (ref 7.5–12.5)
Monocytes Relative: 7.3 %
Neutro Abs: 4032 cells/uL (ref 1500–7800)
Neutrophils Relative %: 57.6 %
Platelets: 285 10*3/uL (ref 140–400)
RBC: 4.29 10*6/uL (ref 3.80–5.10)
RDW: 12.4 % (ref 11.0–15.0)
Total Lymphocyte: 32.2 %
WBC: 7 10*3/uL (ref 3.8–10.8)

## 2019-03-12 LAB — COMPLETE METABOLIC PANEL WITH GFR
AG Ratio: 1.8 (calc) (ref 1.0–2.5)
ALT: 18 U/L (ref 6–29)
AST: 12 U/L (ref 10–35)
Albumin: 4.2 g/dL (ref 3.6–5.1)
Alkaline phosphatase (APISO): 54 U/L (ref 37–153)
BUN: 12 mg/dL (ref 7–25)
CO2: 26 mmol/L (ref 20–32)
Calcium: 9.4 mg/dL (ref 8.6–10.4)
Chloride: 105 mmol/L (ref 98–110)
Creat: 0.94 mg/dL (ref 0.50–1.05)
GFR, Est African American: 78 mL/min/{1.73_m2} (ref >=60)
GFR, Est Non African American: 67 mL/min/{1.73_m2} (ref >=60)
Globulin: 2.3 g/dL (calc) (ref 1.9–3.7)
Glucose, Bld: 104 mg/dL — ABNORMAL HIGH (ref 65–99)
Potassium: 4.7 mmol/L (ref 3.5–5.3)
Sodium: 140 mmol/L (ref 135–146)
Total Bilirubin: 0.5 mg/dL (ref 0.2–1.2)
Total Protein: 6.5 g/dL (ref 6.1–8.1)

## 2019-03-13 NOTE — Progress Notes (Deleted)
Office Visit Note  Patient: Sharon Hunter             Date of Birth: Aug 29, 1961           MRN: [id number]             PCP: [name], DO Referring: [name], * Visit Date: 03/27/2019 Occupation: @GUAROCC @  Subjective:  No chief complaint on file.  She held Simponi during covered outbreak unsure if she is restarted.  Simponi subcu 50 mg every 28 days.  Last TB: Negative on 09/12/2018 and will monitor yearly.  Most recent CBC/CMP within normal limits on 03/11/2019 and will monitor every 3 months.  Standing orders are in place. Recommend annual influenza, Pneumovax 23, Prevnar 13, and Shingrix as indicated.  Recommend yearly skin exams.  History of Present Illness: Sharon Hunter is a 58 y.o. female ***   Activities of Daily Living:  Patient reports morning stiffness for *** {minute/hour:19697}.   Patient {ACTIONS;DENIES/REPORTS:21021675::"Denies"} nocturnal pain.  Difficulty dressing/grooming: {ACTIONS;DENIES/REPORTS:21021675::"Denies"} Difficulty climbing stairs: {ACTIONS;DENIES/REPORTS:21021675::"Denies"} Difficulty getting out of chair: {ACTIONS;DENIES/REPORTS:21021675::"Denies"} Difficulty using hands for taps, buttons, cutlery, and/or writing: {ACTIONS;DENIES/REPORTS:21021675::"Denies"}  No Rheumatology ROS completed.   PMFS History:  Patient Active Problem List   Diagnosis Date Noted  . History of anxiety 03/27/2017  . History of hypertension 03/27/2017  . History of bundle branch block 03/27/2017  . Primary osteoarthritis of both hands 12/22/2016  . History of shingles 08/02/2016  . High risk medication use 08/02/2016  . Hypertension 08/01/2016  . IBS (irritable bowel syndrome) 08/01/2016  . Anxiety 08/01/2016  . 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 Rate-related bundle branch block 01/30/2015  . Palpitations 01/30/2015  . Pulmonary nodule 07/04/2011  . Murmur 02/03/2011  . Rheumatoid arthritis ([place]) 02/03/2011  . Hair loss  02/03/2011  . Fatigue 02/03/2011    Past Medical History:  Diagnosis Date  . Anxiety   . Breast lump    stable-- gets annual mammogram  . 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 Hypertension   . IBS (irritable bowel syndrome) 08/01/2016  . Migraine   . Murmur 02/03/2011  . Ovarian failure   . Pulmonary nodule, left 2009   last CT Nov 2011-- due 08/2011  . Rheumatoid arthritis(714.0)    in remission , off meds as of Summer 2012    Family History  Problem Relation Age of Onset  . Alcohol abuse Father   . Hyperlipidemia Father   . Heart disease Father   . Alcohol abuse Brother   . Diabetes Brother   . Hypertension Brother   . Arthritis Mother   . Uterine cancer Mother   . Hypertension Mother   . Heart disease Sister        cardiac stent  . Diabetes Sister   . Hyperlipidemia Sister   . Hypertension Sister   . Cancer Brother        lung  . Cancer Maternal Aunt        lung----non smoker   Past Surgical History:  Procedure Laterality Date  . CHOLECYSTECTOMY  1999  . COLONOSCOPY  10/31/11  . EXPLORATORY LAPAROTOMY    . TONSILLECTOMY  1969  . WISDOM TOOTH EXTRACTION     Social History   Social History Narrative   Exercise-- jogs   Immunization History  Administered Date(s) Administered  . Influenza Split 07/03/2013  . Influenza-Unspecified 07/03/2014, 07/04/2015  . Tdap 12/01/2009     Objective: Vital Signs: LMP 10/03/2005  Physical Exam   Musculoskeletal Exam: ***  CDAI Exam: CDAI Score: Not documented Patient Global Assessment: Not documented; Provider Global Assessment: Not documented Swollen: Not documented; Tender: Not documented Joint Exam   Not documented   There is currently no information documented on the homunculus. Go to the Rheumatology activity and complete the homunculus joint exam.  Investigation: No additional findings.  Imaging: No results found.  Recent Labs: Lab Results  Component Value Date   WBC 7.0 03/11/2019    HGB 13.4 03/11/2019   PLT 285 03/11/2019   NA 140 03/11/2019   K 4.7 03/11/2019   CL 105 03/11/2019   CO2 26 03/11/2019   GLUCOSE 104 (H) 03/11/2019   BUN 12 03/11/2019   CREATININE 0.94 03/11/2019   BILITOT 0.5 03/11/2019   ALKPHOS 58 03/27/2017   AST 12 03/11/2019   ALT 18 03/11/2019   PROT 6.5 03/11/2019   ALBUMIN 4.3 03/27/2017   CALCIUM 9.4 03/11/2019   GFRAA 78 03/11/2019   QFTBGOLDPLUS NEGATIVE 09/12/2018    Speciality Comments: No specialty comments available.  Procedures:  No procedures performed Allergies: Other and Contrast media [iodinated diagnostic agents]   Assessment / Plan:     Visit Diagnoses: No diagnosis found.   Orders: No orders of the defined types were placed in this encounter.  No orders of the defined types were placed in this encounter.   Face-to-face time spent with patient was *** minutes. Greater than 50% of time was spent in counseling and coordination of care.  Follow-Up Instructions: No follow-ups on file.   Sharon Neas, PA-C  Note - This record has been created using Dragon software.  Chart creation errors have been sought, but may not always  have been located. Such creation errors do not reflect on  the standard of medical care.

## 2019-03-26 ENCOUNTER — Other Ambulatory Visit: Payer: Self-pay

## 2019-03-26 ENCOUNTER — Encounter: Payer: Self-pay | Admitting: Physician Assistant

## 2019-03-26 ENCOUNTER — Ambulatory Visit (INDEPENDENT_AMBULATORY_CARE_PROVIDER_SITE_OTHER): Payer: 59 | Admitting: Physician Assistant

## 2019-03-26 VITALS — BP 129/73 | HR 61 | Resp 13 | Ht 66.5 in | Wt 188.8 lb

## 2019-03-26 DIAGNOSIS — Z8679 Personal history of other diseases of the circulatory system: Secondary | ICD-10-CM

## 2019-03-26 DIAGNOSIS — M19041 Primary osteoarthritis, right hand: Secondary | ICD-10-CM | POA: Diagnosis not present

## 2019-03-26 DIAGNOSIS — Z8639 Personal history of other endocrine, nutritional and metabolic disease: Secondary | ICD-10-CM | POA: Diagnosis not present

## 2019-03-26 DIAGNOSIS — M19042 Primary osteoarthritis, left hand: Secondary | ICD-10-CM

## 2019-03-26 DIAGNOSIS — Z79899 Other long term (current) drug therapy: Secondary | ICD-10-CM | POA: Diagnosis not present

## 2019-03-26 DIAGNOSIS — R5383 Other fatigue: Secondary | ICD-10-CM | POA: Diagnosis not present

## 2019-03-26 DIAGNOSIS — Z8659 Personal history of other mental and behavioral disorders: Secondary | ICD-10-CM

## 2019-03-26 DIAGNOSIS — Z8619 Personal history of other infectious and parasitic diseases: Secondary | ICD-10-CM

## 2019-03-26 DIAGNOSIS — R911 Solitary pulmonary nodule: Secondary | ICD-10-CM | POA: Diagnosis not present

## 2019-03-26 DIAGNOSIS — M0579 Rheumatoid arthritis with rheumatoid factor of multiple sites without organ or systems involvement: Secondary | ICD-10-CM

## 2019-03-26 MED ORDER — SIMPONI 50 MG/0.5ML ~~LOC~~ SOAJ: SUBCUTANEOUS | 0 refills | Status: DC

## 2019-03-26 NOTE — Progress Notes (Signed)
Office Visit Note  Patient: Sharon Hunter             Date of Birth: Jul 04, 1961           MRN: [id number]             PCP: [name], DO Referring: [name], * Visit Date: 03/26/2019 Occupation: @GUAROCC @  Subjective:  Pain in both shoulder joints   History of Present Illness: XAVIER FOURNIER is a 58 y.o. female with history of seropositive rheumatoid arthritis and osteoarthritis.  She is prescribed Simponi 50 mg subcutaneous injections every 30 days.  She held 2 doses of Simponi due to the concern for coronavirus.  She recently resumed Simponi subcutaneous injections.  She states that while off of Simponi she was having increased pain in bilateral shoulder joints.  She states that she is having some increased arthralgias but denies any joint swelling.  She denies any increased joint stiffness.  She states that she takes Motrin as needed for pain relief and when she is experiencing increased discomfort.  She reports she has noticed some increased fatigue while off of Simponi but overall she sleeps well after taking melatonin and a half of unisom.  She denies any other joint pain or joint swelling.   Activities of Daily Living:  Patient reports morning stiffness for 0  minutes.   Patient Denies nocturnal pain.  Difficulty dressing/grooming: Denies Difficulty climbing stairs: Denies Difficulty getting out of chair: Denies Difficulty using hands for taps, buttons, cutlery, and/or writing: Denies  Review of Systems  Constitutional: Positive for fatigue.  HENT: Positive for nose dryness. Negative for mouth sores and mouth dryness.   Eyes: Positive for dryness. Negative for pain and visual disturbance.  Respiratory: Negative for cough, hemoptysis, shortness of breath and difficulty breathing.   Cardiovascular: Negative for chest pain, palpitations, hypertension and swelling in legs/feet.  Gastrointestinal: Positive for constipation and diarrhea. Negative for blood in  stool.  Endocrine: Negative for increased urination.  Genitourinary: Negative for painful urination.  Musculoskeletal: Positive for arthralgias and joint pain. Negative for joint swelling, myalgias, muscle weakness, morning stiffness, muscle tenderness and myalgias.  Skin: Negative for color change, pallor, rash, hair loss, nodules/bumps, skin tightness, ulcers and sensitivity to sunlight.  Allergic/Immunologic: Negative for susceptible to infections.  Neurological: Negative for dizziness, numbness, headaches and weakness.  Hematological: Negative for swollen glands.  Psychiatric/Behavioral: Positive for depressed mood and sleep disturbance. The patient is nervous/anxious.     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   Rate-related bundle branch block 01/30/2015   Palpitations 01/30/2015   Pulmonary nodule 07/04/2011   Murmur 02/03/2011   Rheumatoid arthritis ([place]) 02/03/2011   Hair loss 02/03/2011   Fatigue 02/03/2011    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   Hypertension    IBS (irritable bowel syndrome) 08/01/2016   Migraine    Murmur 02/03/2011   Ovarian failure    Pulmonary nodule,  left 2009   last CT Nov 2011-- due 08/2011   Rheumatoid arthritis(714.0)    in remission , off meds as of Summer 2012    Family History  Problem Relation Age of Onset   Alcohol abuse Father    Hyperlipidemia Father    Heart disease Father    Alcohol abuse  Brother    Diabetes Brother    Hypertension Brother    Arthritis Mother    Uterine cancer Mother    Hypertension Mother    Heart disease Sister        cardiac stent   Diabetes Sister    Hyperlipidemia Sister    Hypertension Sister    Cancer Brother        lung   Cancer Maternal Aunt        lung----non smoker   Past Surgical History:  Procedure Laterality Date   CHOLECYSTECTOMY  1999   COLONOSCOPY  10/31/11   EXPLORATORY LAPAROTOMY     TONSILLECTOMY  1969   WISDOM TOOTH EXTRACTION     Social History   Social History Narrative   Exercise-- jogs   Immunization History  Administered Date(s) Administered   Influenza Split 07/03/2013   Influenza-Unspecified 07/03/2014, 07/04/2015   Tdap 12/01/2009     Objective: Vital Signs: BP 129/73 (BP Location: Right Arm, Patient Position: Sitting, Cuff Size: Normal)    Pulse 61    Resp 13    Ht 5' 6.5" (1.689 m)    Wt 188 lb 12.8 oz (85.6 kg)    LMP 10/03/2005    BMI 30.02 kg/m    Physical Exam Vitals signs and nursing note reviewed.  Constitutional:      Appearance: She is well-developed.  HENT:     Head: Normocephalic and atraumatic.  Eyes:     Conjunctiva/sclera: Conjunctivae normal.  Neck:     Musculoskeletal: Normal range of motion.  Cardiovascular:     Rate and Rhythm: Normal rate and regular rhythm.     Heart sounds: Normal heart sounds.  Pulmonary:     Effort: Pulmonary effort is normal.     Breath sounds: Normal breath sounds.  Abdominal:     General: Bowel sounds are normal.     Palpations: Abdomen is soft.  Lymphadenopathy:     Cervical: No cervical adenopathy.  Skin:    General: Skin is warm and dry.     Capillary Refill: Capillary refill takes less than 2 seconds.  Neurological:     Mental Status: She is alert and oriented to person, place, and time.  Psychiatric:        Behavior: Behavior normal.      Musculoskeletal Exam: C-spine, thoracic spine, lumbar spine good range of motion.   No midline spinal tenderness.  SI joint tenderness.  Shoulder joints, elbow joints, joints, MCPs, PIPs, DIPs good range of motion no synovitis.  She has complete laceration bilaterally.  Hip joints have some limitation in range of motion with discomfort.  Knee joints, ankle joints, MCPs, PIPs and DIPs good range of motion no synovitis.  No warmth or effusion bilateral knee joints.  No tenderness or swelling of ankle joints.  No tenderness over trochanteric bursa bilaterally  CDAI Exam: CDAI Score: 0.2  Patient Global: 1 mm; Provider Global: 1 mm Swollen: 0 ; Tender: 0  Joint Exam   No joint exam has been documented for this visit   There is currently no information documented on the homunculus. Go to the Rheumatology activity and complete the  homunculus joint exam.  Investigation: No additional findings.  Imaging: No results found.  Recent Labs: Lab Results  Component Value Date   WBC 7.0 03/11/2019   HGB 13.4 03/11/2019   PLT 285 03/11/2019   NA 140 03/11/2019   K 4.7 03/11/2019   CL 105 03/11/2019   CO2 26 03/11/2019   GLUCOSE 104 (H) 03/11/2019   BUN 12 03/11/2019   CREATININE 0.94 03/11/2019   BILITOT 0.5 03/11/2019   ALKPHOS 58 03/27/2017   AST 12 03/11/2019   ALT 18 03/11/2019   PROT 6.5 03/11/2019   ALBUMIN 4.3 03/27/2017   CALCIUM 9.4 03/11/2019   GFRAA 78 03/11/2019   QFTBGOLDPLUS NEGATIVE 09/12/2018    Speciality Comments: No specialty comments available.  Procedures:  No procedures performed Allergies: Other and Contrast media [iodinated diagnostic agents]   Assessment / Plan:     Visit Diagnoses: Rheumatoid arthritis involving multiple sites with positive rheumatoid factor ([place]) - She has no active synovitis on exam.  She is prescribed Simponi 50 mg subcutaneous injections every 30 days.  She had 2 doses of Symphony during the COVID-19 pandemic, but she recently restarted.  While holding severity she was experiencing increased pain in bilateral shoulder  joints.  She declined x-rays of her shoulder joints today.  She states the pain is slowly been improving.  She has good range of motion of both shoulders on exam.  She was taking Motrin as needed for pain relief.  She was having increased generalized arthralgias but no joint swelling.  She has no joint tenderness or swelling on exam today.  She will continue injecting Symphony 50 mg subcutaneously every 30 days.  Refill sent to the pharmacy today.  She is advised to notify us if she develops increased joint pain or joint swelling.  She will follow-up in the office in 5 months.  High risk medication use - She held Simponi for 2 months at the beginning of the covid-19 pandemic.  She recently resumed Simponi sq injections every 30 days. Last TB: Negative on 09/12/2018 and will monitor yearly.  Most recent CBC/CMP within normal limits on 03/11/2019 and will monitor every 3 months.  Standing orders are in place.   Primary osteoarthritis of both hands: She has no hand pain or swelling at this time.  She is complete fist formation bilaterally.  She has no discomfort at this time.  Joint protection and muscle strengthening were discussed.  Other fatigue - Chronic.  She sleeps well at night after taking Melatonin and unisom at bedtime.   History of hypertension -BP was 129/73 today.   History of anxiety   Pulmonary nodule   History of vitamin D deficiency   History of bundle branch block   History of shingles   Orders: No orders of the defined types were placed in this encounter.  No orders of the defined types were placed in this encounter.     Follow-Up Instructions: Return in about 5 months (around 08/26/2019) for Osteoarthritis, Rheumatoid arthritis.   Ofilia Neas, PA-C  Note - This record has been created using Dragon software.  Chart creation errors have been sought, but may not always  have been located. Such creation errors do not reflect on  the standard of medical care.

## 2019-03-27 ENCOUNTER — Other Ambulatory Visit: Payer: Self-pay | Admitting: Pharmacist

## 2019-03-27 ENCOUNTER — Telehealth: Payer: Self-pay | Admitting: Pharmacist

## 2019-03-27 ENCOUNTER — Ambulatory Visit: Payer: Self-pay | Admitting: Physician Assistant

## 2019-03-27 MED ORDER — SIMPONI 50 MG/0.5ML ~~LOC~~ SOAJ: SUBCUTANEOUS | 0 refills | Status: DC

## 2019-03-27 NOTE — Telephone Encounter (Signed)
Received a Prior Authorization request from [facility] for [place]. Authorization has been submitted to patient's insurance via Cover My Meds. Will update once we receive a response.  PA Case ID: [id number]

## 2019-03-28 DIAGNOSIS — M9903 Segmental and somatic dysfunction of lumbar region: Secondary | ICD-10-CM | POA: Diagnosis not present

## 2019-03-28 DIAGNOSIS — M9902 Segmental and somatic dysfunction of thoracic region: Secondary | ICD-10-CM | POA: Diagnosis not present

## 2019-03-28 DIAGNOSIS — M5137 Other intervertebral disc degeneration, lumbosacral region: Secondary | ICD-10-CM | POA: Diagnosis not present

## 2019-03-28 DIAGNOSIS — M5136 Other intervertebral disc degeneration, lumbar region: Secondary | ICD-10-CM | POA: Diagnosis not present

## 2019-03-28 DIAGNOSIS — M50322 Other cervical disc degeneration at C5-C6 level: Secondary | ICD-10-CM | POA: Diagnosis not present

## 2019-03-28 DIAGNOSIS — M9901 Segmental and somatic dysfunction of cervical region: Secondary | ICD-10-CM | POA: Diagnosis not present

## 2019-04-01 MED FILL — SIMPONI 50 MG/0.5ML SOAJ: 50 | 30 days supply | Qty: 1 | Fill #0

## 2019-04-01 NOTE — Telephone Encounter (Signed)
Received a fax from [facility] regarding a prior authorization for [facility]. Authorization has been APPROVED from 04/14/2019 to 04/12/2020.   Will send document to scan center.  Authorization # [id number] Phone # [phone]

## 2019-04-18 DIAGNOSIS — M9901 Segmental and somatic dysfunction of cervical region: Secondary | ICD-10-CM | POA: Diagnosis not present

## 2019-04-18 DIAGNOSIS — M9903 Segmental and somatic dysfunction of lumbar region: Secondary | ICD-10-CM | POA: Diagnosis not present

## 2019-04-18 DIAGNOSIS — M5137 Other intervertebral disc degeneration, lumbosacral region: Secondary | ICD-10-CM | POA: Diagnosis not present

## 2019-04-18 DIAGNOSIS — M50322 Other cervical disc degeneration at C5-C6 level: Secondary | ICD-10-CM | POA: Diagnosis not present

## 2019-04-18 DIAGNOSIS — M5136 Other intervertebral disc degeneration, lumbar region: Secondary | ICD-10-CM | POA: Diagnosis not present

## 2019-04-18 DIAGNOSIS — M9902 Segmental and somatic dysfunction of thoracic region: Secondary | ICD-10-CM | POA: Diagnosis not present

## 2019-04-25 ENCOUNTER — Telehealth: Payer: Self-pay | Admitting: Rheumatology

## 2019-04-25 NOTE — Telephone Encounter (Signed)
Patient lmom stating she has a medical question, and would like someone to call her back. Patient did not state what that question was. Patient's mobile # [phone], or work # [phone] (ask for [employer])

## 2019-04-25 NOTE — Telephone Encounter (Signed)
Attempted to contact patient and left message for patient to call the office.  

## 2019-05-13 DIAGNOSIS — M50322 Other cervical disc degeneration at C5-C6 level: Secondary | ICD-10-CM | POA: Diagnosis not present

## 2019-05-13 DIAGNOSIS — M9902 Segmental and somatic dysfunction of thoracic region: Secondary | ICD-10-CM | POA: Diagnosis not present

## 2019-05-13 DIAGNOSIS — M5137 Other intervertebral disc degeneration, lumbosacral region: Secondary | ICD-10-CM | POA: Diagnosis not present

## 2019-05-13 DIAGNOSIS — M9903 Segmental and somatic dysfunction of lumbar region: Secondary | ICD-10-CM | POA: Diagnosis not present

## 2019-05-13 DIAGNOSIS — M5136 Other intervertebral disc degeneration, lumbar region: Secondary | ICD-10-CM | POA: Diagnosis not present

## 2019-05-13 DIAGNOSIS — M9901 Segmental and somatic dysfunction of cervical region: Secondary | ICD-10-CM | POA: Diagnosis not present

## 2019-05-22 MED FILL — SIMPONI 50 MG/0.5ML SOAJ: 50 | 30 days supply | Qty: 1 | Fill #1

## 2019-06-03 DIAGNOSIS — M5137 Other intervertebral disc degeneration, lumbosacral region: Secondary | ICD-10-CM | POA: Diagnosis not present

## 2019-06-03 DIAGNOSIS — M9903 Segmental and somatic dysfunction of lumbar region: Secondary | ICD-10-CM | POA: Diagnosis not present

## 2019-06-03 DIAGNOSIS — M50322 Other cervical disc degeneration at C5-C6 level: Secondary | ICD-10-CM | POA: Diagnosis not present

## 2019-06-03 DIAGNOSIS — M9901 Segmental and somatic dysfunction of cervical region: Secondary | ICD-10-CM | POA: Diagnosis not present

## 2019-06-03 DIAGNOSIS — M5136 Other intervertebral disc degeneration, lumbar region: Secondary | ICD-10-CM | POA: Diagnosis not present

## 2019-06-03 DIAGNOSIS — M9902 Segmental and somatic dysfunction of thoracic region: Secondary | ICD-10-CM | POA: Diagnosis not present

## 2019-06-25 DIAGNOSIS — M9903 Segmental and somatic dysfunction of lumbar region: Secondary | ICD-10-CM | POA: Diagnosis not present

## 2019-06-25 DIAGNOSIS — M9902 Segmental and somatic dysfunction of thoracic region: Secondary | ICD-10-CM | POA: Diagnosis not present

## 2019-06-25 DIAGNOSIS — M50322 Other cervical disc degeneration at C5-C6 level: Secondary | ICD-10-CM | POA: Diagnosis not present

## 2019-06-25 DIAGNOSIS — M5136 Other intervertebral disc degeneration, lumbar region: Secondary | ICD-10-CM | POA: Diagnosis not present

## 2019-06-25 DIAGNOSIS — M5137 Other intervertebral disc degeneration, lumbosacral region: Secondary | ICD-10-CM | POA: Diagnosis not present

## 2019-06-25 DIAGNOSIS — M9901 Segmental and somatic dysfunction of cervical region: Secondary | ICD-10-CM | POA: Diagnosis not present

## 2019-06-28 MED FILL — SIMPONI 50 MG/0.5ML SOAJ: 50 | 30 days supply | Qty: 1 | Fill #2

## 2019-06-28 MED FILL — INTRAROSA 6.5 MG VAG INSERT: 6.5 | 28 days supply | Qty: 28 | Fill #1

## 2019-07-10 ENCOUNTER — Other Ambulatory Visit: Payer: Self-pay | Admitting: Pediatrics

## 2019-07-10 ENCOUNTER — Other Ambulatory Visit: Payer: Self-pay | Admitting: Radiology

## 2019-07-10 DIAGNOSIS — Z1231 Encounter for screening mammogram for malignant neoplasm of breast: Secondary | ICD-10-CM

## 2019-07-22 DIAGNOSIS — M5136 Other intervertebral disc degeneration, lumbar region: Secondary | ICD-10-CM | POA: Diagnosis not present

## 2019-07-22 DIAGNOSIS — M50322 Other cervical disc degeneration at C5-C6 level: Secondary | ICD-10-CM | POA: Diagnosis not present

## 2019-07-22 DIAGNOSIS — M9903 Segmental and somatic dysfunction of lumbar region: Secondary | ICD-10-CM | POA: Diagnosis not present

## 2019-07-22 DIAGNOSIS — M9902 Segmental and somatic dysfunction of thoracic region: Secondary | ICD-10-CM | POA: Diagnosis not present

## 2019-07-22 DIAGNOSIS — M5137 Other intervertebral disc degeneration, lumbosacral region: Secondary | ICD-10-CM | POA: Diagnosis not present

## 2019-07-22 DIAGNOSIS — M9901 Segmental and somatic dysfunction of cervical region: Secondary | ICD-10-CM | POA: Diagnosis not present

## 2019-07-24 ENCOUNTER — Other Ambulatory Visit: Payer: Self-pay | Admitting: *Deleted

## 2019-07-24 ENCOUNTER — Telehealth: Payer: Self-pay | Admitting: Rheumatology

## 2019-07-24 DIAGNOSIS — Z79899 Other long term (current) drug therapy: Secondary | ICD-10-CM

## 2019-07-24 NOTE — Telephone Encounter (Signed)
Patient request refill on Simponi for 90 day supply sent to [facility]. Patient had labs today 07/24/2019. Patient states meds refilled last time for 30 days, and wants to make sure it is for 90 days this time.

## 2019-07-25 ENCOUNTER — Other Ambulatory Visit: Payer: Self-pay | Admitting: Pharmacist

## 2019-07-25 LAB — COMPLETE METABOLIC PANEL WITH GFR
AG Ratio: 1.8 (calc) (ref 1.0–2.5)
ALT: 15 U/L (ref 6–29)
AST: 10 U/L (ref 10–35)
Albumin: 4.1 g/dL (ref 3.6–5.1)
Alkaline phosphatase (APISO): 52 U/L (ref 37–153)
BUN: 23 mg/dL (ref 7–25)
CO2: 24 mmol/L (ref 20–32)
Calcium: 9.2 mg/dL (ref 8.6–10.4)
Chloride: 106 mmol/L (ref 98–110)
Creat: 0.92 mg/dL (ref 0.50–1.05)
GFR, Est African American: 80 mL/min/{1.73_m2} (ref >=60)
GFR, Est Non African American: 69 mL/min/{1.73_m2} (ref >=60)
Globulin: 2.3 g/dL (calc) (ref 1.9–3.7)
Glucose, Bld: 82 mg/dL (ref 65–99)
Potassium: 4.1 mmol/L (ref 3.5–5.3)
Sodium: 138 mmol/L (ref 135–146)
Total Bilirubin: 0.3 mg/dL (ref 0.2–1.2)
Total Protein: 6.4 g/dL (ref 6.1–8.1)

## 2019-07-25 LAB — CBC WITH DIFFERENTIAL/PLATELET
Absolute Monocytes: 548 cells/uL (ref 200–950)
Basophils Absolute: 37 cells/uL (ref 0–200)
Basophils Relative: 0.5 %
Eosinophils Absolute: 170 cells/uL (ref 15–500)
Eosinophils Relative: 2.3 %
HCT: 38.3 % (ref 35.0–45.0)
Hemoglobin: 12.7 g/dL (ref 11.7–15.5)
Lymphs Abs: 2427 cells/uL (ref 850–3900)
MCH: 31 pg (ref 27.0–33.0)
MCHC: 33.2 g/dL (ref 32.0–36.0)
MCV: 93.4 fL (ref 80.0–100.0)
MPV: 10.7 fL (ref 7.5–12.5)
Monocytes Relative: 7.4 %
Neutro Abs: 4218 cells/uL (ref 1500–7800)
Neutrophils Relative %: 57 %
Platelets: 274 10*3/uL (ref 140–400)
RBC: 4.1 10*6/uL (ref 3.80–5.10)
RDW: 12.8 % (ref 11.0–15.0)
Total Lymphocyte: 32.8 %
WBC: 7.4 10*3/uL (ref 3.8–10.8)

## 2019-07-25 MED ORDER — SIMPONI 50 MG/0.5ML ~~LOC~~ SOAJ: SUBCUTANEOUS | 0 refills | Status: DC

## 2019-07-25 MED FILL — SIMPONI 50 MG/0.5ML SOAJ: 50 | 30 days supply | Qty: 1 | Fill #0

## 2019-07-25 NOTE — Telephone Encounter (Signed)
Last visit: 03/26/19 Next Visit: 08/27/19 Labs: 07/24/19 WNL TB Gold: 09/12/18 Neg  Okay to refill per Dr. Estanislado Pandy   Left message to advise patient prescription was sent to the pharmacy for a 90 day supply as it was the last time.

## 2019-08-12 MED FILL — SIMPONI 50 MG/0.5ML SOAJ: 50 | 30 days supply | Qty: 1 | Fill #0

## 2019-08-13 NOTE — Progress Notes (Signed)
Virtual Visit via Telephone Note  I connected with Sharon Hunter on 08/27/19 at  3:00 PM EST by telephone and verified that I am speaking with the correct person using two identifiers.  Location: Patient: Home  Provider: Clinic  This service was conducted via virtual visit.   The patient was located at home. I was located in my office.  Consent was obtained prior to the virtual visit and is aware of possible charges through their insurance for this visit.  The patient is an established patient.  Dr. Estanislado Pandy, MD conducted the virtual visit and Hazel Sams, PA-C acted as scribe during the service.  Office staff helped with scheduling follow up visits after the service was conducted.     I discussed the limitations, risks, security and privacy concerns of performing an evaluation and management service by telephone and the availability of in person appointments. I also discussed with the patient that there may be a patient responsible charge related to this service. The patient expressed understanding and agreed to proceed.  CC: Medication monitoring  History of Present Illness: Patient is a 58 year old female with a past medical history of seropositive rheumatoid arthritis and osteoarthritis.  She is on Simponi 50 mg sq injections every 28 days. She has been spacing the doses of Simponi due to the concern for covid-19.  She works as a Marine scientist in the hospital.  She has not had any recent infections. She denies any recent RA flares.  She was previously having discomfort in both shoulder joints but that has improved.  Her level of fatigue has also improved.  She denies any joint swelling at this time.  She has been having increased eye dryness bilaterally, especially in the left eye.  She will be following up with her ophthalmologist for further evaluation.    Review of Systems  Constitutional: Negative for fever and malaise/fatigue.  Eyes: Negative for photophobia, pain, discharge and redness.      +Dry eyes  Respiratory: Negative for cough, shortness of breath and wheezing.   Cardiovascular: Negative for chest pain and palpitations.  Gastrointestinal: Negative for blood in stool, constipation and diarrhea.  Genitourinary: Negative for dysuria.  Musculoskeletal: Negative for back pain, joint pain, myalgias and neck pain.  Skin: Negative for rash.  Neurological: Negative for dizziness and headaches.  Psychiatric/Behavioral: Negative for depression. The patient is not nervous/anxious and does not have insomnia.       Observations/Objective: Physical Exam  Constitutional: She is oriented to person, place, and time.  Neurological: She is alert and oriented to person, place, and time.  Psychiatric: Mood, memory, affect and judgment normal.   Patient reports morning stiffness for 0 minutes.   Patient denies nocturnal pain.  Difficulty dressing/grooming: Denies Difficulty climbing stairs: Denies Difficulty getting out of chair: Denies Difficulty using hands for taps, buttons, cutlery, and/or writing: Denies   Assessment and Plan: Visit Diagnoses: Rheumatoid arthritis involving multiple sites with positive rheumatoid factor ([place]) - She has not had any recent rheumatoid arthritis flares.  She was previously having increased discomfort in bilateral shoulder joints but her discomfort is improving.  She has no other joint pain or joint swelling at this time.  She has no morning stiffness. She has been spacing Simponi 50 mg sq injections due to the concern for covid-19.  She is aware that she is to hold Simponi if she develops any signs or symptoms of an infection.  She is apprehensive to receive the first covid-19 vaccination.  She was advised to notify us if she develops increased joint pain or joint swelling.  She will continue on Simponi 50 mg sq injections every 28 days.  She will follow up in 4 months.   High risk medication use - Simponi 50 mg every 28 days.Last TB gold negative  07/04/19.  Most recent CBC/CMP within normal limits on 07/24/2019 and will monitor every 3 months.  She will be due to update lab work in January.  Standing orders are in place.   Primary osteoarthritis of both hands: She experiences intermittent pain in bilateral CMC joints.  She has no joint swelling.  She has no difficulty with ADLs.  Joint protection and muscle strengthening were discussed.   Other fatigue - Chronic.  She sleeps well at night after taking Melatonin and unisom at bedtime. Her level of fatigue has improved.   Other medical conditions are listed as follows:   History of hypertension  History of anxiety   Pulmonary nodule   History of vitamin D deficiency   History of bundle branch block     Follow Up Instructions: She will follow up in 4 months.   I discussed the assessment and treatment plan with the patient. The patient was provided an opportunity to ask questions and all were answered. The patient agreed with the plan and demonstrated an understanding of the instructions.   The patient was advised to call back or seek an in-person evaluation if the symptoms worsen or if the condition fails to improve as anticipated.  I provided 15 minutes of non-face-to-face time during this encounter.   Bo Merino, MD  Scribed by-  Hazel Sams, PA-C

## 2019-08-16 ENCOUNTER — Ambulatory Visit
Admission: RE | Admit: 2019-08-16 | Discharge: 2019-08-16 | Disposition: A | Discharge status: Home / Self Care | Payer: 59 | Source: Ambulatory Visit | Attending: Radiology | Admitting: Radiology

## 2019-08-16 ENCOUNTER — Other Ambulatory Visit: Payer: Self-pay

## 2019-08-16 DIAGNOSIS — Z1231 Encounter for screening mammogram for malignant neoplasm of breast: Secondary | ICD-10-CM

## 2019-08-22 DIAGNOSIS — M50322 Other cervical disc degeneration at C5-C6 level: Secondary | ICD-10-CM | POA: Diagnosis not present

## 2019-08-22 DIAGNOSIS — M5136 Other intervertebral disc degeneration, lumbar region: Secondary | ICD-10-CM | POA: Diagnosis not present

## 2019-08-22 DIAGNOSIS — M9901 Segmental and somatic dysfunction of cervical region: Secondary | ICD-10-CM | POA: Diagnosis not present

## 2019-08-22 DIAGNOSIS — M9902 Segmental and somatic dysfunction of thoracic region: Secondary | ICD-10-CM | POA: Diagnosis not present

## 2019-08-22 DIAGNOSIS — M5137 Other intervertebral disc degeneration, lumbosacral region: Secondary | ICD-10-CM | POA: Diagnosis not present

## 2019-08-22 DIAGNOSIS — M9903 Segmental and somatic dysfunction of lumbar region: Secondary | ICD-10-CM | POA: Diagnosis not present

## 2019-08-27 ENCOUNTER — Encounter: Payer: Self-pay | Admitting: Rheumatology

## 2019-08-27 ENCOUNTER — Telehealth (INDEPENDENT_AMBULATORY_CARE_PROVIDER_SITE_OTHER): Payer: 59 | Admitting: Rheumatology

## 2019-08-27 ENCOUNTER — Other Ambulatory Visit: Payer: Self-pay

## 2019-08-27 DIAGNOSIS — R911 Solitary pulmonary nodule: Secondary | ICD-10-CM

## 2019-08-27 DIAGNOSIS — Z8639 Personal history of other endocrine, nutritional and metabolic disease: Secondary | ICD-10-CM

## 2019-08-27 DIAGNOSIS — M0579 Rheumatoid arthritis with rheumatoid factor of multiple sites without organ or systems involvement: Secondary | ICD-10-CM | POA: Diagnosis not present

## 2019-08-27 DIAGNOSIS — Z79899 Other long term (current) drug therapy: Secondary | ICD-10-CM | POA: Diagnosis not present

## 2019-08-27 DIAGNOSIS — Z8679 Personal history of other diseases of the circulatory system: Secondary | ICD-10-CM | POA: Diagnosis not present

## 2019-08-27 DIAGNOSIS — M19042 Primary osteoarthritis, left hand: Secondary | ICD-10-CM

## 2019-08-27 DIAGNOSIS — M19041 Primary osteoarthritis, right hand: Secondary | ICD-10-CM | POA: Diagnosis not present

## 2019-08-27 DIAGNOSIS — R5383 Other fatigue: Secondary | ICD-10-CM | POA: Diagnosis not present

## 2019-08-27 DIAGNOSIS — Z8619 Personal history of other infectious and parasitic diseases: Secondary | ICD-10-CM

## 2019-08-27 DIAGNOSIS — Z8659 Personal history of other mental and behavioral disorders: Secondary | ICD-10-CM

## 2019-09-04 MED FILL — SIMPONI 50 MG/0.5ML SOAJ: 50 | 30 days supply | Qty: 1 | Fill #1

## 2019-09-07 IMAGING — MG DIGITAL SCREENING BILATERAL MAMMOGRAM WITH CAD
4 series · 4 of 4 positions shown · non-contrast
Comparison: Previous exam(s).

CLINICAL DATA: Screening.

EXAM:
DIGITAL SCREENING BILATERAL MAMMOGRAM WITH CAD

[L MLO]
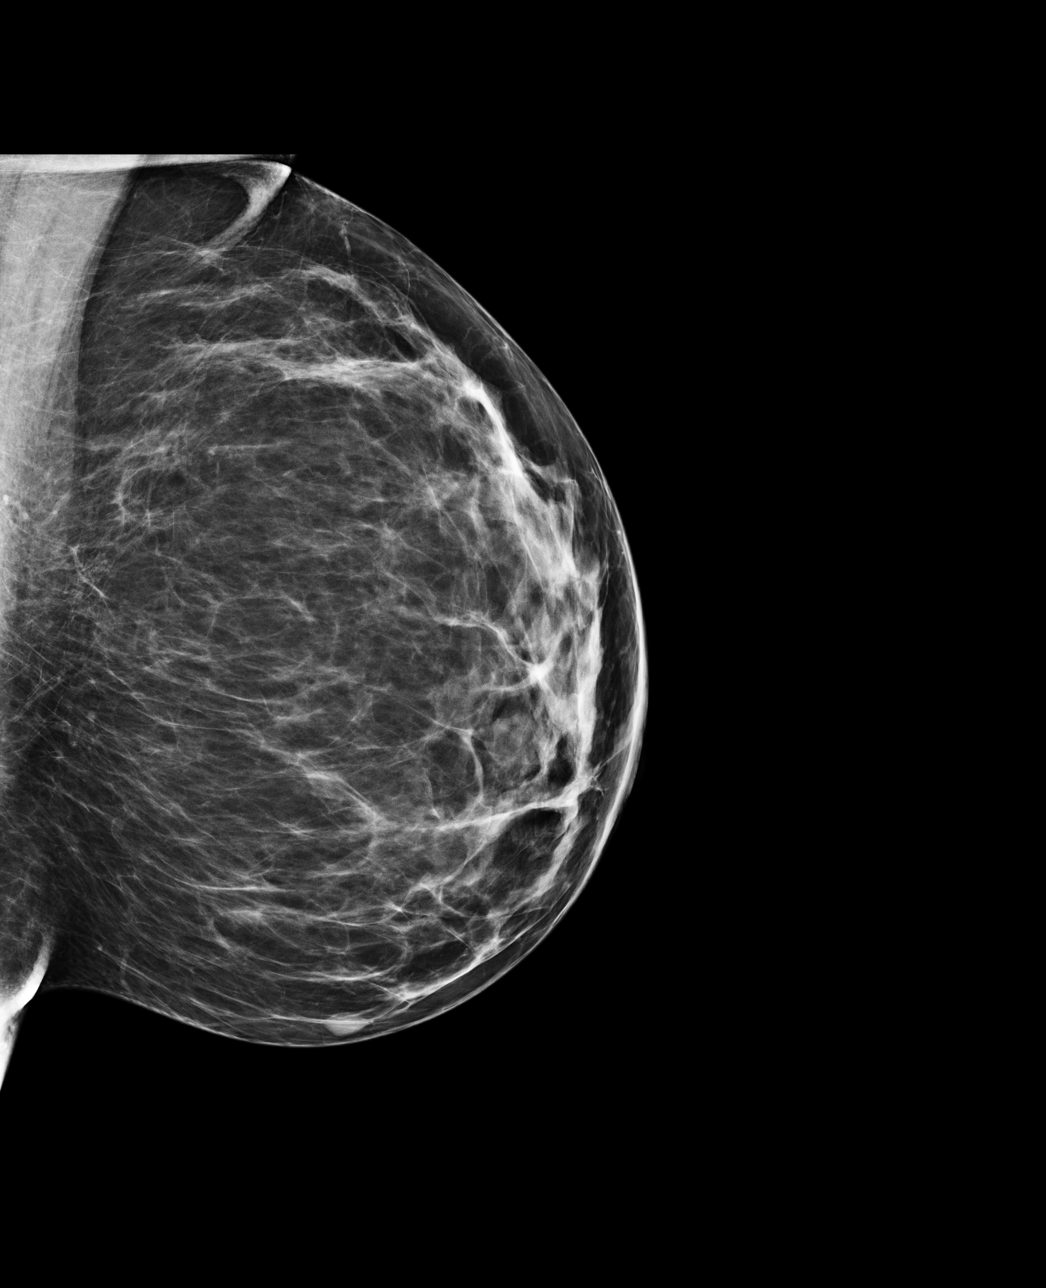

[R MLO]
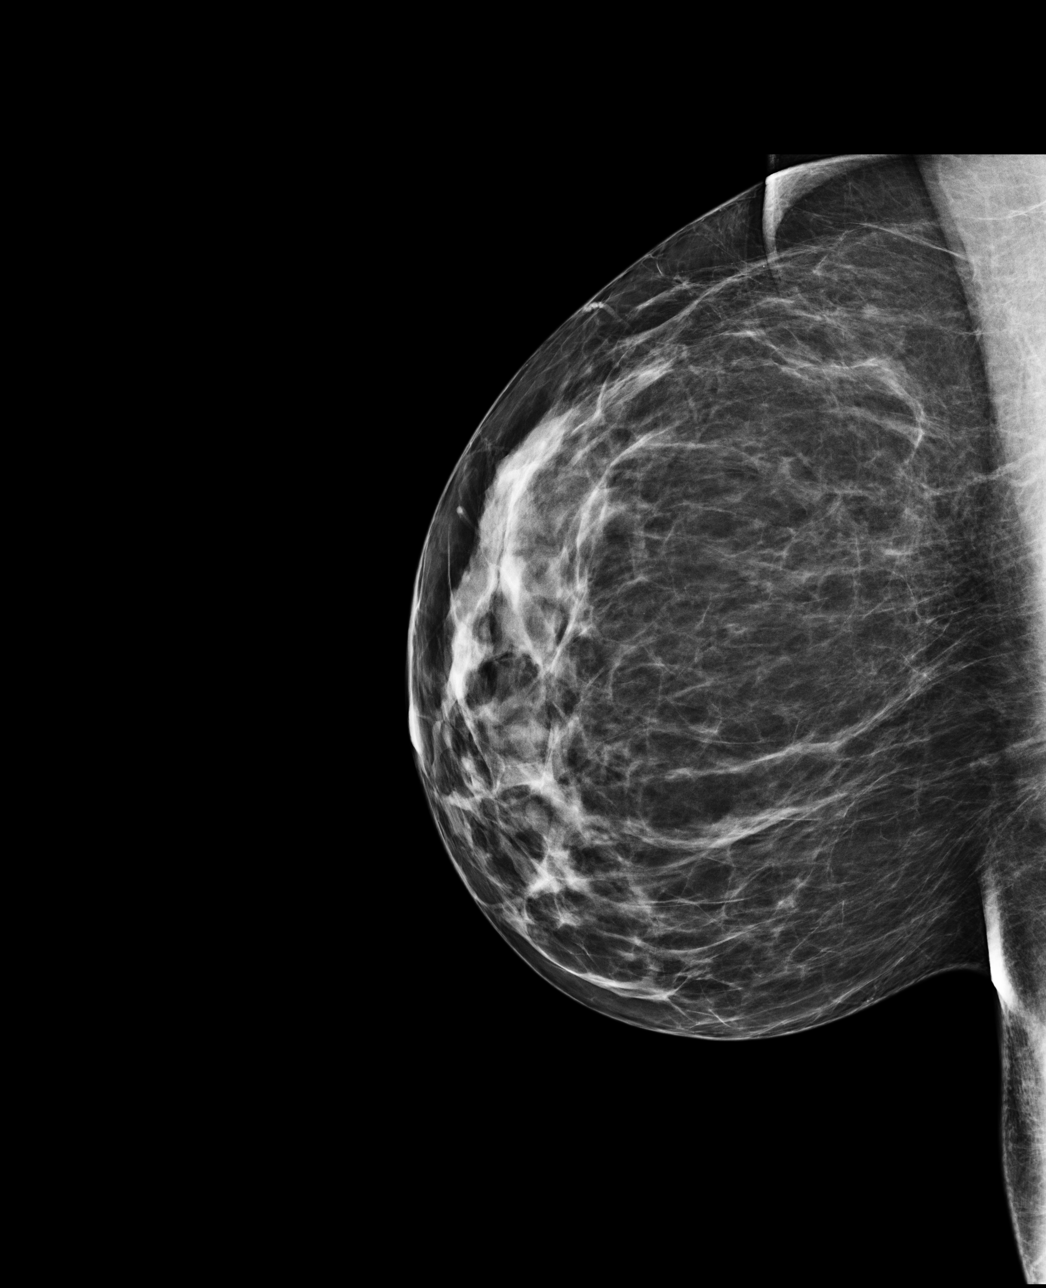

[L CC]
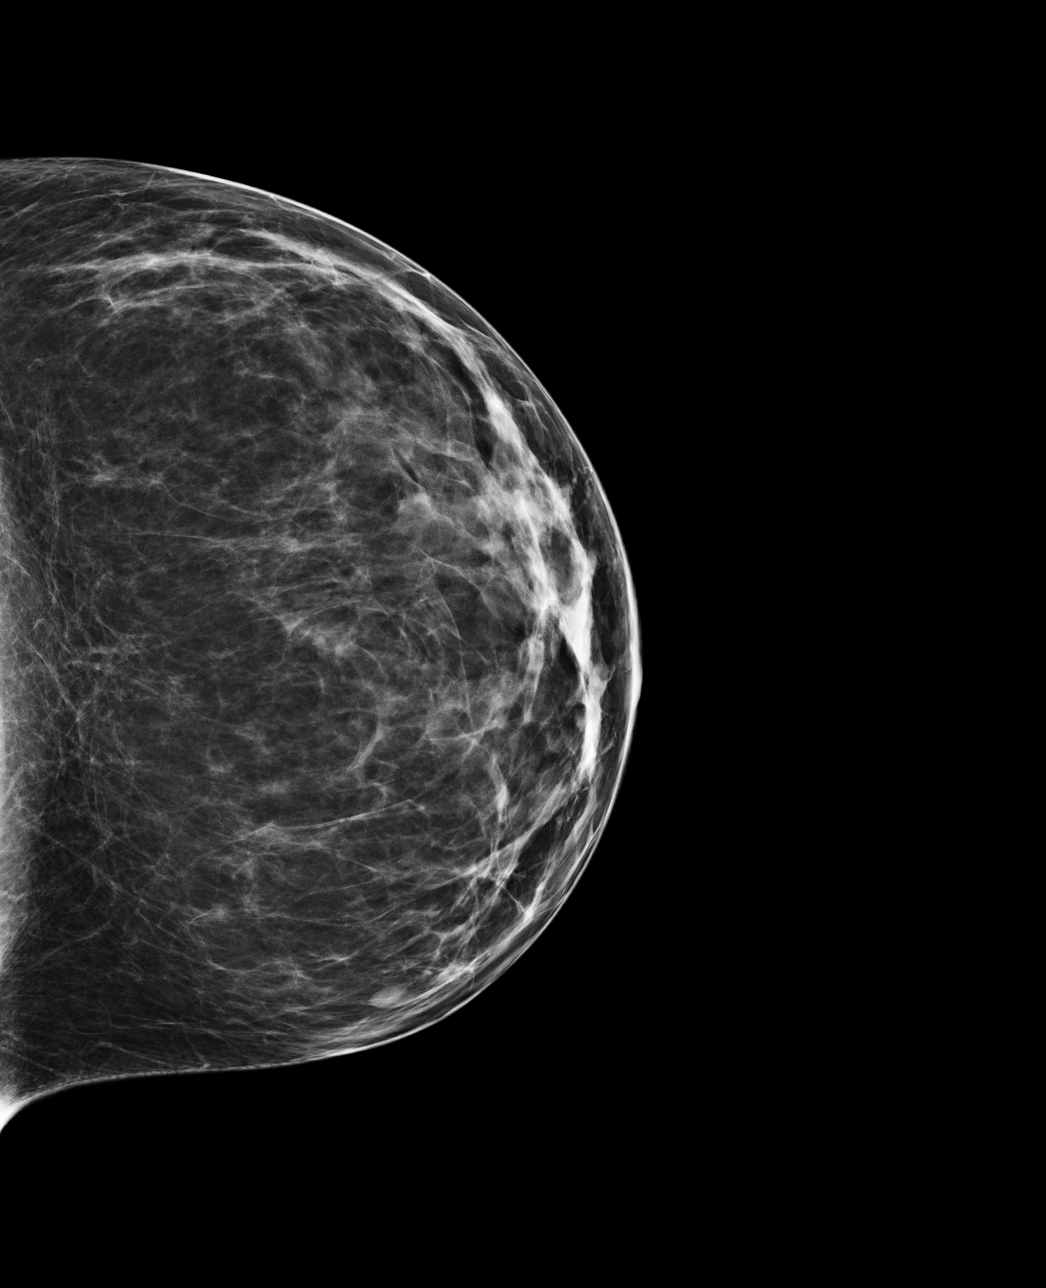

[R CC]
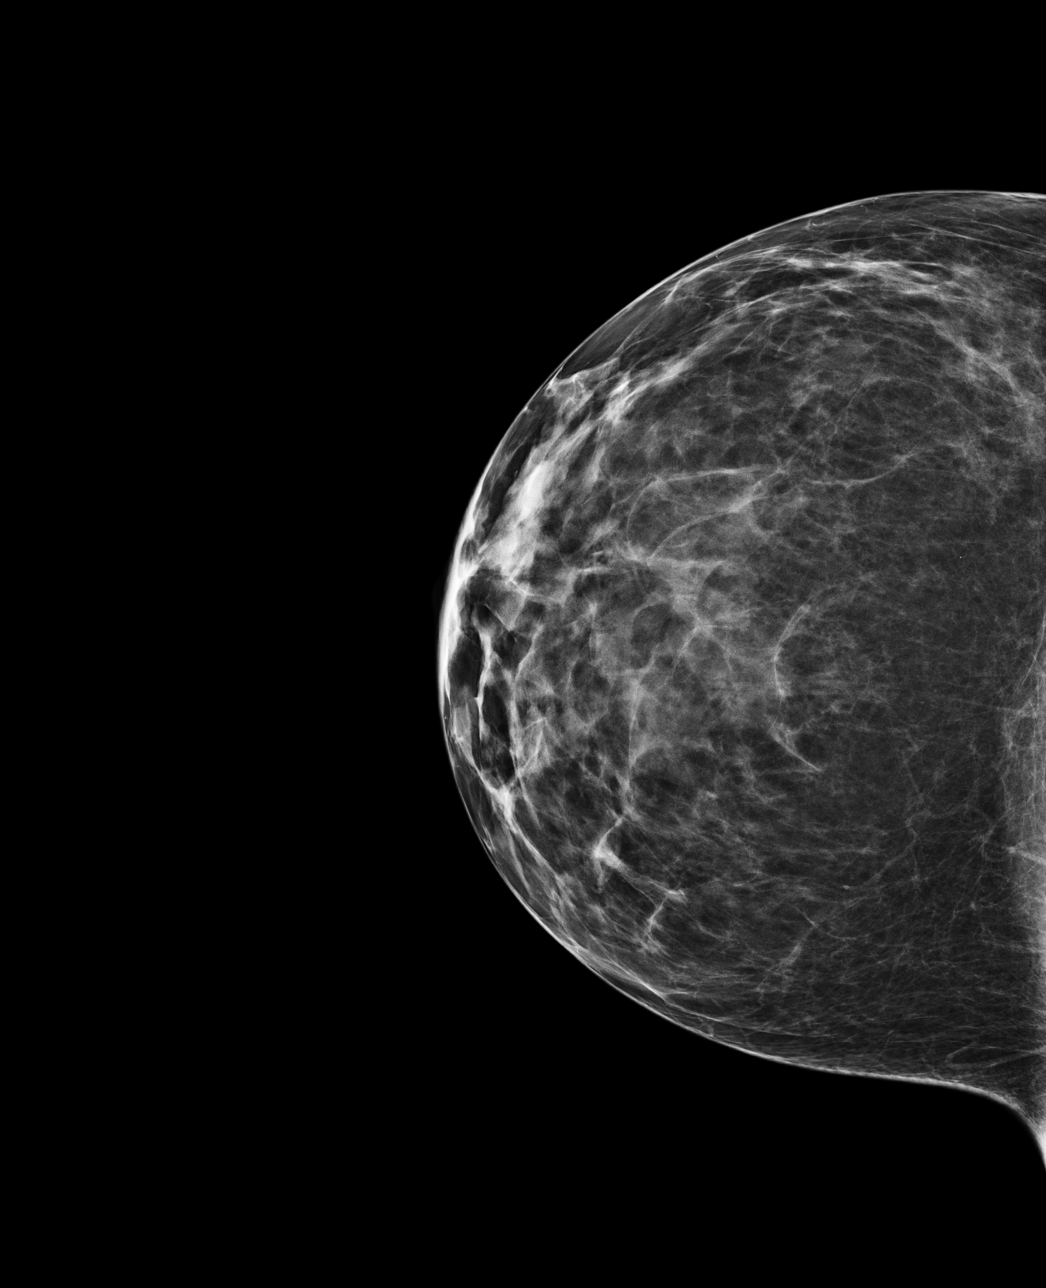

[4 of 4 positions shown; findings below may reference images not displayed]

ACR Breast Density Category b: There are scattered areas of
fibroglandular density.
FINDINGS: There are no findings suspicious for malignancy. Images were
processed with CAD.
IMPRESSION: No mammographic evidence of malignancy. A result letter of this
screening mammogram will be mailed directly to the patient.

RECOMMENDATION:
Screening mammogram in one year. (Code:AS-G-LCT)

BI-RADS CATEGORY  1: Negative.

## 2019-09-16 DIAGNOSIS — M9903 Segmental and somatic dysfunction of lumbar region: Secondary | ICD-10-CM | POA: Diagnosis not present

## 2019-09-16 DIAGNOSIS — M50322 Other cervical disc degeneration at C5-C6 level: Secondary | ICD-10-CM | POA: Diagnosis not present

## 2019-09-16 DIAGNOSIS — M9901 Segmental and somatic dysfunction of cervical region: Secondary | ICD-10-CM | POA: Diagnosis not present

## 2019-09-16 DIAGNOSIS — H524 Presbyopia: Secondary | ICD-10-CM | POA: Diagnosis not present

## 2019-09-16 DIAGNOSIS — M9902 Segmental and somatic dysfunction of thoracic region: Secondary | ICD-10-CM | POA: Diagnosis not present

## 2019-09-16 DIAGNOSIS — M5137 Other intervertebral disc degeneration, lumbosacral region: Secondary | ICD-10-CM | POA: Diagnosis not present

## 2019-09-16 DIAGNOSIS — M5136 Other intervertebral disc degeneration, lumbar region: Secondary | ICD-10-CM | POA: Diagnosis not present

## 2019-10-07 MED FILL — SIMPONI 50 MG/0.5ML SOAJ: 50 | 30 days supply | Qty: 1 | Fill #0

## 2019-10-08 DIAGNOSIS — M9902 Segmental and somatic dysfunction of thoracic region: Secondary | ICD-10-CM | POA: Diagnosis not present

## 2019-10-08 DIAGNOSIS — M5136 Other intervertebral disc degeneration, lumbar region: Secondary | ICD-10-CM | POA: Diagnosis not present

## 2019-10-08 DIAGNOSIS — M9901 Segmental and somatic dysfunction of cervical region: Secondary | ICD-10-CM | POA: Diagnosis not present

## 2019-10-08 DIAGNOSIS — M5137 Other intervertebral disc degeneration, lumbosacral region: Secondary | ICD-10-CM | POA: Diagnosis not present

## 2019-10-08 DIAGNOSIS — M9903 Segmental and somatic dysfunction of lumbar region: Secondary | ICD-10-CM | POA: Diagnosis not present

## 2019-10-08 DIAGNOSIS — M50322 Other cervical disc degeneration at C5-C6 level: Secondary | ICD-10-CM | POA: Diagnosis not present

## 2019-10-21 ENCOUNTER — Encounter: Payer: Self-pay | Admitting: Rheumatology

## 2019-10-21 NOTE — Telephone Encounter (Signed)
Please advise patient to postpone her next Simponi injection until 3-4 weeks after symptom onset.  Please advise patient to not resume injections if her symptoms persist longer than that timeframe.

## 2019-10-24 ENCOUNTER — Encounter: Payer: Self-pay | Admitting: Medical

## 2019-10-24 ENCOUNTER — Ambulatory Visit (INDEPENDENT_AMBULATORY_CARE_PROVIDER_SITE_OTHER): Payer: 59 | Admitting: Medical

## 2019-10-24 ENCOUNTER — Other Ambulatory Visit: Payer: Self-pay

## 2019-10-24 VITALS — BP 129/87 | HR 73 | Temp 98.7°F | Ht 67.0 in | Wt 189.0 lb

## 2019-10-24 DIAGNOSIS — R0600 Dyspnea, unspecified: Secondary | ICD-10-CM

## 2019-10-24 DIAGNOSIS — J3489 Other specified disorders of nose and nasal sinuses: Secondary | ICD-10-CM

## 2019-10-24 DIAGNOSIS — U071 COVID-19: Secondary | ICD-10-CM

## 2019-10-24 MED ORDER — ALBUTEROL SULFATE HFA 108 (90 BASE) MCG/ACT IN AERS: 2.0000 | INHALATION_SPRAY | Freq: Four times a day (QID) | RESPIRATORY_TRACT | 0 refills | Status: DC | PRN

## 2019-10-24 MED ORDER — AZITHROMYCIN 250 MG PO TABS: ORAL_TABLET | ORAL | 0 refills | Status: DC

## 2019-10-24 MED FILL — ALBUTEROL SULFATE HFA 108 (: 108 (90 BAS | 25 days supply | Qty: 18 | Fill #0

## 2019-10-24 MED FILL — AZITHROMYCIN 250 MG TABLET: 250 | 5 days supply | Qty: 6 | Fill #0

## 2019-10-24 NOTE — Progress Notes (Signed)
Subjective:    Patient ID: Sharon Hunter, female    DOB: August 19, 1961, 59 y.o.   MRN: [id number]  HPI  Virtual Visit via Telephone Note  I connected with Sharon Hunter on 10/24/19 at 11:20 AM EST by telephone and verified that I am speaking with the correct person using two identifiers.  Location: Patient: home Provider: office   I discussed the limitations, risks, security and privacy concerns of performing an evaluation and management service by telephone and the availability of in person appointments. I also discussed with the patient that there may be a patient responsible charge related to this service. The patient expressed understanding and agreed to proceed.     Follow Up Instructions:    I discussed the assessment and treatment plan with the patient. The patient was provided an opportunity to ask questions and all were answered. The patient agreed with the plan and demonstrated an understanding of the instructions.   The patient was advised to call back or seek an in-person evaluation if the symptoms worsen or if the condition fails to improve as anticipated.  I provided 20  minutes of non-face-to-face time during this encounter.   Tyress Loden, PA-C   hpi.   Pt states recent covid + dx. She states loss of taste/smell, chills, sob and fever. Pt states fever was early on t max 100.3 Lat night again recurrent fever 100.   Pt states staying well hydrated. Very minimal cough. Feel some light headed/dizzy.   When blows nose will get just little mucus. Pt is irrigating her nose with saline rinse.  Transient faint slight chest pain when she takes a deep breath. Only last second.  Pt is on biologic med rx'd by rheumatologist. Last injection was 3 weeks.  Pt states some sob when she ambulates. Describes sometimes just 5-6 steps will cause sob. Pt states when talking of phones persons have told her she sounds sob.  Faint sinus pressure.  Pt lost 4 lb.   Pt covid  test run on sat. Came back + on Sunday.  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      Review of Systems  Constitutional: Positive for fatigue. Negative for chills.  HENT: Positive for sinus pressure and sinus pain. Negative for congestion and ear pain.   Respiratory: Positive for cough and shortness of breath. Negative for chest tightness and wheezing.   Cardiovascular: Negative for chest pain and palpitations.  Gastrointestinal: Negative for abdominal pain, blood in stool and constipation.        Objective:   Physical Exam   General- no acute distress, pleasant, alert and oriented. Normal speech.       Assessment & Plan:  Recent covid infection with some recent sinus pressure, dyspnea and bronchitis type signs/symptoms.  Will rx azithromycin antibiotic, advise delsym for cough and albuterol inhaler for sob/wheezing.   Advised pt to get 02 saturation monitor. Explained benefit in terms of evaluation day to day basis. If she does get and severe low 02 sate then ED evaluation.  If mild decrease in 02 sat % then could arrange evaluation/appoitment at respiratory clinic tomorrow.  Asked her to update me by my chart later today or tomorrow morning on 02 sat readings.  Follow up in 7 days or as needed.Marland Kitchen

## 2019-10-24 NOTE — Patient Instructions (Addendum)
  Recent covid infection with some recent sinus pressure, dyspnea and bronchitis type signs/symptoms.  Will rx azithromycin antibiotic, advise delsym for cough and albuterol inhaler for sob/wheezing.   Advised pt to get 02 saturation monitor. Explained benefit in terms of evaluation day to day basis. If she does get and severe low 02 sate then ED evaluation.  If mild decrease in 02 sat % then could arrange evaluation/appoitment at respiratory clinic tomorrow.  Asked her to update me by my chart later today or tomorrow morning on 02 sat readings.  Follow up in 7 days or as needed.Marland Kitchen

## 2019-10-25 ENCOUNTER — Encounter: Payer: Self-pay | Admitting: Medical

## 2019-10-25 MED FILL — XIIDRA 5 % SOLN: 5 | 90 days supply | Qty: 180 | Fill #0

## 2019-10-28 ENCOUNTER — Other Ambulatory Visit: Payer: Self-pay

## 2019-10-28 ENCOUNTER — Ambulatory Visit (INDEPENDENT_AMBULATORY_CARE_PROVIDER_SITE_OTHER): Payer: 59 | Admitting: Family Medicine

## 2019-10-28 VITALS — BP 140/100 | HR 93 | Temp 98.5°F | Ht 67.0 in | Wt 193.0 lb

## 2019-10-28 DIAGNOSIS — J40 Bronchitis, not specified as acute or chronic: Secondary | ICD-10-CM | POA: Diagnosis not present

## 2019-10-28 DIAGNOSIS — U071 COVID-19: Secondary | ICD-10-CM

## 2019-10-28 DIAGNOSIS — R0602 Shortness of breath: Secondary | ICD-10-CM | POA: Diagnosis not present

## 2019-10-28 MED ORDER — PROMETHAZINE-DM 6.25-15 MG/5ML PO SOLN: 5.0000 mL | ORAL | 0 refills | Status: DC | PRN

## 2019-10-28 MED ORDER — PREDNISONE 20 MG PO TABS: 40.0000 mg | ORAL_TABLET | Freq: Every day | ORAL | 0 refills | Status: AC

## 2019-10-28 MED ORDER — AMOXICILLIN-POT CLAVULANATE 875-125 MG PO TABS: 1.0000 | ORAL_TABLET | Freq: Two times a day (BID) | ORAL | 0 refills | Status: DC

## 2019-10-28 NOTE — Progress Notes (Signed)
Patient ID: Sharon Hunter, female    DOB: 03-May-1961, 59 y.o.   MRN: [id number]  PCP: Carollee Herter, Alferd Apa, DO  Chief Complaint  Patient presents with  . covid positive    Subjective:  HPI  Sharon Hunter is a 59 y.o. female presents to [facility] clinic for evaluation of symptoms related to on-going symptoms related to COVID-19 infection. Patient continues to experience shortness of breath, persistent cough, chest tightness, since recent diagnosis of COVID-19. She had one episode of hemoptyses today. She completed Azithromycin and achieved no improvement of sinus pressure and sinus congestion, an or cough. Prescribed an albuterol without relief, although endorses only using a few times. She is immunocompromised due to RA treatment. She is afebrile x 3 days. No history of PNA.  Review of Systems Pertinent negatives listed in HPI Patient Active Problem List   Diagnosis Date Noted  . History of anxiety 03/27/2017  . History of hypertension 03/27/2017  . History of bundle branch block 03/27/2017  . Primary osteoarthritis of both hands 12/22/2016  . History of shingles 08/02/2016  . High risk medication use 08/02/2016  . Hypertension 08/01/2016  . IBS (irritable bowel syndrome) 08/01/2016  . Anxiety 08/01/2016  . 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 Rate-related bundle branch block 01/30/2015  . Palpitations 01/30/2015  . Pulmonary nodule 07/04/2011  . Murmur 02/03/2011  . Rheumatoid arthritis ([place]) 02/03/2011  . Hair loss 02/03/2011  . Fatigue 02/03/2011      Prior to Admission medications   Medication Sig Start Date End Date Taking? Authorizing Provider  albuterol (VENTOLIN HFA) 108 (90 Base) MCG/ACT inhaler Inhale 2 puffs into the lungs every 6 (six) hours as needed. 10/24/19  Yes Saguier, Percell Miller, PA-C  azithromycin (ZITHROMAX) 250 MG tablet Take 2 tablets by mouth on day 1, followed by 1 tablet by mouth daily for 4 days. 10/24/19  Yes Saguier, Percell Miller, PA-C   diclofenac sodium (VOLTAREN) 1 % GEL APPLY 3 GRAMS TO LARGE JOINTS THREE TIMES DAILY AS NEEDED FOR PAIN 02/15/17  Yes Deveshwar, Abel Presto, MD  Doxylamine Succinate, Sleep, (UNISOM PO) Take 0.5 tablets by mouth at bedtime.   Yes [provider]  Golimumab (SIMPONI) 50 MG/0.5ML SOAJ INJECT 50 MG INTO THE SKIN EVERY 30 DAYS. 07/25/19  Yes Tresa Garter, MD  ibuprofen (ADVIL,MOTRIN) 400 MG tablet Take 400 mg by mouth every 6 (six) hours as needed.   Yes [provider]  MELATONIN PO Take 5-10 mg by mouth at bedtime.   Yes [provider]    Past Medical, Surgical Family and Social History reviewed and updated.    Objective:   Today's Vitals   10/28/19 1759  BP: (!) 140/100  Pulse: 93  Temp: 98.5 F (36.9 C)  SpO2: 97%  Weight: 193 lb (87.5 kg)  Height: 5\' 7"  (1.702 m)    Wt Readings from Last 3 Encounters:  10/28/19 193 lb (87.5 kg)  10/24/19 189 lb (85.7 kg)  03/26/19 188 lb 12.8 oz (85.6 kg)     Physical Exam Constitutional:      Appearance: She is ill-appearing. She is not diaphoretic.  HENT:     Nose: Congestion present.  Cardiovascular:     Rate and Rhythm: Normal rate and regular rhythm.  Pulmonary:     Breath sounds: Decreased air movement and transmitted upper airway sounds present. Examination of the right-upper field reveals rhonchi. Examination of the left-upper field reveals rhonchi. Rhonchi present.  Abdominal:  General: Bowel sounds are normal.  Lymphadenopathy:     Cervical: No cervical adenopathy.  Skin:    General: Skin is warm and dry.  Neurological:     General: No focal deficit present.     Mental Status: She is oriented to person, place, and time.  Psychiatric:        Mood and Affect: Mood normal.      Assessment & Plan:  1. COVID-19  2. SOB (shortness of breath) 3. Bronchitis  -Remains symptomatic today with cough and shortness of breath consistent with COVID bronchitis.  - DG Chest 2 View; Future,  pending -Work note given for 2 days, while patient begins treatment with antibiotics and while imaging is pending. -See medication ordered below:  Meds ordered this encounter  Medications  . predniSONE (DELTASONE) 20 MG tablet    Sig: Take 2 tablets (40 mg total) by mouth daily with breakfast for 5 days.    Dispense:  10 tablet    Refill:  0  . amoxicillin-clavulanate (AUGMENTIN) 875-125 MG tablet    Sig: Take 1 tablet by mouth 2 (two) times daily.    Dispense:  20 tablet    Refill:  0  . Promethazine-DM 6.25-15 MG/5ML SOLN    Sig: Take 5 mLs by mouth every 4 (four) hours as needed.    Dispense:  180 mL    Refill:  0   Will follow-up with imaging results once available.  -The patient was given clear instructions to go to ER or return to medical center if symptoms do not improve, worsen or new problems develop. The patient verbalized understanding.     Molli Barrows, FNP-C [facility], PRN Provider  [facility]. [place], [place] Clinic Phone: [phone] Clinic Fax: [phone] Clinic Hours: 5:30 pm -7:30 pm (Monday-Friday)

## 2019-10-28 NOTE — Patient Instructions (Signed)
For x-ray imaging:  8:00 am-4:30 pm  Go [facility]  [place], [place], [place] [address] Enter through Main Entrance and request x-ray department. You will be contacted either via phone or via [place] with your x-ray result.    Shortness of Breath, Adult Shortness of breath means you have trouble breathing. Shortness of breath could be a sign of a medical problem. Follow these instructions at home:   Watch for any changes in your symptoms.  Do not use any products that contain nicotine or tobacco, such as cigarettes, e-cigarettes, and chewing tobacco.  Do not smoke. Smoking can cause shortness of breath. If you need help to quit smoking, ask your doctor.  Avoid things that can make it harder to breathe, such as: ? Mold. ? Dust. ? Air pollution. ? Chemical smells. ? Things that can cause allergy symptoms (allergens), if you have allergies.  Keep your living space clean. Use products that help remove mold and dust.  Rest as needed. Slowly return to your normal activities.  Take over-the-counter and prescription medicines only as told by your doctor. This includes oxygen therapy and inhaled medicines.  Keep all follow-up visits as told by your doctor. This is important. Contact a doctor if:  Your condition does not get better as soon as expected.  You have a hard time doing your normal activities, even after you rest.  You have new symptoms. Get help right away if:  Your shortness of breath gets worse.  You have trouble breathing when you are resting.  You feel light-headed or you pass out (faint).  You have a cough that is not helped by medicines.  You cough up blood.  You have pain with breathing.  You have pain in your chest, arms, shoulders, or belly (abdomen).  You have a fever.  You cannot walk up stairs.  You cannot exercise the way you normally do. These symptoms may represent a serious problem that is an emergency. Do not wait to see  if the symptoms will go away. Get medical help right away. Call your local emergency services (911 in the [place] Do not drive yourself to the hospital. Summary  Shortness of breath is when you have trouble breathing enough air. It can be a sign of a medical problem.  Avoid things that make it hard for you to breathe, such as smoking, pollution, mold, and dust.  Watch for any changes in your symptoms. Contact your doctor if you do not get better or you get worse. This information is not intended to replace advice given to you by your health care provider. Make sure you discuss any questions you have with your health care provider. Document Revised: 02/19/2018 Document Reviewed: 02/19/2018 Elsevier Patient Education  [place].

## 2019-10-29 ENCOUNTER — Ambulatory Visit (HOSPITAL_COMMUNITY)
Admission: RE | Admit: 2019-10-29 | Discharge: 2019-10-29 | Disposition: A | Discharge status: Home / Self Care | Payer: 59 | Source: Ambulatory Visit | Attending: Family Medicine | Admitting: Family Medicine

## 2019-10-29 ENCOUNTER — Other Ambulatory Visit: Payer: Self-pay

## 2019-10-29 DIAGNOSIS — R0602 Shortness of breath: Secondary | ICD-10-CM | POA: Diagnosis not present

## 2019-10-29 DIAGNOSIS — U071 COVID-19: Secondary | ICD-10-CM | POA: Insufficient documentation

## 2019-10-29 NOTE — Progress Notes (Signed)
See CXR results and notes above.

## 2019-10-30 ENCOUNTER — Encounter (INDEPENDENT_AMBULATORY_CARE_PROVIDER_SITE_OTHER): Payer: Self-pay

## 2019-11-18 ENCOUNTER — Ambulatory Visit (INDEPENDENT_AMBULATORY_CARE_PROVIDER_SITE_OTHER): Payer: 59 | Admitting: Family Medicine

## 2019-11-18 ENCOUNTER — Encounter: Payer: Self-pay | Admitting: Family Medicine

## 2019-11-18 VITALS — BP 139/80 | HR 69 | Ht 67.0 in | Wt 194.0 lb

## 2019-11-18 DIAGNOSIS — R058 Other specified cough: Secondary | ICD-10-CM

## 2019-11-18 DIAGNOSIS — R05 Cough: Secondary | ICD-10-CM

## 2019-11-18 MED ORDER — PREDNISONE 10 MG PO TABS: ORAL_TABLET | ORAL | 0 refills | Status: DC

## 2019-11-18 MED ORDER — HYDROCOD POLST-CPM POLST ER 10-8 MG/5ML PO SUER: 5.0000 mL | Freq: Every evening | ORAL | 0 refills | Status: DC | PRN

## 2019-11-18 MED FILL — HYDROCODONE-CHLORPHEN ER SU: 10-8 | 28 days supply | Qty: 140 | Fill #0

## 2019-11-18 MED FILL — predniSONE 10 MG TABS: 10 | 12 days supply | Qty: 20 | Fill #0

## 2019-11-18 NOTE — Progress Notes (Signed)
Virtual Visit via Video Note  I connected with Sharon Hunter on 11/18/19 at  3:20 PM EST by a video enabled telemedicine application and verified that I am speaking with the correct person using two identifiers.  Location: Patient: home alone  Provider: office    I discussed the limitations of evaluation and management by telemedicine and the availability of in person appointments. The patient expressed understanding and agreed to proceed.  History of Present Illness: Pt Is home and recovered from covid but cough never resolved  Its keeping her awake at night and causing a sore throat  Pt was seen in resp clinic--- put on 5 days of steroids and abx cxr was done as well--- neg  No fevers  Congestion resolved    Observations/Objective: Vitals:   11/18/19 1400  BP: 139/80  Pulse: 69  pt is in NAD Assessment and Plan:  1. Post-viral cough syndrome pred taper  Cough med May need repeat cxr or pulm referral  - predniSONE (DELTASONE) 10 MG tablet; TAKE 3 TABLETS PO QD FOR 3 DAYS THEN TAKE 2 TABLETS PO QD FOR 3 DAYS THEN TAKE 1 TABLET PO QD FOR 3 DAYS THEN TAKE 1/2 TAB PO QD FOR 3 DAYS  Dispense: 20 tablet; Refill: 0 - chlorpheniramine-HYDROcodone (TUSSIONEX PENNKINETIC ER) 10-8 MG/5ML SUER; Take 5 mLs by mouth at bedtime as needed for cough.  Dispense: 140 mL; Refill: 0  Follow Up Instructions:    I discussed the assessment and treatment plan with the patient. The patient was provided an opportunity to ask questions and all were answered. The patient agreed with the plan and demonstrated an understanding of the instructions.   The patient was advised to call back or seek an in-person evaluation if the symptoms worsen or if the condition fails to improve as anticipated.  I provided 25 minutes of non-face-to-face time during this encounter.   [name], DO

## 2019-11-22 ENCOUNTER — Other Ambulatory Visit: Payer: Self-pay | Admitting: Rheumatology

## 2019-11-22 ENCOUNTER — Other Ambulatory Visit: Payer: Self-pay | Admitting: Pharmacist

## 2019-11-22 MED ORDER — SIMPONI 50 MG/0.5ML ~~LOC~~ SOAJ: SUBCUTANEOUS | 0 refills | Status: DC

## 2019-11-22 MED FILL — SIMPONI 50 MG/0.5ML SOAJ: 50 | 30 days supply | Qty: 1 | Fill #0

## 2019-11-22 NOTE — Telephone Encounter (Signed)
Ok to refill 30-day supply

## 2019-11-22 NOTE — Telephone Encounter (Signed)
Last Visit: 08/27/19 next Visit: 12/26/19 Labs: 07/24/19 WNL TB Gold: 07/07/19 Neg   Patient advised she is due to update labs. Patient states she will come 11/25/19  Okay to refill 30 day supply Simponi?

## 2019-12-18 NOTE — Progress Notes (Signed)
Office Visit Note  Patient: Sharon Hunter             Date of Birth: 1961/04/22           MRN: [id number]             PCP: [name], DO Referring: [name], * Visit Date: 12/26/2019 Occupation: @GUAROCC @  Subjective:  Left ankle joint pain and swelling   History of Present Illness: Sharon Hunter is a 59 y.o. female with history of seropositive rheumatoid arthritis and osteoarthritis.  Patient is on Simponi sq injections every 28 days.  She states she held [place] for 1 month due to being diagnosed with Covid-19 in January 2021.   She has resumed Simponi sq injections. She reports she has intermittent pain on the dorsal aspect of the left ankle, which started 2 months ago.  She has noticed swelling intermittently.  She states the pain varies in intensity.  She denies any joint pain or joint swelling.  She denies any morning stiffness.  She has intermittent symptoms of raynaud's, especially while at work.   Activities of Daily Living:  Patient reports morning stiffness for 0 minutes.   Patient Denies nocturnal pain.  Difficulty dressing/grooming: Denies Difficulty climbing stairs: Denies Difficulty getting out of chair: Denies Difficulty using hands for taps, buttons, cutlery, and/or writing: Denies  Review of Systems  Constitutional: Negative for fatigue.  HENT: Positive for mouth dryness and nose dryness. Negative for mouth sores.   Eyes: Positive for dryness. Negative for pain and visual disturbance.  Respiratory: Negative for cough, hemoptysis, shortness of breath and difficulty breathing.   Cardiovascular: Negative for chest pain, palpitations, hypertension and swelling in legs/feet.  Gastrointestinal: Negative for blood in stool, constipation and diarrhea.  Endocrine: Negative for increased urination.  Genitourinary: Negative for difficulty urinating and painful urination.  Musculoskeletal: Positive for arthralgias, joint pain and joint swelling.  Negative for myalgias, muscle weakness, morning stiffness, muscle tenderness and myalgias.  Skin: Negative for color change, pallor, rash, hair loss, nodules/bumps, redness, skin tightness, ulcers and sensitivity to sunlight.  Allergic/Immunologic: Negative for susceptible to infections.  Neurological: Negative for dizziness, numbness, headaches, memory loss and weakness.  Hematological: Negative for bruising/bleeding tendency and swollen glands.  Psychiatric/Behavioral: Negative for depressed mood, confusion and sleep disturbance. The patient is not nervous/anxious.     PMFS History:  Patient Active Problem List   Diagnosis Date Noted  . History of anxiety 03/27/2017  . History of hypertension 03/27/2017  . History of bundle branch block 03/27/2017  . Primary osteoarthritis of both hands 12/22/2016  . History of shingles 08/02/2016  . High risk medication use 08/02/2016  . Hypertension 08/01/2016  . IBS (irritable bowel syndrome) 08/01/2016  . Anxiety 08/01/2016  . 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 Rate-related bundle branch block 01/30/2015  . Palpitations 01/30/2015  . Pulmonary nodule 07/04/2011  . Murmur 02/03/2011  . Rheumatoid arthritis ([place]) 02/03/2011  . Hair loss 02/03/2011  . Fatigue 02/03/2011    Past Medical History:  Diagnosis Date  . Anxiety   . Breast lump    stable-- gets annual mammogram  . 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 Hypertension   . IBS (irritable bowel syndrome) 08/01/2016  . Migraine   . Murmur 02/03/2011  . Ovarian failure   . Pulmonary nodule, left 2009   last CT Nov 2011-- due 08/2011  . Rheumatoid arthritis(714.0)    in remission , off meds  as of Summer 2012    Family History  Problem Relation Age of Onset  . Alcohol abuse Father   . Hyperlipidemia Father   . Heart disease Father   . Alcohol abuse Brother   . Diabetes Brother   . Hypertension Brother   . Arthritis Mother   . Uterine cancer Mother   .  Hypertension Mother   . Heart disease Sister        cardiac stent  . Diabetes Sister   . Hyperlipidemia Sister   . Hypertension Sister   . Cancer Brother        lung  . Cancer Maternal Aunt        lung----non smoker   Past Surgical History:  Procedure Laterality Date  . CHOLECYSTECTOMY  1999  . COLONOSCOPY  10/31/11  . EXPLORATORY LAPAROTOMY    . TONSILLECTOMY  1969  . WISDOM TOOTH EXTRACTION     Social History   Social History Narrative   Exercise-- jogs   Immunization History  Administered Date(s) Administered  . Influenza Split 07/03/2013  . Influenza-Unspecified 07/03/2014, 07/04/2015  . Tdap 12/01/2009     Objective: Vital Signs: BP 134/73 (BP Location: Right Arm, Patient Position: Sitting, Cuff Size: Normal)   Pulse 60   Resp 15   Ht 5\' 7"  (1.702 m)   Wt 196 lb 3.2 oz (89 kg)   LMP 10/03/2005   BMI 30.73 kg/m    Physical Exam Vitals and nursing note reviewed.  Constitutional:      Appearance: She is well-developed.  HENT:     Head: Normocephalic and atraumatic.  Eyes:     Conjunctiva/sclera: Conjunctivae normal.  Pulmonary:     Effort: Pulmonary effort is normal.  Abdominal:     General: Bowel sounds are normal.     Palpations: Abdomen is soft.  Musculoskeletal:     Cervical back: Normal range of motion.  Lymphadenopathy:     Cervical: No cervical adenopathy.  Skin:    General: Skin is warm and dry.     Capillary Refill: Capillary refill takes less than 2 seconds.  Neurological:     Mental Status: She is alert and oriented to person, place, and time.  Psychiatric:        Behavior: Behavior normal.      Musculoskeletal Exam:  C-spine, thoracic spine, and lumbar spine good ROM.  Shoulder joints, elbow joints, wrist joints, MCPs, PIPs, and DIPs good ROM with no synovitis.  Complete fist formation bilaterally.  PIP and DIP thickening consistent with OA of both hands.  Hip joints and knee joints good ROM with no discomfort.  No warmth or effusion of  knee joints.  Left ankle joint warmth and swelling over the lateral malleolus noted. Right ankle joint has good ROM with no tenderness or inflammation.  1st and 5th MTP prominence noted.   CDAI Exam: CDAI Score: -- Patient Global: 4 mm; Provider Global: 4 mm Swollen: 2 ; Tender: 2  Joint Exam 12/26/2019      Right  Left  Ankle     Swollen Tender  Subtalar     Swollen Tender     Investigation: No additional findings.  Imaging: No results found.  Recent Labs: Lab Results  Component Value Date   WBC 7.4 07/24/2019   HGB 12.7 07/24/2019   PLT 274 07/24/2019   NA 138 07/24/2019   K 4.1 07/24/2019   CL 106 07/24/2019   CO2 24 07/24/2019   GLUCOSE 82 07/24/2019  BUN 23 07/24/2019   CREATININE 0.92 07/24/2019   BILITOT 0.3 07/24/2019   ALKPHOS 58 03/27/2017   AST 10 07/24/2019   ALT 15 07/24/2019   PROT 6.4 07/24/2019   ALBUMIN 4.3 03/27/2017   CALCIUM 9.2 07/24/2019   GFRAA 80 07/24/2019   QFTBGOLDPLUS NEGATIVE 09/12/2018    Speciality Comments: Prior therapy: methotrexate, remicade, orencia, and enbrel (inadequate response)  Procedures:  No procedures performed Allergies: Other and Contrast media [iodinated diagnostic agents]   Assessment / Plan:     Visit Diagnoses: Rheumatoid arthritis involving multiple sites with positive rheumatoid factor ([place]): She has tenderness and synovitis of the left ankle joint on exam.  She has been experiencing intermittent pain and inflammation in the left ankle for the past 2 months.  According to the patient she was diagnosed with COVID-19 in January 2021 and held [place] for 1 month.  She has resumed Simponi subcutaneous injections every 28 days.  She is not having any other joint pain or joint swelling at this time.  She is not experiencing any morning stiffness.  She would like to continue on Simponi as prescribed.  A prescription for prednisone starting at 20 mg tapering by 5 mg every 2 days was sent to the pharmacy.  She was  advised to notify us if the left ankle joint pain and inflammation persist.  If the inflammation is persistent a left ankle cortisone injection is an option.  She will follow-up in the office in 5 months.  High risk medication use - Simponi 50 mg every 28 days.Last TB gold negative 07/04/19.  - Plan: CBC with Differential/Platelet, COMPLETE METABOLIC PANEL WITH GFR  Primary osteoarthritis of both hands: She has PIP and DIP thickening consistent with osteoarthritis of both hands.  No tenderness or synovitis was noted.  She has complete fist formation bilaterally.  Joint protection and muscle strengthening were discussed.   Other fatigue: She is not experiencing any fatigue at this time.   Other medical conditions are listed as follows:   History of hypertension  History of vitamin D deficiency  Pulmonary nodule  History of bundle branch block  History of anxiety  Orders: Orders Placed This Encounter  Procedures  . CBC with Differential/Platelet  . COMPLETE METABOLIC PANEL WITH GFR   Meds ordered this encounter  Medications  . predniSONE (DELTASONE) 5 MG tablet    Sig: Take 4 tablets by mouth daily x2 days, 3 tablets by mouth daily x2 days, 2 tablets by mouth daily x2 days, 1 tablet by mouth daily x2 days.    Dispense:  20 tablet    Refill:  0     Follow-Up Instructions: Return in about 5 months (around 05/27/2020) for Rheumatoid arthritis.   Sharon Neas, PA-C  Note - This record has been created using Dragon software.  Chart creation errors have been sought, but may not always  have been located. Such creation errors do not reflect on  the standard of medical care.

## 2019-12-23 ENCOUNTER — Other Ambulatory Visit: Payer: Self-pay

## 2019-12-23 ENCOUNTER — Telehealth: Payer: Self-pay | Admitting: Pharmacist

## 2019-12-23 ENCOUNTER — Ambulatory Visit (HOSPITAL_BASED_OUTPATIENT_CLINIC_OR_DEPARTMENT_OTHER): Payer: 59 | Admitting: Pharmacist

## 2019-12-23 DIAGNOSIS — Z79899 Other long term (current) drug therapy: Secondary | ICD-10-CM

## 2019-12-23 NOTE — Telephone Encounter (Signed)
Called patient to schedule an appointment for the [facility]. I was unable to reach the patient so I left a HIPAA-compliant message requesting that the patient return my call.   

## 2019-12-23 NOTE — Progress Notes (Signed)
S: Patient is currently taking Simponi for rheumatoid arthritis. Patient is managed by [facility] for this.   Adherence: denies any missed doses recently   Efficacy: continues to work well for her.  Dosing:  SubQ: 50 mg once a month   Drug-drug interactions: none  Screening: TB test: completed yearly at work Hepatitis: completed  Monitoring: S/sx of infection: denies, using good infection prevention measures for now.  CBC: WNL; has an appt with Dr. Estanislado Pandy 12/26/19  S/sx of hypersensitivity: denies S/sx of malignancy: denies S/sx of heart failure: denies S/sx of autoimmune disorder: denies   O:     Lab Results  Component Value Date   WBC 7.4 07/24/2019   HGB 12.7 07/24/2019   HCT 38.3 07/24/2019   MCV 93.4 07/24/2019   PLT 274 07/24/2019      Chemistry      Component Value Date/Time   NA 138 07/24/2019 1529   NA 138 02/04/2016 0000   K 4.1 07/24/2019 1529   CL 106 07/24/2019 1529   CO2 24 07/24/2019 1529   BUN 23 07/24/2019 1529   BUN 20 02/04/2016 0000   CREATININE 0.92 07/24/2019 1529   GLU 88 02/04/2016 0000      Component Value Date/Time   CALCIUM 9.2 07/24/2019 1529   ALKPHOS 58 03/27/2017 1341   AST 10 07/24/2019 1529   ALT 15 07/24/2019 1529   BILITOT 0.3 07/24/2019 1529       A/P: 1. Medication review: Patient tolerating Simponi well with no adverse effects. Reviewed the medication with the patient, including the following: Simponi, golimumab, is a TNF blocker. There is an increased risk of infection and malignancy with this medication. Do not give patients live vaccinations while they are on this medication. No changes recommended at this time.   Benard Halsted, PharmD, [place] [phone]

## 2019-12-26 ENCOUNTER — Ambulatory Visit: Payer: 59 | Admitting: Physician Assistant

## 2019-12-26 ENCOUNTER — Other Ambulatory Visit: Payer: Self-pay

## 2019-12-26 ENCOUNTER — Encounter: Payer: Self-pay | Admitting: Physician Assistant

## 2019-12-26 ENCOUNTER — Other Ambulatory Visit: Payer: Self-pay | Admitting: Rheumatology

## 2019-12-26 VITALS — BP 134/73 | HR 60 | Resp 15 | Ht 67.0 in | Wt 196.2 lb

## 2019-12-26 DIAGNOSIS — R911 Solitary pulmonary nodule: Secondary | ICD-10-CM | POA: Diagnosis not present

## 2019-12-26 DIAGNOSIS — Z8639 Personal history of other endocrine, nutritional and metabolic disease: Secondary | ICD-10-CM | POA: Diagnosis not present

## 2019-12-26 DIAGNOSIS — M19042 Primary osteoarthritis, left hand: Secondary | ICD-10-CM | POA: Diagnosis not present

## 2019-12-26 DIAGNOSIS — M0579 Rheumatoid arthritis with rheumatoid factor of multiple sites without organ or systems involvement: Secondary | ICD-10-CM | POA: Diagnosis not present

## 2019-12-26 DIAGNOSIS — M19041 Primary osteoarthritis, right hand: Secondary | ICD-10-CM

## 2019-12-26 DIAGNOSIS — Z8679 Personal history of other diseases of the circulatory system: Secondary | ICD-10-CM | POA: Diagnosis not present

## 2019-12-26 DIAGNOSIS — R5383 Other fatigue: Secondary | ICD-10-CM

## 2019-12-26 DIAGNOSIS — Z79899 Other long term (current) drug therapy: Secondary | ICD-10-CM | POA: Diagnosis not present

## 2019-12-26 DIAGNOSIS — Z8659 Personal history of other mental and behavioral disorders: Secondary | ICD-10-CM

## 2019-12-26 LAB — CBC WITH DIFFERENTIAL/PLATELET
Absolute Monocytes: 589 cells/uL (ref 200–950)
Basophils Absolute: 51 cells/uL (ref 0–200)
Basophils Relative: 0.8 %
Eosinophils Absolute: 250 cells/uL (ref 15–500)
Eosinophils Relative: 3.9 %
HCT: 37.3 % (ref 35.0–45.0)
Hemoglobin: 12.5 g/dL (ref 11.7–15.5)
Lymphs Abs: 2138 cells/uL (ref 850–3900)
MCH: 30.9 pg (ref 27.0–33.0)
MCHC: 33.5 g/dL (ref 32.0–36.0)
MCV: 92.3 fL (ref 80.0–100.0)
MPV: 11 fL (ref 7.5–12.5)
Monocytes Relative: 9.2 %
Neutro Abs: 3373 cells/uL (ref 1500–7800)
Neutrophils Relative %: 52.7 %
Platelets: 271 10*3/uL (ref 140–400)
RBC: 4.04 10*6/uL (ref 3.80–5.10)
RDW: 12.8 % (ref 11.0–15.0)
Total Lymphocyte: 33.4 %
WBC: 6.4 10*3/uL (ref 3.8–10.8)

## 2019-12-26 LAB — COMPLETE METABOLIC PANEL WITH GFR
AG Ratio: 2 (calc) (ref 1.0–2.5)
ALT: 22 U/L (ref 6–29)
AST: 14 U/L (ref 10–35)
Albumin: 4.4 g/dL (ref 3.6–5.1)
Alkaline phosphatase (APISO): 65 U/L (ref 37–153)
BUN: 20 mg/dL (ref 7–25)
CO2: 25 mmol/L (ref 20–32)
Calcium: 9.1 mg/dL (ref 8.6–10.4)
Chloride: 106 mmol/L (ref 98–110)
Creat: 0.8 mg/dL (ref 0.50–1.05)
GFR, Est African American: 94 mL/min/{1.73_m2} (ref >=60)
GFR, Est Non African American: 81 mL/min/{1.73_m2} (ref >=60)
Globulin: 2.2 g/dL (calc) (ref 1.9–3.7)
Glucose, Bld: 85 mg/dL (ref 65–99)
Potassium: 4.2 mmol/L (ref 3.5–5.3)
Sodium: 139 mmol/L (ref 135–146)
Total Bilirubin: 0.4 mg/dL (ref 0.2–1.2)
Total Protein: 6.6 g/dL (ref 6.1–8.1)

## 2019-12-26 MED ORDER — PREDNISONE 5 MG PO TABS: ORAL_TABLET | ORAL | 0 refills | Status: DC

## 2019-12-26 MED FILL — predniSONE 5 MG TABS: 5 | 8 days supply | Qty: 20 | Fill #0

## 2019-12-26 NOTE — Telephone Encounter (Signed)
Last Visit: 12/26/19 Next visit: 05/26/20 Labs: 07/24/19 CBC and CMP WNL (Updated labs today) Tb Gold: 07/04/19 Neg   Okay to refill Simponi?

## 2019-12-26 NOTE — Telephone Encounter (Signed)
Ok to refill Simponi once labs have resulted.

## 2019-12-27 ENCOUNTER — Other Ambulatory Visit: Payer: Self-pay | Admitting: Rheumatology

## 2019-12-27 NOTE — Telephone Encounter (Signed)
Labs have results and are WNL.

## 2019-12-27 NOTE — Progress Notes (Signed)
CBC and CMP WNL

## 2019-12-30 ENCOUNTER — Other Ambulatory Visit: Payer: Self-pay | Admitting: Pharmacist

## 2019-12-30 MED ORDER — SIMPONI 50 MG/0.5ML ~~LOC~~ SOAJ: SUBCUTANEOUS | 0 refills | Status: DC

## 2019-12-30 MED FILL — SIMPONI 50 MG/0.5ML SOAJ: 50 | 30 days supply | Qty: 1 | Fill #0

## 2020-02-03 ENCOUNTER — Other Ambulatory Visit: Payer: Self-pay | Admitting: Rheumatology

## 2020-02-03 ENCOUNTER — Other Ambulatory Visit: Payer: Self-pay | Admitting: Pharmacist

## 2020-02-03 MED ORDER — SIMPONI 50 MG/0.5ML ~~LOC~~ SOAJ: 50.0000 mg | SUBCUTANEOUS | 0 refills | Status: DC

## 2020-02-03 MED FILL — SIMPONI 50 MG/0.5ML SOAJ: 50 | 30 days supply | Qty: 1 | Fill #0

## 2020-02-03 NOTE — Telephone Encounter (Signed)
Last Visit: 12/26/2019 Next Visit: 05/26/2020 Labs: 12/26/2019 CBC and CMP WNL TB Gold: 07/04/2019 negative   Okay to refill per Dr. Estanislado Pandy.

## 2020-02-28 MED FILL — AMOXICILLIN 250 MG/5 ML SUS: 250 | 6 days supply | Qty: 200 | Fill #0

## 2020-03-03 MED FILL — CLINDAMYCIN 75 MG/5 ML SOLN: 75 | 6 days supply | Qty: 200 | Fill #0

## 2020-03-25 MED FILL — SIMPONI 50 MG/0.5ML SOAJ: 50 | 30 days supply | Qty: 1 | Fill #1

## 2020-03-30 DIAGNOSIS — M9903 Segmental and somatic dysfunction of lumbar region: Secondary | ICD-10-CM | POA: Diagnosis not present

## 2020-03-30 DIAGNOSIS — M62830 Muscle spasm of back: Secondary | ICD-10-CM | POA: Diagnosis not present

## 2020-03-30 DIAGNOSIS — M9904 Segmental and somatic dysfunction of sacral region: Secondary | ICD-10-CM | POA: Diagnosis not present

## 2020-03-30 DIAGNOSIS — M5386 Other specified dorsopathies, lumbar region: Secondary | ICD-10-CM | POA: Diagnosis not present

## 2020-03-30 DIAGNOSIS — M9902 Segmental and somatic dysfunction of thoracic region: Secondary | ICD-10-CM | POA: Diagnosis not present

## 2020-03-30 DIAGNOSIS — M9901 Segmental and somatic dysfunction of cervical region: Secondary | ICD-10-CM | POA: Diagnosis not present

## 2020-03-31 ENCOUNTER — Telehealth: Payer: Self-pay | Admitting: Pharmacist

## 2020-03-31 DIAGNOSIS — M9902 Segmental and somatic dysfunction of thoracic region: Secondary | ICD-10-CM | POA: Diagnosis not present

## 2020-03-31 DIAGNOSIS — M5386 Other specified dorsopathies, lumbar region: Secondary | ICD-10-CM | POA: Diagnosis not present

## 2020-03-31 DIAGNOSIS — M9904 Segmental and somatic dysfunction of sacral region: Secondary | ICD-10-CM | POA: Diagnosis not present

## 2020-03-31 DIAGNOSIS — M9903 Segmental and somatic dysfunction of lumbar region: Secondary | ICD-10-CM | POA: Diagnosis not present

## 2020-03-31 DIAGNOSIS — Z79899 Other long term (current) drug therapy: Secondary | ICD-10-CM

## 2020-03-31 DIAGNOSIS — M62830 Muscle spasm of back: Secondary | ICD-10-CM | POA: Diagnosis not present

## 2020-03-31 DIAGNOSIS — M9901 Segmental and somatic dysfunction of cervical region: Secondary | ICD-10-CM | POA: Diagnosis not present

## 2020-03-31 NOTE — Telephone Encounter (Signed)
Submitted a Prior Authorization request to [facility] for [facility] via Cover My Meds. Will update once we receive a response.  Key # [id number]  Mariella Saa, PharmD, BCACP, CPP Clinical Specialty Pharmacist (Rheumatology and Pulmonology)  03/31/2020 9:49 AM

## 2020-04-01 NOTE — Telephone Encounter (Signed)
Received notification from [facility] regarding a prior authorization for [facility]. Authorization has been APPROVED from 04/13/20 to 03/13/21.   Authorization # [id number]

## 2020-04-02 DIAGNOSIS — M9901 Segmental and somatic dysfunction of cervical region: Secondary | ICD-10-CM | POA: Diagnosis not present

## 2020-04-02 DIAGNOSIS — M9903 Segmental and somatic dysfunction of lumbar region: Secondary | ICD-10-CM | POA: Diagnosis not present

## 2020-04-02 DIAGNOSIS — M5386 Other specified dorsopathies, lumbar region: Secondary | ICD-10-CM | POA: Diagnosis not present

## 2020-04-02 DIAGNOSIS — M9904 Segmental and somatic dysfunction of sacral region: Secondary | ICD-10-CM | POA: Diagnosis not present

## 2020-04-02 DIAGNOSIS — M62830 Muscle spasm of back: Secondary | ICD-10-CM | POA: Diagnosis not present

## 2020-04-02 DIAGNOSIS — M9902 Segmental and somatic dysfunction of thoracic region: Secondary | ICD-10-CM | POA: Diagnosis not present

## 2020-04-07 DIAGNOSIS — M9901 Segmental and somatic dysfunction of cervical region: Secondary | ICD-10-CM | POA: Diagnosis not present

## 2020-04-07 DIAGNOSIS — M5386 Other specified dorsopathies, lumbar region: Secondary | ICD-10-CM | POA: Diagnosis not present

## 2020-04-07 DIAGNOSIS — M9903 Segmental and somatic dysfunction of lumbar region: Secondary | ICD-10-CM | POA: Diagnosis not present

## 2020-04-07 DIAGNOSIS — M9902 Segmental and somatic dysfunction of thoracic region: Secondary | ICD-10-CM | POA: Diagnosis not present

## 2020-04-07 DIAGNOSIS — M62830 Muscle spasm of back: Secondary | ICD-10-CM | POA: Diagnosis not present

## 2020-04-07 DIAGNOSIS — M9904 Segmental and somatic dysfunction of sacral region: Secondary | ICD-10-CM | POA: Diagnosis not present

## 2020-04-09 DIAGNOSIS — M9901 Segmental and somatic dysfunction of cervical region: Secondary | ICD-10-CM | POA: Diagnosis not present

## 2020-04-09 DIAGNOSIS — M62830 Muscle spasm of back: Secondary | ICD-10-CM | POA: Diagnosis not present

## 2020-04-09 DIAGNOSIS — M9904 Segmental and somatic dysfunction of sacral region: Secondary | ICD-10-CM | POA: Diagnosis not present

## 2020-04-09 DIAGNOSIS — M9902 Segmental and somatic dysfunction of thoracic region: Secondary | ICD-10-CM | POA: Diagnosis not present

## 2020-04-09 DIAGNOSIS — M5386 Other specified dorsopathies, lumbar region: Secondary | ICD-10-CM | POA: Diagnosis not present

## 2020-04-09 DIAGNOSIS — M9903 Segmental and somatic dysfunction of lumbar region: Secondary | ICD-10-CM | POA: Diagnosis not present

## 2020-04-10 DIAGNOSIS — M9902 Segmental and somatic dysfunction of thoracic region: Secondary | ICD-10-CM | POA: Diagnosis not present

## 2020-04-10 DIAGNOSIS — M9903 Segmental and somatic dysfunction of lumbar region: Secondary | ICD-10-CM | POA: Diagnosis not present

## 2020-04-10 DIAGNOSIS — M5386 Other specified dorsopathies, lumbar region: Secondary | ICD-10-CM | POA: Diagnosis not present

## 2020-04-10 DIAGNOSIS — M9901 Segmental and somatic dysfunction of cervical region: Secondary | ICD-10-CM | POA: Diagnosis not present

## 2020-04-10 DIAGNOSIS — M62830 Muscle spasm of back: Secondary | ICD-10-CM | POA: Diagnosis not present

## 2020-04-10 DIAGNOSIS — M9904 Segmental and somatic dysfunction of sacral region: Secondary | ICD-10-CM | POA: Diagnosis not present

## 2020-04-13 ENCOUNTER — Telehealth: Payer: Self-pay | Admitting: Rheumatology

## 2020-04-13 ENCOUNTER — Other Ambulatory Visit: Payer: Self-pay

## 2020-04-13 DIAGNOSIS — M5386 Other specified dorsopathies, lumbar region: Secondary | ICD-10-CM | POA: Diagnosis not present

## 2020-04-13 DIAGNOSIS — Z79899 Other long term (current) drug therapy: Secondary | ICD-10-CM

## 2020-04-13 DIAGNOSIS — M9902 Segmental and somatic dysfunction of thoracic region: Secondary | ICD-10-CM | POA: Diagnosis not present

## 2020-04-13 DIAGNOSIS — M9903 Segmental and somatic dysfunction of lumbar region: Secondary | ICD-10-CM | POA: Diagnosis not present

## 2020-04-13 DIAGNOSIS — M62830 Muscle spasm of back: Secondary | ICD-10-CM | POA: Diagnosis not present

## 2020-04-13 DIAGNOSIS — M9904 Segmental and somatic dysfunction of sacral region: Secondary | ICD-10-CM | POA: Diagnosis not present

## 2020-04-13 DIAGNOSIS — M9901 Segmental and somatic dysfunction of cervical region: Secondary | ICD-10-CM | POA: Diagnosis not present

## 2020-04-13 NOTE — Telephone Encounter (Signed)
Patient request refill on Simponi sent to [facility] once lab results from today come back.

## 2020-04-14 ENCOUNTER — Other Ambulatory Visit: Payer: Self-pay | Admitting: Pharmacist

## 2020-04-14 LAB — CBC WITH DIFFERENTIAL/PLATELET
Absolute Monocytes: 578 cells/uL (ref 200–950)
Basophils Absolute: 38 cells/uL (ref 0–200)
Basophils Relative: 0.5 %
Eosinophils Absolute: 129 cells/uL (ref 15–500)
Eosinophils Relative: 1.7 %
HCT: 39.6 % (ref 35.0–45.0)
Hemoglobin: 13.1 g/dL (ref 11.7–15.5)
Lymphs Abs: 2592 cells/uL (ref 850–3900)
MCH: 30.3 pg (ref 27.0–33.0)
MCHC: 33.1 g/dL (ref 32.0–36.0)
MCV: 91.7 fL (ref 80.0–100.0)
MPV: 10.7 fL (ref 7.5–12.5)
Monocytes Relative: 7.6 %
Neutro Abs: 4264 cells/uL (ref 1500–7800)
Neutrophils Relative %: 56.1 %
Platelets: 274 10*3/uL (ref 140–400)
RBC: 4.32 10*6/uL (ref 3.80–5.10)
RDW: 12.6 % (ref 11.0–15.0)
Total Lymphocyte: 34.1 %
WBC: 7.6 10*3/uL (ref 3.8–10.8)

## 2020-04-14 LAB — COMPLETE METABOLIC PANEL WITH GFR
AG Ratio: 1.7 (calc) (ref 1.0–2.5)
ALT: 22 U/L (ref 6–29)
AST: 13 U/L (ref 10–35)
Albumin: 4.3 g/dL (ref 3.6–5.1)
Alkaline phosphatase (APISO): 66 U/L (ref 37–153)
BUN: 22 mg/dL (ref 7–25)
CO2: 25 mmol/L (ref 20–32)
Calcium: 9.5 mg/dL (ref 8.6–10.4)
Chloride: 104 mmol/L (ref 98–110)
Creat: 0.7 mg/dL (ref 0.50–1.05)
GFR, Est African American: 110 mL/min/{1.73_m2} (ref >=60)
GFR, Est Non African American: 95 mL/min/{1.73_m2} (ref >=60)
Globulin: 2.6 g/dL (calc) (ref 1.9–3.7)
Glucose, Bld: 82 mg/dL (ref 65–99)
Potassium: 4.3 mmol/L (ref 3.5–5.3)
Sodium: 138 mmol/L (ref 135–146)
Total Bilirubin: 0.5 mg/dL (ref 0.2–1.2)
Total Protein: 6.9 g/dL (ref 6.1–8.1)

## 2020-04-14 MED ORDER — SIMPONI 50 MG/0.5ML ~~LOC~~ SOAJ: 50.0000 mg | SUBCUTANEOUS | 0 refills | Status: DC

## 2020-04-14 NOTE — Telephone Encounter (Signed)
Last Visit: 12/26/2019 Next Visit: 05/26/2020 Labs: 04/13/2020 CBC and CMP are normal. TB Gold: 07/04/2019 negative   Current Dose per office note on 12/26/2019: Simponi 50 mg every 28 days  DS:[id number] arthritis involving multiple sites with positive rheumatoid factor   Okay to refill Simponi?

## 2020-04-14 NOTE — Progress Notes (Signed)
CBC and CMP are normal.

## 2020-04-20 DIAGNOSIS — M9901 Segmental and somatic dysfunction of cervical region: Secondary | ICD-10-CM | POA: Diagnosis not present

## 2020-04-20 DIAGNOSIS — M9903 Segmental and somatic dysfunction of lumbar region: Secondary | ICD-10-CM | POA: Diagnosis not present

## 2020-04-20 DIAGNOSIS — M9902 Segmental and somatic dysfunction of thoracic region: Secondary | ICD-10-CM | POA: Diagnosis not present

## 2020-04-20 DIAGNOSIS — M9904 Segmental and somatic dysfunction of sacral region: Secondary | ICD-10-CM | POA: Diagnosis not present

## 2020-04-20 DIAGNOSIS — M5386 Other specified dorsopathies, lumbar region: Secondary | ICD-10-CM | POA: Diagnosis not present

## 2020-04-20 DIAGNOSIS — M62830 Muscle spasm of back: Secondary | ICD-10-CM | POA: Diagnosis not present

## 2020-04-23 DIAGNOSIS — M9904 Segmental and somatic dysfunction of sacral region: Secondary | ICD-10-CM | POA: Diagnosis not present

## 2020-04-23 DIAGNOSIS — M5386 Other specified dorsopathies, lumbar region: Secondary | ICD-10-CM | POA: Diagnosis not present

## 2020-04-23 DIAGNOSIS — M62830 Muscle spasm of back: Secondary | ICD-10-CM | POA: Diagnosis not present

## 2020-04-23 DIAGNOSIS — M9902 Segmental and somatic dysfunction of thoracic region: Secondary | ICD-10-CM | POA: Diagnosis not present

## 2020-04-23 DIAGNOSIS — M9901 Segmental and somatic dysfunction of cervical region: Secondary | ICD-10-CM | POA: Diagnosis not present

## 2020-04-23 DIAGNOSIS — M9903 Segmental and somatic dysfunction of lumbar region: Secondary | ICD-10-CM | POA: Diagnosis not present

## 2020-04-28 DIAGNOSIS — M62830 Muscle spasm of back: Secondary | ICD-10-CM | POA: Diagnosis not present

## 2020-04-28 DIAGNOSIS — M9903 Segmental and somatic dysfunction of lumbar region: Secondary | ICD-10-CM | POA: Diagnosis not present

## 2020-04-28 DIAGNOSIS — M9904 Segmental and somatic dysfunction of sacral region: Secondary | ICD-10-CM | POA: Diagnosis not present

## 2020-04-28 DIAGNOSIS — M5386 Other specified dorsopathies, lumbar region: Secondary | ICD-10-CM | POA: Diagnosis not present

## 2020-04-28 DIAGNOSIS — M9902 Segmental and somatic dysfunction of thoracic region: Secondary | ICD-10-CM | POA: Diagnosis not present

## 2020-04-28 DIAGNOSIS — M9901 Segmental and somatic dysfunction of cervical region: Secondary | ICD-10-CM | POA: Diagnosis not present

## 2020-05-04 DIAGNOSIS — M9902 Segmental and somatic dysfunction of thoracic region: Secondary | ICD-10-CM | POA: Diagnosis not present

## 2020-05-04 DIAGNOSIS — M9904 Segmental and somatic dysfunction of sacral region: Secondary | ICD-10-CM | POA: Diagnosis not present

## 2020-05-04 DIAGNOSIS — M5386 Other specified dorsopathies, lumbar region: Secondary | ICD-10-CM | POA: Diagnosis not present

## 2020-05-04 DIAGNOSIS — M9903 Segmental and somatic dysfunction of lumbar region: Secondary | ICD-10-CM | POA: Diagnosis not present

## 2020-05-04 DIAGNOSIS — M9901 Segmental and somatic dysfunction of cervical region: Secondary | ICD-10-CM | POA: Diagnosis not present

## 2020-05-04 DIAGNOSIS — M62830 Muscle spasm of back: Secondary | ICD-10-CM | POA: Diagnosis not present

## 2020-05-12 NOTE — Progress Notes (Signed)
Office Visit Note  Patient: Sharon Hunter             Date of Birth: [birth year]           MRN: [id number]             PCP: [name], DO Referring: [name], * Visit Date: 05/26/2020 Occupation: @GUAROCC @  Subjective:  Sicca symptoms   History of Present Illness: Sharon Hunter is a 59 y.o. female with history of seropositive rheumatoid arthritis.  Patient is on Simponi 50 mg subcutaneous injections every 30 days. She denies any recent rheumatoid arthritis flares.  She has persistent pain in both hip joints and her lower back.  She is seeing a chiropractor and had updated lumbar spine x-rays recently. She denies any other joint pain or joint swelling. She uses Voltaren gel topically as needed for pain relief.  She presents today with increased pain in both hip joints.   She has also been having increased sicca symptoms.  She uses Xiidra eyedrops one to two times daily for symptomatic relief. She has noticed increased mouth dryness, nose dryness, and vaginal dryness over the past 1 year.  She is seeing her dentist every 6 months.   She was diagnosed with COVID-19 in January 2021.  She has had both COVID-19 vaccinations since then.   Activities of Daily Living:  Patient reports morning stiffness for  0 minutes.   Patient Denies nocturnal pain.  Difficulty dressing/grooming: Denies Difficulty climbing stairs: Denies Difficulty getting out of chair: Denies Difficulty using hands for taps, buttons, cutlery, and/or writing: Denies  Review of Systems  Constitutional: Positive for fatigue.  HENT: Positive for mouth dryness and nose dryness. Negative for mouth sores.   Eyes: Positive for dryness.  Respiratory: Positive for shortness of breath. Negative for difficulty breathing.   Cardiovascular: Negative for chest pain and palpitations.  Gastrointestinal: Negative for blood in stool, constipation and diarrhea.  Endocrine: Negative for increased urination.    Genitourinary: Negative for difficulty urinating.  Musculoskeletal: Positive for arthralgias and joint pain. Negative for joint swelling, myalgias, morning stiffness, muscle tenderness and myalgias.  Skin: Negative for color change, rash and redness.  Allergic/Immunologic: Negative for susceptible to infections.  Neurological: Negative for dizziness, numbness, headaches, memory loss and weakness.  Hematological: Positive for bruising/bleeding tendency.  Psychiatric/Behavioral: Negative for confusion.    PMFS History:  Patient Active Problem List   Diagnosis Date Noted  . History of anxiety 03/27/2017  . History of hypertension 03/27/2017  . History of bundle branch block 03/27/2017  . Primary osteoarthritis of both hands 12/22/2016  . History of shingles 08/02/2016  . High risk medication use 08/02/2016  . Hypertension 08/01/2016  . IBS (irritable bowel syndrome) 08/01/2016  . Anxiety 08/01/2016  . 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 Rate-related bundle branch block 01/30/2015  . Palpitations 01/30/2015  . Pulmonary nodule 07/04/2011  . Murmur 02/03/2011  . Rheumatoid arthritis ([place]) 02/03/2011  . Hair loss 02/03/2011  . Fatigue 02/03/2011    Past Medical History:  Diagnosis Date  . Anxiety   . Breast lump    stable-- gets annual mammogram  . 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 Hypertension   . IBS (irritable bowel syndrome) 08/01/2016  . Migraine   . Murmur 02/03/2011  . Ovarian failure   . Pulmonary nodule, left 2009   last CT Nov 2011-- due 08/2011  . Rheumatoid arthritis(714.0)    in remission ,  off meds as of Summer 2012    Family History  Problem Relation Age of Onset  . Alcohol abuse Father   . Hyperlipidemia Father   . Heart disease Father   . Alcohol abuse Brother   . Diabetes Brother   . Hypertension Brother   . Arthritis Mother   . Uterine cancer Mother   . Hypertension Mother   . Heart disease Sister        cardiac stent   . Diabetes Sister   . Hyperlipidemia Sister   . Hypertension Sister   . Cancer Brother        lung  . Cancer Maternal Aunt        lung----non smoker   Past Surgical History:  Procedure Laterality Date  . CHOLECYSTECTOMY  1999  . COLONOSCOPY  10/31/11  . EXPLORATORY LAPAROTOMY    . TONSILLECTOMY  1969  . WISDOM TOOTH EXTRACTION     Social History   Social History Narrative   Exercise-- jogs   Immunization History  Administered Date(s) Administered  . Influenza Split 07/03/2013  . Influenza-Unspecified 07/03/2014, 07/04/2015  . PFIZER SARS-COV-2 Vaccination 01/13/2020, 05/15/2020  . Tdap 12/01/2009     Objective: Vital Signs: BP 119/66 (BP Location: Left Arm, Patient Position: Sitting, Cuff Size: Normal)   Pulse 65   Resp 14   Ht 5\' 7"  (1.702 m)   Wt 199 lb 3.2 oz (90.4 kg)   LMP 10/03/2005   BMI 31.20 kg/m    Physical Exam Vitals and nursing note reviewed.  Constitutional:      Appearance: She is well-developed.  HENT:     Head: Normocephalic and atraumatic.  Eyes:     Conjunctiva/sclera: Conjunctivae normal.  Pulmonary:     Effort: Pulmonary effort is normal.  Abdominal:     Palpations: Abdomen is soft.  Musculoskeletal:     Cervical back: Normal range of motion.  Skin:    General: Skin is warm and dry.     Capillary Refill: Capillary refill takes less than 2 seconds.  Neurological:     Mental Status: She is alert and oriented to person, place, and time.  Psychiatric:        Behavior: Behavior normal.      Musculoskeletal Exam: C-spine, thoracic spine, and lumbar spine good ROM.  Shoulder joints, elbow joints, wrist joints, MCPs, PIPs, and DIPs good ROM with no synovitis.  Complete fist formation bilaterally.  Synovial thickening of the right 2nd and 3rd MCP joints. Knee joints good ROM with no warmth or effusion.  No tenderness or swelling of ankle joints.   CDAI Exam: CDAI Score: -- Patient Global: --; Provider Global: -- Swollen: --; Tender:  -- Joint Exam 05/26/2020   No joint exam has been documented for this visit   There is currently no information documented on the homunculus. Go to the Rheumatology activity and complete the homunculus joint exam.  Investigation: No additional findings.  Imaging: No results found.  Recent Labs: Lab Results  Component Value Date   WBC 7.6 04/13/2020   HGB 13.1 04/13/2020   PLT 274 04/13/2020   NA 138 04/13/2020   K 4.3 04/13/2020   CL 104 04/13/2020   CO2 25 04/13/2020   GLUCOSE 82 04/13/2020   BUN 22 04/13/2020   CREATININE 0.70 04/13/2020   BILITOT 0.5 04/13/2020   ALKPHOS 58 03/27/2017   AST 13 04/13/2020   ALT 22 04/13/2020   PROT 6.9 04/13/2020   ALBUMIN 4.3 03/27/2017   CALCIUM  9.5 04/13/2020   GFRAA 110 04/13/2020   QFTBGOLDPLUS NEGATIVE 09/12/2018    Speciality Comments: Prior therapy: methotrexate, remicade, orencia, and enbrel (inadequate response)  Procedures:  No procedures performed Allergies: Other and Contrast media [iodinated diagnostic agents]   Assessment / Plan:     Visit Diagnoses: Rheumatoid arthritis involving multiple sites with positive rheumatoid factor ([place]): She has no tenderness or synovitis on exam.  She has synovial thickening of the right second and third MCP joints but no inflammation was noted.  She is able to make a complete fist bilaterally.  She continues to have persistent discomfort in both hip joints and her lower back, and she has been seeing a chiropractor on a regular basis which has been helpful.  She experiences intermittent discomfort in both shoulders but has good range of motion on examination today.  Overall she has clinically been doing well on Simponi 50 mg subcutaneous injections once every 30 days.  She will continue on the current treatment regimen.  She was advised to notify us if she develops increased joint pain or joint swelling.  She will follow-up in the office in 5 months.  High risk medication use - Simponi 50  mg every 28 days.CBC and CMP were within normal limits on 04/13/2020.  She will be due to update lab work in October and every 3 months to monitor for drug toxicity.  Standing orders for CBC and CMP are in place.  Last TB gold negative 07/04/19.  Future order for TB gold was placed today. She was diagnosed with covid-19 in January 2021 and has had complete resolution of symptoms.  She has since had both pfizer vaccinations.  She was encouraged to receive the booster dose since she is considered high risk since she is immunosuppressed. ACR does not recommend changing the timing of the vaccination or simponi dose but she was advised to try to space the two doses if possible.  She works as a Marine scientist at [employer], and she was strongly encouraged to continue to wear a mask, face shield, practice good hand hygiene, and social distance whenever possible. We highly recommend that she avoid direct contact with Covid-19 patients and recommend avoiding testing patients for covid.  She was advised to hold simponi if she develops signs or symptoms of an infection and to resume once the infection has completely cleared.  She was advised to notify us or her PCP if she develops covid infection in order to receive the antibody infusion.  She voiced understanding.   Primary osteoarthritis of both hands:  She has PIP and DIP thickening consistent with osteoarthritis of both hands.  No tenderness or synovitis noted on exam.  Complete fist formation bilaterally.  Joint protection and muscle strengthening were discussed. She was given a handout of hand exercises to perform.   Other fatigue: Stable   Sicca syndrome ([place]): She has been experiencing increased sicca symptoms including eye dryness, mouth dryness, nose dryness, and vaginal dryness for the past 1 year.  She was evaluated by her ophthalmologist and was started on dietary eyedrops twice daily.  She has noticed improvement since using Xiidra and has also started to use a  lubricating gel for symptomatic relief.  She was also encouraged to take omega 3 daily. We discussed the use of Biotene products or XyliMelts to help with the mouth dryness she is experiencing.  We also discussed the use of a humidifier.  We will obtain [facility] lab work to determine if she may have secondary  Sjogren's.  We discussed the diagnosis of Sjogren's and possible treatment options in detail.  All questions were addressed.  Other medical conditions are listed as follows:   History of vitamin D deficiency  History of hypertension  Pulmonary nodule  History of anxiety  History of bundle branch block  Orders: No orders of the defined types were placed in this encounter.  No orders of the defined types were placed in this encounter.   Face-to-face time spent with patient was 30 minutes. Greater than 50% of time was spent in counseling and coordination of care.  Follow-Up Instructions: Return in about 5 months (around 10/26/2020) for Rheumatoid arthritis, Osteoarthritis.   Ofilia Neas, PA-C  Note - This record has been created using Dragon software.  Chart creation errors have been sought, but may not always  have been located. Such creation errors do not reflect on  the standard of medical care.

## 2020-05-19 DIAGNOSIS — M62830 Muscle spasm of back: Secondary | ICD-10-CM | POA: Diagnosis not present

## 2020-05-19 DIAGNOSIS — M9903 Segmental and somatic dysfunction of lumbar region: Secondary | ICD-10-CM | POA: Diagnosis not present

## 2020-05-19 DIAGNOSIS — M9901 Segmental and somatic dysfunction of cervical region: Secondary | ICD-10-CM | POA: Diagnosis not present

## 2020-05-19 DIAGNOSIS — M5386 Other specified dorsopathies, lumbar region: Secondary | ICD-10-CM | POA: Diagnosis not present

## 2020-05-19 DIAGNOSIS — M9902 Segmental and somatic dysfunction of thoracic region: Secondary | ICD-10-CM | POA: Diagnosis not present

## 2020-05-19 DIAGNOSIS — M9904 Segmental and somatic dysfunction of sacral region: Secondary | ICD-10-CM | POA: Diagnosis not present

## 2020-05-26 ENCOUNTER — Encounter: Payer: Self-pay | Admitting: Physician Assistant

## 2020-05-26 ENCOUNTER — Other Ambulatory Visit: Payer: Self-pay

## 2020-05-26 ENCOUNTER — Ambulatory Visit: Payer: 59 | Admitting: Physician Assistant

## 2020-05-26 VITALS — BP 119/66 | HR 65 | Resp 14 | Ht 67.0 in | Wt 199.2 lb

## 2020-05-26 DIAGNOSIS — M19042 Primary osteoarthritis, left hand: Secondary | ICD-10-CM

## 2020-05-26 DIAGNOSIS — M19041 Primary osteoarthritis, right hand: Secondary | ICD-10-CM | POA: Diagnosis not present

## 2020-05-26 DIAGNOSIS — R911 Solitary pulmonary nodule: Secondary | ICD-10-CM | POA: Diagnosis not present

## 2020-05-26 DIAGNOSIS — Z8639 Personal history of other endocrine, nutritional and metabolic disease: Secondary | ICD-10-CM | POA: Diagnosis not present

## 2020-05-26 DIAGNOSIS — Z8679 Personal history of other diseases of the circulatory system: Secondary | ICD-10-CM

## 2020-05-26 DIAGNOSIS — R5383 Other fatigue: Secondary | ICD-10-CM

## 2020-05-26 DIAGNOSIS — M0579 Rheumatoid arthritis with rheumatoid factor of multiple sites without organ or systems involvement: Secondary | ICD-10-CM | POA: Diagnosis not present

## 2020-05-26 DIAGNOSIS — Z8659 Personal history of other mental and behavioral disorders: Secondary | ICD-10-CM

## 2020-05-26 DIAGNOSIS — Z79899 Other long term (current) drug therapy: Secondary | ICD-10-CM

## 2020-05-26 DIAGNOSIS — M3500 Sicca syndrome, unspecified: Secondary | ICD-10-CM | POA: Diagnosis not present

## 2020-05-26 NOTE — Patient Instructions (Addendum)
COVID-19 vaccine recommendations:   COVID-19 vaccine is recommended for everyone (unless you are allergic to a vaccine component), even if you are on a medication that suppresses your immune system.   If you are on Methotrexate, Cellcept (mycophenolate), Rinvoq, Morrie Sheldon, and Olumiant- hold the medication for 1 week after each vaccine. Hold Methotrexate for 2 weeks after the single dose COVID-19 vaccine.   If you are on Orencia subcutaneous injection - hold medication one week prior to and one week after the first COVID-19 vaccine dose (only).   If you are on Orencia IV infusions- time vaccination administration so that the first COVID-19 vaccination will occur four weeks after the infusion and postpone the subsequent infusion by one week.   If you are on Cyclophosphamide or Rituxan infusions please contact your doctor prior to receiving the COVID-19 vaccine.   Do not take Tylenol or any anti-inflammatory medications (NSAIDs) 24 hours prior to the COVID-19 vaccination.   There is no direct evidence about the efficacy of the COVID-19 vaccine in individuals who are on medications that suppress the immune system.   Even if you are fully vaccinated, and you are on any medications that suppress your immune system, please continue to wear a mask, maintain at least six feet social distance and practice hand hygiene.   If you develop a COVID-19 infection, please contact your PCP or our office to determine if you need antibody infusion.  The booster vaccine is now available for immunocompromised patients. It is advised that if you had Pfizer vaccine you should get Coca-Cola booster.  If you had a Moderna vaccine then you should get a Moderna booster. Johnson and Wynetta Emery does not have a booster vaccine at this time.  Please see the following web sites for updated information.    https://[email or web address]/Portals/[email or web address]/[email or web address]-Vaccination-Patient-Resources.[email or web address]  [email or web address]://[email or web address]/About-Us/[email or web address]-Releases/ID/1159    Standing Labs We placed an order today for your standing lab work.   Please have your standing labs drawn in October and every 3 months   If possible, please have your labs drawn 2 weeks prior to your appointment so that the provider can discuss your results at your appointment.  We have open lab daily Monday through Thursday from 8:30-12:30 PM and 1:30-4:30 PM and Friday from 8:30-12:30 PM and 1:30-4:00 PM at the office of Dr. Bo Merino, [facility].   Please be advised, patients with office appointments requiring lab work will take precedents over walk-in lab work.  If possible, please come for your lab work on Monday and Friday afternoons, as you may experience shorter wait times. The office is located at [address], [place], [place], [place] [address] No appointment is necessary.   Labs are drawn by [facility]. Please bring your co-pay at the time of your lab draw.  You may receive a bill from [facility] for your lab work.  If you wish to have your labs drawn at another location, please call the office 24 hours in advance to send orders.  If you have any questions regarding directions or hours of operation,  please call [phone].   As a reminder, please drink plenty of water prior to coming for your lab work. Thanks!   Shoulder Exercises Ask your health care provider which exercises are safe for you. Do exercises exactly as told by your health care provider and adjust them as directed. It is normal to feel mild stretching, pulling, tightness, or discomfort as you do these exercises. Stop right away if you feel sudden pain or your pain gets  worse. Do not begin these exercises until told by your health care provider. Stretching exercises External rotation and abduction This exercise is sometimes  called corner stretch. This exercise rotates your arm outward (external rotation) and moves your arm out from your body (abduction). 1. Stand in a doorway with one of your feet slightly in front of the other. This is called a staggered stance. If you cannot reach your forearms to the door frame, stand facing a corner of a room. 2. Choose one of the following positions as told by your health care provider: ? Place your hands and forearms on the door frame above your head. ? Place your hands and forearms on the door frame at the height of your head. ? Place your hands on the door frame at the height of your elbows. 3. Slowly move your weight onto your front foot until you feel a stretch across your chest and in the front of your shoulders. Keep your head and chest upright and keep your abdominal muscles tight. 4. Hold for __________ seconds. 5. To release the stretch, shift your weight to your back foot. Repeat __________ times. Complete this exercise __________ times a day. Extension, standing 1. Stand and hold a broomstick, a cane, or a similar object behind your back. ? Your hands should be a little wider than shoulder width apart. ? Your palms should face away from your back. 2. Keeping your elbows straight and your shoulder muscles relaxed, move the stick away from your body until you feel a stretch in your shoulders (extension). ? Avoid shrugging your shoulders while you move the stick. Keep your shoulder blades tucked down toward the middle of your back. 3. Hold for __________ seconds. 4. Slowly return to the starting position. Repeat __________ times. Complete this exercise __________ times a day. Range-of-motion exercises Pendulum  1. Stand near a wall or a surface that you can hold onto for balance. 2. Bend at the waist and let your left / right arm hang straight down. Use your other arm to support you. Keep your back straight and do not lock your knees. 3. Relax your left / right arm  and shoulder muscles, and move your hips and your trunk so your left / right arm swings freely. Your arm should swing because of the motion of your body, not because you are using your arm or shoulder muscles. 4. Keep moving your hips and trunk so your arm swings in the following directions, as told by your health care provider: ? Side to side. ? Forward and backward. ? In clockwise and counterclockwise circles. 5. Continue each motion for __________ seconds, or for as long as told by your health care provider. 6. Slowly return to the starting position. Repeat __________ times. Complete this exercise __________ times a day. Shoulder flexion, standing  1. Stand and hold a broomstick, a cane, or a similar object. Place your hands a little more than shoulder width apart on the object. Your left / right hand should be palm up, and your other hand should be palm down. 2. Keep your elbow straight and your shoulder muscles relaxed. Push the stick up with your healthy arm to raise your left / right arm in front of your body, and then over your head until you feel a stretch in your shoulder (flexion). ? Avoid shrugging your shoulder while you raise your arm. Keep your shoulder blade tucked down toward the middle of your back. 3. Hold for __________ seconds. 4. Slowly return  to the starting position. Repeat __________ times. Complete this exercise __________ times a day. Shoulder abduction, standing 1. Stand and hold a broomstick, a cane, or a similar object. Place your hands a little more than shoulder width apart on the object. Your left / right hand should be palm up, and your other hand should be palm down. 2. Keep your elbow straight and your shoulder muscles relaxed. Push the object across your body toward your left / right side. Raise your left / right arm to the side of your body (abduction) until you feel a stretch in your shoulder. ? Do not raise your arm above shoulder height unless your health  care provider tells you to do that. ? If directed, raise your arm over your head. ? Avoid shrugging your shoulder while you raise your arm. Keep your shoulder blade tucked down toward the middle of your back. 3. Hold for __________ seconds. 4. Slowly return to the starting position. Repeat __________ times. Complete this exercise __________ times a day. Internal rotation  1. Place your left / right hand behind your back, palm up. 2. Use your other hand to dangle an exercise band, a towel, or a similar object over your shoulder. Grasp the band with your left / right hand so you are holding on to both ends. 3. Gently pull up on the band until you feel a stretch in the front of your left / right shoulder. The movement of your arm toward the center of your body is called internal rotation. ? Avoid shrugging your shoulder while you raise your arm. Keep your shoulder blade tucked down toward the middle of your back. 4. Hold for __________ seconds. 5. Release the stretch by letting go of the band and lowering your hands. Repeat __________ times. Complete this exercise __________ times a day. Strengthening exercises External rotation  1. Sit in a stable chair without armrests. 2. Secure an exercise band to a stable object at elbow height on your left / right side. 3. Place a soft object, such as a folded towel or a small pillow, between your left / right upper arm and your body to move your elbow about 4 inches (10 cm) away from your side. 4. Hold the end of the exercise band so it is tight and there is no slack. 5. Keeping your elbow pressed against the soft object, slowly move your forearm out, away from your abdomen (external rotation). Keep your body steady so only your forearm moves. 6. Hold for __________ seconds. 7. Slowly return to the starting position. Repeat __________ times. Complete this exercise __________ times a day. Shoulder abduction  1. Sit in a stable chair without armrests,  or stand up. 2. Hold a __________ weight in your left / right hand, or hold an exercise band with both hands. 3. Start with your arms straight down and your left / right palm facing in, toward your body. 4. Slowly lift your left / right hand out to your side (abduction). Do not lift your hand above shoulder height unless your health care provider tells you that this is safe. ? Keep your arms straight. ? Avoid shrugging your shoulder while you do this movement. Keep your shoulder blade tucked down toward the middle of your back. 5. Hold for __________ seconds. 6. Slowly lower your arm, and return to the starting position. Repeat __________ times. Complete this exercise __________ times a day. Shoulder extension 1. Sit in a stable chair without armrests, or stand up. 2.  Secure an exercise band to a stable object in front of you so it is at shoulder height. 3. Hold one end of the exercise band in each hand. Your palms should face each other. 4. Straighten your elbows and lift your hands up to shoulder height. 5. Step back, away from the secured end of the exercise band, until the band is tight and there is no slack. 6. Squeeze your shoulder blades together as you pull your hands down to the sides of your thighs (extension). Stop when your hands are straight down by your sides. Do not let your hands go behind your body. 7. Hold for __________ seconds. 8. Slowly return to the starting position. Repeat __________ times. Complete this exercise __________ times a day. Shoulder row 1. Sit in a stable chair without armrests, or stand up. 2. Secure an exercise band to a stable object in front of you so it is at waist height. 3. Hold one end of the exercise band in each hand. Position your palms so that your thumbs are facing the ceiling (neutral position). 4. Bend each of your elbows to a 90-degree angle (right angle) and keep your upper arms at your sides. 5. Step back until the band is tight and there  is no slack. 6. Slowly pull your elbows back behind you. 7. Hold for __________ seconds. 8. Slowly return to the starting position. Repeat __________ times. Complete this exercise __________ times a day. Shoulder press-ups  1. Sit in a stable chair that has armrests. Sit upright, with your feet flat on the floor. 2. Put your hands on the armrests so your elbows are bent and your fingers are pointing forward. Your hands should be about even with the sides of your body. 3. Push down on the armrests and use your arms to lift yourself off the chair. Straighten your elbows and lift yourself up as much as you comfortably can. ? Move your shoulder blades down, and avoid letting your shoulders move up toward your ears. ? Keep your feet on the ground. As you get stronger, your feet should support less of your body weight as you lift yourself up. 4. Hold for __________ seconds. 5. Slowly lower yourself back into the chair. Repeat __________ times. Complete this exercise __________ times a day. Wall push-ups  1. Stand so you are facing a stable wall. Your feet should be about one arm-length away from the wall. 2. Lean forward and place your palms on the wall at shoulder height. 3. Keep your feet flat on the floor as you bend your elbows and lean forward toward the wall. 4. Hold for __________ seconds. 5. Straighten your elbows to push yourself back to the starting position. Repeat __________ times. Complete this exercise __________ times a day. This information is not intended to replace advice given to you by your health care provider. Make sure you discuss any questions you have with your health care provider. Document Revised: 01/11/2019 Document Reviewed: 10/19/2018 Elsevier Patient Education  [place].  Hand Exercises Hand exercises can be helpful for almost anyone. These exercises can strengthen the hands, improve flexibility and movement, and increase blood flow to the hands. These  results can make work and daily tasks easier. Hand exercises can be especially helpful for people who have joint pain from arthritis or have nerve damage from overuse (carpal tunnel syndrome). These exercises can also help people who have injured a hand. Exercises Most of these hand exercises are gentle stretching and motion  exercises. It is usually safe to do them often throughout the day. Warming up your hands before exercise may help to reduce stiffness. You can do this with gentle massage or by placing your hands in warm water for 10-15 minutes. It is normal to feel some stretching, pulling, tightness, or mild discomfort as you begin new exercises. This will gradually improve. Stop an exercise right away if you feel sudden, severe pain or your pain gets worse. Ask your health care provider which exercises are best for you. Knuckle bend or "claw" fist 1. Stand or sit with your arm, hand, and all five fingers pointed straight up. Make sure to keep your wrist straight during the exercise. 2. Gently bend your fingers down toward your palm until the tips of your fingers are touching the top of your palm. Keep your big knuckle straight and just bend the small knuckles in your fingers. 3. Hold this position for __________ seconds. 4. Straighten (extend) your fingers back to the starting position. Repeat this exercise 5-10 times with each hand. Full finger fist 1. Stand or sit with your arm, hand, and all five fingers pointed straight up. Make sure to keep your wrist straight during the exercise. 2. Gently bend your fingers into your palm until the tips of your fingers are touching the middle of your palm. 3. Hold this position for __________ seconds. 4. Extend your fingers back to the starting position, stretching every joint fully. Repeat this exercise 5-10 times with each hand. Straight fist 1. Stand or sit with your arm, hand, and all five fingers pointed straight up. Make sure to keep your wrist  straight during the exercise. 2. Gently bend your fingers at the big knuckle, where your fingers meet your hand, and the middle knuckle. Keep the knuckle at the tips of your fingers straight and try to touch the bottom of your palm. 3. Hold this position for __________ seconds. 4. Extend your fingers back to the starting position, stretching every joint fully. Repeat this exercise 5-10 times with each hand. Tabletop 1. Stand or sit with your arm, hand, and all five fingers pointed straight up. Make sure to keep your wrist straight during the exercise. 2. Gently bend your fingers at the big knuckle, where your fingers meet your hand, as far down as you can while keeping the small knuckles in your fingers straight. Think of forming a tabletop with your fingers. 3. Hold this position for __________ seconds. 4. Extend your fingers back to the starting position, stretching every joint fully. Repeat this exercise 5-10 times with each hand. Finger spread 1. Place your hand flat on a table with your palm facing down. Make sure your wrist stays straight as you do this exercise. 2. Spread your fingers and thumb apart from each other as far as you can until you feel a gentle stretch. Hold this position for __________ seconds. 3. Bring your fingers and thumb tight together again. Hold this position for __________ seconds. Repeat this exercise 5-10 times with each hand. Making circles 1. Stand or sit with your arm, hand, and all five fingers pointed straight up. Make sure to keep your wrist straight during the exercise. 2. Make a circle by touching the tip of your thumb to the tip of your index finger. 3. Hold for __________ seconds. Then open your hand wide. 4. Repeat this motion with your thumb and each finger on your hand. Repeat this exercise 5-10 times with each hand. Thumb motion 1. Sit with  your forearm resting on a table and your wrist straight. Your thumb should be facing up toward the ceiling.  Keep your fingers relaxed as you move your thumb. 2. Lift your thumb up as high as you can toward the ceiling. Hold for __________ seconds. 3. Bend your thumb across your palm as far as you can, reaching the tip of your thumb for the small finger (pinkie) side of your palm. Hold for __________ seconds. Repeat this exercise 5-10 times with each hand. Grip strengthening  1. Hold a stress ball or other soft ball in the middle of your hand. 2. Slowly increase the pressure, squeezing the ball as much as you can without causing pain. Think of bringing the tips of your fingers into the middle of your palm. All of your finger joints should bend when doing this exercise. 3. Hold your squeeze for __________ seconds, then relax. Repeat this exercise 5-10 times with each hand. Contact a health care provider if:  Your hand pain or discomfort gets much worse when you do an exercise.  Your hand pain or discomfort does not improve within 2 hours after you exercise. If you have any of these problems, stop doing these exercises right away. Do not do them again unless your health care provider says that you can. Get help right away if:  You develop sudden, severe hand pain or swelling. If this happens, stop doing these exercises right away. Do not do them again unless your health care provider says that you can. This information is not intended to replace advice given to you by your health care provider. Make sure you discuss any questions you have with your health care provider. Document Revised: 01/10/2019 Document Reviewed: 09/20/2018 Elsevier Patient Education  [place].

## 2020-05-27 DIAGNOSIS — M9901 Segmental and somatic dysfunction of cervical region: Secondary | ICD-10-CM | POA: Diagnosis not present

## 2020-05-27 DIAGNOSIS — M9902 Segmental and somatic dysfunction of thoracic region: Secondary | ICD-10-CM | POA: Diagnosis not present

## 2020-05-27 DIAGNOSIS — M9903 Segmental and somatic dysfunction of lumbar region: Secondary | ICD-10-CM | POA: Diagnosis not present

## 2020-05-27 DIAGNOSIS — M62830 Muscle spasm of back: Secondary | ICD-10-CM | POA: Diagnosis not present

## 2020-05-27 DIAGNOSIS — M3500 Sicca syndrome, unspecified: Secondary | ICD-10-CM | POA: Diagnosis not present

## 2020-05-27 DIAGNOSIS — M9904 Segmental and somatic dysfunction of sacral region: Secondary | ICD-10-CM | POA: Diagnosis not present

## 2020-05-27 DIAGNOSIS — M0579 Rheumatoid arthritis with rheumatoid factor of multiple sites without organ or systems involvement: Secondary | ICD-10-CM | POA: Diagnosis not present

## 2020-05-27 DIAGNOSIS — M5386 Other specified dorsopathies, lumbar region: Secondary | ICD-10-CM | POA: Diagnosis not present

## 2020-05-27 MED FILL — SIMPONI 50 MG/0.5ML SOAJ: 50 | 30 days supply | Qty: 1 | Fill #2

## 2020-05-28 ENCOUNTER — Telehealth: Payer: Self-pay | Admitting: Rheumatology

## 2020-05-28 NOTE — Telephone Encounter (Signed)
Attempted to contact the patient and left message for patient to call the office.  

## 2020-05-28 NOTE — Telephone Encounter (Signed)
Patient is requesting a letter to give to HR which would abstain her from collecting Covid samples or being in a Covid patient room due to immunosuppressives. Patient is on Simponi.  Please advise.

## 2020-05-28 NOTE — Telephone Encounter (Signed)
OK to give the letter

## 2020-05-28 NOTE — Telephone Encounter (Signed)
Patient called stating she sent Dr. Estanislado Pandy an email requesting an accomodation letter.  Patient is requesting a return call.

## 2020-05-29 ENCOUNTER — Encounter: Payer: Self-pay | Admitting: *Deleted

## 2020-05-29 NOTE — Telephone Encounter (Signed)
Patient advised letter has been written and is ready for pick up.

## 2020-06-02 ENCOUNTER — Telehealth: Payer: Self-pay | Admitting: *Deleted

## 2020-06-02 NOTE — Telephone Encounter (Signed)
Attempted to contact the patient and left message for patient to call the office.  Calling to discuss [facility] labs with patient.

## 2020-06-04 NOTE — Telephone Encounter (Signed)
Patient advised RO and LA antibodies are negative. 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

## 2020-06-09 DIAGNOSIS — M9902 Segmental and somatic dysfunction of thoracic region: Secondary | ICD-10-CM | POA: Diagnosis not present

## 2020-06-09 DIAGNOSIS — M9904 Segmental and somatic dysfunction of sacral region: Secondary | ICD-10-CM | POA: Diagnosis not present

## 2020-06-09 DIAGNOSIS — M5386 Other specified dorsopathies, lumbar region: Secondary | ICD-10-CM | POA: Diagnosis not present

## 2020-06-09 DIAGNOSIS — M62830 Muscle spasm of back: Secondary | ICD-10-CM | POA: Diagnosis not present

## 2020-06-09 DIAGNOSIS — M9903 Segmental and somatic dysfunction of lumbar region: Secondary | ICD-10-CM | POA: Diagnosis not present

## 2020-06-09 DIAGNOSIS — M9901 Segmental and somatic dysfunction of cervical region: Secondary | ICD-10-CM | POA: Diagnosis not present

## 2020-06-24 ENCOUNTER — Telehealth: Payer: Self-pay | Admitting: Rheumatology

## 2020-06-24 NOTE — Telephone Encounter (Signed)
[employer] calling in reference to form submitted. Is there a duration of time that restrictions are effective? Or is this permanent? Please call to advise. Please call Venora Maples at [phone] Ext. [phone]

## 2020-06-24 NOTE — Telephone Encounter (Signed)
Left voicemail for Sharon Hunter at [phone] Ext. [phone] asking to return the call.

## 2020-06-24 NOTE — Telephone Encounter (Signed)
We can do a letter for 1 year.  Letter can be renewed after 1 year.

## 2020-06-24 NOTE — Telephone Encounter (Signed)
We gave patient a letter stating: Sharon Hunter is immunosuppressed. Sharon Hunter should refrain from collecting Covid samples. Sharon Hunter should not be assigned patients who are Covid postive.   [employer] would like to know how long this is effective or is it permanent. Please advise.

## 2020-06-25 ENCOUNTER — Telehealth: Payer: Self-pay | Admitting: Family Medicine

## 2020-06-25 NOTE — Telephone Encounter (Signed)
Caller : [facility]  Call Back # [phone]  Patient states she would like establish her mother with Dr Etter Sjogren. Patient's mother is moving here from [place].   Please Advise

## 2020-06-25 NOTE — Telephone Encounter (Signed)
Please advise 

## 2020-06-25 NOTE — Telephone Encounter (Signed)
Ok

## 2020-06-29 NOTE — Telephone Encounter (Signed)
Attempted to contact Venora Maples at [phone] Ext. [phone] and left message for him to call the office.

## 2020-06-30 DIAGNOSIS — M5386 Other specified dorsopathies, lumbar region: Secondary | ICD-10-CM | POA: Diagnosis not present

## 2020-06-30 DIAGNOSIS — M9901 Segmental and somatic dysfunction of cervical region: Secondary | ICD-10-CM | POA: Diagnosis not present

## 2020-06-30 DIAGNOSIS — M9902 Segmental and somatic dysfunction of thoracic region: Secondary | ICD-10-CM | POA: Diagnosis not present

## 2020-06-30 DIAGNOSIS — M9903 Segmental and somatic dysfunction of lumbar region: Secondary | ICD-10-CM | POA: Diagnosis not present

## 2020-06-30 DIAGNOSIS — M9904 Segmental and somatic dysfunction of sacral region: Secondary | ICD-10-CM | POA: Diagnosis not present

## 2020-06-30 DIAGNOSIS — M62830 Muscle spasm of back: Secondary | ICD-10-CM | POA: Diagnosis not present

## 2020-06-30 NOTE — Telephone Encounter (Signed)
Sharon Hunter returned call to the office and advised him the accomodation for the letter are for 1 year and then we will reassess. He will update patient's employer.

## 2020-07-02 MED FILL — SIMPONI 50 MG/0.5ML SOAJ: 50 | 30 days supply | Qty: 1 | Fill #0

## 2020-07-21 DIAGNOSIS — M62830 Muscle spasm of back: Secondary | ICD-10-CM | POA: Diagnosis not present

## 2020-07-21 DIAGNOSIS — M9904 Segmental and somatic dysfunction of sacral region: Secondary | ICD-10-CM | POA: Diagnosis not present

## 2020-07-21 DIAGNOSIS — M5386 Other specified dorsopathies, lumbar region: Secondary | ICD-10-CM | POA: Diagnosis not present

## 2020-07-21 DIAGNOSIS — M9902 Segmental and somatic dysfunction of thoracic region: Secondary | ICD-10-CM | POA: Diagnosis not present

## 2020-07-21 DIAGNOSIS — M9903 Segmental and somatic dysfunction of lumbar region: Secondary | ICD-10-CM | POA: Diagnosis not present

## 2020-07-21 DIAGNOSIS — M9901 Segmental and somatic dysfunction of cervical region: Secondary | ICD-10-CM | POA: Diagnosis not present

## 2020-08-10 DIAGNOSIS — M9904 Segmental and somatic dysfunction of sacral region: Secondary | ICD-10-CM | POA: Diagnosis not present

## 2020-08-10 DIAGNOSIS — M5386 Other specified dorsopathies, lumbar region: Secondary | ICD-10-CM | POA: Diagnosis not present

## 2020-08-10 DIAGNOSIS — M62830 Muscle spasm of back: Secondary | ICD-10-CM | POA: Diagnosis not present

## 2020-08-10 DIAGNOSIS — M9902 Segmental and somatic dysfunction of thoracic region: Secondary | ICD-10-CM | POA: Diagnosis not present

## 2020-08-10 DIAGNOSIS — M9901 Segmental and somatic dysfunction of cervical region: Secondary | ICD-10-CM | POA: Diagnosis not present

## 2020-08-10 DIAGNOSIS — M9903 Segmental and somatic dysfunction of lumbar region: Secondary | ICD-10-CM | POA: Diagnosis not present

## 2020-08-21 MED FILL — SIMPONI 50 MG/0.5ML SOAJ: 50 | 30 days supply | Qty: 1 | Fill #1

## 2020-09-04 ENCOUNTER — Encounter: Payer: 59 | Admitting: Family

## 2020-09-15 ENCOUNTER — Encounter: Payer: Self-pay | Admitting: Family

## 2020-09-15 ENCOUNTER — Other Ambulatory Visit: Payer: Self-pay | Admitting: Family

## 2020-09-15 ENCOUNTER — Other Ambulatory Visit (HOSPITAL_COMMUNITY)
Admission: RE | Admit: 2020-09-15 | Discharge: 2020-09-15 | Disposition: A | Discharge status: Home / Self Care | Payer: 59 | Source: Ambulatory Visit | Attending: Family | Admitting: Family

## 2020-09-15 ENCOUNTER — Other Ambulatory Visit: Payer: Self-pay

## 2020-09-15 ENCOUNTER — Ambulatory Visit (INDEPENDENT_AMBULATORY_CARE_PROVIDER_SITE_OTHER): Payer: 59 | Admitting: Family

## 2020-09-15 VITALS — BP 164/60 | HR 69 | Temp 98.2°F | Resp 16 | Ht 67.0 in | Wt 199.0 lb

## 2020-09-15 DIAGNOSIS — E785 Hyperlipidemia, unspecified: Secondary | ICD-10-CM

## 2020-09-15 DIAGNOSIS — R635 Abnormal weight gain: Secondary | ICD-10-CM

## 2020-09-15 DIAGNOSIS — I10 Essential (primary) hypertension: Secondary | ICD-10-CM

## 2020-09-15 DIAGNOSIS — Z01419 Encounter for gynecological examination (general) (routine) without abnormal findings: Secondary | ICD-10-CM | POA: Diagnosis not present

## 2020-09-15 DIAGNOSIS — F419 Anxiety disorder, unspecified: Secondary | ICD-10-CM | POA: Diagnosis not present

## 2020-09-15 DIAGNOSIS — Z0000 Encounter for general adult medical examination without abnormal findings: Secondary | ICD-10-CM

## 2020-09-15 DIAGNOSIS — L304 Erythema intertrigo: Secondary | ICD-10-CM | POA: Diagnosis not present

## 2020-09-15 DIAGNOSIS — E348 Other specified endocrine disorders: Secondary | ICD-10-CM | POA: Diagnosis not present

## 2020-09-15 MED ORDER — NYSTATIN 100000 UNIT/GM EX POWD: 1.0000 "application " | Freq: Two times a day (BID) | CUTANEOUS | 0 refills | Status: DC | PRN

## 2020-09-15 MED ORDER — LOSARTAN POTASSIUM 50 MG PO TABS: 50.0000 mg | ORAL_TABLET | Freq: Every day | ORAL | 0 refills | Status: DC

## 2020-09-15 MED ORDER — SERTRALINE HCL 50 MG PO TABS: ORAL_TABLET | ORAL | 0 refills | Status: DC

## 2020-09-15 MED FILL — NYSTATIN 100,000 UNIT/GM PO: 100000 | 20 days supply | Qty: 60 | Fill #0

## 2020-09-15 MED FILL — SERTRALINE HCL 50 MG TABLET: 50 | 30 days supply | Qty: 30 | Fill #0

## 2020-09-15 MED FILL — LOSARTAN POTASSIUM 50 MG TA: 50 | 90 days supply | Qty: 90 | Fill #0

## 2020-09-15 NOTE — Patient Instructions (Addendum)
Please send me a mychart message when you complete your labs with Dr. Estanislado Pandy in 1-2 weeks. Start zoloft 50mg  1/2 tab once daily for 1 week, then increase to a full tab once daily on week two. Start losartan 50mg  once daily.  Continue your work on healthy diet, exercise and weight loss.  Apply nystatin powder twice daily as needed to affected area.

## 2020-09-15 NOTE — Addendum Note (Signed)
Addended by: Jiles Prows on: 09/15/2020 04:21 PM   Modules accepted: Orders

## 2020-09-15 NOTE — Progress Notes (Signed)
Subjective:    Patient ID: Sharon Hunter, female    DOB: 1961/03/05, 60 y.o.   MRN: [id number]  HPI  Patient is a 59 yr old female who presents today for cpx.  Patient presents today for complete physical.  Immunizations: pfizer x 2, flu shot and tdap up to date. Due for shingrix and pfizer booster Diet: enjoys the keto diet but  Wt Readings from Last 3 Encounters:  09/15/20 199 lb (90.3 kg)  05/26/20 199 lb 3.2 oz (90.4 kg)  12/26/19 196 lb 3.2 oz (89 kg)  Exercise: plans to return to the gym Colonoscopy: due 2023 Dexa: due Pap Smear: due  Mammogram: 08/16/19  Elevated blood pressure reading-  BP Readings from Last 3 Encounters:  09/15/20 (!) 164/60  05/26/20 119/66  12/26/19 134/73   IBS- reports that she had a lot pain following 3 days of diarrhea.  Felt like she had a rash and used multiple creams- resolved with corn starch.  Notes are of irritation at the top of her gluteal fold.    She is off of estrogen.  She does note some vaginal dryness, but does is not currently sexually active.    She is requesting lab work.    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  She reports that her mom is in her own apartment but she recently moved her mom here from [place] after her father passed away and she is very stressed and anxious recently. Anxiety seems to be worse at night.     Review of Systems  Constitutional: Negative for unexpected weight change.  HENT: Negative for hearing loss and rhinorrhea.   Eyes: Negative for visual disturbance.  Respiratory: Negative for cough and shortness of breath.   Cardiovascular: Negative for chest pain.  Gastrointestinal: Positive for diarrhea (loose).  Genitourinary: Negative for dysuria and frequency.  Musculoskeletal: Positive for arthralgias.  Skin: Negative for rash.  Neurological: Negative for headaches.  Hematological: Negative for adenopathy.  Psychiatric/Behavioral:       See HPI   Past Medical History:  Diagnosis Date   . Anxiety   . Breast lump    stable-- gets annual mammogram  . 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 Hypertension   . IBS (irritable bowel syndrome) 08/01/2016  . Migraine   . Murmur 02/03/2011  . Ovarian failure   . Pulmonary nodule, left 2009   last CT Nov 2011-- due 08/2011  . Rheumatoid arthritis(714.0)    in remission , off meds as of Summer 2012     Social History   Socioeconomic History  . Marital status: Single    Spouse name: Not on file  . Number of children: 1  . Years of education: 57  . Highest education level: Not on file  Occupational History  . Not on file  Tobacco Use  . Smoking status: Former Smoker    Quit date: 10/03/1990    Years since quitting: 29.9  . Smokeless tobacco: Never Used  Vaping Use  . Vaping Use: Never used  Substance and Sexual Activity  . Alcohol use: Yes    Alcohol/week: 3.0 standard drinks    Types: 3 Standard drinks or equivalent per week    Comment: Red wine 4 oz 4-5 times a week  . Drug use: No  . Sexual activity: Not Currently    Partners: Male  Other Topics Concern  . Not on file  Social History Narrative   Exercise-- jogs   Social  Determinants of Health   Financial Resource Strain: Not on file  Food Insecurity: Not on file  Transportation Needs: Not on file  Physical Activity: Not on file  Stress: Not on file  Social Connections: Not on file  Intimate Partner Violence: Not on file    Past Surgical History:  Procedure Laterality Date  . CHOLECYSTECTOMY  1999  . COLONOSCOPY  10/31/11  . EXPLORATORY LAPAROTOMY    . TONSILLECTOMY  1969  . WISDOM TOOTH EXTRACTION      Family History  Problem Relation Age of Onset  . Alcohol abuse Father   . Hyperlipidemia Father   . Heart disease Father   . Alcohol abuse Brother   . Diabetes Brother   . Hypertension Brother   . Arthritis Mother   . Uterine cancer Mother   . Hypertension Mother   . Heart disease Sister        cardiac stent  . Diabetes Sister    . Hyperlipidemia Sister   . Hypertension Sister   . Cancer Brother        lung  . Cancer Maternal Aunt        lung----non smoker    Allergies  Allergen Reactions  . Other Swelling    Contrast dye- head swelling per pt.  . Contrast Media [Iodinated Diagnostic Agents]     Current Outpatient Medications on File Prior to Visit  Medication Sig Dispense Refill  . diclofenac sodium (VOLTAREN) 1 % GEL APPLY 3 GRAMS TO LARGE JOINTS THREE TIMES DAILY AS NEEDED FOR PAIN 300 g 3  . Doxylamine Succinate, Sleep, (UNISOM PO) Take 0.5 tablets by mouth at bedtime.    . Golimumab (SIMPONI) 50 MG/0.5ML SOAJ Inject 50 mg into the skin every 30 (thirty) days. 1.5 mL 0  . ibuprofen (ADVIL,MOTRIN) 400 MG tablet Take 400 mg by mouth every 6 (six) hours as needed.    Marland Kitchen Lifitegrast (XIIDRA) 5 % SOLN Apply to eye.    Marland Kitchen MELATONIN PO Take 5-10 mg by mouth at bedtime.     No current facility-administered medications on file prior to visit.    BP (!) 164/60 (BP Location: Right Arm, Patient Position: Sitting, Cuff Size: Small)   Pulse 69   Temp 98.2 F (36.8 C) (Oral)   Resp 16   Ht 5\' 7"  (1.702 m)   Wt 199 lb (90.3 kg)   LMP 10/03/2005   SpO2 100%   BMI 31.17 kg/m       Objective:   Physical Exam Physical Exam  Constitutional: She is oriented to person, place, and time. She appears well-developed and well-nourished. No distress.  HENT:  Head: Normocephalic and atraumatic.  Right Ear: Tympanic membrane and ear canal normal.  Left Ear: Tympanic membrane and ear canal normal.  Mouth/Throat: not examined- pt wearing mask Eyes: Pupils are equal, round, and reactive to light. No scleral icterus.  Neck: Normal range of motion. No thyromegaly present.  Cardiovascular: Normal rate and regular rhythm.   No murmur heard. Pulmonary/Chest: Effort normal and breath sounds normal. No respiratory distress. He has no wheezes. She has no rales. She exhibits no tenderness.  Abdominal: Soft. Bowel sounds are  normal. She exhibits no distension and no mass. There is no tenderness. There is no rebound and no guarding.  Musculoskeletal: She exhibits no edema.  Lymphadenopathy:    She has no cervical adenopathy.  Neurological: She is alert and oriented to person, place, and time. She has normal patellar reflexes. She exhibits normal muscle  tone. Coordination normal.  Skin: Skin is warm and dry.  Psychiatric: She has a normal mood and affect. Her behavior is normal. Judgment and thought content normal.  Breasts: Examined lying Right: Without masses, retractions, discharge or axillary adenopathy.  Left: Without masses, retractions, discharge or axillary adenopathy.  Inguinal/mons: Normal without inguinal adenopathy  External genitalia: Normal  BUS/Urethra/Skene's glands: Normal  Bladder: Normal  Vagina: Normal, dry vaginal mucosa.  Cervix: Normal  Uterus: normal in size, shape and contour. Midline and mobile  Adnexa/parametria:  Rt: Without masses or tenderness.  Lt: Without masses or tenderness.  Anus and perineum: irritation noted at the top of the gluteal fold.             Assessment & Plan:  Intertrigo- trial of nystatin powder.    HTN- uncontrolled. She has tolerated losartan in the past. Will restart losartan 50mg  once daily. She will obtain a follow up bmet at her rheumatology office in 1-2 weeks.  She will follow up with [place] in 3 weeks for a virtual visit with her bp reading.  Anxiety- uncontrolled.  She would like treatment for this.  Will initiate zoloft 50mg .  I instructed pt to start 1/2 tablet once daily for 1 week and then increase to a full tablet once daily on week two as tolerated.  We discussed common side effects such as nausea, drowsiness and weight gain.  Follow up in 3 weeks for virtual visit.  Preventative care- pap performed today with HPV co-testing. Recommended Replens vaginal moisturizer.  Will order mammo/dexa.  She is due for shingrix but states she has to come  off of her simponi first so she will discuss with her rheumatologist. We discussed healthy diet, exercise and weight loss.  Hyperlipidemia- obtain follow up lipid panel.   This visit occurred during the SARS-CoV-2 public health emergency.  Safety protocols were in place, including screening questions prior to the visit, additional usage of staff PPE, and extensive cleaning of exam room while observing appropriate contact time as indicated for disinfecting solutions.            Assessment & Plan:

## 2020-09-16 LAB — LIPID PANEL
Cholesterol: 270 mg/dL — ABNORMAL HIGH (ref 0–200)
HDL: 100.2 mg/dL (ref >39.00)
LDL Cholesterol: 147 mg/dL — ABNORMAL HIGH (ref 0–99)
NonHDL: 169.92
Total CHOL/HDL Ratio: 3
Triglycerides: 116 mg/dL (ref 0.0–149.0)
VLDL: 23.2 mg/dL (ref 0.0–40.0)

## 2020-09-16 LAB — TSH: TSH: 1.2 u[IU]/mL (ref 0.35–4.50)

## 2020-09-18 LAB — CYTOLOGY - PAP
Adequacy: ABSENT
Comment: NEGATIVE
Diagnosis: NEGATIVE
High risk HPV: NEGATIVE

## 2020-09-21 ENCOUNTER — Telehealth: Payer: Self-pay | Admitting: *Deleted

## 2020-09-21 NOTE — Telephone Encounter (Signed)
Reviewed by Dr. Estanislado Pandy   09/01/2020 TB Gold Negative.

## 2020-09-28 ENCOUNTER — Encounter: Payer: Self-pay | Admitting: Family

## 2020-09-28 ENCOUNTER — Other Ambulatory Visit: Payer: Self-pay | Admitting: *Deleted

## 2020-09-28 DIAGNOSIS — Z79899 Other long term (current) drug therapy: Secondary | ICD-10-CM

## 2020-09-28 LAB — COMPLETE METABOLIC PANEL WITH GFR
AG Ratio: 1.8 (calc) (ref 1.0–2.5)
ALT: 24 U/L (ref 6–29)
AST: 16 U/L (ref 10–35)
Albumin: 4.6 g/dL (ref 3.6–5.1)
Alkaline phosphatase (APISO): 70 U/L (ref 37–153)
BUN: 17 mg/dL (ref 7–25)
CO2: 26 mmol/L (ref 20–32)
Calcium: 9.7 mg/dL (ref 8.6–10.4)
Chloride: 102 mmol/L (ref 98–110)
Creat: 0.74 mg/dL (ref 0.50–1.05)
GFR, Est African American: 103 mL/min/{1.73_m2} (ref >=60)
GFR, Est Non African American: 89 mL/min/{1.73_m2} (ref >=60)
Globulin: 2.6 g/dL (calc) (ref 1.9–3.7)
Glucose, Bld: 125 mg/dL — ABNORMAL HIGH (ref 65–99)
Potassium: 4.3 mmol/L (ref 3.5–5.3)
Sodium: 136 mmol/L (ref 135–146)
Total Bilirubin: 0.5 mg/dL (ref 0.2–1.2)
Total Protein: 7.2 g/dL (ref 6.1–8.1)

## 2020-09-28 LAB — CBC WITH DIFFERENTIAL/PLATELET
Absolute Monocytes: 490 cells/uL (ref 200–950)
Basophils Absolute: 41 cells/uL (ref 0–200)
Basophils Relative: 0.6 %
Eosinophils Absolute: 152 cells/uL (ref 15–500)
Eosinophils Relative: 2.2 %
HCT: 42.4 % (ref 35.0–45.0)
Hemoglobin: 14.7 g/dL (ref 11.7–15.5)
Lymphs Abs: 2601 cells/uL (ref 850–3900)
MCH: 31.5 pg (ref 27.0–33.0)
MCHC: 34.7 g/dL (ref 32.0–36.0)
MCV: 90.8 fL (ref 80.0–100.0)
MPV: 10.9 fL (ref 7.5–12.5)
Monocytes Relative: 7.1 %
Neutro Abs: 3616 cells/uL (ref 1500–7800)
Neutrophils Relative %: 52.4 %
Platelets: 300 10*3/uL (ref 140–400)
RBC: 4.67 10*6/uL (ref 3.80–5.10)
RDW: 12.2 % (ref 11.0–15.0)
Total Lymphocyte: 37.7 %
WBC: 6.9 10*3/uL (ref 3.8–10.8)

## 2020-09-29 MED FILL — SIMPONI 50 MG/0.5ML SOAJ: 50 | 30 days supply | Qty: 1 | Fill #2

## 2020-09-29 NOTE — Progress Notes (Signed)
CBC is normal. CMP showed elevated glucose, probably not a fasting sample.

## 2020-10-09 ENCOUNTER — Ambulatory Visit: Payer: 59 | Admitting: Family

## 2020-10-09 ENCOUNTER — Encounter: Payer: Self-pay | Admitting: Family

## 2020-10-09 ENCOUNTER — Other Ambulatory Visit: Payer: Self-pay | Admitting: Family

## 2020-10-09 ENCOUNTER — Other Ambulatory Visit: Payer: Self-pay

## 2020-10-09 VITALS — BP 150/57 | HR 58 | Temp 98.1°F | Resp 16 | Ht 67.0 in | Wt 199.0 lb

## 2020-10-09 DIAGNOSIS — I10 Essential (primary) hypertension: Secondary | ICD-10-CM | POA: Diagnosis not present

## 2020-10-09 DIAGNOSIS — F419 Anxiety disorder, unspecified: Secondary | ICD-10-CM

## 2020-10-09 MED ORDER — SERTRALINE HCL 50 MG PO TABS: 75.0000 mg | ORAL_TABLET | Freq: Every day | ORAL | 0 refills | Status: DC

## 2020-10-09 MED ORDER — LOSARTAN POTASSIUM 50 MG PO TABS: 75.0000 mg | ORAL_TABLET | Freq: Every day | ORAL | 0 refills | Status: DC

## 2020-10-09 MED FILL — SERTRALINE HCL 50 MG TABLET: 50 | 90 days supply | Qty: 135 | Fill #0

## 2020-10-09 NOTE — Patient Instructions (Addendum)
Increase losartan to 75mg  once daily Increase zoloft to 75mg  once daily.

## 2020-10-09 NOTE — Progress Notes (Signed)
Subjective:    Patient ID: Sharon Hunter, female    DOB: 1961-06-21, 60 y.o.   MRN: [id number]  HPI  Patient is a 60 yr old female who presents today for follow up.   HTN- last visit bp was noted to be elevated. We restarted losartan 50mg  once daily.  BP Readings from Last 3 Encounters:  10/09/20 (!) 150/57  09/15/20 (!) 164/60  05/26/20 119/66   She has checked her bp several times- in the 140's  Anxiety- Last visit we initiated zoloft 50mg .  She has had 3 panic attacks since her last visit..   She is handling the stress much better at work but notes that she still has anxiety in the evenings.    Review of Systems See HPI  Past Medical History:  Diagnosis Date  . Anxiety   . Breast lump    stable-- gets annual mammogram  . 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 Hypertension   . IBS (irritable bowel syndrome) 08/01/2016  . Migraine   . Murmur 02/03/2011  . Ovarian failure   . Pulmonary nodule, left 2009   last CT Nov 2011-- due 08/2011  . Rheumatoid arthritis(714.0)    in remission , off meds as of Summer 2012     Social History   Socioeconomic History  . Marital status: Single    Spouse name: Not on file  . Number of children: 1  . Years of education: 51  . Highest education level: Not on file  Occupational History  . Not on file  Tobacco Use  . Smoking status: Former Smoker    Quit date: 10/03/1990    Years since quitting: 30.0  . Smokeless tobacco: Never Used  Vaping Use  . Vaping Use: Never used  Substance and Sexual Activity  . Alcohol use: Yes    Alcohol/week: 3.0 standard drinks    Types: 3 Standard drinks or equivalent per week    Comment: Red wine 4 oz 4-5 times a week  . Drug use: No  . Sexual activity: Not Currently    Partners: Male  Other Topics Concern  . Not on file  Social History Narrative   Exercise-- jogs   Social Determinants of Health   Financial Resource Strain: Not on file  Food Insecurity: Not on file   Transportation Needs: Not on file  Physical Activity: Not on file  Stress: Not on file  Social Connections: Not on file  Intimate Partner Violence: Not on file    Past Surgical History:  Procedure Laterality Date  . CHOLECYSTECTOMY  1999  . COLONOSCOPY  10/31/11  . EXPLORATORY LAPAROTOMY    . TONSILLECTOMY  1969  . WISDOM TOOTH EXTRACTION      Family History  Problem Relation Age of Onset  . Alcohol abuse Father   . Hyperlipidemia Father   . Heart disease Father   . Alcohol abuse Brother   . Diabetes Brother   . Hypertension Brother   . Arthritis Mother   . Uterine cancer Mother   . Hypertension Mother   . Heart disease Sister        cardiac stent  . Diabetes Sister   . Hyperlipidemia Sister   . Hypertension Sister   . Cancer Brother        lung  . Cancer Maternal Aunt        lung----non smoker    Allergies  Allergen Reactions  . Other Swelling    Contrast dye- head swelling  per pt.  . Contrast Media [Iodinated Diagnostic Agents]     Current Outpatient Medications on File Prior to Visit  Medication Sig Dispense Refill  . diclofenac sodium (VOLTAREN) 1 % GEL APPLY 3 GRAMS TO LARGE JOINTS THREE TIMES DAILY AS NEEDED FOR PAIN 300 g 3  . Doxylamine Succinate, Sleep, (UNISOM PO) Take 0.5 tablets by mouth at bedtime.    . Golimumab (SIMPONI) 50 MG/0.5ML SOAJ Inject 50 mg into the skin every 30 (thirty) days. 1.5 mL 0  . Lifitegrast (XIIDRA) 5 % SOLN Apply to eye.    . losartan (COZAAR) 50 MG tablet Take 1 tablet (50 mg total) by mouth daily. 90 tablet 0  . MELATONIN PO Take 5-10 mg by mouth at bedtime.    Marland Kitchen nystatin (MYCOSTATIN/NYSTOP) powder Apply 1 application topically 2 (two) times daily as needed. 60 g 0  . sertraline (ZOLOFT) 50 MG tablet 1/2 tab once daily for 1 week, then increase to a full tab once daily on week two 30 tablet 0   No current facility-administered medications on file prior to visit.    BP (!) 150/57 (BP Location: Right Arm, Patient  Position: Sitting, Cuff Size: Small)   Pulse (!) 58   Temp 98.1 F (36.7 C) (Oral)   Resp 16   Ht 5\' 7"  (1.702 m)   Wt 199 lb (90.3 kg)   LMP 10/03/2005   SpO2 99%   BMI 31.17 kg/m       Objective:   Physical Exam Constitutional:      Appearance: She is well-developed and well-nourished.  Neck:     Thyroid: No thyromegaly.  Cardiovascular:     Rate and Rhythm: Normal rate and regular rhythm.     Heart sounds: Normal heart sounds. No murmur heard.   Pulmonary:     Effort: Pulmonary effort is normal. No respiratory distress.     Breath sounds: Normal breath sounds. No wheezing.  Musculoskeletal:     Cervical back: Neck supple.  Skin:    General: Skin is warm and dry.  Neurological:     Mental Status: She is alert and oriented to person, place, and time.  Psychiatric:        Mood and Affect: Mood and affect normal.        Behavior: Behavior normal.        Thought Content: Thought content normal.        Judgment: Judgment normal.           Assessment & Plan:  HTN-  bp is improving but remains above goal. Will increase losartan from 50mg  to 75mg .   Anxiety- improving but not optimized.  Will increase zoloft from 50mg  to 75mg .  This visit occurred during the SARS-CoV-2 public health emergency.  Safety protocols were in place, including screening questions prior to the visit, additional usage of staff PPE, and extensive cleaning of exam room while observing appropriate contact time as indicated for disinfecting solutions.

## 2020-10-12 ENCOUNTER — Other Ambulatory Visit: Payer: Self-pay | Admitting: Family Medicine

## 2020-10-12 ENCOUNTER — Other Ambulatory Visit: Payer: Self-pay | Admitting: Family

## 2020-10-12 NOTE — Telephone Encounter (Signed)
Requesting:Zoloft 50mg  Contract:n/a UDS:n/a Last Visit:11/18/19 Next Visit:n/a Last Refill:10/09/20  Please Advise

## 2020-10-15 NOTE — Progress Notes (Signed)
Office Visit Note  Patient: Sharon Hunter             Date of Birth: 1960-10-23           MRN: [id number]             PCP: [name], DO Referring: [name], * Visit Date: 10/29/2020 Occupation: @GUAROCC @  Subjective:  Other (Bilateral shoulder pain, bilateral thumb pain )   History of Present Illness: Sharon Hunter is a 60 y.o. female history of rheumatoid arthritis and osteoarthritis.  She states she has been having increased discomfort in her shoulders.  She also complains of some discomfort in her bilateral hands especially her CMC joints.  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  She denies any joint swelling.  She has been tolerating Simponi without any problems.  Activities of Daily Living:  Patient reports morning stiffness for 4-6 hours.   Patient Denies nocturnal pain.  Difficulty dressing/grooming: Reports Difficulty climbing stairs: Denies Difficulty getting out of chair: Denies Difficulty using hands for taps, buttons, cutlery, and/or writing: Denies  Review of Systems  Constitutional: Positive for fatigue.  HENT: Positive for nose dryness. Negative for mouth sores and mouth dryness.   Eyes: Positive for dryness. Negative for pain and itching.  Respiratory: Negative for shortness of breath and difficulty breathing.   Cardiovascular: Negative for chest pain and palpitations.  Gastrointestinal: Negative for blood in stool, constipation and diarrhea.  Endocrine: Negative for increased urination.  Genitourinary: Negative for difficulty urinating.  Musculoskeletal: Positive for arthralgias, joint pain and morning stiffness. Negative for joint swelling, myalgias, muscle tenderness and myalgias.  Skin: Negative for color change, rash and redness.  Allergic/Immunologic: Negative for susceptible to infections.  Neurological: Negative for dizziness, numbness,  headaches, memory loss and weakness.  Hematological: Negative for bruising/bleeding tendency.  Psychiatric/Behavioral: Negative for confusion.    PMFS History:  Patient Active Problem List   Diagnosis Date Noted  . History of anxiety 03/27/2017  . History of hypertension 03/27/2017  . History of bundle branch block 03/27/2017  . Primary osteoarthritis of both hands 12/22/2016  . History of shingles 08/02/2016  . High risk medication use 08/02/2016  . Hypertension 08/01/2016  . IBS (irritable bowel syndrome) 08/01/2016  . Anxiety 08/01/2016  . 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 Rate-related bundle branch block 01/30/2015  . Palpitations 01/30/2015  . Pulmonary nodule 07/04/2011  . Murmur 02/03/2011  . Rheumatoid arthritis ([place]) 02/03/2011  . Hair loss 02/03/2011  . Fatigue 02/03/2011    Past Medical History:  Diagnosis Date  . Anxiety   . Breast lump    stable-- gets annual mammogram  . 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 Hypertension   . IBS (irritable bowel syndrome) 08/01/2016  . Migraine   . Murmur 02/03/2011  . Ovarian failure   . Pulmonary nodule, left 2009   last CT Nov 2011-- due 08/2011  . Rheumatoid arthritis(714.0)    in remission , off meds as of Summer 2012    Family History  Problem Relation Age of Onset  . Alcohol abuse Father   . Hyperlipidemia Father   . Heart disease Father   . Alcohol abuse Brother   . Diabetes Brother   . Hypertension Brother   . Arthritis Mother   . Uterine cancer Mother   . Hypertension Mother   .  Heart disease Sister        cardiac stent  . Diabetes Sister   . Hyperlipidemia Sister   . Hypertension Sister   . Cancer Brother        lung  . Cancer Maternal Aunt        lung----non smoker   Past Surgical History:  Procedure Laterality Date  . CHOLECYSTECTOMY  1999  . COLONOSCOPY  10/31/11  . EXPLORATORY LAPAROTOMY    . TONSILLECTOMY  1969  . WISDOM TOOTH EXTRACTION     Social History    Social History Narrative   Exercise-- jogs   Immunization History  Administered Date(s) Administered  . Influenza Split 07/03/2013  . Influenza,inj,Quad PF,6-35 Mos 07/17/2020  . Influenza-Unspecified 07/03/2014, 07/04/2015  . PFIZER(Purple Top)SARS-COV-2 Vaccination 01/13/2020, 05/15/2020  . Tdap 12/01/2009     Objective: Vital Signs: BP (!) 157/69 (BP Location: Left Arm, Patient Position: Sitting, Cuff Size: Normal)   Pulse 65   Resp 16   Ht 5\' 7"  (1.702 m)   Wt 196 lb (88.9 kg)   LMP 10/03/2005   BMI 30.70 kg/m    Physical Exam Vitals and nursing note reviewed.  Constitutional:      Appearance: She is well-developed and well-nourished.  HENT:     Head: Normocephalic and atraumatic.  Eyes:     Extraocular Movements: EOM normal.     Conjunctiva/sclera: Conjunctivae normal.  Cardiovascular:     Rate and Rhythm: Normal rate and regular rhythm.     Pulses: Intact distal pulses.     Heart sounds: Normal heart sounds.  Pulmonary:     Effort: Pulmonary effort is normal.     Breath sounds: Normal breath sounds.  Abdominal:     General: Bowel sounds are normal.     Palpations: Abdomen is soft.  Musculoskeletal:     Cervical back: Normal range of motion.  Lymphadenopathy:     Cervical: No cervical adenopathy.  Skin:    General: Skin is warm and dry.     Capillary Refill: Capillary refill takes less than 2 seconds.  Neurological:     Mental Status: She is alert and oriented to person, place, and time.  Psychiatric:        Mood and Affect: Mood and affect normal.        Behavior: Behavior normal.      Musculoskeletal Exam: C-spine thoracic and lumbar spine with good range of motion.  Shoulder joints, elbow joints, wrist joints, MCPs PIPs and DIPs with good range of motion.  She had bilateral CMC prominence, DIP and PIP thickening consistent with osteoarthritis.  Hip joints, knee joints, ankles with good range of motion.  She had no tenderness over ankles or  MTPs.  CDAI Exam: CDAI Score: 0.2  Patient Global: 1 mm; Provider Global: 1 mm Swollen: 0 ; Tender: 0  Joint Exam 10/29/2020   No joint exam has been documented for this visit   There is currently no information documented on the homunculus. Go to the Rheumatology activity and complete the homunculus joint exam.  Investigation: No additional findings.  Imaging: No results found.  Recent Labs: Lab Results  Component Value Date   WBC 6.9 09/28/2020   HGB 14.7 09/28/2020   PLT 300 09/28/2020   NA 136 09/28/2020   K 4.3 09/28/2020   CL 102 09/28/2020   CO2 26 09/28/2020   GLUCOSE 125 (H) 09/28/2020   BUN 17 09/28/2020   CREATININE 0.74 09/28/2020   BILITOT 0.5 09/28/2020  ALKPHOS 58 03/27/2017   AST 16 09/28/2020   ALT 24 09/28/2020   PROT 7.2 09/28/2020   ALBUMIN 4.3 03/27/2017   CALCIUM 9.7 09/28/2020   GFRAA 103 09/28/2020   QFTBGOLDPLUS NEGATIVE 09/12/2018    Speciality Comments: Prior therapy: methotrexate, remicade, orencia, and enbrel (inadequate response)  Procedures:  No procedures performed Allergies: Other and Contrast media [iodinated diagnostic agents]   Assessment / Plan:     Visit Diagnoses: Rheumatoid arthritis involving multiple sites with positive rheumatoid factor (HCC)-she had no synovitis on my examination today.  Her symptoms appears to be well controlled on Simponi subcu injections.  High risk medication use - Simponi 50 mg every 28 days TB gold September 01, 2020 was negative.  Labs in December 2021 were normal.  She will get labs every 3 months to monitor for drug toxicity.  She is fully vaccinated against COVID-19.  Use of third dose and a booster 6 months later was discussed.  Use of mask, social distancing and hand hygiene was discussed.  Primary osteoarthritis of both hands-she has bilateral DIP and PIP thickening.  She had bilateral CMC thickening which is causing discomfort.  Prescription for [facility] brace was given.  Joint protection was  discussed.  Other fatigue-she has been under a lot of stress.  Sicca syndrome (HCC)-she has been using over-the-counter products.  Other medical problems are listed as follows:  History of vitamin D deficiency  Pulmonary nodule  History of hypertension  History of anxiety  History of bundle branch block  Orders: No orders of the defined types were placed in this encounter.  No orders of the defined types were placed in this encounter.   Follow-Up Instructions: Return in about 5 months (around 03/29/2021) for Rheumatoid arthritis, Osteoarthritis.   Bo Merino, MD  Note - This record has been created using Editor, commissioning.  Chart creation errors have been sought, but may not always  have been located. Such creation errors do not reflect on  the standard of medical care.

## 2020-10-29 ENCOUNTER — Ambulatory Visit: Payer: 59 | Admitting: Rheumatology

## 2020-10-29 ENCOUNTER — Other Ambulatory Visit: Payer: Self-pay

## 2020-10-29 ENCOUNTER — Encounter: Payer: Self-pay | Admitting: Rheumatology

## 2020-10-29 VITALS — BP 157/69 | HR 65 | Resp 16 | Ht 67.0 in | Wt 196.0 lb

## 2020-10-29 DIAGNOSIS — Z8679 Personal history of other diseases of the circulatory system: Secondary | ICD-10-CM

## 2020-10-29 DIAGNOSIS — M0579 Rheumatoid arthritis with rheumatoid factor of multiple sites without organ or systems involvement: Secondary | ICD-10-CM | POA: Diagnosis not present

## 2020-10-29 DIAGNOSIS — Z8659 Personal history of other mental and behavioral disorders: Secondary | ICD-10-CM

## 2020-10-29 DIAGNOSIS — R5383 Other fatigue: Secondary | ICD-10-CM

## 2020-10-29 DIAGNOSIS — Z79899 Other long term (current) drug therapy: Secondary | ICD-10-CM

## 2020-10-29 DIAGNOSIS — M19042 Primary osteoarthritis, left hand: Secondary | ICD-10-CM

## 2020-10-29 DIAGNOSIS — Z8639 Personal history of other endocrine, nutritional and metabolic disease: Secondary | ICD-10-CM | POA: Diagnosis not present

## 2020-10-29 DIAGNOSIS — R911 Solitary pulmonary nodule: Secondary | ICD-10-CM | POA: Diagnosis not present

## 2020-10-29 DIAGNOSIS — M3500 Sicca syndrome, unspecified: Secondary | ICD-10-CM

## 2020-10-29 DIAGNOSIS — M19041 Primary osteoarthritis, right hand: Secondary | ICD-10-CM | POA: Diagnosis not present

## 2020-10-29 NOTE — Patient Instructions (Signed)
Standing Labs We placed an order today for your standing lab work.   Please have your standing labs drawn in March and every 3 months  If possible, please have your labs drawn 2 weeks prior to your appointment so that the provider can discuss your results at your appointment.  We have open lab daily Monday through Thursday from 8:30-12:30 PM and 1:30-4:30 PM and Friday from 8:30-12:30 PM and 1:30-4:00 PM at the office of Dr. Aliciana Ricciardi, [facility].   Please be advised, all patients with office appointments requiring lab work will take precedents over walk-in lab work.  If possible, please come for your lab work on Monday and Friday afternoons, as you may experience shorter wait times. The office is located at [address], [address], [place], [place] [address] No appointment is necessary.   Labs are drawn by [facility]. Please bring your co-pay at the time of your lab draw.  You may receive a bill from [facility] for your lab work.  If you wish to have your labs drawn at another location, please call the office 24 hours in advance to send orders.  If you have any questions regarding directions or hours of operation,  please call [phone].   As a reminder, please drink plenty of water prior to coming for your lab work. Thanks!  

## 2020-11-04 ENCOUNTER — Encounter: Payer: Self-pay | Admitting: Family Medicine

## 2020-11-04 ENCOUNTER — Other Ambulatory Visit: Payer: Self-pay | Admitting: Family Medicine

## 2020-11-04 ENCOUNTER — Other Ambulatory Visit: Payer: Self-pay

## 2020-11-04 ENCOUNTER — Telehealth (INDEPENDENT_AMBULATORY_CARE_PROVIDER_SITE_OTHER): Payer: 59 | Admitting: Family Medicine

## 2020-11-04 VITALS — BP 127/77 | HR 60 | Wt 199.0 lb

## 2020-11-04 DIAGNOSIS — I10 Essential (primary) hypertension: Secondary | ICD-10-CM | POA: Diagnosis not present

## 2020-11-04 DIAGNOSIS — F419 Anxiety disorder, unspecified: Secondary | ICD-10-CM | POA: Diagnosis not present

## 2020-11-04 MED ORDER — LOSARTAN POTASSIUM 50 MG PO TABS: 75.0000 mg | ORAL_TABLET | Freq: Every day | ORAL | 1 refills | Status: DC

## 2020-11-04 MED ORDER — SERTRALINE HCL 50 MG PO TABS: ORAL_TABLET | ORAL | 3 refills | Status: DC

## 2020-11-04 NOTE — Progress Notes (Signed)
Virtual Visit via Video Note  I connected with Sharon Hunter on 11/04/20 at  8:40 AM EST by a video enabled telemedicine application and verified that I am speaking with the correct person using two identifiers.  Location/ participants  Patient: home alone Provider: home    I discussed the limitations of evaluation and management by telemedicine and the availability of in person appointments. The patient expressed understanding and agreed to proceed.  History of Present Illness: Pt f/u bp and anxiety.  She is doing better with both.  No complaints    Observations/Objective: Vitals:   11/04/20 0837  BP: 127/77  Pulse: 60   Pt is in nad   Assessment and Plan: 1. Primary hypertension Well controlled, no changes to meds. Encouraged heart healthy diet such as the DASH diet and exercise as tolerated.  - losartan (COZAAR) 50 MG tablet; Take 1.5 tablets (75 mg total) by mouth daily.  Dispense: 135 tablet; Refill: 1  2. Anxiety Controlled con't zoloft  F/u 3 months - sertraline (ZOLOFT) 50 MG tablet; 1 , 5 po qd  Dispense: 135 tablet; Refill: 3   Follow Up Instructions:    I discussed the assessment and treatment plan with the patient. The patient was provided an opportunity to ask questions and all were answered. The patient agreed with the plan and demonstrated an understanding of the instructions.   The patient was advised to call back or seek an in-person evaluation if the symptoms worsen or if the condition fails to improve as anticipated   [name], DO

## 2020-11-11 ENCOUNTER — Ambulatory Visit: Payer: 59 | Admitting: Family Medicine

## 2020-11-11 ENCOUNTER — Other Ambulatory Visit: Payer: Self-pay

## 2020-11-11 ENCOUNTER — Other Ambulatory Visit (HOSPITAL_COMMUNITY): Payer: Self-pay | Admitting: Family Medicine

## 2020-11-11 ENCOUNTER — Encounter: Payer: Self-pay | Admitting: Family Medicine

## 2020-11-11 VITALS — BP 134/68 | HR 80 | Temp 98.1°F | Ht 67.0 in | Wt 201.2 lb

## 2020-11-11 DIAGNOSIS — R070 Pain in throat: Secondary | ICD-10-CM

## 2020-11-11 DIAGNOSIS — H6981 Other specified disorders of Eustachian tube, right ear: Secondary | ICD-10-CM | POA: Diagnosis not present

## 2020-11-11 MED ORDER — MOMETASONE FUROATE 50 MCG/ACT NA SUSP: 2.0000 | Freq: Every day | NASAL | 12 refills | Status: DC

## 2020-11-11 MED FILL — MOMETASONE FUROATE 50 MCG S: 50 | 30 days supply | Qty: 17 | Fill #0

## 2020-11-11 NOTE — Progress Notes (Signed)
Chief Complaint  Patient presents with  . Ear Pain    Pt is here for right ear pain. Duration: 3 weeks. Progression: worse  Did hear some sloshing.  Associated symptoms: swollen gland, strange feeling in throat Denies: sore throat, congestion, post nasal drip, ear pressure, dental pain, fevers, non productive cough, bleeding, or discharge from ear Treatment to date: none  Past Medical History:  Diagnosis Date  . Anxiety   . Breast lump    stable-- gets annual mammogram  . 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 Hypertension   . IBS (irritable bowel syndrome) 08/01/2016  . Migraine   . Murmur 02/03/2011  . Ovarian failure   . Pulmonary nodule, left 2009   last CT Nov 2011-- due 08/2011  . Rheumatoid arthritis(714.0)    in remission , off meds as of Summer 2012    BP 134/68 (BP Location: Left Arm, Patient Position: Sitting, Cuff Size: Normal)   Pulse 80   Temp 98.1 F (36.7 C) (Oral)   Ht 5\' 7"  (1.702 m)   Wt 201 lb 4 oz (91.3 kg)   LMP 10/03/2005   SpO2 96%   BMI 31.52 kg/m  General: Awake, alert, appearing stated age HEENT:  L ear- Canal patent without drainage or erythema, TM is negative R ear- canal patent without drainage or erythema, TM is slightly retracted without erythema, bulging, fluid, or purulence Nose- nares patent and without discharge Mouth- Lips, gums and dentition unremarkable, pharynx is without erythema or exudate Neck: No adenopathy Cardiac: RRR, no bruits Lungs: Normal effort, no accessory muscle use Psych: Age appropriate judgment and insight, normal mood and affect  Dysfunction of right eustachian tube - Plan: mometasone (NASONEX) 50 MCG/ACT nasal spray  Throat discomfort - Plan: Ambulatory referral to ENT  Go back on intranasal corticosteroid.  I cannot explain her throat discomfort but we will refer her to ENT for possible nasopharyngoscopy.  If she improves with the intranasal corticosteroid, can cancel the appointment with  ENT. F/u prn.  Pt voiced understanding and agreement to the plan.  [place], DO 11/11/20 3:01 PM

## 2020-11-11 NOTE — Patient Instructions (Addendum)
Go back on the Nasonex and schedule an appointment with your ENT. Cancel the appointment if you are improving.  If you do not hear anything about your referral in the next 1-2 weeks, call our office and ask for an update.  Let us know if you need anything.

## 2020-11-16 ENCOUNTER — Other Ambulatory Visit: Payer: Self-pay

## 2020-11-16 NOTE — Telephone Encounter (Signed)
Will forward to correct provider

## 2020-11-17 NOTE — Telephone Encounter (Signed)
Requested through the wrong office

## 2020-11-17 NOTE — Telephone Encounter (Signed)
I don't believe we were prescribing this for the pt---- who was writing this for her?

## 2020-11-19 ENCOUNTER — Other Ambulatory Visit: Payer: Self-pay | Admitting: Pharmacist

## 2020-11-19 ENCOUNTER — Other Ambulatory Visit: Payer: Self-pay | Admitting: Physician Assistant

## 2020-11-19 MED ORDER — SIMPONI 50 MG/0.5ML ~~LOC~~ SOAJ: SUBCUTANEOUS | 2 refills | Status: DC

## 2020-11-19 NOTE — Telephone Encounter (Signed)
Has the patient updated TB gold?

## 2020-11-19 NOTE — Telephone Encounter (Addendum)
Last Visit: 10/29/2020 Next Visit: 04/01/2021 Labs: 09/28/2020, CBC is normal. CMP showed elevated glucose, probably not a fasting sample. TB Gold: 09/01/2020 TB Gold Negative.   Current Dose per office note 10/29/2020, Simponi 50 mg every 28 days   TM:YTRZNBVAPO arthritis involving multiple sites with positive rheumatoid factor  Last Fill: 04/14/2020  Okay to refill Simponi?

## 2020-11-20 MED FILL — SIMPONI 50 MG/0.5ML SOAJ: 50 | 30 days supply | Qty: 1 | Fill #0

## 2020-11-23 ENCOUNTER — Other Ambulatory Visit: Payer: Self-pay | Admitting: Family

## 2020-11-23 DIAGNOSIS — I10 Essential (primary) hypertension: Secondary | ICD-10-CM

## 2020-11-25 MED FILL — LOSARTAN POTASSIUM 50 MG TA: 50 | 30 days supply | Qty: 45 | Fill #0

## 2020-12-03 ENCOUNTER — Other Ambulatory Visit: Payer: Self-pay

## 2020-12-03 ENCOUNTER — Ambulatory Visit (HOSPITAL_BASED_OUTPATIENT_CLINIC_OR_DEPARTMENT_OTHER)
Admission: RE | Admit: 2020-12-03 | Discharge: 2020-12-03 | Disposition: A | Discharge status: Home / Self Care | Payer: 59 | Source: Ambulatory Visit | Attending: Family | Admitting: Family

## 2020-12-03 DIAGNOSIS — Z1231 Encounter for screening mammogram for malignant neoplasm of breast: Secondary | ICD-10-CM | POA: Diagnosis not present

## 2020-12-03 DIAGNOSIS — Z0000 Encounter for general adult medical examination without abnormal findings: Secondary | ICD-10-CM

## 2020-12-03 DIAGNOSIS — E348 Other specified endocrine disorders: Secondary | ICD-10-CM

## 2020-12-03 DIAGNOSIS — M8589 Other specified disorders of bone density and structure, multiple sites: Secondary | ICD-10-CM | POA: Diagnosis not present

## 2020-12-03 DIAGNOSIS — Z780 Asymptomatic menopausal state: Secondary | ICD-10-CM | POA: Diagnosis not present

## 2020-12-07 ENCOUNTER — Encounter: Payer: Self-pay | Admitting: Family Medicine

## 2020-12-07 NOTE — Telephone Encounter (Signed)
She should be taking 1200 mg calcium and vita d3 1000u daily She can get them in gummy form  + osteoporosis-- she should also have fosamax 70 mg weekly #12 3 refills or we can see if her ins will pay for evenity or prolia (injections) Recheck 2 years

## 2020-12-08 ENCOUNTER — Ambulatory Visit (INDEPENDENT_AMBULATORY_CARE_PROVIDER_SITE_OTHER): Payer: 59 | Admitting: Otolaryngology

## 2020-12-08 ENCOUNTER — Other Ambulatory Visit: Payer: Self-pay | Admitting: Family Medicine

## 2020-12-08 ENCOUNTER — Other Ambulatory Visit: Payer: Self-pay

## 2020-12-08 DIAGNOSIS — H6123 Impacted cerumen, bilateral: Secondary | ICD-10-CM

## 2020-12-08 DIAGNOSIS — H6981 Other specified disorders of Eustachian tube, right ear: Secondary | ICD-10-CM

## 2020-12-08 DIAGNOSIS — H6991 Unspecified Eustachian tube disorder, right ear: Secondary | ICD-10-CM

## 2020-12-08 DIAGNOSIS — J029 Acute pharyngitis, unspecified: Secondary | ICD-10-CM | POA: Diagnosis not present

## 2020-12-08 MED ORDER — ALENDRONATE SODIUM 70 MG PO TABS: 70.0000 mg | ORAL_TABLET | ORAL | 2 refills | Status: DC

## 2020-12-08 MED FILL — ALENDRONATE NA 70 MG TAB: 70 | 84 days supply | Qty: 12 | Fill #0

## 2020-12-08 NOTE — Progress Notes (Signed)
HPI: Sharon Hunter is a 60 y.o. female who presents is referred by her PCP Dr. Etter Sjogren For evaluation of right-sided throat and ear discomfort.  This initially started about 6 or 8 weeks ago with a right earache and a sensation of closing of the right ear.  This got better but then she had some throat symptoms where she felt like she had a swelling or discomfort in the throat and points to the area of the right side of her larynx.  She was initially diagnosed with eustachian tube dysfunction and treated with Nasacort.  She denies any throat pain but has a sensation of something abnormal in her throat on the right side.  Of note she has a strong gag reflex. She is status post tonsillectomy when she was younger..  Past Medical History:  Diagnosis Date  . Anxiety   . Breast lump    stable-- gets annual mammogram  . 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 Hypertension   . IBS (irritable bowel syndrome) 08/01/2016  . Migraine   . Murmur 02/03/2011  . Ovarian failure   . Pulmonary nodule, left 2009   last CT Nov 2011-- due 08/2011  . Rheumatoid arthritis(714.0)    in remission , off meds as of Summer 2012   Past Surgical History:  Procedure Laterality Date  . CHOLECYSTECTOMY  1999  . COLONOSCOPY  10/31/11  . EXPLORATORY LAPAROTOMY    . TONSILLECTOMY  1969  . WISDOM TOOTH EXTRACTION     Social History   Socioeconomic History  . Marital status: Single    Spouse name: Not on file  . Number of children: 1  . Years of education: 57  . Highest education level: Not on file  Occupational History  . Not on file  Tobacco Use  . Smoking status: Former Smoker    Quit date: 10/03/1990    Years since quitting: 30.2  . Smokeless tobacco: Never Used  Vaping Use  . Vaping Use: Never used  Substance and Sexual Activity  . Alcohol use: Yes    Alcohol/week: 3.0 standard drinks    Types: 3 Standard drinks or equivalent per week    Comment: Red wine 4 oz 4-5 times a week  . Drug use: No  .  Sexual activity: Not Currently    Partners: Male  Other Topics Concern  . Not on file  Social History Narrative   Exercise-- jogs   Social Determinants of Health   Financial Resource Strain: Not on file  Food Insecurity: Not on file  Transportation Needs: Not on file  Physical Activity: Not on file  Stress: Not on file  Social Connections: Not on file   Family History  Problem Relation Age of Onset  . Alcohol abuse Father   . Hyperlipidemia Father   . Heart disease Father   . Alcohol abuse Brother   . Diabetes Brother   . Hypertension Brother   . Arthritis Mother   . Uterine cancer Mother   . Hypertension Mother   . Heart disease Sister        cardiac stent  . Diabetes Sister   . Hyperlipidemia Sister   . Hypertension Sister   . Cancer Brother        lung  . Cancer Maternal Aunt        lung----non smoker   Allergies  Allergen Reactions  . Other Swelling    Contrast dye- head swelling per pt.  . Contrast Media [Iodinated Diagnostic Agents]  Prior to Admission medications   Medication Sig Start Date End Date Taking? Authorizing Provider  alendronate (FOSAMAX) 70 MG tablet Take 1 tablet (70 mg total) by mouth every 7 (seven) days. Take with a full glass of water on an empty stomach. 12/08/20   Roma Schanz R, DO  diclofenac sodium (VOLTAREN) 1 % GEL APPLY 3 GRAMS TO LARGE JOINTS THREE TIMES DAILY AS NEEDED FOR PAIN 02/15/17   Bo Merino, MD  Doxylamine Succinate, Sleep, (UNISOM PO) Take 0.5 tablets by mouth at bedtime.    [provider]  Golimumab (SIMPONI) 50 MG/0.5ML SOAJ INJECT 50 MG INTO THE SKIN EVERY 30 DAYS 11/19/20   Tresa Garter, MD  Lifitegrast Shirley Friar) 5 % SOLN Apply to eye.    [provider]  losartan (COZAAR) 50 MG tablet TAKE 1 TABLET (50 MG TOTAL) BY MOUTH DAILY. 11/23/20   Debbrah Alar, NP  MELATONIN PO Take 5-10 mg by mouth at bedtime.    [provider]  mometasone (NASONEX) 50 MCG/ACT nasal  spray Place 2 sprays into the nose daily. 11/11/20   Shelda Pal, DO  nystatin (MYCOSTATIN/NYSTOP) powder Apply 1 application topically 2 (two) times daily as needed. 09/15/20   Debbrah Alar, NP  sertraline (ZOLOFT) 50 MG tablet 1 , 5 po qd 11/04/20   Carollee Herter, Kendrick Fries R, DO     Positive ROS: Otherwise negative  All other systems have been reviewed and were otherwise negative with the exception of those mentioned in the HPI and as above.  Physical Exam: Constitutional: Alert, well-appearing, no acute distress Ears: External ears without lesions or tenderness.  She had a large amount of wax in the left ear canal completely occluding the left ear canal that was removed in the office today using forceps.  She had a small amount of wax in the right ear canal that was also cleaned with forceps and a curette.  The TMs were clear bilaterally with good mobility pneumatic otoscopy.  No middle ear effusion noted.  On hearing screening with the 512 1024 tuning fork she heard about the same in both ears with AC > BC bilaterally. Nasal: External nose without lesions. Septum with minimal deformity and moderate rhinitis.  After decongesting the nose nasal passages were clear bilaterally.. Oral: Lips and gums without lesions. Tongue and palate mucosa without lesions. Posterior oropharynx clear.  She is status post tonsillectomy but has some small residual inferior tonsillar tissue but no signs of infection or exudate.  Indirect laryngoscopy was difficult because of strong gag reflex. Fiberoptic laryngoscopy was performed to the right nostril.  The nasopharynx was clear.  Eustachian tube region was clear.  The base of tongue had prominent lingual tonsillar tissue but no exudate.  The vallecula epiglottis were normal.  Piriform sinuses were clear bilaterally.  Vocal cords were clear with normal vocal mobility. Neck: No palpable adenopathy or masses.  No palpable adenopathy on the right side of the  neck. Respiratory: Breathing comfortably  Skin: No facial/neck lesions or rash noted.  Laryngoscopy  Date/Time: 12/08/2020 3:27 PM Performed by: Rozetta Nunnery, MD Authorized by: Rozetta Nunnery, MD   Consent:    Consent obtained:  Verbal   Consent given by:  Patient Procedure details:    Indications: direct visualization of the upper aerodigestive tract and difficult indirect mirror examination     Medication:  Afrin   Instrument: flexible fiberoptic laryngoscope     Scope location: right nare   Sinus:  Right nasopharynx: normal     Right Eustachian tube orifices: normal   Mouth:    Oropharynx: normal     Vallecula: normal     Base of tongue: normal     Epiglottis: normal   Throat:    True vocal cords: normal   Comments:     On fiberoptic laryngoscopy the hypopharynx and larynx was clear to evaluation.    Assessment: Right ear and throat symptoms with essentially normal examination of the nasopharynx and eustachian tube as well as the hypopharynx and larynx.  Plan: Agree with use of the Nasacort as this will help with nasal symptoms as well as eustachian tube problems although I did not identify any eustachian tube problems of any significance in the office today. Suggested gargling with antiseptic such as Listerine or scope a couple times a day for 5 to 6 days to see if this helps at all with the throat symptoms.  Reassured her of normal throat examination otherwise.   Radene Journey, MD   CC:

## 2020-12-21 ENCOUNTER — Other Ambulatory Visit: Payer: Self-pay

## 2020-12-21 ENCOUNTER — Ambulatory Visit: Payer: 59 | Attending: Family Medicine | Admitting: Pharmacist

## 2020-12-21 DIAGNOSIS — Z79899 Other long term (current) drug therapy: Secondary | ICD-10-CM

## 2020-12-21 NOTE — Progress Notes (Signed)
S: Patient is currently taking Simponi for rheumatoid arthritis. Patient is managed by [facility] for this.   Adherence: denies any missed doses recently   Efficacy: continues to work well for her.  Dosing:  SubQ: 50 mg once a month   Drug-drug interactions: none  Screening: TB test: completed yearly at work Hepatitis: completed  Monitoring: S/sx of infection: denies CBC: WNL  S/sx of hypersensitivity: denies S/sx of malignancy: denies S/sx of heart failure: denies S/sx of autoimmune disorder: denies   O:     Lab Results  Component Value Date   WBC 6.9 09/28/2020   HGB 14.7 09/28/2020   HCT 42.4 09/28/2020   MCV 90.8 09/28/2020   PLT 300 09/28/2020      Chemistry      Component Value Date/Time   NA 136 09/28/2020 1352   NA 138 02/04/2016 0000   K 4.3 09/28/2020 1352   CL 102 09/28/2020 1352   CO2 26 09/28/2020 1352   BUN 17 09/28/2020 1352   BUN 20 02/04/2016 0000   CREATININE 0.74 09/28/2020 1352   GLU 88 02/04/2016 0000      Component Value Date/Time   CALCIUM 9.7 09/28/2020 1352   ALKPHOS 58 03/27/2017 1341   AST 16 09/28/2020 1352   ALT 24 09/28/2020 1352   BILITOT 0.5 09/28/2020 1352       A/P: 1. Medication review: Patient tolerating Simponi well with no adverse effects. Reviewed the medication with the patient, including the following: Simponi, golimumab, is a TNF blocker. There is an increased risk of infection and malignancy with this medication. Do not give patients live vaccinations while they are on this medication. No changes recommended at this time.   Benard Halsted, PharmD, [place], [place] [phone]

## 2020-12-24 MED FILL — LOSARTAN POTASSIUM 50 MG TA: 50 | 30 days supply | Qty: 45 | Fill #1

## 2020-12-25 ENCOUNTER — Other Ambulatory Visit (HOSPITAL_COMMUNITY): Payer: Self-pay

## 2021-01-02 ENCOUNTER — Other Ambulatory Visit (HOSPITAL_COMMUNITY): Payer: Self-pay

## 2021-01-07 ENCOUNTER — Other Ambulatory Visit: Payer: Self-pay | Admitting: *Deleted

## 2021-01-07 DIAGNOSIS — Z79899 Other long term (current) drug therapy: Secondary | ICD-10-CM

## 2021-01-08 ENCOUNTER — Other Ambulatory Visit: Payer: Self-pay | Admitting: *Deleted

## 2021-01-08 ENCOUNTER — Other Ambulatory Visit (HOSPITAL_COMMUNITY): Payer: Self-pay

## 2021-01-08 ENCOUNTER — Other Ambulatory Visit: Payer: Self-pay | Admitting: Pharmacist

## 2021-01-08 LAB — CBC WITH DIFFERENTIAL/PLATELET
Absolute Monocytes: 525 cells/uL (ref 200–950)
Basophils Absolute: 38 cells/uL (ref 0–200)
Basophils Relative: 0.5 %
Eosinophils Absolute: 188 cells/uL (ref 15–500)
Eosinophils Relative: 2.5 %
HCT: 39.3 % (ref 35.0–45.0)
Hemoglobin: 13.1 g/dL (ref 11.7–15.5)
Lymphs Abs: 3008 cells/uL (ref 850–3900)
MCH: 30.7 pg (ref 27.0–33.0)
MCHC: 33.3 g/dL (ref 32.0–36.0)
MCV: 92 fL (ref 80.0–100.0)
MPV: 10.8 fL (ref 7.5–12.5)
Monocytes Relative: 7 %
Neutro Abs: 3743 cells/uL (ref 1500–7800)
Neutrophils Relative %: 49.9 %
Platelets: 261 10*3/uL (ref 140–400)
RBC: 4.27 10*6/uL (ref 3.80–5.10)
RDW: 12.5 % (ref 11.0–15.0)
Total Lymphocyte: 40.1 %
WBC: 7.5 10*3/uL (ref 3.8–10.8)

## 2021-01-08 LAB — COMPLETE METABOLIC PANEL WITH GFR
AG Ratio: 1.6 (calc) (ref 1.0–2.5)
ALT: 15 U/L (ref 6–29)
AST: 11 U/L (ref 10–35)
Albumin: 4.4 g/dL (ref 3.6–5.1)
Alkaline phosphatase (APISO): 69 U/L (ref 37–153)
BUN: 22 mg/dL (ref 7–25)
CO2: 29 mmol/L (ref 20–32)
Calcium: 9.9 mg/dL (ref 8.6–10.4)
Chloride: 103 mmol/L (ref 98–110)
Creat: 0.86 mg/dL (ref 0.50–1.05)
GFR, Est African American: 86 mL/min/{1.73_m2} (ref >=60)
GFR, Est Non African American: 74 mL/min/{1.73_m2} (ref >=60)
Globulin: 2.7 g/dL (calc) (ref 1.9–3.7)
Glucose, Bld: 111 mg/dL — ABNORMAL HIGH (ref 65–99)
Potassium: 4.2 mmol/L (ref 3.5–5.3)
Sodium: 140 mmol/L (ref 135–146)
Total Bilirubin: 0.4 mg/dL (ref 0.2–1.2)
Total Protein: 7.1 g/dL (ref 6.1–8.1)

## 2021-01-08 MED ORDER — GOLIMUMAB 50 MG/0.5ML ~~LOC~~ SOAJ
SUBCUTANEOUS | 2 refills | Status: DC
  Filled 2021-01-08: qty 0.5, fill #0

## 2021-01-08 MED ORDER — GOLIMUMAB 50 MG/0.5ML ~~LOC~~ SOAJ
SUBCUTANEOUS | 2 refills | Status: DC
  Filled 2021-01-08: qty 0.5, fill #0
  Filled 2021-01-11: qty 0.5, 30d supply, fill #0
  Filled 2021-02-18: qty 0.5, 30d supply, fill #1
  Filled 2021-03-18: qty 0.5, 30d supply, fill #2

## 2021-01-08 NOTE — Telephone Encounter (Signed)
Next Visit: 04/01/2021  Last Visit: 10/29/2020  Last Fill: 11/19/2020  DX: Rheumatoid arthritis involving multiple sites with positive rheumatoid factor   Current Dose per office note 10/29/2020, Simponi 50 mg every 28 days   Labs: 01/07/2021, Glucose is 111. Rest of CMP WNL. CBC WNL.   TB Gold: 09/01/2020, negative  Okay to refill Simponi?

## 2021-01-11 ENCOUNTER — Other Ambulatory Visit (HOSPITAL_COMMUNITY): Payer: Self-pay

## 2021-01-12 ENCOUNTER — Other Ambulatory Visit (HOSPITAL_COMMUNITY): Payer: Self-pay

## 2021-01-15 ENCOUNTER — Other Ambulatory Visit (HOSPITAL_COMMUNITY): Payer: Self-pay

## 2021-01-16 ENCOUNTER — Encounter: Payer: Self-pay | Admitting: Family Medicine

## 2021-01-19 ENCOUNTER — Other Ambulatory Visit (HOSPITAL_COMMUNITY): Payer: Self-pay

## 2021-01-19 MED FILL — Losartan Potassium Tab 50 MG: ORAL | 90 days supply | Qty: 135 | Fill #0 | Status: AC

## 2021-01-20 ENCOUNTER — Other Ambulatory Visit (HOSPITAL_COMMUNITY): Payer: Self-pay

## 2021-01-20 MED FILL — Sertraline HCl Tab 50 MG: ORAL | 90 days supply | Qty: 135 | Fill #0 | Status: AC

## 2021-01-27 ENCOUNTER — Other Ambulatory Visit: Payer: Self-pay

## 2021-01-27 ENCOUNTER — Ambulatory Visit: Payer: 59 | Admitting: Physician Assistant

## 2021-01-27 ENCOUNTER — Encounter: Payer: Self-pay | Admitting: Physician Assistant

## 2021-01-27 ENCOUNTER — Ambulatory Visit: Payer: Self-pay

## 2021-01-27 VITALS — BP 142/84 | HR 60 | Resp 15 | Ht 67.0 in | Wt 207.0 lb

## 2021-01-27 DIAGNOSIS — M25511 Pain in right shoulder: Secondary | ICD-10-CM | POA: Diagnosis not present

## 2021-01-27 DIAGNOSIS — Z79899 Other long term (current) drug therapy: Secondary | ICD-10-CM | POA: Diagnosis not present

## 2021-01-27 DIAGNOSIS — M3500 Sicca syndrome, unspecified: Secondary | ICD-10-CM

## 2021-01-27 DIAGNOSIS — M19041 Primary osteoarthritis, right hand: Secondary | ICD-10-CM

## 2021-01-27 DIAGNOSIS — Z8659 Personal history of other mental and behavioral disorders: Secondary | ICD-10-CM

## 2021-01-27 DIAGNOSIS — R5383 Other fatigue: Secondary | ICD-10-CM | POA: Diagnosis not present

## 2021-01-27 DIAGNOSIS — G8929 Other chronic pain: Secondary | ICD-10-CM

## 2021-01-27 DIAGNOSIS — Z8639 Personal history of other endocrine, nutritional and metabolic disease: Secondary | ICD-10-CM

## 2021-01-27 DIAGNOSIS — R911 Solitary pulmonary nodule: Secondary | ICD-10-CM

## 2021-01-27 DIAGNOSIS — M0579 Rheumatoid arthritis with rheumatoid factor of multiple sites without organ or systems involvement: Secondary | ICD-10-CM | POA: Diagnosis not present

## 2021-01-27 DIAGNOSIS — M19042 Primary osteoarthritis, left hand: Secondary | ICD-10-CM

## 2021-01-27 DIAGNOSIS — Z8679 Personal history of other diseases of the circulatory system: Secondary | ICD-10-CM

## 2021-01-27 MED ORDER — LIDOCAINE HCL 1 % IJ SOLN
1.5000 mL | INTRAMUSCULAR | Status: AC | PRN
  Administered 2021-01-27: 1.5 mL

## 2021-01-27 MED ORDER — TRIAMCINOLONE ACETONIDE 40 MG/ML IJ SUSP
40.0000 mg | INTRAMUSCULAR | Status: AC | PRN
  Administered 2021-01-27: 40 mg via INTRA_ARTICULAR

## 2021-01-27 NOTE — Progress Notes (Signed)
Office Visit Note  Patient: Sharon Hunter             Date of Birth: 08-28-1961           MRN: [id number]             PCP: [name], DO Referring: [name], * Visit Date: 01/27/2021 Occupation: @GUAROCC @  Subjective:  Right shoulder pain   History of Present Illness: Sharon Hunter is a 60 y.o. female with history of rheumatoid arthritis. She is on Simponi 50 mg sq injections every 28 days.  She reports that she has occasionally able to space the dosing to every 5 weeks.  She states that over the past 6 months she has been experiencing intermittent pain in both shoulder joints.  She states over the past 1 month she has had progressively worsening pain in her right shoulder.  She states that couple days ago she slipped on her deck and then started to have severe nocturnal pain in the right shoulder last night.  She states the pain was an 8 out of 10.  She tried taking Motrin as well as a hot shower without any relief.  She states that she has a history of bursitis and the pain feels similar to what it did in the past.  She reports she continues to have intermittent CMC joint pain bilaterally.  She denies any joint swelling currently.  She states that she has been using a Psychologist, occupational which has been helpful.  She denies any other new concerns.      Activities of Daily Living:  Patient reports morning stiffness for 0 minutes.   Patient Reports nocturnal pain.  Difficulty dressing/grooming: Reports Difficulty climbing stairs: Denies Difficulty getting out of chair: Denies Difficulty using hands for taps, buttons, cutlery, and/or writing: Denies  Review of Systems  Constitutional: Positive for fatigue.  HENT: Positive for mouth dryness and nose dryness. Negative for mouth sores.   Eyes: Positive for dryness. Negative for pain and itching.  Respiratory: Negative for shortness of breath and difficulty breathing.   Cardiovascular: Negative for chest pain and  palpitations.  Gastrointestinal: Negative for blood in stool, constipation and diarrhea.  Endocrine: Negative for increased urination.  Genitourinary: Negative for difficulty urinating.  Musculoskeletal: Positive for arthralgias, joint pain, myalgias, muscle tenderness and myalgias. Negative for joint swelling and morning stiffness.  Skin: Negative for color change, rash and redness.  Allergic/Immunologic: Negative for susceptible to infections.  Neurological: Negative for dizziness, numbness, headaches, memory loss and weakness.  Hematological: Negative for bruising/bleeding tendency.  Psychiatric/Behavioral: Negative for confusion.    PMFS History:  Patient Active Problem List   Diagnosis Date Noted  . History of anxiety 03/27/2017  . History of hypertension 03/27/2017  . History of bundle branch block 03/27/2017  . Primary osteoarthritis of both hands 12/22/2016  . History of shingles 08/02/2016  . High risk medication use 08/02/2016  . Hypertension 08/01/2016  . IBS (irritable bowel syndrome) 08/01/2016  . Anxiety 08/01/2016  . 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 Rate-related bundle branch block 01/30/2015  . Palpitations 01/30/2015  . Pulmonary nodule 07/04/2011  . Murmur 02/03/2011  . Rheumatoid arthritis ([place]) 02/03/2011  . Hair loss 02/03/2011  . Fatigue 02/03/2011    Past Medical History:  Diagnosis Date  . Anxiety   . Breast lump    stable-- gets annual mammogram  . 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 Hypertension   .  IBS (irritable bowel syndrome) 08/01/2016  . Migraine   . Murmur 02/03/2011  . Ovarian failure   . Pulmonary nodule, left 2009   last CT Nov 2011-- due 08/2011  . Rheumatoid arthritis(714.0)    in remission , off meds as of Summer 2012    Family History  Problem Relation Age of Onset  . Alcohol abuse Father   . Hyperlipidemia Father   . Heart disease Father   . Alcohol abuse Brother   . Diabetes Brother   .  Hypertension Brother   . Arthritis Mother   . Uterine cancer Mother   . Hypertension Mother   . Heart disease Sister        cardiac stent  . Diabetes Sister   . Hyperlipidemia Sister   . Hypertension Sister   . Cancer Brother        lung  . Cancer Maternal Aunt        lung----non smoker   Past Surgical History:  Procedure Laterality Date  . CHOLECYSTECTOMY  1999  . COLONOSCOPY  10/31/11  . EXPLORATORY LAPAROTOMY    . TONSILLECTOMY  1969  . WISDOM TOOTH EXTRACTION     Social History   Social History Narrative   Exercise-- jogs   Immunization History  Administered Date(s) Administered  . Influenza Split 07/03/2013  . Influenza,inj,Quad PF,6-35 Mos 07/17/2020  . Influenza-Unspecified 07/03/2014, 07/04/2015  . PFIZER(Purple Top)SARS-COV-2 Vaccination 01/13/2020, 05/15/2020  . Tdap 12/01/2009     Objective: Vital Signs: BP (!) 142/84 (BP Location: Left Arm, Patient Position: Sitting, Cuff Size: Normal)   Pulse 60   Resp 15   Ht 5\' 7"  (1.702 m)   Wt 207 lb (93.9 kg)   LMP 10/03/2005   BMI 32.42 kg/m    Physical Exam Vitals and nursing note reviewed.  Constitutional:      Appearance: She is well-developed.  HENT:     Head: Normocephalic and atraumatic.  Eyes:     Conjunctiva/sclera: Conjunctivae normal.  Pulmonary:     Effort: Pulmonary effort is normal.  Abdominal:     Palpations: Abdomen is soft.  Musculoskeletal:     Cervical back: Normal range of motion.  Skin:    General: Skin is warm and dry.     Capillary Refill: Capillary refill takes less than 2 seconds.  Neurological:     Mental Status: She is alert and oriented to person, place, and time.  Psychiatric:        Behavior: Behavior normal.      Musculoskeletal Exam: C-spine, thoracic spine, lumbar spine good range of motion with no discomfort.  Left shoulder has good range of motion with some discomfort.  Extremely limited active and passive range of motion of the right shoulder.  Tenderness  palpation over the subacromial space.  Elbow joints, wrist joints, MCPs, PIPs, DIPs have good range of motion with no synovitis.  Tenderness over bilateral CMC joints.  She was able to make a complete fist bilaterally.  Mild PIP and DIP thickening consistent with osteoarthritis of both hands noted.  Hip joints have good range of motion with no discomfort.  Knee joints have good range of motion with no warmth or effusion.  Ankle joints have good range of motion with no tenderness or inflammation.  Bunions noted bilaterally.  No tenderness over MTP joints.   CDAI Exam: CDAI Score: -- Patient Global: --; Provider Global: -- Swollen: --; Tender: -- Joint Exam 01/27/2021   No joint exam has been documented for this  visit   There is currently no information documented on the homunculus. Go to the Rheumatology activity and complete the homunculus joint exam.  Investigation: No additional findings.  Imaging: No results found.  Recent Labs: Lab Results  Component Value Date   WBC 7.5 01/07/2021   HGB 13.1 01/07/2021   PLT 261 01/07/2021   NA 140 01/07/2021   K 4.2 01/07/2021   CL 103 01/07/2021   CO2 29 01/07/2021   GLUCOSE 111 (H) 01/07/2021   BUN 22 01/07/2021   CREATININE 0.86 01/07/2021   BILITOT 0.4 01/07/2021   ALKPHOS 58 03/27/2017   AST 11 01/07/2021   ALT 15 01/07/2021   PROT 7.1 01/07/2021   ALBUMIN 4.3 03/27/2017   CALCIUM 9.9 01/07/2021   GFRAA 86 01/07/2021   QFTBGOLDPLUS NEGATIVE 09/12/2018    Speciality Comments: Prior therapy: methotrexate, remicade, orencia, and enbrel (inadequate response)  Procedures:  Large Joint Inj on 01/27/2021 10:12 AM Indications: pain Details: 27 G 1.5 in needle, posterior approach  Arthrogram: No  Medications: 1.5 mL lidocaine 1 %; 40 mg triamcinolone acetonide 40 MG/ML Aspirate: 0 mL Outcome: tolerated well, no immediate complications Procedure, treatment alternatives, risks and benefits explained, specific risks discussed.  Consent was given by the patient. Immediately prior to procedure a time out was called to verify the correct patient, procedure, equipment, support staff and site/side marked as required. Patient was prepped and draped in the usual sterile fashion.     Allergies: Other and Contrast media [iodinated diagnostic agents]   Assessment / Plan:     Visit Diagnoses: Rheumatoid arthritis involving multiple sites with positive rheumatoid factor ([place]): She has no synovitis on exam.  She is clinically doing well on Simponi 50 mg subcutaneous injections every 4-5 weeks.  She has been able to space the doses of Simponi without any recent flares.  She has been experiencing intermittent pain in both shoulders for the past 6 months.  The right shoulder has progressively been more painful over the past 1 month.  Last night she woke up at 2 AM with severe pain in the right shoulder which was an 8 out of 10 on the pain scale.  X-rays of the right shoulder were obtained today.  After informed consent the right subacromial bursa was injected with cortisone.  She tolerated procedure well.  She was advised to notify us if her discomfort persists or worsens.  She will continue on Simponi as prescribed.  She will follow-up in the office in 5 months.  High risk medication use - Simponi 50 mg every 28 days.  CBC and CMP updated on 01/07/21.  She will be due to update lab work in July and every 3 months.  Standing orders for CBC and CMP remain in place.  TB gold negative on 09/01/20.  She has had 2 Pfizer COVID-19 vaccine doses.  Chronic right shoulder pain - She presents today with acute on chronic pain in the right shoulder joint.  X-rays of the right shoulder were obtained.  After informed consent the right subacromial bursa was injected with cortisone.  She tolerated procedure well.  Procedure note was completed above.  Aftercare was discussed.  She was advised to notify us if her right shoulder joint pain persists or worsens.   Plan: XR Shoulder Right, Large Joint Inj  Primary osteoarthritis of both hands: She has mild PIP and DIP thickening consistent with osteoarthritis of both hands.  She experiences intermittent pain in bilateral CMC joints.  Tenderness ovation over both  CMC joints noted on exam.  We discussed the importance of joint protection and muscle strengthening.  Other fatigue: Stable.  She remains active.  She plans on starting back at the gym soon.  Sicca syndrome [facility]): She uses eyedrops eyedrops as needed to alleviate her symptoms of dry eyes.  History of vitamin D deficiency: She takes a calcium and vitamin D supplement on a daily basis.  Other medical conditions are listed as follows:  History of anxiety  History of hypertension: Advised to monitor her blood pressure closely following the cortisone injection today.  Pulmonary nodule  History of bundle branch block    Orders: Orders Placed This Encounter  Procedures  . Large Joint Inj  . XR Shoulder Right   No orders of the defined types were placed in this encounter.    Follow-Up Instructions: Return in about 5 months (around 06/29/2021) for Rheumatoid arthritis.   Ofilia Neas, PA-C  Note - This record has been created using Dragon software.  Chart creation errors have been sought, but may not always  have been located. Such creation errors do not reflect on  the standard of medical care.

## 2021-02-03 ENCOUNTER — Other Ambulatory Visit (HOSPITAL_COMMUNITY): Payer: Self-pay

## 2021-02-05 ENCOUNTER — Ambulatory Visit: Payer: 59 | Admitting: Family Medicine

## 2021-02-10 ENCOUNTER — Other Ambulatory Visit (HOSPITAL_COMMUNITY): Payer: Self-pay

## 2021-02-12 ENCOUNTER — Other Ambulatory Visit (HOSPITAL_COMMUNITY): Payer: Self-pay

## 2021-02-18 ENCOUNTER — Other Ambulatory Visit (HOSPITAL_COMMUNITY): Payer: Self-pay

## 2021-03-02 ENCOUNTER — Telehealth: Payer: Self-pay

## 2021-03-02 DIAGNOSIS — M9903 Segmental and somatic dysfunction of lumbar region: Secondary | ICD-10-CM | POA: Diagnosis not present

## 2021-03-02 DIAGNOSIS — M9902 Segmental and somatic dysfunction of thoracic region: Secondary | ICD-10-CM | POA: Diagnosis not present

## 2021-03-02 DIAGNOSIS — M5386 Other specified dorsopathies, lumbar region: Secondary | ICD-10-CM | POA: Diagnosis not present

## 2021-03-02 DIAGNOSIS — M62830 Muscle spasm of back: Secondary | ICD-10-CM | POA: Diagnosis not present

## 2021-03-02 DIAGNOSIS — M9905 Segmental and somatic dysfunction of pelvic region: Secondary | ICD-10-CM | POA: Diagnosis not present

## 2021-03-02 NOTE — Telephone Encounter (Signed)
Submitted a Prior Authorization request to [facility] for [facility] via CoverMyMeds. Will update once we receive a response.   Key: [id number]

## 2021-03-04 DIAGNOSIS — M9903 Segmental and somatic dysfunction of lumbar region: Secondary | ICD-10-CM | POA: Diagnosis not present

## 2021-03-04 DIAGNOSIS — M5386 Other specified dorsopathies, lumbar region: Secondary | ICD-10-CM | POA: Diagnosis not present

## 2021-03-04 DIAGNOSIS — M62830 Muscle spasm of back: Secondary | ICD-10-CM | POA: Diagnosis not present

## 2021-03-04 DIAGNOSIS — M9905 Segmental and somatic dysfunction of pelvic region: Secondary | ICD-10-CM | POA: Diagnosis not present

## 2021-03-04 DIAGNOSIS — M9902 Segmental and somatic dysfunction of thoracic region: Secondary | ICD-10-CM | POA: Diagnosis not present

## 2021-03-05 NOTE — Telephone Encounter (Signed)
Request automatically canceled by CoverMyMeds. It is possible that we may need to wait until previous PA becomes inactive before resubmitting (03/13/21). Will reschedule for f/u.

## 2021-03-05 NOTE — Telephone Encounter (Signed)
Received notification from [facility] regarding a prior authorization for [facility]. Authorization has been APPROVED from 03/04/21 to 03/03/22.   Authorization # [id number]  She can continue filling through [place], PharmD, MPH Clinical Pharmacist (Rheumatology and Pulmonology)

## 2021-03-15 ENCOUNTER — Other Ambulatory Visit (HOSPITAL_COMMUNITY): Payer: Self-pay

## 2021-03-15 DIAGNOSIS — M5386 Other specified dorsopathies, lumbar region: Secondary | ICD-10-CM | POA: Diagnosis not present

## 2021-03-15 DIAGNOSIS — M62830 Muscle spasm of back: Secondary | ICD-10-CM | POA: Diagnosis not present

## 2021-03-15 DIAGNOSIS — M9905 Segmental and somatic dysfunction of pelvic region: Secondary | ICD-10-CM | POA: Diagnosis not present

## 2021-03-15 DIAGNOSIS — M9903 Segmental and somatic dysfunction of lumbar region: Secondary | ICD-10-CM | POA: Diagnosis not present

## 2021-03-15 DIAGNOSIS — M9902 Segmental and somatic dysfunction of thoracic region: Secondary | ICD-10-CM | POA: Diagnosis not present

## 2021-03-16 ENCOUNTER — Other Ambulatory Visit (HOSPITAL_COMMUNITY): Payer: Self-pay

## 2021-03-17 ENCOUNTER — Other Ambulatory Visit (HOSPITAL_COMMUNITY): Payer: Self-pay

## 2021-03-17 DIAGNOSIS — M9903 Segmental and somatic dysfunction of lumbar region: Secondary | ICD-10-CM | POA: Diagnosis not present

## 2021-03-17 DIAGNOSIS — M62830 Muscle spasm of back: Secondary | ICD-10-CM | POA: Diagnosis not present

## 2021-03-17 DIAGNOSIS — M5386 Other specified dorsopathies, lumbar region: Secondary | ICD-10-CM | POA: Diagnosis not present

## 2021-03-17 DIAGNOSIS — M9902 Segmental and somatic dysfunction of thoracic region: Secondary | ICD-10-CM | POA: Diagnosis not present

## 2021-03-17 DIAGNOSIS — M9905 Segmental and somatic dysfunction of pelvic region: Secondary | ICD-10-CM | POA: Diagnosis not present

## 2021-03-18 ENCOUNTER — Other Ambulatory Visit (HOSPITAL_COMMUNITY): Payer: Self-pay

## 2021-03-24 DIAGNOSIS — M5386 Other specified dorsopathies, lumbar region: Secondary | ICD-10-CM | POA: Diagnosis not present

## 2021-03-24 DIAGNOSIS — M62830 Muscle spasm of back: Secondary | ICD-10-CM | POA: Diagnosis not present

## 2021-03-24 DIAGNOSIS — M9902 Segmental and somatic dysfunction of thoracic region: Secondary | ICD-10-CM | POA: Diagnosis not present

## 2021-03-24 DIAGNOSIS — M9905 Segmental and somatic dysfunction of pelvic region: Secondary | ICD-10-CM | POA: Diagnosis not present

## 2021-03-24 DIAGNOSIS — M9903 Segmental and somatic dysfunction of lumbar region: Secondary | ICD-10-CM | POA: Diagnosis not present

## 2021-04-01 ENCOUNTER — Ambulatory Visit: Payer: 59 | Admitting: Physician Assistant

## 2021-04-06 ENCOUNTER — Other Ambulatory Visit (HOSPITAL_COMMUNITY): Payer: Self-pay

## 2021-04-12 ENCOUNTER — Other Ambulatory Visit (HOSPITAL_COMMUNITY): Payer: Self-pay

## 2021-04-14 ENCOUNTER — Other Ambulatory Visit (HOSPITAL_COMMUNITY): Payer: Self-pay

## 2021-04-14 ENCOUNTER — Other Ambulatory Visit: Payer: Self-pay | Admitting: Physician Assistant

## 2021-04-14 ENCOUNTER — Other Ambulatory Visit: Payer: Self-pay | Admitting: Pharmacist

## 2021-04-14 MED ORDER — GOLIMUMAB 50 MG/0.5ML ~~LOC~~ SOAJ
SUBCUTANEOUS | 0 refills | Status: DC
  Filled 2021-04-14: qty 0.5, 30d supply, fill #0

## 2021-04-14 MED ORDER — GOLIMUMAB 50 MG/0.5ML ~~LOC~~ SOAJ
SUBCUTANEOUS | 0 refills | Status: DC
  Filled 2021-04-14: qty 0.5, fill #0

## 2021-04-14 NOTE — Telephone Encounter (Signed)
Next Visit: November 2022. Message sent to the front to schedule.   Last Visit: 01/27/2021  Last Fill: 01/08/2021  [id number] arthritis involving multiple sites with positive rheumatoid factor  Current Dose per office note 01/27/2021: Simponi 50 mg every 28 days  Labs: 01/07/2021 Glucose is 111.  Rest of CMP WNL. CBC WNL.  TB Gold: 09/01/2020  Neg   Patient aware she is due for labs. Patient states she will come in towards the end of the month. Patient states her mother is having surgery and she will be caring for her mother.   Okay to refill Simponi?

## 2021-05-02 MED FILL — Losartan Potassium Tab 50 MG: ORAL | 90 days supply | Qty: 135 | Fill #1 | Status: CN

## 2021-05-02 MED FILL — Sertraline HCl Tab 50 MG: ORAL | 90 days supply | Qty: 135 | Fill #1 | Status: AC

## 2021-05-03 ENCOUNTER — Other Ambulatory Visit: Payer: Self-pay | Admitting: *Deleted

## 2021-05-03 ENCOUNTER — Other Ambulatory Visit (HOSPITAL_COMMUNITY): Payer: Self-pay

## 2021-05-03 ENCOUNTER — Other Ambulatory Visit: Payer: Self-pay | Admitting: Family Medicine

## 2021-05-03 DIAGNOSIS — I10 Essential (primary) hypertension: Secondary | ICD-10-CM

## 2021-05-03 DIAGNOSIS — M9902 Segmental and somatic dysfunction of thoracic region: Secondary | ICD-10-CM | POA: Diagnosis not present

## 2021-05-03 DIAGNOSIS — M9905 Segmental and somatic dysfunction of pelvic region: Secondary | ICD-10-CM | POA: Diagnosis not present

## 2021-05-03 DIAGNOSIS — Z79899 Other long term (current) drug therapy: Secondary | ICD-10-CM

## 2021-05-03 DIAGNOSIS — M9903 Segmental and somatic dysfunction of lumbar region: Secondary | ICD-10-CM | POA: Diagnosis not present

## 2021-05-03 DIAGNOSIS — M0579 Rheumatoid arthritis with rheumatoid factor of multiple sites without organ or systems involvement: Secondary | ICD-10-CM

## 2021-05-03 DIAGNOSIS — M62830 Muscle spasm of back: Secondary | ICD-10-CM | POA: Diagnosis not present

## 2021-05-03 DIAGNOSIS — M5386 Other specified dorsopathies, lumbar region: Secondary | ICD-10-CM | POA: Diagnosis not present

## 2021-05-03 LAB — COMPLETE METABOLIC PANEL WITH GFR
AG Ratio: 1.8 (calc) (ref 1.0–2.5)
ALT: 16 U/L (ref 6–29)
AST: 12 U/L (ref 10–35)
Albumin: 4.4 g/dL (ref 3.6–5.1)
Alkaline phosphatase (APISO): 59 U/L (ref 37–153)
BUN: 15 mg/dL (ref 7–25)
CO2: 28 mmol/L (ref 20–32)
Calcium: 9.5 mg/dL (ref 8.6–10.4)
Chloride: 104 mmol/L (ref 98–110)
Creat: 0.72 mg/dL (ref 0.50–1.05)
Globulin: 2.4 g/dL (calc) (ref 1.9–3.7)
Glucose, Bld: 168 mg/dL — ABNORMAL HIGH (ref 65–99)
Potassium: 4.4 mmol/L (ref 3.5–5.3)
Sodium: 139 mmol/L (ref 135–146)
Total Bilirubin: 0.6 mg/dL (ref 0.2–1.2)
Total Protein: 6.8 g/dL (ref 6.1–8.1)
eGFR: 96 mL/min/{1.73_m2} (ref >=60)

## 2021-05-03 LAB — CBC WITH DIFFERENTIAL/PLATELET
Absolute Monocytes: 329 cells/uL (ref 200–950)
Basophils Absolute: 28 cells/uL (ref 0–200)
Basophils Relative: 0.4 %
Eosinophils Absolute: 140 cells/uL (ref 15–500)
Eosinophils Relative: 2 %
HCT: 40.6 % (ref 35.0–45.0)
Hemoglobin: 13.8 g/dL (ref 11.7–15.5)
Lymphs Abs: 2737 cells/uL (ref 850–3900)
MCH: 31.2 pg (ref 27.0–33.0)
MCHC: 34 g/dL (ref 32.0–36.0)
MCV: 91.6 fL (ref 80.0–100.0)
MPV: 10.4 fL (ref 7.5–12.5)
Monocytes Relative: 4.7 %
Neutro Abs: 3766 cells/uL (ref 1500–7800)
Neutrophils Relative %: 53.8 %
Platelets: 297 10*3/uL (ref 140–400)
RBC: 4.43 10*6/uL (ref 3.80–5.10)
RDW: 12.3 % (ref 11.0–15.0)
Total Lymphocyte: 39.1 %
WBC: 7 10*3/uL (ref 3.8–10.8)

## 2021-05-03 MED ORDER — LOSARTAN POTASSIUM 50 MG PO TABS
75.0000 mg | ORAL_TABLET | Freq: Every day | ORAL | 0 refills | Status: DC
  Filled 2021-05-03: qty 135, 90d supply, fill #0

## 2021-05-04 ENCOUNTER — Encounter: Payer: Self-pay | Admitting: Rheumatology

## 2021-05-18 ENCOUNTER — Other Ambulatory Visit (HOSPITAL_COMMUNITY): Payer: Self-pay

## 2021-05-20 ENCOUNTER — Other Ambulatory Visit (HOSPITAL_COMMUNITY): Payer: Self-pay

## 2021-05-20 ENCOUNTER — Other Ambulatory Visit: Payer: Self-pay | Admitting: Rheumatology

## 2021-05-20 MED ORDER — GOLIMUMAB 50 MG/0.5ML ~~LOC~~ SOAJ
SUBCUTANEOUS | 2 refills | Status: DC
  Filled 2021-05-20: qty 0.5, fill #0

## 2021-05-20 NOTE — Telephone Encounter (Signed)
Next Visit: due September 2022    Last Visit: 01/27/2021  Last Fill: 04/14/2021 (30 day supply)   [id number] arthritis involving multiple sites with positive rheumatoid factor   Current Dose per office note 01/27/2021: Simponi 50 mg every 28 days.    Labs: 05/03/2021 Glucose is elevated-168. Rest of CMP WNL.  CBC WNL.    TB Gold: 09/01/2020 Neg    Okay to refill Simponi?

## 2021-05-20 NOTE — Telephone Encounter (Signed)
Please schedule patient for a follow up visit. Patient due September 2022. Thanks!

## 2021-05-21 ENCOUNTER — Other Ambulatory Visit (HOSPITAL_COMMUNITY): Payer: Self-pay

## 2021-05-21 ENCOUNTER — Other Ambulatory Visit: Payer: Self-pay | Admitting: Pharmacist

## 2021-05-21 DIAGNOSIS — H5213 Myopia, bilateral: Secondary | ICD-10-CM | POA: Diagnosis not present

## 2021-05-21 DIAGNOSIS — I10 Essential (primary) hypertension: Secondary | ICD-10-CM

## 2021-05-21 DIAGNOSIS — H2513 Age-related nuclear cataract, bilateral: Secondary | ICD-10-CM | POA: Diagnosis not present

## 2021-05-21 DIAGNOSIS — H16223 Keratoconjunctivitis sicca, not specified as Sjogren's, bilateral: Secondary | ICD-10-CM | POA: Diagnosis not present

## 2021-05-21 DIAGNOSIS — D3131 Benign neoplasm of right choroid: Secondary | ICD-10-CM | POA: Diagnosis not present

## 2021-05-21 MED ORDER — EYSUVIS 0.25 % OP SUSP
1.0000 [drp] | OPHTHALMIC | 0 refills | Status: DC
  Filled 2021-05-21: qty 8.3, 30d supply, fill #0

## 2021-05-21 MED ORDER — CEQUA 0.09 % OP SOLN
1.0000 [drp] | Freq: Two times a day (BID) | OPHTHALMIC | 2 refills | Status: DC
  Filled 2021-05-21 – 2021-06-08 (×4): qty 60, 30d supply, fill #0
  Filled 2021-10-13: qty 60, 30d supply, fill #1
  Filled 2022-01-17: qty 60, 30d supply, fill #2

## 2021-05-21 MED ORDER — GOLIMUMAB 50 MG/0.5ML ~~LOC~~ SOAJ
SUBCUTANEOUS | 2 refills | Status: DC
  Filled 2021-05-21: qty 0.5, 30d supply, fill #0
  Filled 2021-06-10: qty 0.5, 30d supply, fill #1
  Filled 2021-07-09: qty 0.5, 30d supply, fill #2

## 2021-05-24 ENCOUNTER — Other Ambulatory Visit (HOSPITAL_COMMUNITY): Payer: Self-pay

## 2021-05-25 ENCOUNTER — Other Ambulatory Visit (HOSPITAL_COMMUNITY): Payer: Self-pay

## 2021-05-26 ENCOUNTER — Other Ambulatory Visit (HOSPITAL_COMMUNITY): Payer: Self-pay

## 2021-05-27 ENCOUNTER — Other Ambulatory Visit (HOSPITAL_COMMUNITY): Payer: Self-pay

## 2021-06-04 ENCOUNTER — Other Ambulatory Visit (HOSPITAL_BASED_OUTPATIENT_CLINIC_OR_DEPARTMENT_OTHER): Payer: Self-pay

## 2021-06-08 ENCOUNTER — Other Ambulatory Visit (HOSPITAL_COMMUNITY): Payer: Self-pay

## 2021-06-10 ENCOUNTER — Other Ambulatory Visit (HOSPITAL_COMMUNITY): Payer: Self-pay

## 2021-06-14 DIAGNOSIS — M9902 Segmental and somatic dysfunction of thoracic region: Secondary | ICD-10-CM | POA: Diagnosis not present

## 2021-06-14 DIAGNOSIS — M62830 Muscle spasm of back: Secondary | ICD-10-CM | POA: Diagnosis not present

## 2021-06-14 DIAGNOSIS — M5386 Other specified dorsopathies, lumbar region: Secondary | ICD-10-CM | POA: Diagnosis not present

## 2021-06-14 DIAGNOSIS — M9905 Segmental and somatic dysfunction of pelvic region: Secondary | ICD-10-CM | POA: Diagnosis not present

## 2021-06-14 DIAGNOSIS — M9903 Segmental and somatic dysfunction of lumbar region: Secondary | ICD-10-CM | POA: Diagnosis not present

## 2021-06-16 ENCOUNTER — Other Ambulatory Visit (HOSPITAL_COMMUNITY): Payer: Self-pay

## 2021-06-30 IMAGING — MG DIGITAL SCREENING BILAT W/ CAD
4 series · 4 of 4 positions shown · non-contrast
Comparison: Previous exam(s).

CLINICAL DATA: Screening.

EXAM:
DIGITAL SCREENING BILATERAL MAMMOGRAM WITH CAD

[L MLO]
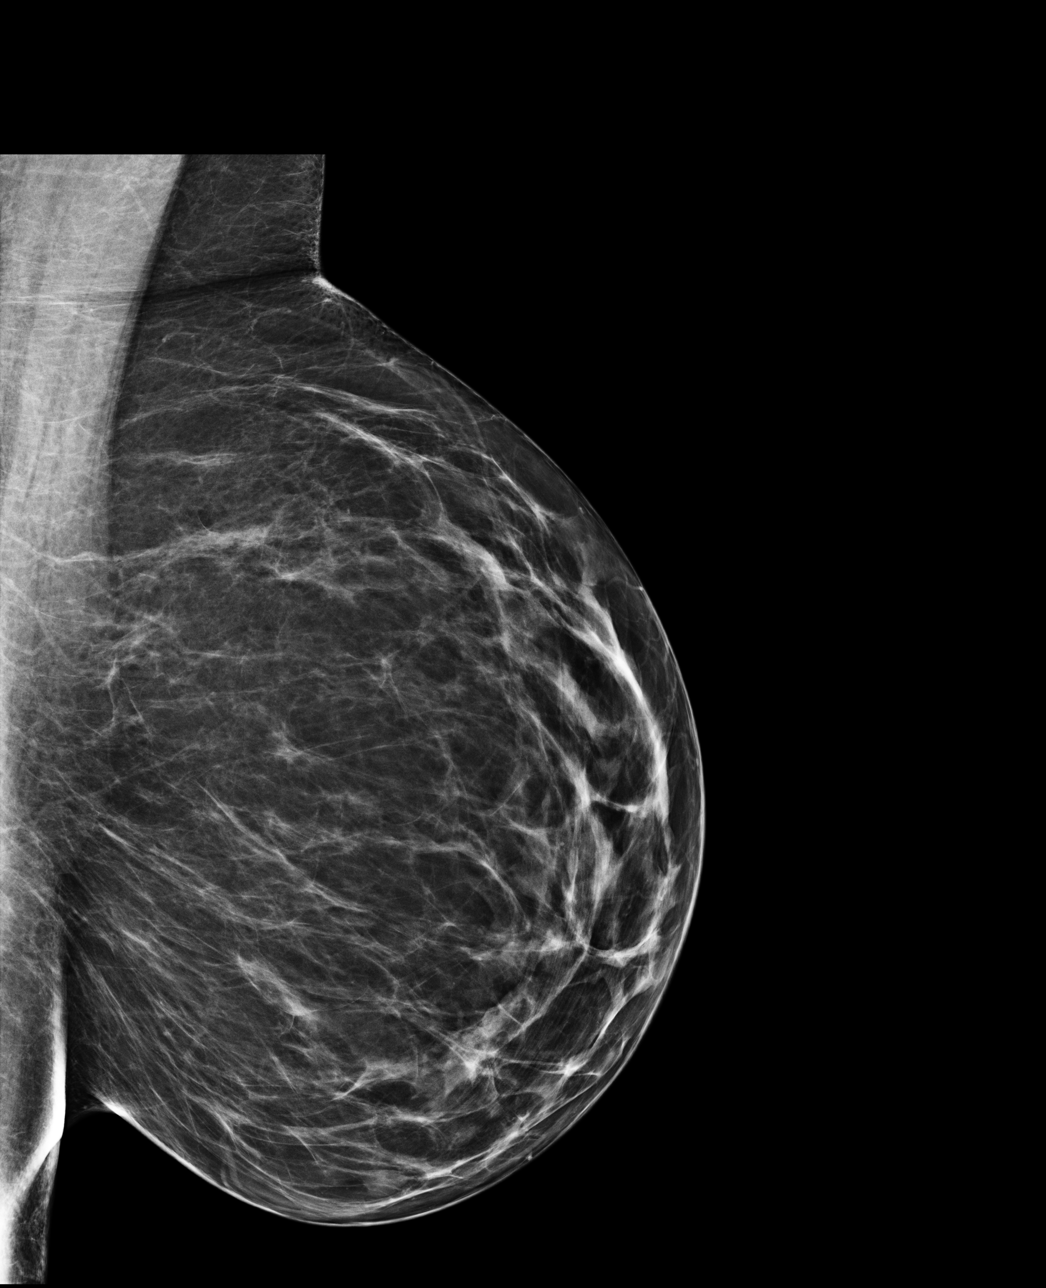

[R MLO]
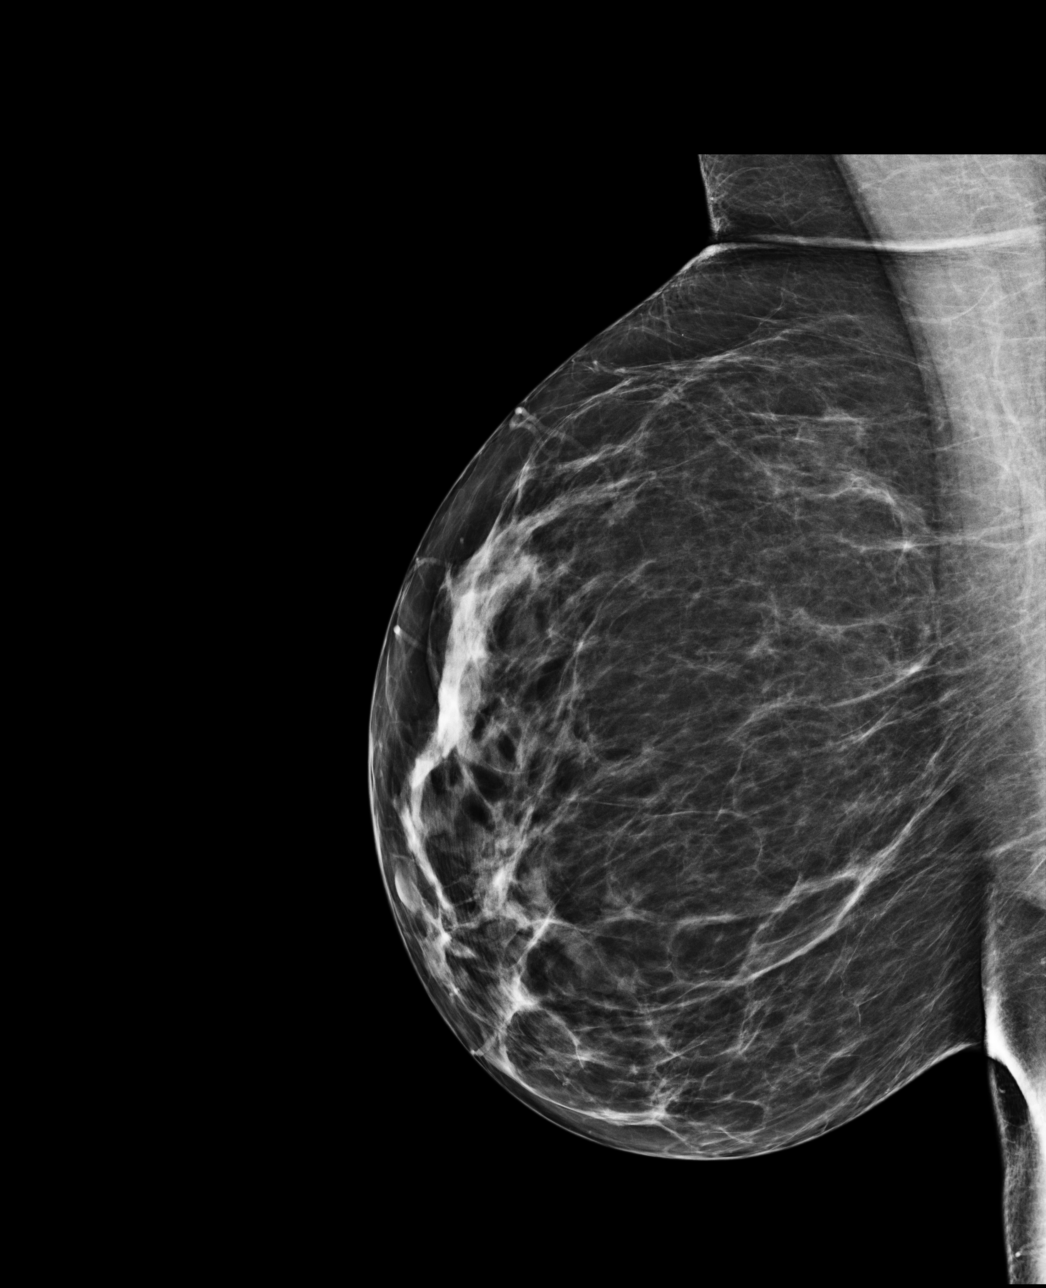

[L CC]
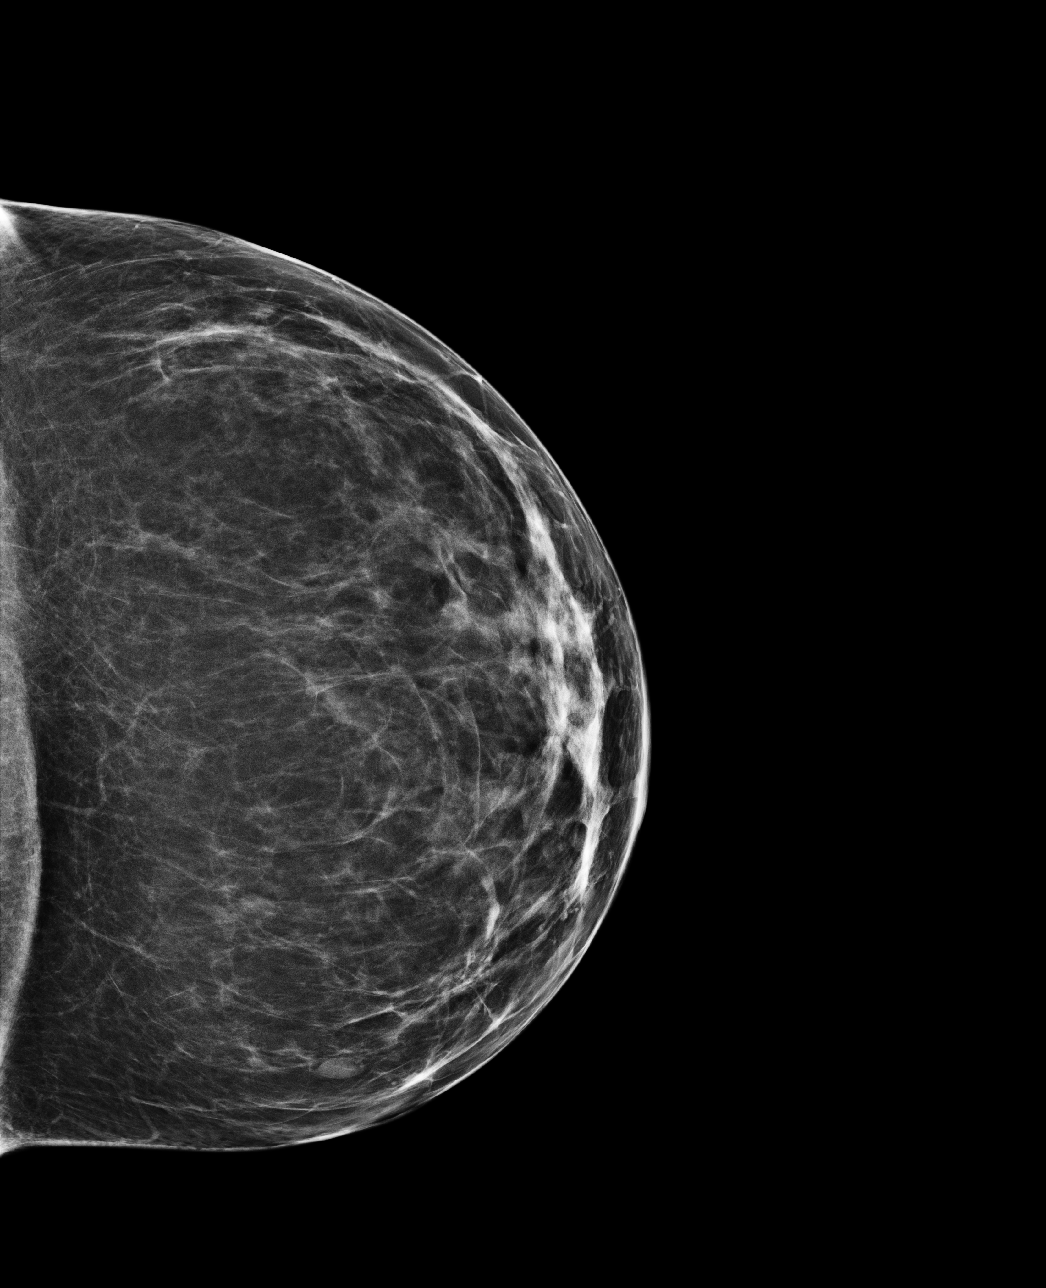

[R CC]
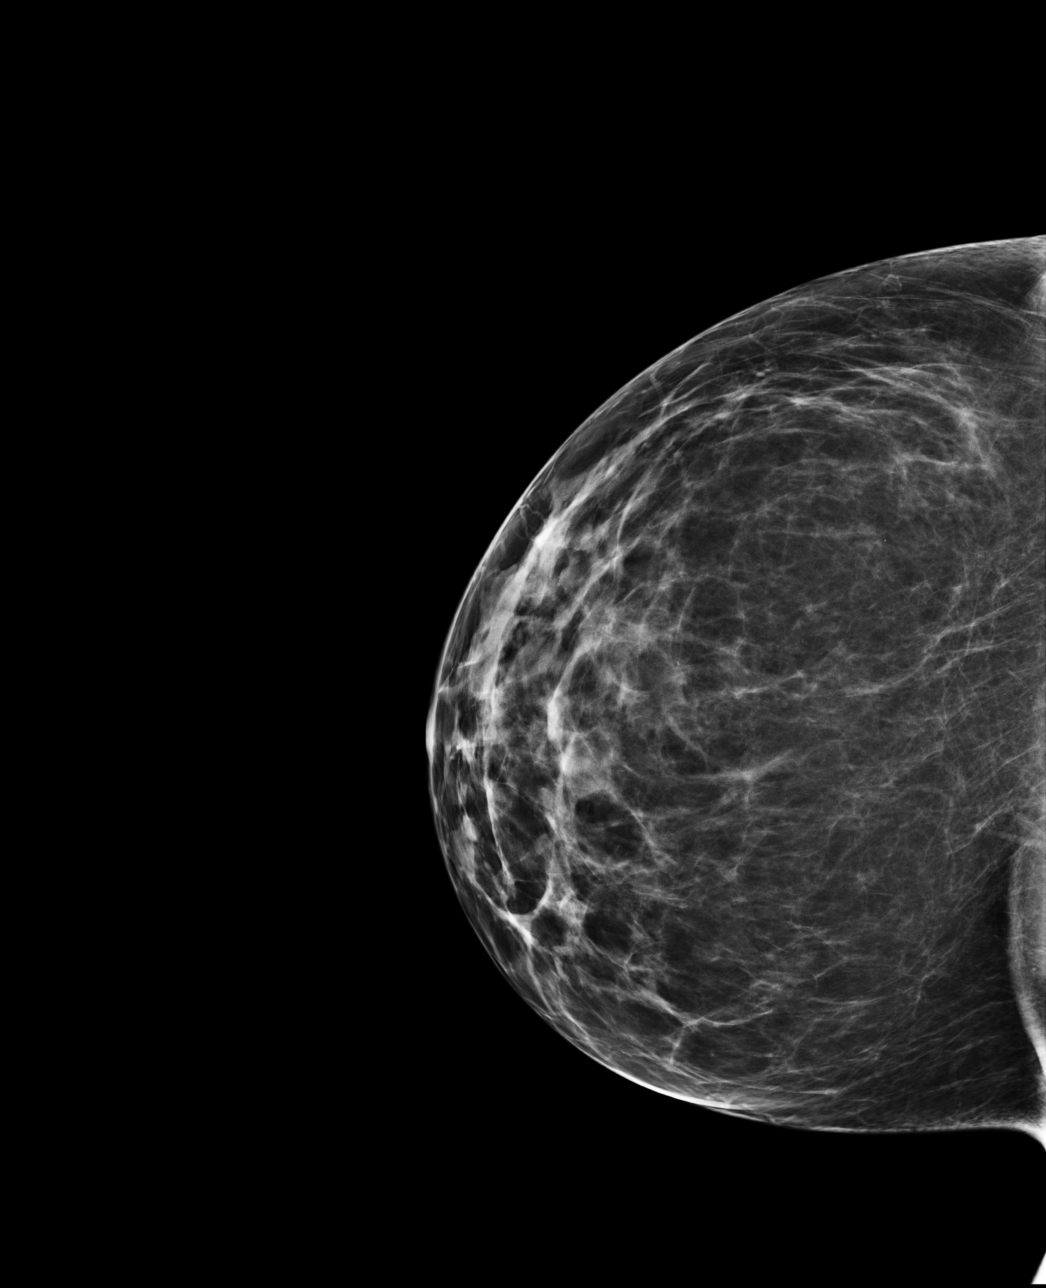

[4 of 4 positions shown; findings below may reference images not displayed]

ACR Breast Density Category b: There are scattered areas of
fibroglandular density.
FINDINGS: There are no findings suspicious for malignancy. Images were
processed with CAD.
IMPRESSION: No mammographic evidence of malignancy. A result letter of this
screening mammogram will be mailed directly to the patient.

RECOMMENDATION:
Screening mammogram in one year. (Code:AS-G-LCT)

BI-RADS CATEGORY  1: Negative.

## 2021-07-09 ENCOUNTER — Other Ambulatory Visit (HOSPITAL_COMMUNITY): Payer: Self-pay

## 2021-07-14 ENCOUNTER — Other Ambulatory Visit (HOSPITAL_COMMUNITY): Payer: Self-pay

## 2021-08-02 ENCOUNTER — Other Ambulatory Visit (HOSPITAL_COMMUNITY): Payer: Self-pay

## 2021-08-02 ENCOUNTER — Other Ambulatory Visit: Payer: Self-pay | Admitting: Family Medicine

## 2021-08-02 DIAGNOSIS — I10 Essential (primary) hypertension: Secondary | ICD-10-CM

## 2021-08-02 MED ORDER — LOSARTAN POTASSIUM 50 MG PO TABS
75.0000 mg | ORAL_TABLET | Freq: Every day | ORAL | 1 refills | Status: DC
  Filled 2021-08-02: qty 135, 90d supply, fill #0

## 2021-08-02 MED FILL — Sertraline HCl Tab 50 MG: ORAL | 90 days supply | Qty: 135 | Fill #2 | Status: CN

## 2021-08-09 ENCOUNTER — Other Ambulatory Visit: Payer: Self-pay | Admitting: Pharmacist

## 2021-08-09 ENCOUNTER — Other Ambulatory Visit (HOSPITAL_COMMUNITY): Payer: Self-pay

## 2021-08-09 ENCOUNTER — Other Ambulatory Visit: Payer: Self-pay | Admitting: Rheumatology

## 2021-08-09 MED ORDER — GOLIMUMAB 50 MG/0.5ML ~~LOC~~ SOAJ
SUBCUTANEOUS | 0 refills | Status: DC
  Filled 2021-08-09: qty 0.5, 30d supply, fill #0

## 2021-08-09 MED ORDER — GOLIMUMAB 50 MG/0.5ML ~~LOC~~ SOAJ
SUBCUTANEOUS | 0 refills | Status: DC
  Filled 2021-08-09: qty 0.5, fill #0

## 2021-08-09 NOTE — Telephone Encounter (Signed)
Next Visit: 08/23/2021    Last Visit: 01/27/2021   Last Fill: 05/21/2021    [id number] arthritis involving multiple sites with positive rheumatoid factor    Current Dose per office note 01/27/2021: Simponi 50 mg every 28 days.     Labs: 05/03/2021 Glucose is elevated-168. Rest of CMP WNL.  CBC WNL.     TB Gold: 09/01/2020 Neg    Patient will update labs next week.    Okay to refill Simponi?

## 2021-08-09 NOTE — Progress Notes (Signed)
Office Visit Note  Patient: Sharon Hunter             Date of Birth: November 28, 1960           MRN: [id number]             PCP: [name], DO Referring: [name], * Visit Date: 08/23/2021 Occupation: @GUAROCC @  Subjective:  Right CMC joint pain   History of Present Illness: Sharon Hunter is a 60 y.o. female with history of seropositive rheumatoid arthritis and osteoarthritis.  She is on Simponi 50 mg subcutaneous injections every 4 to 5 weeks.  She reports that she try spacing the dose of Simponi and every 5 weeks.  She reports that she has ongoing pain in both shoulder joints and has occasional pain at night.  She recently got a new mattress Topper which has alleviated some of her discomfort.  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  She denies any joint swelling at this time.  She has been using a hand massager which alleviates some of her discomfort.  She takes Motrin as needed for pain relief.  She does not want to make any medication changes at this time. She denies any recent infections.  Activities of Daily Living:  Patient reports morning stiffness for 2 hours  Patient Reports nocturnal pain.  Difficulty dressing/grooming: Denies Difficulty climbing stairs: Denies Difficulty getting out of chair: Denies Difficulty using hands for taps, buttons, cutlery, and/or writing: Reports  Review of Systems  Constitutional:  Positive for fatigue.  HENT:  Positive for mouth dryness and nose dryness. Negative for mouth sores.   Eyes:  Positive for dryness. Negative for pain and visual disturbance.  Respiratory:  Negative for cough, hemoptysis, shortness of breath and difficulty breathing.   Cardiovascular:  Negative for chest pain, palpitations, hypertension and swelling in legs/feet.  Gastrointestinal:  Positive for diarrhea. Negative for blood in stool and constipation.   Endocrine: Negative for increased urination.  Genitourinary:  Negative for painful urination.  Musculoskeletal:  Positive for joint pain, joint pain, joint swelling and morning stiffness. Negative for myalgias, muscle weakness, muscle tenderness and myalgias.  Skin:  Positive for color change. Negative for pallor, rash, hair loss, nodules/bumps, skin tightness, ulcers and sensitivity to sunlight.  Allergic/Immunologic: Negative for susceptible to infections.  Neurological:  Negative for dizziness, numbness, headaches and weakness.  Hematological:  Negative for swollen glands.  Psychiatric/Behavioral:  Negative for depressed mood and sleep disturbance. The patient is nervous/anxious.    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   Rate-related bundle branch block 01/30/2015   Palpitations 01/30/2015   Pulmonary nodule 07/04/2011   Murmur 02/03/2011   Rheumatoid arthritis ([place]) 02/03/2011   Hair loss 02/03/2011   Fatigue 02/03/2011    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  Hypertension    IBS (irritable bowel syndrome) 08/01/2016   Migraine    Murmur 02/03/2011   Ovarian failure    Pulmonary nodule, left 2009   last CT Nov 2011-- due 08/2011   Rheumatoid arthritis(714.0)    in remission , off meds as of Summer 2012    Family History  Problem Relation Age of Onset   Alcohol abuse Father    Hyperlipidemia Father    Heart disease Father    Alcohol  abuse Brother    Diabetes Brother    Hypertension Brother    Arthritis Mother    Uterine cancer Mother    Hypertension Mother    Heart disease Sister        cardiac stent   Diabetes Sister    Hyperlipidemia Sister    Hypertension Sister    Cancer Brother        lung   Cancer Maternal Aunt        lung----non smoker   Past Surgical History:  Procedure Laterality Date   CHOLECYSTECTOMY  1999   COLONOSCOPY  10/31/11   EXPLORATORY LAPAROTOMY     TONSILLECTOMY  1969   WISDOM TOOTH EXTRACTION     Social History   Social History Narrative   Exercise-- jogs   Immunization History  Administered Date(s) Administered   Influenza Split 07/03/2013   Influenza,inj,Quad PF,6-35 Mos 07/17/2020   Influenza-Unspecified 07/03/2014, 07/04/2015   PFIZER(Purple Top)SARS-COV-2 Vaccination 01/13/2020, 05/15/2020   Tdap 12/01/2009     Objective: Vital Signs: BP (!) 150/81 (BP Location: Right Arm, Patient Position: Sitting, Cuff Size: Small)   Pulse 61   Resp 12   Ht 5\' 7"  (1.702 m)   Wt 213 lb 3.2 oz (96.7 kg)   LMP 10/03/2005   BMI 33.39 kg/m    Physical Exam Vitals and nursing note reviewed.  Constitutional:      Appearance: She is well-developed.  HENT:     Head: Normocephalic and atraumatic.  Eyes:     Conjunctiva/sclera: Conjunctivae normal.  Pulmonary:     Effort: Pulmonary effort is normal.  Abdominal:     Palpations: Abdomen is soft.  Musculoskeletal:     Cervical back: Normal range of motion.  Skin:    General: Skin is warm and dry.     Capillary Refill: Capillary refill takes less than 2 seconds.  Neurological:     Mental Status: She is alert and oriented to person, place, and time.  Psychiatric:        Behavior: Behavior normal.     Musculoskeletal Exam: C-spine has slightly limited ROM with lateral rotation.  Shoulder joints have painful and limited internal rotation of both shoulders.  Elbow joints, wrist joints, MCPs, PIPs, and DIPs good ROM with no  synovitis.  Complete fist formation bilaterally.  Right CMC joint tenderness and inflammation noted.  PIP and DIP thickening consistent with osteoarthritis of both hands noted.  Hip joints, knee joints, and ankle joints have good ROM with no discomfort.  No warmth or effusion of knee joints.  Some tenderness and fullness of the left ankle noted.  CDAI Exam: CDAI Score: 2.4  Patient Global: 2 mm; Provider Global: 2 mm Swollen: 0 ; Tender: 3  Joint Exam 08/23/2021      Right  Left  Glenohumeral   Tender   Tender  CMC   Tender        Investigation: No additional findings.  Imaging: No results found.  Recent Labs: Lab Results  Component Value  Date   WBC 8.5 08/19/2021   HGB 13.7 08/19/2021   PLT 315 08/19/2021   NA 137 08/19/2021   K 4.6 08/19/2021   CL 102 08/19/2021   CO2 26 08/19/2021   GLUCOSE 88 08/19/2021   BUN 18 08/19/2021   CREATININE 0.76 08/19/2021   BILITOT 0.6 08/19/2021   ALKPHOS 58 03/27/2017   AST 12 08/19/2021   ALT 19 08/19/2021   PROT 7.1 08/19/2021   ALBUMIN 4.3 03/27/2017   CALCIUM 9.7 08/19/2021   GFRAA 86 01/07/2021   QFTBGOLDPLUS NEGATIVE 09/12/2018    Speciality Comments: Prior therapy: methotrexate, remicade, orencia, and enbrel (inadequate response)  Procedures:  No procedures performed Allergies: Other and Contrast media [iodinated diagnostic agents]    Assessment / Plan:     Visit Diagnoses: Rheumatoid arthritis involving multiple sites with positive rheumatoid factor ([place]): She continues to experience intermittent pain and stiffness in both shoulders, both hands, both feet.  She presents today with tenderness and inflammation of the right CMC joint.  She has painful limited range of motion with internal rotation of both shoulders on examination.  She is currently on Simponi 50 mg subcutaneous injections every 28 days.  She has been spacing the dose of something every 5 weeks as tolerated.  She has been taking Motrin as needed for pain  relief.  She continues to find Simponi to be effective at managing her rheumatoid arthritis and does not feel that she has breakthrough symptoms leading up to the due date for her next injection.  She does not want to make any medication changes at this time.  Discussed that she should inject Simponi every 4 weeks as prescribed to see if this will increase the efficacy.  She also plans on scheduling an ultrasound-guided right [facility] joint injection in the future.  She was advised to notify us if she develops increased joint pain or joint swelling.  She will follow-up in the office in 5 months.  High risk medication use - Simponi 50 mg sq injections every 28 days. CBC and CMP within normal limits on 08/19/2021.  She will be due to update lab work in February and every 3 months to monitor for drug toxicity.  Standing orders for CBC and CMP remain in place.  TB Gold negative on 09/01/2020 and was recently updated through health at work.  She will have the results forwarded to our office. She has not had any recent infections.  Discussed the importance of holding Simponi if she develops signs or symptoms of an infection and to resume once the infection has completely cleared.  Chronic pain of both shoulders: She continues to have chronic pain and stiffness in both shoulder joints.  Her nocturnal pain has improved since getting a new mattress Topper.  On examination she has painful limited internal rotation bilaterally.  She takes Motrin as needed for pain relief.  She does not want to make any medication changes at this time.  She was advised to notify us if her discomfort persists or worsens.  Primary osteoarthritis of both hands: She has PIP and DIP thickening consistent with osteoarthritis of both hands.  Tenderness and inflammation over the right [facility] joint was noted on examination today.  She is able to make a complete fist bilaterally.  Discussed the importance of joint protection and muscle strengthening.  She  was given a handout of hand exercises to perform.  Discussed the use of Voltaren gel which she can apply topically as needed for pain relief.  Also discussed the use of a right CMC joint brace which she plans on purchasing on [employer].  She was advised to notify us if she would like to return for a right [facility] joint ultrasound-guided cortisone injection in the future.  Other fatigue: Chronic and secondary to insomnia.  Discussed the importance of regular exercise and good sleep hygiene.  Sicca syndrome [facility]): She continues to have chronic sicca symptoms.  She continues to follow-up with her ophthalmologist closely as well as her dentist.  She is using cyclosporine eyedrops twice daily.  Discussed the use of over-the-counter products for breakthrough symptoms as well.  History of vitamin D deficiency  Other medical conditions are listed as follows:  History of anxiety  History of hypertension  Pulmonary nodule  History of bundle branch block  History of shingles  Orders: No orders of the defined types were placed in this encounter.  Meds ordered this encounter  Medications   Golimumab 50 MG/0.5ML SOAJ    Sig: INJECT 50 MG INTO THE SKIN EVERY 30 DAYS    Dispense:  1.5 mL    Refill:  0    Follow-Up Instructions: Return in about 5 months (around 01/21/2022) for Rheumatoid arthritis, Osteoarthritis.   Ofilia Neas, PA-C  Note - This record has been created using Dragon software.  Chart creation errors have been sought, but may not always  have been located. Such creation errors do not reflect on  the standard of medical care.

## 2021-08-10 ENCOUNTER — Other Ambulatory Visit (HOSPITAL_COMMUNITY): Payer: Self-pay

## 2021-08-13 ENCOUNTER — Ambulatory Visit: Payer: 59 | Admitting: Family

## 2021-08-13 ENCOUNTER — Other Ambulatory Visit (HOSPITAL_BASED_OUTPATIENT_CLINIC_OR_DEPARTMENT_OTHER): Payer: Self-pay

## 2021-08-13 ENCOUNTER — Telehealth: Payer: Self-pay

## 2021-08-13 ENCOUNTER — Other Ambulatory Visit: Payer: Self-pay

## 2021-08-13 ENCOUNTER — Other Ambulatory Visit (HOSPITAL_COMMUNITY): Payer: Self-pay

## 2021-08-13 ENCOUNTER — Other Ambulatory Visit: Payer: Self-pay | Admitting: Family Medicine

## 2021-08-13 VITALS — BP 138/89 | HR 55 | Temp 98.0°F | Ht 67.0 in | Wt 215.6 lb

## 2021-08-13 DIAGNOSIS — H9202 Otalgia, left ear: Secondary | ICD-10-CM | POA: Diagnosis not present

## 2021-08-13 DIAGNOSIS — I10 Essential (primary) hypertension: Secondary | ICD-10-CM | POA: Diagnosis not present

## 2021-08-13 MED ORDER — AMOXICILLIN-POT CLAVULANATE 250-62.5 MG/5ML PO SUSR
875.0000 mg | Freq: Two times a day (BID) | ORAL | 0 refills | Status: AC
  Filled 2021-08-13: qty 300, 9d supply, fill #0
  Filled 2021-08-13: qty 350, 10d supply, fill #0

## 2021-08-13 MED ORDER — LOSARTAN POTASSIUM 100 MG PO TABS
100.0000 mg | ORAL_TABLET | Freq: Every day | ORAL | 0 refills | Status: DC
  Filled 2021-08-13 – 2021-08-16 (×2): qty 30, 30d supply, fill #0

## 2021-08-13 MED FILL — Sertraline HCl Tab 50 MG: ORAL | 90 days supply | Qty: 135 | Fill #2 | Status: AC

## 2021-08-13 NOTE — Patient Instructions (Signed)
Increase losartan 75 mg to 100 mg once daily. Monitor blood pressure daily for goal of <130/90. Low salt intake. If any chest pain, palpitations or shortness of breath go to ER.   For ear/jaw pain apply warm compresses, take ibuprofen 400 mg po every 8 hours as needed for pain. Begin Augmentin as prescribed. Follow up if symptoms do not improve.

## 2021-08-13 NOTE — Progress Notes (Signed)
Subjective:     Patient ID: Sharon Hunter, female    DOB: 03/03/1961, 60 y.o.   MRN: [id number]  Chief Complaint  Patient presents with   left ear pain    Started yesterday. Feels lighten bolts in the ear. Off and on. Possible TMJ     HPI  Patient is in today for left sided ear that started three days ago that radiates directly below her ear. Pain at current is 0/10 but thirty minutes ago was at it's maximum and was throbbing/ stabbing pain at 10/10. She did take ibuprofen 600 mg which has started to help. Dry nonproductive cough. Denies sore throat. Denies fever denies blurry vision or temple pain/headache.denies drainage from ear. Denies congestion and or fever.   She does note that her blood pressure seems to be on average 175/88 when she does check her blood pressure. No cp palp or sob. She is taking 1.5 tablets of 50 mg losartan. She denies cough.   Health Maintenance Due  Topic Date Due   Pneumococcal Vaccine 51-1 Years old (1 - PCV) Never done   HIV Screening  Never done   Hepatitis C Screening  Never done   Zoster Vaccines- Shingrix (1 of 2) Never done   TETANUS/TDAP  12/02/2019   COVID-19 Vaccine (3 - Pfizer risk series) 06/12/2020   INFLUENZA VACCINE  05/03/2021    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   Hypertension    IBS (irritable bowel syndrome) 08/01/2016   Migraine    Murmur 02/03/2011   Ovarian failure    Pulmonary nodule, left 2009   last CT Nov 2011-- due 08/2011   Rheumatoid arthritis(714.0)    in remission , off meds as of Summer 2012    Past Surgical History:  Procedure Laterality Date   CHOLECYSTECTOMY  1999   COLONOSCOPY  10/31/11   EXPLORATORY LAPAROTOMY     TONSILLECTOMY  1969   WISDOM TOOTH EXTRACTION      Family History  Problem Relation Age of Onset   Alcohol abuse Father    Hyperlipidemia Father    Heart disease Father     Alcohol abuse Brother    Diabetes Brother    Hypertension Brother    Arthritis Mother    Uterine cancer Mother    Hypertension Mother    Heart disease Sister        cardiac stent   Diabetes Sister    Hyperlipidemia Sister    Hypertension Sister    Cancer Brother        lung   Cancer Maternal Aunt        lung----non smoker    Social History   Socioeconomic History   Marital status: Single    Spouse name: Not on file   Number of children: 1   Years of education: 50   Highest education level: Not on file  Occupational History   Not on file  Tobacco Use   Smoking status: Former    Types: Cigarettes    Quit date: 10/03/1990    Years since quitting: 30.8   Smokeless tobacco: Never  Vaping Use   Vaping Use: Never used  Substance and Sexual Activity   Alcohol use: Yes    Alcohol/week: 3.0 standard drinks    Types: 3 Standard drinks or equivalent per week    Comment: Red wine 4  oz 4-5 times a week   Drug use: No   Sexual activity: Not Currently    Partners: Male  Other Topics Concern   Not on file  Social History Narrative   Exercise-- jogs   Social Determinants of Health   Financial Resource Strain: Not on file  Food Insecurity: Not on file  Transportation Needs: Not on file  Physical Activity: Not on file  Stress: Not on file  Social Connections: Not on file  Intimate Partner Violence: Not on file    Outpatient Medications Prior to Visit  Medication Sig Dispense Refill   cycloSPORINE, PF, (CEQUA) 0.09 % SOLN Place 1 drop into both eyes 2 (two) times daily. 60 each 2   diclofenac sodium (VOLTAREN) 1 % GEL APPLY 3 GRAMS TO LARGE JOINTS THREE TIMES DAILY AS NEEDED FOR PAIN 300 g 3   Doxylamine Succinate, Sleep, (UNISOM PO) Take 0.5 tablets by mouth at bedtime.     EYSUVIS 0.25 % SUSP Instill 1 drop in both eyes 4 times a day for 7 days, then 2 times a day for 7 days, then stop 8.3 mL 0   Golimumab 50 MG/0.5ML SOAJ INJECT 50 MG INTO THE SKIN EVERY 30 DAYS 0.5 mL 0    Ibuprofen (MOTRIN PO) Take by mouth as needed.     MELATONIN PO Take 5-10 mg by mouth at bedtime.     mometasone (NASONEX) 50 MCG/ACT nasal spray INSTILL 2 SPRAYS INTO THE NOSE DAILY 17 g 12   sertraline (ZOLOFT) 50 MG tablet TAKE 1 & 1/2 TABLETS BY MOUTH EVERY DAY 135 tablet 3   losartan (COZAAR) 50 MG tablet Take 1.5 tablets (75 mg total) by mouth daily. 135 tablet 1   CALCIUM-MAGNESIUM-VITAMIN D PO Take by mouth daily. (Patient not taking: Reported on 08/13/2021)     Lifitegrast (XIIDRA) 5 % SOLN Apply to eye.     No facility-administered medications prior to visit.    Allergies  Allergen Reactions   Other Swelling    Contrast dye- head swelling per pt.   Contrast Media [Iodinated Diagnostic Agents]     Review of Systems  Constitutional:  Negative for chills and fever.  HENT:  Positive for ear pain (left ear). Negative for congestion, ear discharge, hearing loss, sinus pain, sore throat and tinnitus.        Left sided jaw pain, worse with eating.   Eyes:  Negative for double vision and pain.  Respiratory:  Positive for cough.   Cardiovascular:  Negative for chest pain and leg swelling.  Skin:  Negative for rash.  Neurological:  Negative for sensory change.      Objective:    Physical Exam Constitutional:      General: She is not in acute distress.    Appearance: Normal appearance. She is obese. She is not ill-appearing.  HENT:     Head: Normocephalic. No contusion.     Jaw: Pain on movement (left side of jaw with associated clicking) present. No trismus, tenderness or swelling.     Right Ear: Tympanic membrane, ear canal and external ear normal. No swelling (preauricular, mild tenderness on palpation).     Left Ear: Tympanic membrane, ear canal and external ear normal.     Nose: No congestion.     Mouth/Throat:     Pharynx: No oropharyngeal exudate or posterior oropharyngeal erythema.  Eyes:     Pupils: Pupils are equal, round, and reactive to light.   Cardiovascular:     Rate and  Rhythm: Normal rate and regular rhythm.  Pulmonary:     Effort: Pulmonary effort is normal.     Breath sounds: Normal breath sounds. No wheezing.  Lymphadenopathy:     Head:     Left side of head: Preauricular (mild tenderness on palpation) adenopathy present.  Neurological:     General: No focal deficit present.     Mental Status: She is alert.    BP 138/89   Pulse (!) 55   Temp 98 F (36.7 C) (Oral)   Ht 5\' 7"  (1.702 m)   Wt 215 lb 9.6 oz (97.8 kg)   LMP 10/03/2005   SpO2 99%   BMI 33.77 kg/m  Wt Readings from Last 3 Encounters:  08/13/21 215 lb 9.6 oz (97.8 kg)  01/27/21 207 lb (93.9 kg)  11/11/20 201 lb 4 oz (91.3 kg)       Assessment & Plan:   Problem List Items Addressed This Visit       Cardiovascular and Mediastinum   Hypertension    Blood pressure still elevated at visit, and per patient record. Currently taking 75 mg losartan daily, will increase to 100 mg po qd and have pt f/u with pcp within one week along with blood pressure log. If cp palp sob go to er and or call 911.       Relevant Medications   losartan (COZAAR) 100 MG tablet     Other   Ear pain, left - Primary    Acute left ear pain with preauricular swelling, will choose to treat as ddx parotitis not to be ruled out.suspension of augmentin preferred per pt. Pt to f/u within 1 week with pcp, however if sx continue to worsen and no improvement within 48 hours treatment please f/u sooner. Warm compresses to site. Analgesics prn. Pt to notify her rheumatologist of antibiotics regimen due to mediation she is currently taking.       Relevant Medications   amoxicillin-clavulanate (AUGMENTIN) 250-62.5 MG/5ML suspension    I have discontinued Sharon Hunter "Jodette"'s Xiidra, CALCIUM-MAGNESIUM-VITAMIN D PO, and losartan. I am also having her start on losartan and amoxicillin-clavulanate. Additionally, I am having her maintain her diclofenac sodium, MELATONIN PO,  (Doxylamine Succinate, Sleep, (UNISOM PO)), sertraline, Ibuprofen (MOTRIN PO), mometasone, Cequa, Eysuvis, and Golimumab.  Meds ordered this encounter  Medications   losartan (COZAAR) 100 MG tablet    Sig: Take 1 tablet (100 mg total) by mouth daily.    Dispense:  30 tablet    Refill:  0   amoxicillin-clavulanate (AUGMENTIN) 250-62.5 MG/5ML suspension    Sig: Take 17.5 mLs (875 mg total) by mouth 2 (two) times daily for 10 days.    Dispense:  350 mL    Refill:  0

## 2021-08-13 NOTE — Assessment & Plan Note (Addendum)
Acute left ear pain with preauricular swelling, will choose to treat as ddx parotitis not to be ruled out.suspension of augmentin preferred per pt. Pt to f/u within 1 week with pcp, however if sx continue to worsen and no improvement within 48 hours treatment please f/u sooner. Warm compresses to site. Analgesics prn. Pt to notify her rheumatologist of antibiotics regimen due to mediation she is currently taking.

## 2021-08-13 NOTE — Telephone Encounter (Signed)
Appt has already been scheduled.   [facility] - Client Client Site [facility] - Night Physician Roma Schanz- MD Contact Type Call Who Is Calling Patient / Member / Family / Caregiver Caller Name Juanita Streight Caller Phone Number [phone] Patient Name Kalise Fickett Patient DOB 15-Apr-1961 Call Type Message Only Information Provided Reason for Call Request to Schedule Office Appointment Initial Comment Caller states needs an appointment for an earache. Patient request to speak to RN No Additional Comment Provided office hours. Disp. Time Disposition Final User 08/13/2021 7:37:40 AM General Information Provided Yes Atha Starks Michelene Gardener Call Closed By: Baker Janus Transaction Date/Time: 08/13/2021 7:35:12 AM (ET)

## 2021-08-13 NOTE — Assessment & Plan Note (Signed)
Blood pressure still elevated at visit, and per patient record. Currently taking 75 mg losartan daily, will increase to 100 mg po qd and have pt f/u with pcp within one week along with blood pressure log. If cp palp sob go to er and or call 911.

## 2021-08-16 ENCOUNTER — Other Ambulatory Visit (HOSPITAL_COMMUNITY): Payer: Self-pay

## 2021-08-17 DIAGNOSIS — H16223 Keratoconjunctivitis sicca, not specified as Sjogren's, bilateral: Secondary | ICD-10-CM | POA: Diagnosis not present

## 2021-08-19 ENCOUNTER — Other Ambulatory Visit: Payer: Self-pay | Admitting: *Deleted

## 2021-08-19 DIAGNOSIS — M0579 Rheumatoid arthritis with rheumatoid factor of multiple sites without organ or systems involvement: Secondary | ICD-10-CM

## 2021-08-19 DIAGNOSIS — Z79899 Other long term (current) drug therapy: Secondary | ICD-10-CM | POA: Diagnosis not present

## 2021-08-20 LAB — COMPLETE METABOLIC PANEL WITH GFR
AG Ratio: 1.7 (calc) (ref 1.0–2.5)
ALT: 19 U/L (ref 6–29)
AST: 12 U/L (ref 10–35)
Albumin: 4.5 g/dL (ref 3.6–5.1)
Alkaline phosphatase (APISO): 65 U/L (ref 37–153)
BUN: 18 mg/dL (ref 7–25)
CO2: 26 mmol/L (ref 20–32)
Calcium: 9.7 mg/dL (ref 8.6–10.4)
Chloride: 102 mmol/L (ref 98–110)
Creat: 0.76 mg/dL (ref 0.50–1.05)
Globulin: 2.6 g/dL (calc) (ref 1.9–3.7)
Glucose, Bld: 88 mg/dL (ref 65–99)
Potassium: 4.6 mmol/L (ref 3.5–5.3)
Sodium: 137 mmol/L (ref 135–146)
Total Bilirubin: 0.6 mg/dL (ref 0.2–1.2)
Total Protein: 7.1 g/dL (ref 6.1–8.1)
eGFR: 90 mL/min/{1.73_m2} (ref >=60)

## 2021-08-20 LAB — CBC WITH DIFFERENTIAL/PLATELET
Absolute Monocytes: 740 cells/uL (ref 200–950)
Basophils Absolute: 51 cells/uL (ref 0–200)
Basophils Relative: 0.6 %
Eosinophils Absolute: 170 cells/uL (ref 15–500)
Eosinophils Relative: 2 %
HCT: 40.1 % (ref 35.0–45.0)
Hemoglobin: 13.7 g/dL (ref 11.7–15.5)
Lymphs Abs: 2873 cells/uL (ref 850–3900)
MCH: 31.6 pg (ref 27.0–33.0)
MCHC: 34.2 g/dL (ref 32.0–36.0)
MCV: 92.6 fL (ref 80.0–100.0)
MPV: 10.7 fL (ref 7.5–12.5)
Monocytes Relative: 8.7 %
Neutro Abs: 4667 cells/uL (ref 1500–7800)
Neutrophils Relative %: 54.9 %
Platelets: 315 10*3/uL (ref 140–400)
RBC: 4.33 10*6/uL (ref 3.80–5.10)
RDW: 12.3 % (ref 11.0–15.0)
Total Lymphocyte: 33.8 %
WBC: 8.5 10*3/uL (ref 3.8–10.8)

## 2021-08-20 NOTE — Progress Notes (Signed)
CBC and CMP normal

## 2021-08-23 ENCOUNTER — Ambulatory Visit: Payer: 59 | Admitting: Physician Assistant

## 2021-08-23 ENCOUNTER — Other Ambulatory Visit: Payer: Self-pay

## 2021-08-23 ENCOUNTER — Encounter: Payer: Self-pay | Admitting: Physician Assistant

## 2021-08-23 ENCOUNTER — Other Ambulatory Visit (HOSPITAL_COMMUNITY): Payer: Self-pay

## 2021-08-23 VITALS — BP 150/81 | HR 61 | Resp 12 | Ht 67.0 in | Wt 213.2 lb

## 2021-08-23 DIAGNOSIS — M19041 Primary osteoarthritis, right hand: Secondary | ICD-10-CM

## 2021-08-23 DIAGNOSIS — G8929 Other chronic pain: Secondary | ICD-10-CM

## 2021-08-23 DIAGNOSIS — M25511 Pain in right shoulder: Secondary | ICD-10-CM

## 2021-08-23 DIAGNOSIS — M0579 Rheumatoid arthritis with rheumatoid factor of multiple sites without organ or systems involvement: Secondary | ICD-10-CM | POA: Diagnosis not present

## 2021-08-23 DIAGNOSIS — Z8619 Personal history of other infectious and parasitic diseases: Secondary | ICD-10-CM

## 2021-08-23 DIAGNOSIS — Z79899 Other long term (current) drug therapy: Secondary | ICD-10-CM | POA: Diagnosis not present

## 2021-08-23 DIAGNOSIS — R5383 Other fatigue: Secondary | ICD-10-CM | POA: Diagnosis not present

## 2021-08-23 DIAGNOSIS — M25512 Pain in left shoulder: Secondary | ICD-10-CM

## 2021-08-23 DIAGNOSIS — Z8659 Personal history of other mental and behavioral disorders: Secondary | ICD-10-CM | POA: Diagnosis not present

## 2021-08-23 DIAGNOSIS — Z8639 Personal history of other endocrine, nutritional and metabolic disease: Secondary | ICD-10-CM

## 2021-08-23 DIAGNOSIS — M3500 Sicca syndrome, unspecified: Secondary | ICD-10-CM | POA: Diagnosis not present

## 2021-08-23 DIAGNOSIS — Z8679 Personal history of other diseases of the circulatory system: Secondary | ICD-10-CM | POA: Diagnosis not present

## 2021-08-23 DIAGNOSIS — M19042 Primary osteoarthritis, left hand: Secondary | ICD-10-CM

## 2021-08-23 DIAGNOSIS — R911 Solitary pulmonary nodule: Secondary | ICD-10-CM

## 2021-08-23 MED ORDER — GOLIMUMAB 50 MG/0.5ML ~~LOC~~ SOAJ
SUBCUTANEOUS | 0 refills | Status: DC
Start: 2021-08-23 — End: 2021-09-10
  Filled 2021-08-23: qty 1.5, fill #0

## 2021-08-23 NOTE — Patient Instructions (Addendum)
Standing Labs We placed an order today for your standing lab work.   Please have your standing labs drawn in February and every 3 months   If possible, please have your labs drawn 2 weeks prior to your appointment so that the provider can discuss your results at your appointment.  Please note that you may see your imaging and lab results in [facility] before we have reviewed them. We may be awaiting multiple results to interpret others before contacting you. Please allow our office up to 72 hours to thoroughly review all of the results before contacting the office for clarification of your results.  We have open lab daily: Monday through Thursday from 1:30-4:30 PM and Friday from 1:30-4:00 PM at the office of Dr. Bo Merino, [facility].   Please be advised, all patients with office appointments requiring lab work will take precedent over walk-in lab work.  If possible, please come for your lab work on Monday and Friday afternoons, as you may experience shorter wait times. The office is located at [address] [place], [place], [place] [address] No appointment is necessary.   Labs are drawn by [facility]. Please bring your co-pay at the time of your lab draw.  You may receive a bill from [facility] for your lab work.  If you wish to have your labs drawn at another location, please call the office 24 hours in advance to send orders.  If you have any questions regarding directions or hours of operation,  please call [phone].   As a reminder, please drink plenty of water prior to coming for your lab work. Thanks!  Hand Exercises Hand exercises can be helpful for almost anyone. These exercises can strengthen the hands, improve flexibility and movement, and increase blood flow to the hands. These results can make work and daily tasks easier. Hand exercises can be especially helpful for people who have joint pain from arthritis or have nerve damage from overuse (carpal tunnel  syndrome). These exercises can also help people who have injured a hand. Exercises Most of these hand exercises are gentle stretching and motion exercises. It is usually safe to do them often throughout the day. Warming up your hands before exercise may help to reduce stiffness. You can do this with gentle massage or by placing your hands in warm water for 10-15 minutes. It is normal to feel some stretching, pulling, tightness, or mild discomfort as you begin new exercises. This will gradually improve. Stop an exercise right away if you feel sudden, severe pain or your pain gets worse. Ask your health care provider which exercises are best for you. Knuckle bend or "claw" fist  Stand or sit with your arm, hand, and all five fingers pointed straight up. Make sure to keep your wrist straight during the exercise. Gently bend your fingers down toward your palm until the tips of your fingers are touching the top of your palm. Keep your big knuckle straight and just bend the small knuckles in your fingers. Hold this position for __________ seconds. Straighten (extend) your fingers back to the starting position. Repeat this exercise 5-10 times with each hand. Full finger fist  Stand or sit with your arm, hand, and all five fingers pointed straight up. Make sure to keep your wrist straight during the exercise. Gently bend your fingers into your palm until the tips of your fingers are touching the middle of your palm. Hold this position for __________ seconds. Extend your fingers back to the starting  position, stretching every joint fully. Repeat this exercise 5-10 times with each hand. Straight fist Stand or sit with your arm, hand, and all five fingers pointed straight up. Make sure to keep your wrist straight during the exercise. Gently bend your fingers at the big knuckle, where your fingers meet your hand, and the middle knuckle. Keep the knuckle at the tips of your fingers straight and try to touch  the bottom of your palm. Hold this position for __________ seconds. Extend your fingers back to the starting position, stretching every joint fully. Repeat this exercise 5-10 times with each hand. Tabletop  Stand or sit with your arm, hand, and all five fingers pointed straight up. Make sure to keep your wrist straight during the exercise. Gently bend your fingers at the big knuckle, where your fingers meet your hand, as far down as you can while keeping the small knuckles in your fingers straight. Think of forming a tabletop with your fingers. Hold this position for __________ seconds. Extend your fingers back to the starting position, stretching every joint fully. Repeat this exercise 5-10 times with each hand. Finger spread  Place your hand flat on a table with your palm facing down. Make sure your wrist stays straight as you do this exercise. Spread your fingers and thumb apart from each other as far as you can until you feel a gentle stretch. Hold this position for __________ seconds. Bring your fingers and thumb tight together again. Hold this position for __________ seconds. Repeat this exercise 5-10 times with each hand. Making circles  Stand or sit with your arm, hand, and all five fingers pointed straight up. Make sure to keep your wrist straight during the exercise. Make a circle by touching the tip of your thumb to the tip of your index finger. Hold for __________ seconds. Then open your hand wide. Repeat this motion with your thumb and each finger on your hand. Repeat this exercise 5-10 times with each hand. Thumb motion  Sit with your forearm resting on a table and your wrist straight. Your thumb should be facing up toward the ceiling. Keep your fingers relaxed as you move your thumb. Lift your thumb up as high as you can toward the ceiling. Hold for __________ seconds. Bend your thumb across your palm as far as you can, reaching the tip of your thumb for the small finger  (pinkie) side of your palm. Hold for __________ seconds. Repeat this exercise 5-10 times with each hand. Grip strengthening  Hold a stress ball or other soft ball in the middle of your hand. Slowly increase the pressure, squeezing the ball as much as you can without causing pain. Think of bringing the tips of your fingers into the middle of your palm. All of your finger joints should bend when doing this exercise. Hold your squeeze for __________ seconds, then relax. Repeat this exercise 5-10 times with each hand. Contact a health care provider if: Your hand pain or discomfort gets much worse when you do an exercise. Your hand pain or discomfort does not improve within 2 hours after you exercise. If you have any of these problems, stop doing these exercises right away. Do not do them again unless your health care provider says that you can. Get help right away if: You develop sudden, severe hand pain or swelling. If this happens, stop doing these exercises right away. Do not do them again unless your health care provider says that you can. This information is not  intended to replace advice given to you by your health care provider. Make sure you discuss any questions you have with your health care provider. Document Revised: 01/07/2021 Document Reviewed: 01/07/2021 Elsevier Patient Education  [place].

## 2021-09-10 ENCOUNTER — Ambulatory Visit (INDEPENDENT_AMBULATORY_CARE_PROVIDER_SITE_OTHER): Payer: 59 | Admitting: Rheumatology

## 2021-09-10 ENCOUNTER — Other Ambulatory Visit: Payer: Self-pay

## 2021-09-10 ENCOUNTER — Other Ambulatory Visit: Payer: Self-pay | Admitting: Pharmacist

## 2021-09-10 ENCOUNTER — Other Ambulatory Visit (HOSPITAL_COMMUNITY): Payer: Self-pay

## 2021-09-10 ENCOUNTER — Ambulatory Visit: Payer: Self-pay

## 2021-09-10 DIAGNOSIS — M79644 Pain in right finger(s): Secondary | ICD-10-CM | POA: Diagnosis not present

## 2021-09-10 MED ORDER — GOLIMUMAB 50 MG/0.5ML ~~LOC~~ SOAJ
SUBCUTANEOUS | 0 refills | Status: DC
  Filled 2021-09-10: qty 0.5, 30d supply, fill #0
  Filled 2021-10-11: qty 0.5, 30d supply, fill #1
  Filled 2021-11-03: qty 0.5, 30d supply, fill #2

## 2021-09-10 MED ORDER — LIDOCAINE HCL 1 % IJ SOLN
0.5000 mL | INTRAMUSCULAR | Status: AC | PRN
Start: 2021-09-10 — End: 2021-09-10
  Administered 2021-09-10: .5 mL

## 2021-09-10 MED ORDER — TRIAMCINOLONE ACETONIDE 40 MG/ML IJ SUSP
10.0000 mg | INTRAMUSCULAR | Status: AC | PRN
  Administered 2021-09-10: 10 mg via INTRA_ARTICULAR

## 2021-09-10 NOTE — Progress Notes (Signed)
   Procedure Note  Patient: Sharon Hunter             Date of Birth: 02-02-1961           MRN: [id number]             Visit Date: 09/10/2021  Procedures: Visit Diagnoses:  1. Pain of right thumb   Patient has been experiencing severe pain and discomfort in her right CMC joint.  She requested a right CMC injection.  Ultrasound guided injection is preferred based studies that show increased duration, increased effect, greater accuracy, decreased procedural pain, increased response rate, and decreased cost with ultrasound guided versus blind injection.   Verbal informed consent obtained.  Time-out conducted.  Noted no overlying erythema, induration, or other signs of local infection. Ultrasound-guided right CMC joint: After sterile prep with Betadine, injected 0.7 mL of 1% lidocaine and 10 mg Kenalog using a 27 needle, right CMC joint was approached under ultrasound guidance.    Small Joint Inj: R thumb CMC on 09/10/2021 12:22 PM Indications: pain Details: 27 G needle, ultrasound-guided radial approach  Spinal Needle: No  Medications: 10 mg triamcinolone acetonide 40 MG/ML; 0.5 mL lidocaine 1 % Aspirate: 0 mL Outcome: tolerated well, no immediate complications Procedure, treatment alternatives, risks and benefits explained, specific risks discussed. Consent was given by the patient. Immediately prior to procedure a time out was called to verify the correct patient, procedure, equipment, support staff and site/side marked as required. Patient was prepped and draped in the usual sterile fashion.    Postprocedure instructions were given.  A prescription for right CMC brace was given.  Bo Merino, MD

## 2021-09-13 ENCOUNTER — Ambulatory Visit: Payer: 59 | Admitting: Family Medicine

## 2021-09-13 ENCOUNTER — Other Ambulatory Visit (HOSPITAL_COMMUNITY): Payer: Self-pay

## 2021-09-17 ENCOUNTER — Other Ambulatory Visit (HOSPITAL_COMMUNITY): Payer: Self-pay

## 2021-09-22 ENCOUNTER — Telehealth: Payer: Self-pay | Admitting: Family Medicine

## 2021-09-22 ENCOUNTER — Other Ambulatory Visit (HOSPITAL_COMMUNITY): Payer: Self-pay

## 2021-09-22 DIAGNOSIS — I10 Essential (primary) hypertension: Secondary | ICD-10-CM

## 2021-09-22 MED ORDER — LOSARTAN POTASSIUM 100 MG PO TABS
100.0000 mg | ORAL_TABLET | Freq: Every day | ORAL | 2 refills | Status: DC
  Filled 2021-09-22: qty 30, 30d supply, fill #0
  Filled 2021-10-25: qty 30, 30d supply, fill #1
  Filled 2021-11-28: qty 30, 30d supply, fill #2

## 2021-09-22 NOTE — Telephone Encounter (Signed)
Medication: Rx #: [id number]  losartan (COZAAR) 100 MG tablet   Has the patient contacted their pharmacy? No. (If no, request that the patient contact the pharmacy for the refill.) (If yes, when and what did the pharmacy advise?)  Preferred Pharmacy (with phone number or street name):  [facility]  [address], [place] [address]  Phone:  [phone]  Fax:  [phone]  DEA #:  [id number]  Agent: Please be advised that RX refills may take up to 3 business days. We ask that you follow-up with your pharmacy.

## 2021-09-22 NOTE — Telephone Encounter (Signed)
Refills sent

## 2021-09-23 ENCOUNTER — Other Ambulatory Visit (HOSPITAL_COMMUNITY): Payer: Self-pay

## 2021-10-11 ENCOUNTER — Ambulatory Visit: Payer: 59 | Admitting: Family Medicine

## 2021-10-11 ENCOUNTER — Encounter: Payer: Self-pay | Admitting: Family Medicine

## 2021-10-11 ENCOUNTER — Other Ambulatory Visit (HOSPITAL_COMMUNITY): Payer: Self-pay

## 2021-10-11 VITALS — BP 130/88 | HR 64 | Temp 97.9°F | Resp 18 | Ht 67.0 in | Wt 214.4 lb

## 2021-10-11 DIAGNOSIS — F419 Anxiety disorder, unspecified: Secondary | ICD-10-CM | POA: Diagnosis not present

## 2021-10-11 DIAGNOSIS — I10 Essential (primary) hypertension: Secondary | ICD-10-CM | POA: Diagnosis not present

## 2021-10-11 MED ORDER — HYDROCHLOROTHIAZIDE 25 MG PO TABS
25.0000 mg | ORAL_TABLET | Freq: Every day | ORAL | 3 refills | Status: DC
  Filled 2021-10-11: qty 90, 90d supply, fill #0
  Filled 2021-12-29: qty 90, 90d supply, fill #1

## 2021-10-11 MED ORDER — ALPRAZOLAM 0.25 MG PO TABS
0.2500 mg | ORAL_TABLET | Freq: Two times a day (BID) | ORAL | 0 refills | Status: DC | PRN
  Filled 2021-10-11: qty 20, 10d supply, fill #0

## 2021-10-11 NOTE — Progress Notes (Signed)
Subjective:   By signing my name below, I, Zite Okoli, attest that this documentation has been prepared under the direction and in the presence of [name], DO. 10/11/2021   Patient ID: Sharon Hunter, female    DOB: 27-Mar-1961, 61 y.o.   MRN: [id number]  Chief Complaint  Patient presents with   Hypertension   Follow-up    HPI Patient is in today for an office visit.  Her blood pressure reading is borderline at this visit and she reports she has been very stressed. She also mentions that she gained weight when she started 50 mg Zoloft and tried to reduce her dosage but could not. She thinks the blood pressure medication is not working. She acknowledges that she has some family issues that are stressing her without support.  BP Readings from Last 3 Encounters:  10/11/21 130/88  08/23/21 (!) 150/81  08/13/21 138/89    Blood pressure recheck- 150/100.  She does not want to increase the Zoloft dosage but would like to start xanax because she has been unable to get sleep for the past 2 nights. She has been using melatonin but it is providing little relief. She is also trying to take walks to get relaxed.  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   Hypertension    IBS (irritable bowel syndrome) 08/01/2016   Migraine    Murmur 02/03/2011   Ovarian failure    Pulmonary nodule, left 2009   last CT Nov 2011-- due 08/2011   Rheumatoid arthritis(714.0)    in remission , off meds as of Summer 2012    Past Surgical History:  Procedure Laterality Date   CHOLECYSTECTOMY  1999   COLONOSCOPY  10/31/11   EXPLORATORY LAPAROTOMY     TONSILLECTOMY  1969   WISDOM TOOTH EXTRACTION      Family History  Problem Relation Age of Onset   Alcohol abuse Father    Hyperlipidemia Father    Heart disease Father    Alcohol abuse Brother    Diabetes Brother    Hypertension Brother     Arthritis Mother    Uterine cancer Mother    Hypertension Mother    Heart disease Sister        cardiac stent   Diabetes Sister    Hyperlipidemia Sister    Hypertension Sister    Cancer Brother        lung   Cancer Maternal Aunt        lung----non smoker    Social History   Socioeconomic History   Marital status: Single    Spouse name: Not on file   Number of children: 1   Years of education: 86   Highest education level: Not on file  Occupational History   Not on file  Tobacco Use   Smoking status: Former    Types: Cigarettes    Quit date: 10/03/1990    Years since quitting: 31.0   Smokeless tobacco: Never  Vaping Use   Vaping Use: Never used  Substance and Sexual Activity   Alcohol use: Yes    Alcohol/week: 3.0 standard drinks    Types: 3 Standard drinks or equivalent per week    Comment: Red wine 4 oz 4-5 times a week   Drug use: No   Sexual activity: Not Currently    Partners: Male  Other  Topics Concern   Not on file  Social History Narrative   Exercise-- jogs   Social Determinants of Health   Financial Resource Strain: Not on file  Food Insecurity: Not on file  Transportation Needs: Not on file  Physical Activity: Not on file  Stress: Not on file  Social Connections: Not on file  Intimate Partner Violence: Not on file    Outpatient Medications Prior to Visit  Medication Sig Dispense Refill   cycloSPORINE, PF, (CEQUA) 0.09 % SOLN Place 1 drop into both eyes 2 (two) times daily. 60 each 2   diclofenac sodium (VOLTAREN) 1 % GEL APPLY 3 GRAMS TO LARGE JOINTS THREE TIMES DAILY AS NEEDED FOR PAIN 300 g 3   Doxylamine Succinate, Sleep, (UNISOM PO) Take 0.5 tablets by mouth at bedtime.     Golimumab 50 MG/0.5ML SOAJ INJECT 50 MG INTO THE SKIN EVERY 30 DAYS 1.5 mL 0   Ibuprofen (MOTRIN PO) Take by mouth as needed.     losartan (COZAAR) 100 MG tablet Take 1 tablet (100 mg total) by mouth daily. 30 tablet 2   MELATONIN PO Take 5-10 mg by mouth at bedtime.      mometasone (NASONEX) 50 MCG/ACT nasal spray INSTILL 2 SPRAYS INTO THE NOSE DAILY 17 g 12   sertraline (ZOLOFT) 50 MG tablet TAKE 1 & 1/2 TABLETS BY MOUTH EVERY DAY 135 tablet 3   EYSUVIS 0.25 % SUSP Instill 1 drop in both eyes 4 times a day for 7 days, then 2 times a day for 7 days, then stop (Patient not taking: Reported on 08/23/2021) 8.3 mL 0   No facility-administered medications prior to visit.    Allergies  Allergen Reactions   Other Swelling    Contrast dye- head swelling per pt.   Contrast Media [Iodinated Contrast Media]     Review of Systems  Constitutional:  Negative for fever.  HENT:  Negative for congestion, ear pain, hearing loss, sinus pain and sore throat.   Eyes:  Negative for blurred vision and pain.  Respiratory:  Negative for cough, sputum production, shortness of breath and wheezing.   Cardiovascular:  Negative for chest pain and palpitations.  Gastrointestinal:  Negative for blood in stool, constipation, diarrhea, nausea and vomiting.  Genitourinary:  Negative for dysuria, frequency, hematuria and urgency.  Musculoskeletal:  Negative for back pain, falls and myalgias.  Neurological:  Negative for dizziness, sensory change, loss of consciousness, weakness and headaches.  Endo/Heme/Allergies:  Negative for environmental allergies. Does not bruise/bleed easily.  Psychiatric/Behavioral:  Negative for depression and suicidal ideas. The patient is not nervous/anxious and does not have insomnia.       Objective:    Physical Exam Constitutional:      General: She is not in acute distress.    Appearance: Normal appearance. She is not ill-appearing.  HENT:     Head: Normocephalic and atraumatic.     Right Ear: External ear normal.     Left Ear: External ear normal.  Eyes:     Extraocular Movements: Extraocular movements intact.     Pupils: Pupils are equal, round, and reactive to light.  Cardiovascular:     Rate and Rhythm: Normal rate and regular rhythm.      Pulses: Normal pulses.     Heart sounds: Normal heart sounds. No murmur heard.   No gallop.  Pulmonary:     Effort: Pulmonary effort is normal. No respiratory distress.     Breath sounds: Normal breath sounds. No wheezing,  rhonchi or rales.  Abdominal:     General: Bowel sounds are normal. There is no distension.     Palpations: Abdomen is soft. There is no mass.     Tenderness: There is no abdominal tenderness. There is no guarding or rebound.     Hernia: No hernia is present.  Musculoskeletal:     Cervical back: Normal range of motion and neck supple.  Lymphadenopathy:     Cervical: No cervical adenopathy.  Skin:    General: Skin is warm and dry.  Neurological:     Mental Status: She is alert and oriented to person, place, and time.  Psychiatric:        Behavior: Behavior normal.    BP 130/88 (BP Location: Left Arm, Patient Position: Sitting, Cuff Size: Normal)    Pulse 64    Temp 97.9 F (36.6 C) (Oral)    Resp 18    Ht _0  (1.702 m)    Wt 214 lb 6.4 oz (97.3 kg)    LMP 10/03/2005    SpO2 98%    BMI 33.58 kg/m  Wt Readings from Last 3 Encounters:  10/11/21 214 lb 6.4 oz (97.3 kg)  08/23/21 213 lb 3.2 oz (96.7 kg)  08/13/21 215 lb 9.6 oz (97.8 kg)    Diabetic Foot Exam - Simple   No data filed    Lab Results  Component Value Date   WBC 8.5 08/19/2021   HGB 13.7 08/19/2021   HCT 40.1 08/19/2021   PLT 315 08/19/2021   GLUCOSE 88 08/19/2021   CHOL 270 (H) 09/15/2020   TRIG 116.0 09/15/2020   HDL 100.20 09/15/2020   LDLDIRECT 132.6 05/23/2013   LDLCALC 147 (H) 09/15/2020   ALT 19 08/19/2021   AST 12 08/19/2021   NA 137 08/19/2021   K 4.6 08/19/2021   CL 102 08/19/2021   CREATININE 0.76 08/19/2021   BUN 18 08/19/2021   CO2 26 08/19/2021   TSH 1.20 09/15/2020    Lab Results  Component Value Date   TSH 1.20 09/15/2020   Lab Results  Component Value Date   WBC 8.5 08/19/2021   HGB 13.7 08/19/2021   HCT 40.1 08/19/2021   MCV 92.6 08/19/2021   PLT 315  08/19/2021   Lab Results  Component Value Date   NA 137 08/19/2021   K 4.6 08/19/2021   CO2 26 08/19/2021   GLUCOSE 88 08/19/2021   BUN 18 08/19/2021   CREATININE 0.76 08/19/2021   BILITOT 0.6 08/19/2021   ALKPHOS 58 03/27/2017   AST 12 08/19/2021   ALT 19 08/19/2021   PROT 7.1 08/19/2021   ALBUMIN 4.3 03/27/2017   CALCIUM 9.7 08/19/2021   EGFR 90 08/19/2021   GFR 84.35 01/26/2015   Lab Results  Component Value Date   CHOL 270 (H) 09/15/2020   Lab Results  Component Value Date   HDL 100.20 09/15/2020   Lab Results  Component Value Date   LDLCALC 147 (H) 09/15/2020   Lab Results  Component Value Date   TRIG 116.0 09/15/2020   Lab Results  Component Value Date   CHOLHDL 3 09/15/2020   No results found for: HGBA1C     Assessment & Plan:   Problem List Items Addressed This Visit       Unprioritized   Anxiety    con't zoloft Add prn xanax       Relevant Medications   ALPRAZolam (XANAX) 0.25 MG tablet   Hypertension - Primary  Poorly controlled will alter medications, encouraged DASH diet, minimize caffeine and obtain adequate sleep. Report concerning symptoms and follow up as directed and as needed con't losartan and add hctz  rto 2-3 weeks for bp check      Relevant Medications   hydrochlorothiazide (HYDRODIURIL) 25 MG tablet   Other Relevant Orders   Lipid panel     Meds ordered this encounter  Medications   hydrochlorothiazide (HYDRODIURIL) 25 MG tablet    Sig: Take 1 tablet (25 mg total) by mouth daily.    Dispense:  90 tablet    Refill:  3   ALPRAZolam (XANAX) 0.25 MG tablet    Sig: Take 1 tablet (0.25 mg total) by mouth 2 (two) times daily as needed for anxiety.    Dispense:  20 tablet    Refill:  0    I,Zite Okoli,acting as a scribe for [employer], DO.,have documented all relevant documentation on the behalf of [name], DO,as directed by  [name], DO while in the presence of [name],  DO.   I, [name], DO. , personally preformed the services described in this documentation.  All medical record entries made by the scribe were at my direction and in my presence.  I have reviewed the chart and discharge instructions (if applicable) and agree that the record reflects my personal performance and is accurate and complete. 10/11/2021

## 2021-10-11 NOTE — Patient Instructions (Signed)

## 2021-10-12 ENCOUNTER — Other Ambulatory Visit (HOSPITAL_COMMUNITY): Payer: Self-pay

## 2021-10-13 ENCOUNTER — Other Ambulatory Visit (HOSPITAL_COMMUNITY): Payer: Self-pay

## 2021-10-13 ENCOUNTER — Encounter (INDEPENDENT_AMBULATORY_CARE_PROVIDER_SITE_OTHER): Payer: Self-pay

## 2021-10-15 ENCOUNTER — Other Ambulatory Visit (HOSPITAL_COMMUNITY): Payer: Self-pay

## 2021-10-15 NOTE — Assessment & Plan Note (Signed)
Poorly controlled will alter medications, encouraged DASH diet, minimize caffeine and obtain adequate sleep. Report concerning symptoms and follow up as directed and as needed con't losartan and add hctz  rto 2-3 weeks for bp check

## 2021-10-15 NOTE — Assessment & Plan Note (Signed)
con't zoloft Add prn xanax

## 2021-10-25 ENCOUNTER — Other Ambulatory Visit (HOSPITAL_COMMUNITY): Payer: Self-pay

## 2021-10-29 ENCOUNTER — Ambulatory Visit (INDEPENDENT_AMBULATORY_CARE_PROVIDER_SITE_OTHER): Payer: 59

## 2021-10-29 ENCOUNTER — Other Ambulatory Visit (INDEPENDENT_AMBULATORY_CARE_PROVIDER_SITE_OTHER): Payer: 59

## 2021-10-29 DIAGNOSIS — Z8679 Personal history of other diseases of the circulatory system: Secondary | ICD-10-CM

## 2021-10-29 DIAGNOSIS — I10 Essential (primary) hypertension: Secondary | ICD-10-CM | POA: Diagnosis not present

## 2021-10-29 LAB — LIPID PANEL
Cholesterol: 258 mg/dL — ABNORMAL HIGH (ref 0–200)
HDL: 86.6 mg/dL (ref >39.00)
LDL Cholesterol: 151 mg/dL — ABNORMAL HIGH (ref 0–99)
NonHDL: 171.3
Total CHOL/HDL Ratio: 3
Triglycerides: 100 mg/dL (ref 0.0–149.0)
VLDL: 20 mg/dL (ref 0.0–40.0)

## 2021-10-29 NOTE — Progress Notes (Signed)
Pt here for Blood pressure check per Dr.Lowne-Chase  Pt currently takes: Losartan 100 mg & Hydrodiuril 25 mg   Pt reports compliance with medication.   BP today @ =118/76 HR = 58  Pt advised per PCP continue medication regiment and to follow up in 3 months.

## 2021-11-03 ENCOUNTER — Other Ambulatory Visit (HOSPITAL_COMMUNITY): Payer: Self-pay

## 2021-11-08 ENCOUNTER — Other Ambulatory Visit (HOSPITAL_COMMUNITY): Payer: Self-pay

## 2021-11-11 ENCOUNTER — Other Ambulatory Visit (HOSPITAL_COMMUNITY): Payer: Self-pay

## 2021-11-14 ENCOUNTER — Encounter: Payer: Self-pay | Admitting: Family Medicine

## 2021-11-16 ENCOUNTER — Ambulatory Visit: Payer: 59 | Admitting: Medical

## 2021-11-16 ENCOUNTER — Encounter: Payer: Self-pay | Admitting: Medical

## 2021-11-16 ENCOUNTER — Other Ambulatory Visit (HOSPITAL_BASED_OUTPATIENT_CLINIC_OR_DEPARTMENT_OTHER): Payer: Self-pay

## 2021-11-16 VITALS — BP 158/86 | HR 58 | Temp 97.6°F | Resp 18 | Ht 67.0 in

## 2021-11-16 DIAGNOSIS — I10 Essential (primary) hypertension: Secondary | ICD-10-CM | POA: Diagnosis not present

## 2021-11-16 DIAGNOSIS — L989 Disorder of the skin and subcutaneous tissue, unspecified: Secondary | ICD-10-CM

## 2021-11-16 MED ORDER — AMLODIPINE BESYLATE 2.5 MG PO TABS
2.5000 mg | ORAL_TABLET | Freq: Every day | ORAL | 0 refills | Status: DC
  Filled 2021-11-16: qty 30, 30d supply, fill #0

## 2021-11-16 NOTE — Patient Instructions (Signed)
Skin lesion on top of scalp. Decided to go ahead and refer you to dermatologist for evaluation and treatment. If you get prolonged/far out appointment let me know could try to refer to different location.  For htn/increased bp recently will add on low dose amlodiine 2.5 mg daily. Update me in one week on bp readings. May need to be on 5 mg daily. Try to lose weight, low salt diet and try to walk some daily.   Follow up in 1-2 week or sooner if needed

## 2021-11-16 NOTE — Progress Notes (Signed)
Subjective:    Patient ID: Sharon Hunter, female    DOB: 04-02-1961, 61 y.o.   MRN: [id number]  HPI  Top of head. Pt states small bump which she felt one year ago when she would wash her hair. Was like small pimple in size but now has gotten bigger in past 2-3 months.   Pt bp elevated. Pt is hctz and losartan. Last 3 days bp 150-160/90 range. 3 times today has been in this range.    Review of Systems  Constitutional:  Negative for chills, fatigue and fever.  Respiratory:  Negative for cough, chest tightness, shortness of breath and wheezing.   Cardiovascular:  Negative for chest pain and palpitations.  Gastrointestinal:  Negative for abdominal pain.  Musculoskeletal:  Negative for back pain.  Skin:        Skin lesion  Neurological:  Positive for headaches. Negative for dizziness and numbness.  Hematological:  Negative for adenopathy. Does not bruise/bleed easily.    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   Hypertension    IBS (irritable bowel syndrome) 08/01/2016   Migraine    Murmur 02/03/2011   Ovarian failure    Pulmonary nodule, left 2009   last CT Nov 2011-- due 08/2011   Rheumatoid arthritis(714.0)    in remission , off meds as of Summer 2012     Social History   Socioeconomic History   Marital status: Single    Spouse name: Not on file   Number of children: 1   Years of education: 17   Highest education level: Not on file  Occupational History   Not on file  Tobacco Use   Smoking status: Former    Types: Cigarettes    Quit date: 10/03/1990    Years since quitting: 31.1   Smokeless tobacco: Never  Vaping Use   Vaping Use: Never used  Substance and Sexual Activity   Alcohol use: Yes    Alcohol/week: 3.0 standard drinks    Types: 3 Standard drinks or equivalent per week    Comment: Red wine 4 oz 4-5 times a week   Drug use: No   Sexual activity:  Not Currently    Partners: Male  Other Topics Concern   Not on file  Social History Narrative   Exercise-- jogs   Social Determinants of Health   Financial Resource Strain: Not on file  Food Insecurity: Not on file  Transportation Needs: Not on file  Physical Activity: Not on file  Stress: Not on file  Social Connections: Not on file  Intimate Partner Violence: Not on file    Past Surgical History:  Procedure Laterality Date   CHOLECYSTECTOMY  1999   COLONOSCOPY  10/31/11   EXPLORATORY LAPAROTOMY     TONSILLECTOMY  1969   WISDOM TOOTH EXTRACTION      Family History  Problem Relation Age of Onset   Alcohol abuse Father    Hyperlipidemia Father    Heart disease Father    Alcohol abuse Brother    Diabetes Brother    Hypertension Brother    Arthritis Mother    Uterine cancer Mother    Hypertension Mother    Heart disease Sister        cardiac stent   Diabetes Sister    Hyperlipidemia Sister    Hypertension Sister    Cancer Brother  lung   Cancer Maternal Aunt        lung----non smoker    Allergies  Allergen Reactions   Other Swelling    Contrast dye- head swelling per pt.   Contrast Media [Iodinated Contrast Media]     Current Outpatient Medications on File Prior to Visit  Medication Sig Dispense Refill   ALPRAZolam (XANAX) 0.25 MG tablet Take 1 tablet (0.25 mg total) by mouth 2 (two) times daily as needed for anxiety. 20 tablet 0   cycloSPORINE, PF, (CEQUA) 0.09 % SOLN Place 1 drop into both eyes 2 (two) times daily. 60 each 2   diclofenac sodium (VOLTAREN) 1 % GEL APPLY 3 GRAMS TO LARGE JOINTS THREE TIMES DAILY AS NEEDED FOR PAIN 300 g 3   Doxylamine Succinate, Sleep, (UNISOM PO) Take 0.5 tablets by mouth at bedtime.     EYSUVIS 0.25 % SUSP Instill 1 drop in both eyes 4 times a day for 7 days, then 2 times a day for 7 days, then stop 8.3 mL 0   Golimumab 50 MG/0.5ML SOAJ INJECT 50 MG INTO THE SKIN EVERY 30 DAYS 1.5 mL 0   hydrochlorothiazide  (HYDRODIURIL) 25 MG tablet Take 1 tablet (25 mg total) by mouth daily. 90 tablet 3   Ibuprofen (MOTRIN PO) Take by mouth as needed.     losartan (COZAAR) 100 MG tablet Take 1 tablet (100 mg total) by mouth daily. 30 tablet 2   MELATONIN PO Take 5-10 mg by mouth at bedtime.     sertraline (ZOLOFT) 50 MG tablet TAKE 1 & 1/2 TABLETS BY MOUTH EVERY DAY 135 tablet 3   mometasone (NASONEX) 50 MCG/ACT nasal spray INSTILL 2 SPRAYS INTO THE NOSE DAILY 17 g 12   No current facility-administered medications on file prior to visit.    BP (!) 151/70    Pulse (!) 58    Temp 97.6 F (36.4 C)    Resp 18    Ht 5\' 7"  (1.702 m)    LMP 10/03/2005    SpO2 100%    BMI 33.58 kg/m       Objective:   Physical Exam  General- No acute distress. Pleasant patient. Neck- Full range of motion, no jvd Lungs- Clear, even and unlabored. Heart- regular rate and rhythm. Neurologic- CNII- XII grossly intact.      Assessment & Plan:   Patient Instructions  Skin lesion on top of scalp. Decided to go ahead and refer you to dermatologist for evaluation and treatment. If you get prolonged/far out appointment let me know could try to refer to different location.  For htn/increased bp recently will add on low dose amlodiine 2.5 mg daily. Update me in one week on bp readings. May need to be on 5 mg daily. Try to lose weight, low salt diet and try to walk some daily.   Follow up in 1-2 week or sooner if needed   [employer], PA-C

## 2021-11-22 ENCOUNTER — Encounter: Payer: Self-pay | Admitting: Medical

## 2021-11-22 ENCOUNTER — Other Ambulatory Visit (HOSPITAL_COMMUNITY): Payer: Self-pay

## 2021-11-22 ENCOUNTER — Telehealth: Payer: Self-pay | Admitting: Medical

## 2021-11-22 MED ORDER — AMLODIPINE BESYLATE 5 MG PO TABS
5.0000 mg | ORAL_TABLET | Freq: Every day | ORAL | 0 refills | Status: DC
  Filled 2021-11-22: qty 90, 90d supply, fill #0

## 2021-11-22 NOTE — Telephone Encounter (Signed)
Chart opened to rx med, order lab, review chart, respond to my chart message or send message to staff member. ° °Keala Drum, PA-C  °

## 2021-11-22 NOTE — Telephone Encounter (Signed)
Can you check on the status of this please

## 2021-11-24 NOTE — Telephone Encounter (Signed)
Please send somewhere else

## 2021-11-28 ENCOUNTER — Other Ambulatory Visit: Payer: Self-pay | Admitting: Family Medicine

## 2021-11-28 DIAGNOSIS — F419 Anxiety disorder, unspecified: Secondary | ICD-10-CM

## 2021-11-29 ENCOUNTER — Other Ambulatory Visit: Payer: Self-pay | Admitting: Rheumatology

## 2021-11-29 ENCOUNTER — Other Ambulatory Visit (HOSPITAL_COMMUNITY): Payer: Self-pay

## 2021-11-29 MED ORDER — SERTRALINE HCL 50 MG PO TABS
75.0000 mg | ORAL_TABLET | Freq: Every day | ORAL | 3 refills | Status: DC
  Filled 2021-11-29: qty 135, 90d supply, fill #0

## 2021-12-01 ENCOUNTER — Ambulatory Visit: Payer: 59 | Attending: Family Medicine | Admitting: Pharmacist

## 2021-12-01 ENCOUNTER — Other Ambulatory Visit (HOSPITAL_COMMUNITY): Payer: Self-pay

## 2021-12-01 ENCOUNTER — Encounter (INDEPENDENT_AMBULATORY_CARE_PROVIDER_SITE_OTHER): Payer: Self-pay

## 2021-12-01 ENCOUNTER — Other Ambulatory Visit: Payer: Self-pay

## 2021-12-01 ENCOUNTER — Other Ambulatory Visit: Payer: Self-pay | Admitting: Rheumatology

## 2021-12-01 DIAGNOSIS — Z79899 Other long term (current) drug therapy: Secondary | ICD-10-CM

## 2021-12-01 MED ORDER — GOLIMUMAB 50 MG/0.5ML ~~LOC~~ SOAJ
SUBCUTANEOUS | 0 refills | Status: DC
  Filled 2021-12-01: qty 0.5, fill #0

## 2021-12-01 MED ORDER — GOLIMUMAB 50 MG/0.5ML ~~LOC~~ SOAJ
SUBCUTANEOUS | 0 refills | Status: DC
  Filled 2021-12-01: qty 0.5, fill #0
  Filled 2021-12-01: qty 0.5, 30d supply, fill #0

## 2021-12-01 NOTE — Telephone Encounter (Signed)
Next Visit: 01/18/2022 ? ?Last Visit: 08/23/2021 ? ?Last Fill: 09/10/2021 ? ?LY:[id number] arthritis involving multiple sites with positive rheumatoid factor ? ?Current Dose per office note 08/23/2021: Simponi 50 mg sq injections every 28 days ? ?Labs: 08/19/2021 CBC and CMP normal. ? ?TB Gold: TB Gold negative on 09/01/2020  ? ?Patient advised she is due to update labs. Patient advised we have no received most recent TB Gold results. Patient will have health at work resend them.  ? ?Okay to refill Simponi?  ?

## 2021-12-01 NOTE — Progress Notes (Signed)
S: ?Patient is currently taking Simponi for rheumatoid arthritis. Patient is managed by Dr. Estanislado Pandy for this.  ? ?Adherence: denies any missed doses recently  ? ?Efficacy: continues to work well for her. ? ?Dosing:  ?SubQ: 50 mg once a month  ? ?Drug-drug interactions: none ? ?Screening: ?TB test: completed yearly at work ?Hepatitis: completed ? ?Monitoring: ?S/sx of infection: denies ?CBC: WNL  ?S/sx of hypersensitivity: denies ?S/sx of malignancy: denies ?S/sx of heart failure: denies ?S/sx of autoimmune disorder: denies ? ? ?O: ?   ? ?Lab Results  ?Component Value Date  ? WBC 8.5 08/19/2021  ? HGB 13.7 08/19/2021  ? HCT 40.1 08/19/2021  ? MCV 92.6 08/19/2021  ? PLT 315 08/19/2021  ? ? ?  Chemistry   ?   ?Component Value Date/Time  ? NA 137 08/19/2021 1354  ? NA 138 02/04/2016 0000  ? K 4.6 08/19/2021 1354  ? CL 102 08/19/2021 1354  ? CO2 26 08/19/2021 1354  ? BUN 18 08/19/2021 1354  ? BUN 20 02/04/2016 0000  ? CREATININE 0.76 08/19/2021 1354  ? GLU 88 02/04/2016 0000  ?    ?Component Value Date/Time  ? CALCIUM 9.7 08/19/2021 1354  ? ALKPHOS 58 03/27/2017 1341  ? AST 12 08/19/2021 1354  ? ALT 19 08/19/2021 1354  ? BILITOT 0.6 08/19/2021 1354  ?  ? ? ? ?A/P: ?1. Medication review: Patient tolerating Simponi well with no adverse effects. Reviewed the medication with the patient, including the following: Simponi, golimumab, is a TNF? blocker. There is an increased risk of infection and malignancy with this medication. Do not give patients live vaccinations while they are on this medication. No changes recommended at this time.  ? ?Benard Halsted, PharmD, BCACP, CPP ?Clinical Pharmacist ?[facility] ?[phone] ? ? ? ? ? ? ? ? ?

## 2021-12-06 ENCOUNTER — Other Ambulatory Visit: Payer: Self-pay

## 2021-12-06 ENCOUNTER — Other Ambulatory Visit (HOSPITAL_COMMUNITY): Payer: Self-pay

## 2021-12-06 ENCOUNTER — Telehealth: Payer: Self-pay

## 2021-12-06 DIAGNOSIS — M0579 Rheumatoid arthritis with rheumatoid factor of multiple sites without organ or systems involvement: Secondary | ICD-10-CM | POA: Diagnosis not present

## 2021-12-06 DIAGNOSIS — Z79899 Other long term (current) drug therapy: Secondary | ICD-10-CM

## 2021-12-06 NOTE — Telephone Encounter (Addendum)
Date of lab draw: 07/27/2021 ? ?Labs drawn: Quantiferon TB Gold ? ?Result: negative  ? ?Reviewed by Hazel Sams, PA-C.  ?

## 2021-12-07 LAB — CBC WITH DIFFERENTIAL/PLATELET
Absolute Monocytes: 688 cells/uL (ref 200–950)
Basophils Absolute: 56 cells/uL (ref 0–200)
Basophils Relative: 0.7 %
Eosinophils Absolute: 200 cells/uL (ref 15–500)
Eosinophils Relative: 2.5 %
HCT: 38.8 % (ref 35.0–45.0)
Hemoglobin: 13 g/dL (ref 11.7–15.5)
Lymphs Abs: 2912 cells/uL (ref 850–3900)
MCH: 30.8 pg (ref 27.0–33.0)
MCHC: 33.5 g/dL (ref 32.0–36.0)
MCV: 91.9 fL (ref 80.0–100.0)
MPV: 10.5 fL (ref 7.5–12.5)
Monocytes Relative: 8.6 %
Neutro Abs: 4144 cells/uL (ref 1500–7800)
Neutrophils Relative %: 51.8 %
Platelets: 301 10*3/uL (ref 140–400)
RBC: 4.22 10*6/uL (ref 3.80–5.10)
RDW: 12.6 % (ref 11.0–15.0)
Total Lymphocyte: 36.4 %
WBC: 8 10*3/uL (ref 3.8–10.8)

## 2021-12-07 LAB — COMPLETE METABOLIC PANEL WITH GFR
AG Ratio: 1.8 (calc) (ref 1.0–2.5)
ALT: 19 U/L (ref 6–29)
AST: 14 U/L (ref 10–35)
Albumin: 4.5 g/dL (ref 3.6–5.1)
Alkaline phosphatase (APISO): 61 U/L (ref 37–153)
BUN: 14 mg/dL (ref 7–25)
CO2: 28 mmol/L (ref 20–32)
Calcium: 10 mg/dL (ref 8.6–10.4)
Chloride: 101 mmol/L (ref 98–110)
Creat: 0.73 mg/dL (ref 0.50–1.05)
Globulin: 2.5 g/dL (calc) (ref 1.9–3.7)
Glucose, Bld: 93 mg/dL (ref 65–99)
Potassium: 4.2 mmol/L (ref 3.5–5.3)
Sodium: 138 mmol/L (ref 135–146)
Total Bilirubin: 0.6 mg/dL (ref 0.2–1.2)
Total Protein: 7 g/dL (ref 6.1–8.1)
eGFR: 94 mL/min/{1.73_m2} (ref >=60)

## 2021-12-15 ENCOUNTER — Telehealth: Payer: Self-pay

## 2021-12-15 NOTE — Telephone Encounter (Signed)
Patient called stating her left foot is swollen and warm to touch in two areas, one on the top of her foot and one by her ankle.  Patient states she has been wearing thick socks, but thinks she needs an injection.  Patient states due to her work schedule she is only available on Monday, 3/20 and Friday, 3/24.  Please advise.   ?

## 2021-12-16 NOTE — Telephone Encounter (Signed)
Patient called the office today (3/16) stating she had spoke with Vincente Liberty yesterday in regards to getting ann injection in her foot because she's in a lot of pain. Patient states she has not received a call back and how her hip is giving out because she is having to limp. Patient states her afternoon today is pretty opened and would like to come in today if possible. Please advise. ?

## 2021-12-16 NOTE — Telephone Encounter (Signed)
Spoke with patient and advised our first available appointment will be on January 06, 2022. Patient advised she is welcome to check back to see if anyone cancels prior to that appointment. Patient has been scheduled for 01/06/2022 at 10:30 am.  ?

## 2021-12-20 ENCOUNTER — Encounter: Payer: Self-pay | Admitting: Internal Medicine

## 2021-12-23 ENCOUNTER — Other Ambulatory Visit (HOSPITAL_COMMUNITY): Payer: Self-pay

## 2021-12-27 ENCOUNTER — Other Ambulatory Visit (HOSPITAL_COMMUNITY): Payer: Self-pay

## 2021-12-27 ENCOUNTER — Other Ambulatory Visit: Payer: Self-pay | Admitting: Pharmacist

## 2021-12-27 ENCOUNTER — Other Ambulatory Visit: Payer: Self-pay | Admitting: Physician Assistant

## 2021-12-27 MED ORDER — GOLIMUMAB 50 MG/0.5ML ~~LOC~~ SOAJ
SUBCUTANEOUS | 0 refills | Status: DC
  Filled 2021-12-27: qty 0.5, 30d supply, fill #0
  Filled 2022-01-26: qty 0.5, 30d supply, fill #1
  Filled 2022-02-23 – 2022-02-24 (×2): qty 0.5, 30d supply, fill #2

## 2021-12-27 MED ORDER — GOLIMUMAB 50 MG/0.5ML ~~LOC~~ SOAJ
SUBCUTANEOUS | 0 refills | Status: DC
  Filled 2021-12-27: qty 1.5, fill #0

## 2021-12-27 NOTE — Telephone Encounter (Signed)
Next Visit: 01/18/2022 ?  ?Last Visit: 08/23/2021 ?  ?Last Fill: 12/01/2021 (30 day supply) ?  ?XU:XYBFXOVANV arthritis involving multiple sites with positive rheumatoid factor ?  ?Current Dose per office note 08/23/2021: Simponi 50 mg sq injections every 28 days ?  ?Labs: 12/06/2021 CBC and CMP WNL ?  ?TB Gold: 07/27/2021 Neg  ? ?Okay to refill Simponi?  ?

## 2021-12-29 ENCOUNTER — Other Ambulatory Visit: Payer: Self-pay | Admitting: Family Medicine

## 2021-12-29 ENCOUNTER — Other Ambulatory Visit (HOSPITAL_COMMUNITY): Payer: Self-pay

## 2021-12-29 DIAGNOSIS — I10 Essential (primary) hypertension: Secondary | ICD-10-CM

## 2021-12-29 MED ORDER — LOSARTAN POTASSIUM 100 MG PO TABS
100.0000 mg | ORAL_TABLET | Freq: Every day | ORAL | 2 refills | Status: DC
  Filled 2021-12-29: qty 30, 30d supply, fill #0
  Filled 2022-02-04: qty 30, 30d supply, fill #1

## 2021-12-30 ENCOUNTER — Other Ambulatory Visit (HOSPITAL_COMMUNITY): Payer: Self-pay

## 2022-01-05 NOTE — Progress Notes (Deleted)
Office Visit Note  Patient: Sharon Hunter             Date of Birth: 1960/12/11           MRN: [id number]             PCP: [name], DO Referring: [name], * Visit Date: 01/18/2022 Occupation: '@GUAROCC'$ @  Subjective:  No chief complaint on file.   History of Present Illness: Sharon Hunter is a 61 y.o. female ***   Activities of Daily Living:  Patient reports morning stiffness for *** {minute/hour:19697}.   Patient {ACTIONS;DENIES/REPORTS:21021675::"Denies"} nocturnal pain.  Difficulty dressing/grooming: {ACTIONS;DENIES/REPORTS:21021675::"Denies"} Difficulty climbing stairs: {ACTIONS;DENIES/REPORTS:21021675::"Denies"} Difficulty getting out of chair: {ACTIONS;DENIES/REPORTS:21021675::"Denies"} Difficulty using hands for taps, buttons, cutlery, and/or writing: {ACTIONS;DENIES/REPORTS:21021675::"Denies"}  No Rheumatology ROS completed.   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   Rate-related bundle branch block 01/30/2015   Palpitations 01/30/2015   Pulmonary nodule 07/04/2011   Murmur 02/03/2011   Rheumatoid arthritis ([place]) 02/03/2011   Hair loss 02/03/2011   Fatigue 02/03/2011    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   Hypertension    IBS (irritable bowel syndrome) 08/01/2016   Migraine    Murmur 02/03/2011   Ovarian failure    Pulmonary nodule, left 2009   last CT Nov 2011-- due 08/2011   Rheumatoid  arthritis(714.0)    in remission , off meds as of Summer 2012    Family History  Problem Relation Age of Onset   Alcohol abuse Father    Hyperlipidemia Father    Heart disease Father    Alcohol abuse Brother    Diabetes Brother    Hypertension Brother    Arthritis Mother    Uterine cancer Mother    Hypertension Mother    Heart disease Sister        cardiac stent   Diabetes Sister    Hyperlipidemia Sister    Hypertension Sister    Cancer Brother        lung   Cancer Maternal Aunt        lung----non smoker   Past Surgical History:  Procedure Laterality Date   CHOLECYSTECTOMY  1999   COLONOSCOPY  10/31/11   EXPLORATORY LAPAROTOMY     TONSILLECTOMY  1969   WISDOM TOOTH EXTRACTION     Social History   Social History Narrative   Exercise-- jogs   Immunization History  Administered Date(s) Administered   Influenza Split 07/03/2013   Influenza,inj,Quad PF,6-35 Mos 07/17/2020   Influenza-Unspecified 07/03/2014, 07/04/2015   PFIZER(Purple Top)SARS-COV-2 Vaccination 01/13/2020, 05/15/2020   Tdap 12/01/2009     Objective: Vital Signs: LMP 10/03/2005    Physical Exam   Musculoskeletal Exam: ***  CDAI Exam: CDAI Score: -- Patient Global: --; Provider Global: -- Swollen: --; Tender: -- Joint Exam 01/18/2022   No joint exam has been documented for this visit   There is currently no information  documented on the homunculus. Go to the Rheumatology activity and complete the homunculus joint exam.  Investigation: No additional findings.  Imaging: No results found.  Recent Labs: Lab Results  Component Value Date   WBC 8.0 12/06/2021   HGB 13.0 12/06/2021   PLT 301 12/06/2021   NA 138 12/06/2021   K 4.2 12/06/2021   CL 101 12/06/2021   CO2 28 12/06/2021   GLUCOSE 93 12/06/2021   BUN 14 12/06/2021   CREATININE 0.73 12/06/2021   BILITOT 0.6 12/06/2021   ALKPHOS 58 03/27/2017   AST 14 12/06/2021   ALT 19 12/06/2021   PROT 7.0 12/06/2021   ALBUMIN 4.3  03/27/2017   CALCIUM 10.0 12/06/2021   GFRAA 86 01/07/2021   QFTBGOLDPLUS NEGATIVE 09/12/2018    Speciality Comments: Prior therapy: methotrexate, remicade, orencia, and enbrel (inadequate response)  Procedures:  No procedures performed Allergies: Other and Contrast media [iodinated contrast media]   Assessment / Plan:     Visit Diagnoses: No diagnosis found.  Orders: No orders of the defined types were placed in this encounter.  No orders of the defined types were placed in this encounter.   Face-to-face time spent with patient was *** minutes. Greater than 50% of time was spent in counseling and coordination of care.  Follow-Up Instructions: No follow-ups on file.   Earnestine Mealing, CMA  Note - This record has been created using Editor, commissioning.  Chart creation errors have been sought, but may not always  have been located. Such creation errors do not reflect on  the standard of medical care.

## 2022-01-06 ENCOUNTER — Ambulatory Visit: Payer: 59 | Admitting: Rheumatology

## 2022-01-17 ENCOUNTER — Other Ambulatory Visit (HOSPITAL_COMMUNITY): Payer: Self-pay

## 2022-01-18 ENCOUNTER — Ambulatory Visit: Payer: 59 | Admitting: Rheumatology

## 2022-01-18 ENCOUNTER — Encounter: Payer: Self-pay | Admitting: Family Medicine

## 2022-01-18 ENCOUNTER — Ambulatory Visit (INDEPENDENT_AMBULATORY_CARE_PROVIDER_SITE_OTHER): Payer: 59 | Admitting: Family Medicine

## 2022-01-18 VITALS — BP 111/51 | HR 62 | Temp 98.0°F | Resp 12 | Ht 67.0 in | Wt 200.8 lb

## 2022-01-18 DIAGNOSIS — M0579 Rheumatoid arthritis with rheumatoid factor of multiple sites without organ or systems involvement: Secondary | ICD-10-CM | POA: Diagnosis not present

## 2022-01-18 DIAGNOSIS — R911 Solitary pulmonary nodule: Secondary | ICD-10-CM

## 2022-01-18 DIAGNOSIS — R5383 Other fatigue: Secondary | ICD-10-CM

## 2022-01-18 DIAGNOSIS — Z8619 Personal history of other infectious and parasitic diseases: Secondary | ICD-10-CM

## 2022-01-18 DIAGNOSIS — M19041 Primary osteoarthritis, right hand: Secondary | ICD-10-CM

## 2022-01-18 DIAGNOSIS — Z8639 Personal history of other endocrine, nutritional and metabolic disease: Secondary | ICD-10-CM

## 2022-01-18 DIAGNOSIS — M3500 Sicca syndrome, unspecified: Secondary | ICD-10-CM

## 2022-01-18 DIAGNOSIS — Z79899 Other long term (current) drug therapy: Secondary | ICD-10-CM

## 2022-01-18 DIAGNOSIS — Z8679 Personal history of other diseases of the circulatory system: Secondary | ICD-10-CM

## 2022-01-18 DIAGNOSIS — Z8659 Personal history of other mental and behavioral disorders: Secondary | ICD-10-CM

## 2022-01-18 DIAGNOSIS — G8929 Other chronic pain: Secondary | ICD-10-CM

## 2022-01-18 DIAGNOSIS — I10 Essential (primary) hypertension: Secondary | ICD-10-CM

## 2022-01-18 NOTE — Assessment & Plan Note (Signed)
Well controlled, no changes to meds. Encouraged heart healthy diet such as the DASH diet and exercise as tolerated.  °

## 2022-01-18 NOTE — Progress Notes (Signed)
? ?Subjective:  ? ?By signing my name below, I, Sharon Hunter, attest that this documentation has been prepared under the direction and in the presence of [name], DO  01/18/2022 ?  ? ? Patient ID: Sharon Hunter, female    DOB: 1961-02-15, 61 y.o.   MRN: [id number] ? ?Chief Complaint  ?Patient presents with  ? Blood Pressure Check  ?  Numbers have been ok at home.  ? starting to wean off zoloft  ? ? ?HPI ?Patient is in today for a follow up visit.  ? ?She complains of dry cough since her workplace lifted its mask mandate. She has a history of a lung module. She does not have a history of seasonal allergies. She is using nasonex to manage her symptoms. She is willing to try allergy medication to see if her symptoms improve.  ? ?She is currently weaning of Zoloft. She is currently on 50 mg Zoloft for the past 1.5 weeks. She reports maintaining a healthy diet and regular exercise while coming off of Zoloft. She has only taken 1 xanax while weaning off of Zoloft.  ? ?Her blood pressure is doing well during this visit. Her blood pressure is measuring within normal range at home. She continues taking 100 mg losartan daily PO, 5 mg amlodipine daily PO, 25 mg hydrochlorothiazide daily PO and reports no new issues while taking it.  ?BP Readings from Last 3 Encounters:  ?01/18/22 (!) 111/51  ?11/16/21 (!) 158/86  ?10/11/21 130/88  ? ?Pulse Readings from Last 3 Encounters:  ?01/18/22 62  ?11/16/21 (!) 58  ?10/11/21 64  ? ?She is currently on "GOLO" a weight loss program. She has lost 11 lb's since she started.  ? ? ?Past Medical History:  ?Diagnosis Date  ? Anxiety   ? Breast lump   ? stable-- gets annual mammogram  ? 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 Hypertension   ? IBS (irritable bowel syndrome) 08/01/2016  ? Migraine   ? Murmur 02/03/2011  ? Ovarian failure   ? Pulmonary nodule, left 2009  ? last CT Nov 2011-- due 08/2011  ? Rheumatoid arthritis(714.0)   ? in remission , off meds as of Summer 2012   ? ? ?Past Surgical History:  ?Procedure Laterality Date  ? CHOLECYSTECTOMY  1999  ? COLONOSCOPY  10/31/11  ? EXPLORATORY LAPAROTOMY    ? TONSILLECTOMY  1969  ? WISDOM TOOTH EXTRACTION    ? ? ?Family History  ?Problem Relation Age of Onset  ? Alcohol abuse Father   ? Hyperlipidemia Father   ? Heart disease Father   ? Alcohol abuse Brother   ? Diabetes Brother   ? Hypertension Brother   ? Arthritis Mother   ? Uterine cancer Mother   ? Hypertension Mother   ? Heart disease Sister   ?     cardiac stent  ? Diabetes Sister   ? Hyperlipidemia Sister   ? Hypertension Sister   ? Cancer Brother   ?     lung  ? Cancer Maternal Aunt   ?     lung----non smoker  ? ? ?Social History  ? ?Socioeconomic History  ? Marital status: Single  ?  Spouse name: Not on file  ? Number of children: 1  ? Years of education: 50  ? Highest education level: Not on file  ?Occupational History  ? Not on file  ?Tobacco Use  ? Smoking status: Former  ?  Types: Cigarettes  ?  Quit date: 10/03/1990  ?  Years since quitting: 31.3  ? Smokeless tobacco: Never  ?Vaping Use  ? Vaping Use: Never used  ?Substance and Sexual Activity  ? Alcohol use: Yes  ?  Alcohol/week: 3.0 standard drinks  ?  Types: 3 Standard drinks or equivalent per week  ?  Comment: Red wine 4 oz 4-5 times a week  ? Drug use: No  ? Sexual activity: Not Currently  ?  Partners: Male  ?Other Topics Concern  ? Not on file  ?Social History Narrative  ? Exercise-- jogs  ? ?Social Determinants of Health  ? ?Financial Resource Strain: Not on file  ?Food Insecurity: Not on file  ?Transportation Needs: Not on file  ?Physical Activity: Not on file  ?Stress: Not on file  ?Social Connections: Not on file  ?Intimate Partner Violence: Not on file  ? ? ?Outpatient Medications Prior to Visit  ?Medication Sig Dispense Refill  ? amLODipine (NORVASC) 5 MG tablet Take 1 tablet (5 mg total) by mouth daily. 90 tablet 0  ? cycloSPORINE, PF, (CEQUA) 0.09 % SOLN Place 1 drop into both eyes 2 (two) times daily. 60 each  2  ? diclofenac sodium (VOLTAREN) 1 % GEL APPLY 3 GRAMS TO LARGE JOINTS THREE TIMES DAILY AS NEEDED FOR PAIN 300 g 3  ? Doxylamine Succinate, Sleep, (UNISOM PO) Take 0.5 tablets by mouth at bedtime.    ? EYSUVIS 0.25 % SUSP Instill 1 drop in both eyes 4 times a day for 7 days, then 2 times a day for 7 days, then stop 8.3 mL 0  ? Golimumab 50 MG/0.5ML SOAJ INJECT 50 MG INTO THE SKIN EVERY 30 DAYS 1.5 mL 0  ? hydrochlorothiazide (HYDRODIURIL) 25 MG tablet Take 1 tablet (25 mg total) by mouth daily. 90 tablet 3  ? Ibuprofen (MOTRIN PO) Take by mouth as needed.    ? losartan (COZAAR) 100 MG tablet Take 1 tablet (100 mg total) by mouth daily. 30 tablet 2  ? MELATONIN PO Take 5-10 mg by mouth at bedtime.    ? ALPRAZolam (XANAX) 0.25 MG tablet Take 1 tablet (0.25 mg total) by mouth 2 (two) times daily as needed for anxiety. 20 tablet 0  ? sertraline (ZOLOFT) 50 MG tablet Take 1.5 tablets (75 mg total) by mouth daily. (Patient taking differently: Take 50 mg by mouth daily.) 135 tablet 3  ? mometasone (NASONEX) 50 MCG/ACT nasal spray INSTILL 2 SPRAYS INTO THE NOSE DAILY 17 g 12  ? ?No facility-administered medications prior to visit.  ? ? ?Allergies  ?Allergen Reactions  ? Other Swelling  ?  Contrast dye- head swelling per pt.  ? Contrast Media [Iodinated Contrast Media]   ? ? ?Review of Systems  ?Constitutional:  Negative for fever and malaise/fatigue.  ?HENT:  Negative for congestion.   ?Eyes:  Negative for blurred vision.  ?Respiratory:  Positive for cough (occasional dry cough). Negative for shortness of breath.   ?Cardiovascular:  Negative for chest pain, palpitations and leg swelling.  ?Gastrointestinal:  Negative for vomiting.  ?Musculoskeletal:  Negative for back pain.  ?Skin:  Negative for rash.  ?Neurological:  Negative for loss of consciousness and headaches.  ?Endo/Heme/Allergies:  Negative for environmental allergies.  ? ?   ?Objective:  ?  ?Physical Exam ?Vitals and nursing note reviewed.  ?Constitutional:   ?    General: She is not in acute distress. ?   Appearance: Normal appearance. She is not ill-appearing.  ?HENT:  ?   Head:  Normocephalic and atraumatic.  ?   Right Ear: External ear normal.  ?   Left Ear: External ear normal.  ?Eyes:  ?   Extraocular Movements: Extraocular movements intact.  ?   Pupils: Pupils are equal, round, and reactive to light.  ?Cardiovascular:  ?   Rate and Rhythm: Normal rate and regular rhythm.  ?   Heart sounds: Normal heart sounds. No murmur heard. ?  No gallop.  ?Pulmonary:  ?   Effort: Pulmonary effort is normal. No respiratory distress.  ?   Breath sounds: Normal breath sounds. No wheezing or rales.  ?Chest:  ?   Chest wall: No tenderness.  ?Skin: ?   General: Skin is warm and dry.  ?Neurological:  ?   Mental Status: She is alert and oriented to person, place, and time.  ?Psychiatric:     ?   Judgment: Judgment normal.  ? ? ?BP (!) 111/51 (BP Location: Left Arm, Cuff Size: Normal)   Pulse 62   Temp 98 ?F (36.7 ?C) (Oral)   Resp 12   Ht '5\' 7"'$  (1.702 m)   Wt 200 lb 12.8 oz (91.1 kg)   LMP 10/03/2005   SpO2 98%   BMI 31.45 kg/m?  ?Wt Readings from Last 3 Encounters:  ?01/18/22 200 lb 12.8 oz (91.1 kg)  ?10/11/21 214 lb 6.4 oz (97.3 kg)  ?08/23/21 213 lb 3.2 oz (96.7 kg)  ? ? ?Diabetic Foot Exam - Simple   ?No data filed ?  ? ?Lab Results  ?Component Value Date  ? WBC 8.0 12/06/2021  ? HGB 13.0 12/06/2021  ? HCT 38.8 12/06/2021  ? PLT 301 12/06/2021  ? GLUCOSE 93 12/06/2021  ? CHOL 258 (H) 10/29/2021  ? TRIG 100.0 10/29/2021  ? HDL 86.60 10/29/2021  ? LDLDIRECT 132.6 05/23/2013  ? LDLCALC 151 (H) 10/29/2021  ? ALT 19 12/06/2021  ? AST 14 12/06/2021  ? NA 138 12/06/2021  ? K 4.2 12/06/2021  ? CL 101 12/06/2021  ? CREATININE 0.73 12/06/2021  ? BUN 14 12/06/2021  ? CO2 28 12/06/2021  ? TSH 1.20 09/15/2020  ? ? ?Lab Results  ?Component Value Date  ? TSH 1.20 09/15/2020  ? ?Lab Results  ?Component Value Date  ? WBC 8.0 12/06/2021  ? HGB 13.0 12/06/2021  ? HCT 38.8 12/06/2021  ? MCV 91.9  12/06/2021  ? PLT 301 12/06/2021  ? ?Lab Results  ?Component Value Date  ? NA 138 12/06/2021  ? K 4.2 12/06/2021  ? CO2 28 12/06/2021  ? GLUCOSE 93 12/06/2021  ? BUN 14 12/06/2021  ? CREATININE 0.73 12/07/18

## 2022-01-18 NOTE — Patient Instructions (Signed)
Hypertension, Adult ?Hypertension is another name for high blood pressure. High blood pressure forces your heart to work harder to pump blood. This can cause problems over time. ?There are two numbers in a blood pressure reading. There is a top number (systolic) over a bottom number (diastolic). It is best to have a blood pressure that is below 120/80. ?What are the causes? ?The cause of this condition is not known. Some other conditions can lead to high blood pressure. ?What increases the risk? ?Some lifestyle factors can make you more likely to develop high blood pressure: ?Smoking. ?Not getting enough exercise or physical activity. ?Being overweight. ?Having too much fat, sugar, calories, or salt (sodium) in your diet. ?Drinking too much alcohol. ?Other risk factors include: ?Having any of these conditions: ?Heart disease. ?Diabetes. ?High cholesterol. ?Kidney disease. ?Obstructive sleep apnea. ?Having a family history of high blood pressure and high cholesterol. ?Age. The risk increases with age. ?Stress. ?What are the signs or symptoms? ?High blood pressure may not cause symptoms. Very high blood pressure (hypertensive crisis) may cause: ?Headache. ?Fast or uneven heartbeats (palpitations). ?Shortness of breath. ?Nosebleed. ?Vomiting or feeling like you may vomit (nauseous). ?Changes in how you see. ?Very bad chest pain. ?Feeling dizzy. ?Seizures. ?How is this treated? ?This condition is treated by making healthy lifestyle changes, such as: ?Eating healthy foods. ?Exercising more. ?Drinking less alcohol. ?Your doctor may prescribe medicine if lifestyle changes do not help enough and if: ?Your top number is above 130. ?Your bottom number is above 80. ?Your personal target blood pressure may vary. ?Follow these instructions at home: ?Eating and drinking ? ?If told, follow the DASH eating plan. To follow this plan: ?Fill one half of your plate at each meal with fruits and vegetables. ?Fill one fourth of your plate  at each meal with whole grains. Whole grains include whole-wheat pasta, brown rice, and whole-grain bread. ?Eat or drink low-fat dairy products, such as skim milk or low-fat yogurt. ?Fill one fourth of your plate at each meal with low-fat (lean) proteins. Low-fat proteins include fish, chicken without skin, eggs, beans, and tofu. ?Avoid fatty meat, cured and processed meat, or chicken with skin. ?Avoid pre-made or processed food. ?Limit the amount of salt in your diet to less than 1,500 mg each day. ?Do not drink alcohol if: ?Your doctor tells you not to drink. ?You are pregnant, may be pregnant, or are planning to become pregnant. ?If you drink alcohol: ?Limit how much you have to: ?0-1 drink a day for women. ?0-2 drinks a day for men. ?Know how much alcohol is in your drink. In the [place] one drink equals one 12 oz bottle of beer (355 mL), one 5 oz glass of wine (148 mL), or one 1? oz glass of hard liquor (44 mL). ?Lifestyle ? ?Work with your doctor to stay at a healthy weight or to lose weight. Ask your doctor what the best weight is for you. ?Get at least 30 minutes of exercise that causes your heart to beat faster (aerobic exercise) most days of the week. This may include walking, swimming, or biking. ?Get at least 30 minutes of exercise that strengthens your muscles (resistance exercise) at least 3 days a week. This may include lifting weights or doing Pilates. ?Do not smoke or use any products that contain nicotine or tobacco. If you need help quitting, ask your doctor. ?Check your blood pressure at home as told by your doctor. ?Keep all follow-up visits. ?Medicines ?Take over-the-counter and prescription medicines   only as told by your doctor. Follow directions carefully. ?Do not skip doses of blood pressure medicine. The medicine does not work as well if you skip doses. Skipping doses also puts you at risk for problems. ?Ask your doctor about side effects or reactions to medicines that you should watch  for. ?Contact a doctor if: ?You think you are having a reaction to the medicine you are taking. ?You have headaches that keep coming back. ?You feel dizzy. ?You have swelling in your ankles. ?You have trouble with your vision. ?Get help right away if: ?You get a very bad headache. ?You start to feel mixed up (confused). ?You feel weak or numb. ?You feel faint. ?You have very bad pain in your: ?Chest. ?Belly (abdomen). ?You vomit more than once. ?You have trouble breathing. ?These symptoms may be an emergency. Get help right away. Call 911. ?Do not wait to see if the symptoms will go away. ?Do not drive yourself to the hospital. ?Summary ?Hypertension is another name for high blood pressure. ?High blood pressure forces your heart to work harder to pump blood. ?For most people, a normal blood pressure is less than 120/80. ?Making healthy choices can help lower blood pressure. If your blood pressure does not get lower with healthy choices, you may need to take medicine. ?This information is not intended to replace advice given to you by your health care provider. Make sure you discuss any questions you have with your health care provider. ?Document Revised: 07/08/2021 Document Reviewed: 07/08/2021 ?Elsevier Patient Education ? 2023 [employer] ? ?

## 2022-01-18 NOTE — Assessment & Plan Note (Signed)
Per rheum 

## 2022-01-20 ENCOUNTER — Other Ambulatory Visit (HOSPITAL_COMMUNITY): Payer: Self-pay

## 2022-01-26 ENCOUNTER — Other Ambulatory Visit (HOSPITAL_COMMUNITY): Payer: Self-pay

## 2022-01-27 ENCOUNTER — Other Ambulatory Visit (HOSPITAL_COMMUNITY): Payer: Self-pay

## 2022-01-27 NOTE — Progress Notes (Signed)
? ?Office Visit Note ? ?Patient: Sharon Hunter             ?Date of Birth: 1960-10-29           ?MRN: [id number]             ?PCP: [name], DO ?Referring: [name], * ?Visit Date: 01/28/2022 ?Occupation: '@GUAROCC'$ @ ? ?Subjective:  ?Sharon Hunter: Sharon Hunter is a 61 y.o. female with history of seropositive rheumatoid arthritis and osteoarthritis.  Sharon Hunter is on simponi 50 mg sq injections every month. Sharon Hunter states that Sharon Hunter has been on Simponi injections every month consecutively for the past 4 months.  Sharon Hunter states that previously Sharon rheumatoid arthritis was well controlled and Sharon Hunter was able to space the dose of Simponi but has been unable to do so over the past several months.  Remains under increased stress with work as well as caring for Sharon mother.  Sharon Hunter has been experiencing Sharon flares over the past couple of months.  Sharon Hunter has been worse in both shoulders, right CMC, left midfoot, and both ankle joints intermittently.  Sharon Hunter has been taking ibuprofen and using Voltaren gel topically for symptomatic relief.  Prednisone typically causes insomnia so Sharon Hunter tries to avoid the use of oral prednisone.  Sharon Hunter had a right CMC joint injection on 09/10/2021 which provided temporary relief but Sharon Hunter have started to return.  Sharon Hunter is most severe in both shoulders currently especially the right shoulder. ?Sharon Hunter has started to change Sharon diet, work on weight loss, as well as stress management.  Sharon Hunter was also started stretching to improve Sharon range of motion.  Sharon Hunter continues to experience nocturnal Hunter at night and has fatigue during the day.  Sharon Hunter is apprehensive to change medications at this time and would like to remain on Simponi every month for now.  Sharon Hunter is due for Sharon next Simponi injection today. ?Sharon Hunter has not had any recent infections.  ? ? ? ?Activities of Daily Living:  ?Patient reports morning stiffness for 1 hour  ?Patient Reports nocturnal Hunter.   ?Difficulty dressing/grooming: Denies ?Difficulty climbing stairs: Denies ?Difficulty getting out of chair: Denies ?Difficulty using hands for taps, buttons, cutlery, and/or writing: Denies ? ?Review of Systems  ?Constitutional:  Positive for fatigue.  ?HENT:  Positive for mouth dryness. Negative for mouth sores and nose dryness.   ?Eyes:  Positive for redness and dryness. Negative for Hunter and visual disturbance.  ?Respiratory:  Positive for cough. Negative for hemoptysis, shortness of breath and difficulty breathing.   ?Cardiovascular:  Negative for chest Hunter, palpitations, hypertension and swelling in legs/feet.  ?Gastrointestinal:  Negative for blood in stool, constipation and diarrhea.  ?Endocrine: Negative for increased urination.  ?Genitourinary:  Negative for painful urination.  ?Musculoskeletal:  Positive for joint Hunter, joint Hunter, joint swelling and morning stiffness. Negative for myalgias, muscle weakness, muscle tenderness and myalgias.  ?Skin:  Negative for color change, pallor, rash, hair loss, nodules/bumps, skin tightness, ulcers and sensitivity to sunlight.  ?Allergic/Immunologic: Negative for susceptible to infections.  ?Neurological:  Negative for dizziness, numbness, headaches and weakness.  ?Hematological:  Negative for swollen glands.  ?Psychiatric/Behavioral:  Positive for depressed mood and sleep disturbance. The patient is nervous/anxious.   ? ?PMFS History:  ?Patient Active Problem List  ? Diagnosis Date Noted  ? Ear Hunter, left 08/13/2021  ? History of anxiety 03/27/2017  ? History of hypertension 03/27/2017  ? History of bundle  branch block 03/27/2017  ? Primary osteoarthritis of both hands 12/22/2016  ? History of shingles 08/02/2016  ? High risk medication use 08/02/2016  ? Hypertension 08/01/2016  ? IBS (irritable bowel syndrome) 08/01/2016  ? Anxiety 08/01/2016  ? 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 Rate-related bundle branch block 01/30/2015  ? Palpitations 01/30/2015   ? Pulmonary nodule 07/04/2011  ? Murmur 02/03/2011  ? Rheumatoid arthritis ([place]) 02/03/2011  ? Hair loss 02/03/2011  ? Fatigue 02/03/2011  ?  ?Past Medical History:  ?Diagnosis Date  ? Anxiety   ? Breast lump   ? stable-- gets annual mammogram  ? 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 Hypertension   ? IBS (irritable bowel syndrome) 08/01/2016  ? Migraine   ? Murmur 02/03/2011  ? Ovarian failure   ? Pulmonary nodule, left 2009  ? last CT Nov 2011-- due 08/2011  ? Rheumatoid arthritis(714.0)   ? in remission , off meds as of Summer 2012  ?  ?Family History  ?Problem Relation Age of Onset  ? Alcohol abuse Father   ? Hyperlipidemia Father   ? Heart disease Father   ? Alcohol abuse Brother   ? Diabetes Brother   ? Hypertension Brother   ? Arthritis Mother   ? Uterine cancer Mother   ? Hypertension Mother   ? Heart disease Sister   ?     cardiac stent  ? Diabetes Sister   ? Hyperlipidemia Sister   ? Hypertension Sister   ? Cancer Brother   ?     lung  ? Cancer Maternal Aunt   ?     lung----non smoker  ? ?Past Surgical History:  ?Procedure Laterality Date  ? CHOLECYSTECTOMY  1999  ? COLONOSCOPY  10/31/11  ? EXPLORATORY LAPAROTOMY    ? TONSILLECTOMY  1969  ? WISDOM TOOTH EXTRACTION    ? ?Social History  ? ?Social History Narrative  ? Exercise-- jogs  ? ?Immunization History  ?Administered Date(s) Administered  ? Influenza Split 07/03/2013  ? Influenza,inj,Quad PF,6-35 Mos 07/17/2020  ? Influenza-Unspecified 07/03/2014, 07/04/2015  ? PFIZER(Purple Top)SARS-COV-2 Vaccination 01/13/2020, 05/15/2020  ? Tdap 12/01/2009  ?  ? ?Objective: ?Vital Signs: BP 128/80 (BP Location: Left Arm, Patient Position: Sitting, Cuff Size: Small)   Pulse (!) 49   Resp 12   Ht '5\' 7"'$  (1.702 m)   Wt 200 lb 3.2 oz (90.8 kg)   LMP 10/03/2005   BMI 31.36 kg/m?   ? ?Physical Exam ?Vitals and nursing note reviewed.  ?Constitutional:   ?   Appearance: Sharon Hunter is well-developed.  ?HENT:  ?   Head: Normocephalic and atraumatic.  ?Eyes:  ?    Conjunctiva/sclera: Conjunctivae normal.  ?Cardiovascular:  ?   Rate and Rhythm: Normal rate and regular rhythm.  ?   Heart sounds: Normal heart sounds.  ?Pulmonary:  ?   Effort: Pulmonary effort is normal.  ?   Breath sounds: Normal breath sounds.  ?Abdominal:  ?   General: Bowel sounds are normal.  ?   Palpations: Abdomen is soft.  ?Musculoskeletal:  ?   Cervical back: Normal range of motion.  ?Skin: ?   General: Skin is warm and dry.  ?   Capillary Refill: Capillary refill takes less than 2 seconds.  ?Neurological:  ?   Mental Status: Sharon Hunter is alert and oriented to person, place, and time.  ?Psychiatric:     ?   Behavior: Behavior normal.  ?  ? ?Musculoskeletal Exam: C-spine has good  ROM.  Some band-like discomfort in the lumbar region.  No SI joint tenderness.  Painful ROM of both shoulder joints especially with internal rotation, right > left.  Tenderness over the subacromial bursa of the right shoulder.  Elbow joints, wrist joints, MCPs, PIPs, and DIPs good ROM with no synovitis.  Tenderness and thickening of the right CMC joint.  Complete fist formation bilaterally.  Hip joints have good range of motion with no groin Hunter.  Knee joints have good range of motion with no warmth or effusion.  Some fullness of the left ankle was noted along with some fullness on the dorsal aspect of Sharon left foot.  Tenderness over the right third and fourth MTP joints. ? ?CDAI Exam: ?CDAI Score: 3.2  ?Patient Global: 6 mm; Provider Global: 6 mm ?Swollen: 0 ; Tender: 6  ?Joint Exam 01/28/2022  ? ?   Right  Left  ?Acromioclavicular   Tender     ?Glenohumeral   Tender   Tender  ?[place]   Tender     ?Ankle      Tender  ?Tarsometatarsal      Tender  ? ? ? ?Investigation: ?No additional findings. ? ?Imaging: ?No results found. ? ?Recent Labs: ?Lab Results  ?Component Value Date  ? WBC 8.0 12/06/2021  ? HGB 13.0 12/06/2021  ? PLT 301 12/06/2021  ? NA 138 12/06/2021  ? K 4.2 12/06/2021  ? CL 101 12/06/2021  ? CO2 28 12/06/2021  ? GLUCOSE 93  12/06/2021  ? BUN 14 12/06/2021  ? CREATININE 0.73 12/06/2021  ? BILITOT 0.6 12/06/2021  ? ALKPHOS 58 03/27/2017  ? AST 14 12/06/2021  ? ALT 19 12/06/2021  ? PROT 7.0 12/06/2021  ? ALBUMIN 4.3 03/27/2017  ? CALCIUM 10.0 03/0

## 2022-01-28 ENCOUNTER — Encounter: Payer: Self-pay | Admitting: Physician Assistant

## 2022-01-28 ENCOUNTER — Other Ambulatory Visit (HOSPITAL_COMMUNITY): Payer: Self-pay

## 2022-01-28 ENCOUNTER — Other Ambulatory Visit: Payer: Self-pay | Admitting: *Deleted

## 2022-01-28 ENCOUNTER — Ambulatory Visit: Payer: 59 | Admitting: Physician Assistant

## 2022-01-28 VITALS — BP 128/80 | HR 49 | Resp 12 | Ht 67.0 in | Wt 200.2 lb

## 2022-01-28 DIAGNOSIS — M19042 Primary osteoarthritis, left hand: Secondary | ICD-10-CM

## 2022-01-28 DIAGNOSIS — R5383 Other fatigue: Secondary | ICD-10-CM | POA: Diagnosis not present

## 2022-01-28 DIAGNOSIS — Z8659 Personal history of other mental and behavioral disorders: Secondary | ICD-10-CM

## 2022-01-28 DIAGNOSIS — M19041 Primary osteoarthritis, right hand: Secondary | ICD-10-CM | POA: Diagnosis not present

## 2022-01-28 DIAGNOSIS — Z8679 Personal history of other diseases of the circulatory system: Secondary | ICD-10-CM

## 2022-01-28 DIAGNOSIS — Z79899 Other long term (current) drug therapy: Secondary | ICD-10-CM

## 2022-01-28 DIAGNOSIS — M0579 Rheumatoid arthritis with rheumatoid factor of multiple sites without organ or systems involvement: Secondary | ICD-10-CM

## 2022-01-28 DIAGNOSIS — M3500 Sicca syndrome, unspecified: Secondary | ICD-10-CM

## 2022-01-28 DIAGNOSIS — M25512 Pain in left shoulder: Secondary | ICD-10-CM

## 2022-01-28 DIAGNOSIS — Z8639 Personal history of other endocrine, nutritional and metabolic disease: Secondary | ICD-10-CM | POA: Diagnosis not present

## 2022-01-28 DIAGNOSIS — Z8619 Personal history of other infectious and parasitic diseases: Secondary | ICD-10-CM

## 2022-01-28 DIAGNOSIS — G8929 Other chronic pain: Secondary | ICD-10-CM

## 2022-01-28 DIAGNOSIS — R911 Solitary pulmonary nodule: Secondary | ICD-10-CM

## 2022-01-28 DIAGNOSIS — M25511 Pain in right shoulder: Secondary | ICD-10-CM

## 2022-01-28 MED ORDER — DICLOFENAC SODIUM 1 % EX GEL
CUTANEOUS | 2 refills | Status: DC
  Filled 2022-01-28: qty 400, 25d supply, fill #0

## 2022-01-28 NOTE — Patient Instructions (Signed)
Standing Labs ?We placed an order today for your standing lab work.  ? ?Please have your standing labs drawn in June and every 3 months  ? ?If possible, please have your labs drawn 2 weeks prior to your appointment so that the provider can discuss your results at your appointment. ? ?Please note that you may see your imaging and lab results in MyChart before we have reviewed them. ?We may be awaiting multiple results to interpret others before contacting you. ?Please allow our office up to 72 hours to thoroughly review all of the results before contacting the office for clarification of your results. ? ?We have open lab daily: ?Monday through Thursday from 1:30-4:30 PM and Friday from 1:30-4:00 PM ?at the office of Dr. Shaili Deveshwar, [facility].   ?Please be advised, all patients with office appointments requiring lab work will take precedent over walk-in lab work.  ?If possible, please come for your lab work on Monday and Friday afternoons, as you may experience shorter wait times. ?The office is located at [address], [address], [place], [place] [address] ?No appointment is necessary.   ?Labs are drawn by [facility]. Please bring your co-pay at the time of your lab draw.  You may receive a bill from [facility] for your lab work. ? ?Please note if you are on Hydroxychloroquine and and an order has been placed for a Hydroxychloroquine level, you will need to have it drawn 4 hours or more after your last dose. ? ?If you wish to have your labs drawn at another location, please call the office 24 hours in advance to send orders. ? ?If you have any questions regarding directions or hours of operation,  ?please call [phone].   ?As a reminder, please drink plenty of water prior to coming for your lab work. Thanks! ? ?

## 2022-02-04 ENCOUNTER — Other Ambulatory Visit (HOSPITAL_COMMUNITY): Payer: Self-pay

## 2022-02-09 ENCOUNTER — Other Ambulatory Visit (HOSPITAL_COMMUNITY): Payer: Self-pay

## 2022-02-09 ENCOUNTER — Other Ambulatory Visit: Payer: Self-pay | Admitting: Family Medicine

## 2022-02-09 DIAGNOSIS — Z0289 Encounter for other administrative examinations: Secondary | ICD-10-CM

## 2022-02-09 MED ORDER — MOMETASONE FUROATE 50 MCG/ACT NA SUSP
2.0000 | Freq: Every day | NASAL | 12 refills | Status: DC
Start: 2022-02-09 — End: 2022-05-26
  Filled 2022-02-09 – 2022-03-04 (×4): qty 17, 30d supply, fill #0

## 2022-02-14 ENCOUNTER — Ambulatory Visit (INDEPENDENT_AMBULATORY_CARE_PROVIDER_SITE_OTHER): Payer: 59 | Admitting: Rheumatology

## 2022-02-14 ENCOUNTER — Other Ambulatory Visit (HOSPITAL_COMMUNITY): Payer: Self-pay

## 2022-02-14 VITALS — BP 124/74 | HR 57

## 2022-02-14 DIAGNOSIS — G8929 Other chronic pain: Secondary | ICD-10-CM

## 2022-02-14 DIAGNOSIS — M25511 Pain in right shoulder: Secondary | ICD-10-CM | POA: Diagnosis not present

## 2022-02-14 MED ORDER — TRIAMCINOLONE ACETONIDE 40 MG/ML IJ SUSP
40.0000 mg | INTRAMUSCULAR | Status: AC | PRN
  Administered 2022-02-14: 40 mg via INTRA_ARTICULAR

## 2022-02-14 MED ORDER — LIDOCAINE HCL 1 % IJ SOLN
1.5000 mL | INTRAMUSCULAR | Status: AC | PRN
  Administered 2022-02-14: 1.5 mL

## 2022-02-14 NOTE — Progress Notes (Signed)
? ?  Procedure Note ? ?Patient: Sharon Hunter             ?Date of Birth: 06-01-1961           ?MRN: [id number]             ?Visit Date: 02/14/2022 ? ?Procedures: ?Visit Diagnoses:  ?1. Chronic right shoulder pain   ?Patient has been experiencing right shoulder joint pain.  She was scheduled today to have right subacromial bursa injection. ? ?Large Joint Inj: R subacromial bursa on 02/14/2022 11:47 AM ?Indications: pain ?Details: 27 G 1.5 in needle, lateral approach ? ?Arthrogram: No ? ?Medications: 1.5 mL lidocaine 1 %; 40 mg triamcinolone acetonide 40 MG/ML ?Aspirate: 0 mL ?Outcome: tolerated well, no immediate complications ?Procedure, treatment alternatives, risks and benefits explained, specific risks discussed. Consent was given by the patient. Immediately prior to procedure a time out was called to verify the correct patient, procedure, equipment, support staff and site/side marked as required. Patient was prepped and draped in the usual sterile fashion.  ? ?Postprocedure instructions were given. ?Bo Merino, MD  ? ? ?

## 2022-02-22 ENCOUNTER — Encounter (INDEPENDENT_AMBULATORY_CARE_PROVIDER_SITE_OTHER): Payer: Self-pay | Admitting: Family Medicine

## 2022-02-22 ENCOUNTER — Ambulatory Visit (INDEPENDENT_AMBULATORY_CARE_PROVIDER_SITE_OTHER): Payer: 59 | Admitting: Family Medicine

## 2022-02-22 VITALS — BP 112/70 | HR 50 | Temp 97.5°F | Ht 66.0 in | Wt 191.0 lb

## 2022-02-22 DIAGNOSIS — E668 Other obesity: Secondary | ICD-10-CM | POA: Diagnosis not present

## 2022-02-22 DIAGNOSIS — Z6831 Body mass index (BMI) 31.0-31.9, adult: Secondary | ICD-10-CM

## 2022-02-22 DIAGNOSIS — E559 Vitamin D deficiency, unspecified: Secondary | ICD-10-CM

## 2022-02-22 DIAGNOSIS — K589 Irritable bowel syndrome without diarrhea: Secondary | ICD-10-CM | POA: Diagnosis not present

## 2022-02-22 DIAGNOSIS — E669 Obesity, unspecified: Secondary | ICD-10-CM

## 2022-02-22 DIAGNOSIS — Z1331 Encounter for screening for depression: Secondary | ICD-10-CM

## 2022-02-22 DIAGNOSIS — E785 Hyperlipidemia, unspecified: Secondary | ICD-10-CM | POA: Diagnosis not present

## 2022-02-22 DIAGNOSIS — E7849 Other hyperlipidemia: Secondary | ICD-10-CM

## 2022-02-22 DIAGNOSIS — R5383 Other fatigue: Secondary | ICD-10-CM

## 2022-02-22 DIAGNOSIS — I10 Essential (primary) hypertension: Secondary | ICD-10-CM | POA: Diagnosis not present

## 2022-02-22 DIAGNOSIS — R0602 Shortness of breath: Secondary | ICD-10-CM | POA: Diagnosis not present

## 2022-02-22 DIAGNOSIS — F411 Generalized anxiety disorder: Secondary | ICD-10-CM

## 2022-02-23 ENCOUNTER — Other Ambulatory Visit (HOSPITAL_COMMUNITY): Payer: Self-pay

## 2022-02-23 LAB — COMPREHENSIVE METABOLIC PANEL
ALT: 25 IU/L (ref 0–32)
AST: 11 IU/L (ref 0–40)
Albumin/Globulin Ratio: 2 (ref 1.2–2.2)
Albumin: 4.7 g/dL (ref 3.8–4.9)
Alkaline Phosphatase: 81 IU/L (ref 44–121)
BUN/Creatinine Ratio: 19 (ref 12–28)
BUN: 14 mg/dL (ref 8–27)
Bilirubin Total: 0.6 mg/dL (ref 0.0–1.2)
CO2: 26 mmol/L (ref 20–29)
Calcium: 9.6 mg/dL (ref 8.7–10.3)
Chloride: 101 mmol/L (ref 96–106)
Creatinine, Ser: 0.74 mg/dL (ref 0.57–1.00)
Globulin, Total: 2.4 g/dL (ref 1.5–4.5)
Glucose: 110 mg/dL — ABNORMAL HIGH (ref 70–99)
Potassium: 3.9 mmol/L (ref 3.5–5.2)
Sodium: 139 mmol/L (ref 134–144)
Total Protein: 7.1 g/dL (ref 6.0–8.5)
eGFR: 93 mL/min/{1.73_m2} (ref >59)

## 2022-02-23 LAB — HEMOGLOBIN A1C
Est. average glucose Bld gHb Est-mCnc: 117 mg/dL
Hgb A1c MFr Bld: 5.7 % — ABNORMAL HIGH (ref 4.8–5.6)

## 2022-02-23 LAB — LIPID PANEL
Chol/HDL Ratio: 2.6 ratio (ref 0.0–4.4)
Cholesterol, Total: 261 mg/dL — ABNORMAL HIGH (ref 100–199)
HDL: 99 mg/dL (ref >39)
LDL Chol Calc (NIH): 145 mg/dL — ABNORMAL HIGH (ref 0–99)
Triglycerides: 104 mg/dL (ref 0–149)
VLDL Cholesterol Cal: 17 mg/dL (ref 5–40)

## 2022-02-23 LAB — CBC WITH DIFFERENTIAL/PLATELET
Basophils Absolute: 0 10*3/uL (ref 0.0–0.2)
Basos: 0 %
EOS (ABSOLUTE): 0.1 10*3/uL (ref 0.0–0.4)
Eos: 2 %
Hematocrit: 42.3 % (ref 34.0–46.6)
Hemoglobin: 13.8 g/dL (ref 11.1–15.9)
Immature Grans (Abs): 0 10*3/uL (ref 0.0–0.1)
Immature Granulocytes: 0 %
Lymphocytes Absolute: 2.4 10*3/uL (ref 0.7–3.1)
Lymphs: 31 %
MCH: 30.5 pg (ref 26.6–33.0)
MCHC: 32.6 g/dL (ref 31.5–35.7)
MCV: 93 fL (ref 79–97)
Monocytes Absolute: 0.7 10*3/uL (ref 0.1–0.9)
Monocytes: 9 %
Neutrophils Absolute: 4.4 10*3/uL (ref 1.4–7.0)
Neutrophils: 58 %
Platelets: 289 10*3/uL (ref 150–450)
RBC: 4.53 x10E6/uL (ref 3.77–5.28)
RDW: 12.5 % (ref 11.7–15.4)
WBC: 7.5 10*3/uL (ref 3.4–10.8)

## 2022-02-23 LAB — VITAMIN D 25 HYDROXY (VIT D DEFICIENCY, FRACTURES): Vit D, 25-Hydroxy: 31 ng/mL (ref 30.0–100.0)

## 2022-02-23 LAB — VITAMIN B12: Vitamin B-12: 252 pg/mL (ref 232–1245)

## 2022-02-23 LAB — INSULIN, RANDOM: INSULIN: 11.3 u[IU]/mL (ref 2.6–24.9)

## 2022-02-23 LAB — T4, FREE: Free T4: 1.13 ng/dL (ref 0.82–1.77)

## 2022-02-23 LAB — TSH: TSH: 0.753 u[IU]/mL (ref 0.450–4.500)

## 2022-02-24 ENCOUNTER — Other Ambulatory Visit (HOSPITAL_COMMUNITY): Payer: Self-pay

## 2022-02-25 ENCOUNTER — Other Ambulatory Visit (HOSPITAL_COMMUNITY): Payer: Self-pay

## 2022-03-04 ENCOUNTER — Other Ambulatory Visit: Payer: Self-pay | Admitting: Medical

## 2022-03-04 ENCOUNTER — Other Ambulatory Visit (HOSPITAL_COMMUNITY): Payer: Self-pay

## 2022-03-04 ENCOUNTER — Other Ambulatory Visit: Payer: Self-pay

## 2022-03-04 ENCOUNTER — Encounter: Payer: Self-pay | Admitting: Family Medicine

## 2022-03-04 MED ORDER — AMLODIPINE BESYLATE 5 MG PO TABS
5.0000 mg | ORAL_TABLET | Freq: Every day | ORAL | 0 refills | Status: DC
  Filled 2022-03-04: qty 90, 90d supply, fill #0

## 2022-03-04 MED ORDER — AZELASTINE HCL 0.1 % NA SOLN
1.0000 | Freq: Two times a day (BID) | NASAL | 2 refills | Status: DC
  Filled 2022-03-04: qty 30, 50d supply, fill #0

## 2022-03-08 ENCOUNTER — Ambulatory Visit (INDEPENDENT_AMBULATORY_CARE_PROVIDER_SITE_OTHER): Payer: 59 | Admitting: Family Medicine

## 2022-03-08 ENCOUNTER — Encounter (INDEPENDENT_AMBULATORY_CARE_PROVIDER_SITE_OTHER): Payer: Self-pay | Admitting: Family Medicine

## 2022-03-08 VITALS — BP 132/84 | HR 62 | Temp 97.7°F | Ht 66.0 in | Wt 191.0 lb

## 2022-03-08 DIAGNOSIS — E7849 Other hyperlipidemia: Secondary | ICD-10-CM | POA: Diagnosis not present

## 2022-03-08 DIAGNOSIS — E538 Deficiency of other specified B group vitamins: Secondary | ICD-10-CM

## 2022-03-08 DIAGNOSIS — E559 Vitamin D deficiency, unspecified: Secondary | ICD-10-CM | POA: Diagnosis not present

## 2022-03-08 DIAGNOSIS — E669 Obesity, unspecified: Secondary | ICD-10-CM

## 2022-03-08 DIAGNOSIS — Z6830 Body mass index (BMI) 30.0-30.9, adult: Secondary | ICD-10-CM

## 2022-03-08 DIAGNOSIS — I10 Essential (primary) hypertension: Secondary | ICD-10-CM | POA: Diagnosis not present

## 2022-03-08 DIAGNOSIS — R7303 Prediabetes: Secondary | ICD-10-CM

## 2022-03-08 NOTE — Patient Instructions (Signed)
The 10-year ASCVD risk score (Arnett DK, et al., 2019) is: 3.9%   Values used to calculate the score:     Age: 61 years     Sex: Female     Is Non-Hispanic African American: No     Diabetic: No     Tobacco smoker: No     Systolic Blood Pressure: 122 mmHg     Is BP treated: Yes     HDL Cholesterol: 99 mg/dL     Total Cholesterol: 261 mg/dL

## 2022-03-08 NOTE — Progress Notes (Signed)
Chief Complaint:   OBESITY Sharon Hunter (MR# [id number]) is a 61 y.o. female who presents for evaluation and treatment of obesity and related comorbidities. Current BMI is Body mass index is 30.83 kg/m. Sharon Hunter has been struggling with her weight for many years and has been unsuccessful in either losing weight, maintaining weight loss, or reaching her healthy weight goal.  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 She lives alone and is divorced. She has tried several diets in the past. Worst habit is ETOH intake 3 days per week on an average, with several drinks of wine, bourbon, or saki. She had been doing Go-Lo for several months. Prior had skipped meals.    Sharon Hunter is currently in the action stage of change and ready to dedicate time achieving and maintaining a healthier weight. Sharon Hunter is interested in becoming our patient and working on intensive lifestyle modifications including (but not limited to) diet and exercise for weight loss.  Sharon Hunter's habits were reviewed today and are as follows: Her family eats meals together, she thinks her family will eat healthier with her, she struggles with family and or coworkers weight loss sabotage, her desired weight loss is 26-31 lbs, she has been heavy most of her life, she started gaining weight between [age] years old, her heaviest weight ever was 216 pounds, she has significant food cravings issues, she snacks frequently in the evenings, she skips meals frequently, she is frequently drinking liquids with calories, she frequently eats larger portions than normal, she has binge eating behaviors, and she struggles with emotional eating.  Depression Screen Sharon Hunter's Food and Mood (modified PHQ-9) score was 5.     02/22/2022    7:52 AM  Depression screen PHQ 2/9  Decreased Interest 2  Down, Depressed, Hopeless 0  PHQ - 2 Score 2  Altered sleeping 0  Tired, decreased energy 1  Change in appetite 1  Feeling bad or failure  about yourself  1  Trouble concentrating 0  Moving slowly or fidgety/restless 0  Suicidal thoughts 0  PHQ-9 Score 5  Difficult doing work/chores Not difficult at all   Subjective:   1. Other fatigue Sharon Hunter admits to daytime somnolence and admits to waking up still tired. Patient has a history of symptoms of daytime fatigue and morning fatigue. Sharon Hunter generally gets 6 or 9 hours of sleep per night, and states that she has difficulty falling asleep. Snoring is present. Apneic episodes are not present. Epworth Sleepiness Score is 2.   2. SOB (shortness of breath) on exertion Sharon Hunter notes increasing shortness of breath with exercising and seems to be worsening over time with weight gain. She notes getting out of breath sooner with activity than she used to. This has not gotten worse recently. Sharon Hunter denies shortness of breath at rest or orthopnea.  3. Essential hypertension Sharon Hunter is taking losartan, Norvasc, and hydrochlorothiazide.   4. GAD (generalized anxiety disorder) Sharon Hunter is taking Zoloft. She has been on medications for 1.5-2 years now for anxiety. She denies any depressive symptoms, and she denies that emotional eating is a concern.   5. Vitamin D deficiency Sharon Hunter was recently on OTC Vitamin D chewable Vitamin D. She recently stopped.   6. Irritable bowel syndrome, unspecified type Sharon Hunter is not on medications, and she notes lactose intolerance.   7. Other hyperlipidemia Sharon Hunter is not on medications. Her LDL has been elevated in the past.   Assessment/Plan:   Orders Placed  This Encounter  Procedures   CBC with Differential/Platelet   Comprehensive metabolic panel   Hemoglobin A1c   Insulin, random   Lipid panel   TSH   T4, free   VITAMIN D 25 Hydroxy (Vit-D Deficiency, Fractures)   Vitamin B12   EKG 12-Lead    There are no discontinued medications.   No orders of the defined types were placed in this encounter.    1. Other fatigue Sharon Hunter does not feel that her weight is causing  her energy to be lower than it should be. Fatigue may be related to obesity, depression or many other causes. Labs will be ordered, and in the meanwhile, Sharon Hunter will focus on self care including making healthy food choices, increasing physical activity and focusing on stress reduction.  - EKG 12-Lead - CBC with Differential/Platelet - Hemoglobin A1c - Insulin, random - TSH - T4, free - Vitamin B12  2. SOB (shortness of breath) on exertion Sharon Hunter does not feel that she gets out of breath more easily that she used to when she exercises. Sharon Hunter's shortness of breath appears to be obesity related and exercise induced. She has agreed to work on weight loss and gradually increase exercise to treat her exercise induced shortness of breath. Will continue to monitor closely.  3. Essential hypertension Sharon Hunter's blood pressure is at goal today. She will continue her medications per her PCP. Sharon Hunter will continue working on healthy weight loss and exercise to improve blood pressure control. We will watch for signs of hypotension as she continues her lifestyle modifications.  4. GAD (generalized anxiety disorder) Sharon Hunter will continue her medications per her PCP.   5. Vitamin D deficiency Low Vitamin D level contributes to fatigue and are associated with obesity, breast, and colon cancer. We will check labs today. Sharon Hunter will follow-up for routine testing of Vitamin D, at least 2-3 times per year to avoid over-replacement.  - VITAMIN D 25 Hydroxy (Vit-D Deficiency, Fractures)  6. Irritable bowel syndrome, unspecified type I reviewed with the patient lactose free cheeses, yogurts, etc. She has no concerns regarding her ability to follow the meal plan.   7. Other hyperlipidemia We will check labs today. Cardiovascular risk and specific lipid/LDL goals reviewed.  We discussed several lifestyle modifications today. Sharon Hunter will work on her prudent nutritional plan with low sat and trans fats. Orders and follow up as  documented in patient record.   Counseling Intensive lifestyle modifications are the first line treatment for this issue. Dietary changes: Increase soluble fiber. Decrease simple carbohydrates. Exercise changes: Moderate to vigorous-intensity aerobic activity 150 minutes per week if tolerated. Lipid-lowering medications: see documented in medical record.  - Comprehensive metabolic panel - Lipid panel  8. Depression screening Sharon Hunter had a positive depression screening. Depression is commonly associated with obesity and often results in emotional eating behaviors. We will monitor this closely and work on CBT to help improve the non-hunger eating patterns. Referral to Psychology may be required if no improvement is seen as she continues in our clinic.  9. Class 1 obesity with serious comorbidity and body mass index (BMI) of 31.0 to 31.9 in adult, unspecified obesity type Sharon Hunter is currently in the action stage of change and her goal is to continue with weight loss efforts. I recommend Sharon Hunter begin the structured treatment plan as follows:  She has agreed to the Category 1 Plan with lunch options.  Exercise goals: As is.    Behavioral modification strategies: increasing lean protein intake, decreasing liquid calories, decreasing  alcohol intake, and meal planning and cooking strategies.  She was informed of the importance of frequent follow-up visits to maximize her success with intensive lifestyle modifications for her multiple health conditions. She was informed we would discuss her lab results at her next visit unless there is a critical issue that needs to be addressed sooner. Sharon Hunter agreed to keep her next visit at the agreed upon time to discuss these results.  Objective:   Blood pressure 112/70, pulse (!) 50, temperature (!) 97.5 F (36.4 C), height '5\' 6"'$  (1.676 m), weight 191 lb (86.6 kg), last menstrual period 10/03/2005, SpO2 98 %. Body mass index is 30.83 kg/m.  EKG: Normal sinus rhythm,  rate 56 BPM.  Indirect Calorimeter completed today shows a VO2 of 179 and a REE of 1238.  Her calculated basal metabolic rate is 2122 thus her basal metabolic rate is worse than expected.  General: Cooperative, alert, well developed, in no acute distress. HEENT: Conjunctivae and lids unremarkable. Cardiovascular: Regular rhythm.  Lungs: Normal work of breathing. Neurologic: No focal deficits.   Lab Results  Component Value Date   CREATININE 0.74 02/22/2022   BUN 14 02/22/2022   NA 139 02/22/2022   K 3.9 02/22/2022   CL 101 02/22/2022   CO2 26 02/22/2022   Lab Results  Component Value Date   ALT 25 02/22/2022   AST 11 02/22/2022   ALKPHOS 81 02/22/2022   BILITOT 0.6 02/22/2022   Lab Results  Component Value Date   HGBA1C 5.7 (H) 02/22/2022   Lab Results  Component Value Date   INSULIN 11.3 02/22/2022   Lab Results  Component Value Date   TSH 0.753 02/22/2022   Lab Results  Component Value Date   CHOL 261 (H) 02/22/2022   HDL 99 02/22/2022   LDLCALC 145 (H) 02/22/2022   LDLDIRECT 132.6 05/23/2013   TRIG 104 02/22/2022   CHOLHDL 2.6 02/22/2022   Lab Results  Component Value Date   WBC 7.5 02/22/2022   HGB 13.8 02/22/2022   HCT 42.3 02/22/2022   MCV 93 02/22/2022   PLT 289 02/22/2022   No results found for: IRON, TIBC, FERRITIN  Attestation Statements:   Reviewed by clinician on day of visit: allergies, medications, problem list, medical history, surgical history, family history, social history, and previous encounter notes.  Time spent on visit including pre-visit chart review and post-visit charting and care was 45 minutes.   Wilhemena Durie, am acting as transcriptionist for [employer], DO.  I have reviewed the above documentation for accuracy and completeness, and I agree with the above. Marjory Sneddon, D.O.  The [place] was signed into law in 2016 which includes the topic of electronic health records.  This provides  immediate access to information in MyChart.  This includes consultation notes, operative notes, office notes, lab results and pathology reports.  If you have any questions about what you read please let us know at your next visit so we can discuss your concerns and take corrective action if need be.  We are right here with you.

## 2022-03-09 DIAGNOSIS — R7303 Prediabetes: Secondary | ICD-10-CM | POA: Insufficient documentation

## 2022-03-09 DIAGNOSIS — E538 Deficiency of other specified B group vitamins: Secondary | ICD-10-CM | POA: Insufficient documentation

## 2022-03-14 ENCOUNTER — Other Ambulatory Visit: Payer: Self-pay | Admitting: Internal Medicine

## 2022-03-14 ENCOUNTER — Other Ambulatory Visit (HOSPITAL_COMMUNITY): Payer: Self-pay

## 2022-03-14 ENCOUNTER — Other Ambulatory Visit: Payer: Self-pay | Admitting: Physician Assistant

## 2022-03-14 ENCOUNTER — Other Ambulatory Visit: Payer: Self-pay | Admitting: Pharmacist

## 2022-03-14 MED ORDER — GOLIMUMAB 50 MG/0.5ML ~~LOC~~ SOAJ
SUBCUTANEOUS | 0 refills | Status: DC
  Filled 2022-03-14: qty 0.5, 30d supply, fill #0
  Filled 2022-04-13: qty 0.5, 30d supply, fill #1

## 2022-03-14 MED ORDER — GOLIMUMAB 50 MG/0.5ML ~~LOC~~ SOAJ
SUBCUTANEOUS | 0 refills | Status: DC
  Filled ????-??-??: qty 1.5, 84d supply, fill #0

## 2022-03-14 NOTE — Telephone Encounter (Signed)
Next Visit: 05/02/2022  Last Visit: 01/28/2022  Last Fill: 12/27/2021  DX: Rheumatoid arthritis involving multiple sites with positive rheumatoid factor   Current Dose per office note 01/28/2022: Simponi 50 mg sq injections every 28 days  Labs: 02/22/2022 Glucose 110, rest of CMP and CBC WNL  TB Gold: 07/27/2021 Neg    Okay to refill Simponi?

## 2022-03-15 NOTE — Progress Notes (Signed)
Chief Complaint:   OBESITY Sharon Hunter is here to discuss her progress with her obesity treatment plan along with follow-up of her obesity related diagnoses. Sharon Hunter is on the Category 1 Plan and GOLO and states she is following her eating plan approximately 75% of the time. Sharon Hunter states she is walking 30-45 minutes 3-4 times per week.  Today's visit was #: 2 Starting weight: 191 lbs Starting date: 02/22/2022 Today's weight: 191 lbs Today's date: 03/08/2022 Total lbs lost to date: 0 Total lbs lost since last in-office visit: 0  Interim History: Sharon Hunter is here today for her first follow-up office visit since starting the program with [place].  All blood work/ lab tests that were recently ordered by myself or an outside provider were reviewed with patient today per their request.   Extended time was spent counseling her on all new disease processes that were discovered or preexisting ones that are affected by BMI.  she understands that many of these abnormalities will need to monitored regularly along with the current treatment plan of prudent dietary changes, in which we are making each and every office visit, to improve these health parameters. Sharon Hunter got sick at work with bronchitis and could not follow meal plan for about 5-7 days.  She denies any hunger or cravings. "I like it and it is easy to follow".  She denies any issues with plan.  Sharon Hunter was at the beach this past weekend for 4 days and drank alcohol and ate off plan.     Subjective:   1. Prediabetes New diagnosis, discussed labs with patient today. Sharon Hunter's parents and siblings have diabetes. She has never had a history of prior prediabetes.  2. Essential hypertension Discussed labs with patient today. ECG, poor tracing quality, sinus brady, no acute findings and Sharon Hunter is asymptomatic. She is on Norvasc, HCTZ, and losartan.    3. Vitamin B12 deficiency New diagnosis, discussed labs with patient today. B12 was 255m CBC, within normal  limits, no anemia.  4. Vitamin D deficiency New diagnosis, discussed labs with patient today. Sharon Hunter a history of osteoporosis on bone density done on 03/22.  Reviewed with Sharon Hunter with T-score 2.6, no current treatment.    5. Other hyperlipidemia Discussed labs with patient today. The 10-year ASCVD risk score (Arnett DK, et al., 2019) is: 3.9%   Values used to calculate the score:     Age: [age]years     Sex: Female     Is Non-Hispanic African American: No     Diabetic: No     Tobacco smoker: No     Systolic Blood Pressure: 1818mmHg     Is BP treated: Yes     HDL Cholesterol: 99 mg/dL     Total Cholesterol: 261 mg/dL   Assessment/Plan:  No orders of the defined types were placed in this encounter.   Medications Discontinued During This Encounter  Medication Reason   diclofenac sodium (VOLTAREN) 1 % GEL      No orders of the defined types were placed in this encounter.    1. Prediabetes Sharon Hunter's A1c was 5.7 and fasting insulin was 11.3, both above goal.  Extensive counseling done and handouts given for prediabetes, and insulin resistance to Sharon Hunter  Continue prudent nutritional plan and weight loss.  Recheck labs in 3 months.  2. Essential hypertension CMP, CBC, are within normal limits.  Sharon Hunter blood pressure was in high normal range her in office today.  Continue medications per  PCP, continue prudent nutritional plan, continue with exercise, weight loss and decrease salt intake. No need for further work up regarding ECG.   3. Vitamin B12 deficiency Start OTC B12, 300 mcg-500 mcg daily.  Counseling done of B12 deficiency.  Recheck labs in 3 months or so.   4. Vitamin D deficiency Sharon Hunter will start OTC gummy as she cannot take pills or tablets of any kind.  I recommended 2,000 IU daily.  Vitamin D daily and 800 calcium+supplement daily.   5. Other hyperlipidemia No needs for medications today.  Continue prudent nutritional plan, decrease saturated and trans fats.  Further  management per PCP.  Continue exercising and lifestyle modifications.   6. Obesity, Current BMI 30.8 Sharon Hunter will continue to follow GOLO principles, but eat all foods on the plan.   Sharon Hunter is currently in the action stage of change. As such, her goal is to continue with weight loss efforts. She has agreed to the Category 1 Plan.   Exercise goals:  As is.   Behavioral modification strategies: increasing lean protein intake, decreasing simple carbohydrates, and travel eating strategies.  Sharon Hunter has agreed to follow-up with our clinic in 2 weeks. She was informed of the importance of frequent follow-up visits to maximize her success with intensive lifestyle modifications for her multiple health conditions.   Objective:   Blood pressure 132/84, pulse 62, temperature 97.7 F (36.5 C), height '5\' 6"'$  (1.676 m), weight 191 lb (86.6 kg), last menstrual period 10/03/2005, SpO2 99 %. Body mass index is 30.83 kg/m.  General: Cooperative, alert, well developed, in no acute distress. HEENT: Conjunctivae and lids unremarkable. Cardiovascular: Regular rhythm.  Lungs: Normal work of breathing. Neurologic: No focal deficits.   Lab Results  Component Value Date   CREATININE 0.74 02/22/2022   BUN 14 02/22/2022   NA 139 02/22/2022   K 3.9 02/22/2022   CL 101 02/22/2022   CO2 26 02/22/2022   Lab Results  Component Value Date   ALT 25 02/22/2022   AST 11 02/22/2022   ALKPHOS 81 02/22/2022   BILITOT 0.6 02/22/2022   Lab Results  Component Value Date   HGBA1C 5.7 (H) 02/22/2022   Lab Results  Component Value Date   INSULIN 11.3 02/22/2022   Lab Results  Component Value Date   TSH 0.753 02/22/2022   Lab Results  Component Value Date   CHOL 261 (H) 02/22/2022   HDL 99 02/22/2022   LDLCALC 145 (H) 02/22/2022   LDLDIRECT 132.6 05/23/2013   TRIG 104 02/22/2022   CHOLHDL 2.6 02/22/2022   Lab Results  Component Value Date   VD25OH 31.0 02/22/2022   VD25OH 32 01/30/2018   Lab Results   Component Value Date   WBC 7.5 02/22/2022   HGB 13.8 02/22/2022   HCT 42.3 02/22/2022   MCV 93 02/22/2022   PLT 289 02/22/2022   No results found for: "IRON", "TIBC", "FERRITIN"  Attestation Statements:   Reviewed by clinician on day of visit: allergies, medications, problem list, medical history, surgical history, family history, social history, and previous encounter notes.  Time spent on visit including pre-visit chart review and post-visit care and charting was 45 minutes.   I, Davy Pique, RMA, am acting as Location manager for [employer], DO.  I have reviewed the above documentation for accuracy and completeness, and I agree with the above. Marjory Sneddon, D.O.  The [place] was signed into law in 2016 which includes the topic of electronic health records.  This provides immediate access to information in MyChart.  This includes consultation notes, operative notes, office notes, lab results and pathology reports.  If you have any questions about what you read please let us know at your next visit so we can discuss your concerns and take corrective action if need be.  We are right here with you.

## 2022-03-17 ENCOUNTER — Encounter (INDEPENDENT_AMBULATORY_CARE_PROVIDER_SITE_OTHER): Payer: Self-pay

## 2022-03-17 ENCOUNTER — Ambulatory Visit (INDEPENDENT_AMBULATORY_CARE_PROVIDER_SITE_OTHER): Payer: 59 | Admitting: Bariatrics

## 2022-03-22 ENCOUNTER — Encounter (INDEPENDENT_AMBULATORY_CARE_PROVIDER_SITE_OTHER): Payer: Self-pay | Admitting: Family Medicine

## 2022-03-22 ENCOUNTER — Ambulatory Visit (INDEPENDENT_AMBULATORY_CARE_PROVIDER_SITE_OTHER): Payer: 59 | Admitting: Family Medicine

## 2022-03-22 VITALS — BP 139/83 | HR 62 | Temp 97.8°F | Ht 66.0 in | Wt 186.0 lb

## 2022-03-22 DIAGNOSIS — R7303 Prediabetes: Secondary | ICD-10-CM | POA: Diagnosis not present

## 2022-03-22 DIAGNOSIS — Z6830 Body mass index (BMI) 30.0-30.9, adult: Secondary | ICD-10-CM | POA: Diagnosis not present

## 2022-03-22 DIAGNOSIS — E669 Obesity, unspecified: Secondary | ICD-10-CM

## 2022-03-23 ENCOUNTER — Other Ambulatory Visit (HOSPITAL_COMMUNITY): Payer: Self-pay

## 2022-03-28 NOTE — Progress Notes (Signed)
Chief Complaint:   OBESITY Sharon Hunter is here to discuss her progress with her obesity treatment plan along with follow-up of her obesity related diagnoses. Sharon Hunter is on the Category 1 Plan and states she is following her eating plan approximately 50% of the time. Sharon Hunter states she is hiking 3 miles 2 times per week.  Today's visit was #: 3 Starting weight: 191 lbs Starting date: 02/22/2022 Today's weight: 186 lbs Today's date: 03/22/2022 Total lbs lost to date: 5 Total lbs lost since last in-office visit: 5  Interim History: Pt notes she just had extra stuff not on plan, in addition to skipping meals (breakfast). She started hiking again. Pt denies hunger or cravings. She is getting 60 oz of water per day.  Subjective:   1. Prediabetes Sharon Hunter denies hunger or cravings. Medication: None  Assessment/Plan:  No orders of the defined types were placed in this encounter.   There are no discontinued medications.   No orders of the defined types were placed in this encounter.    1. Prediabetes Sharon Hunter will continue to work on weight loss, exercise, and decreasing simple carbohydrates to help decrease the risk of diabetes. No need for meds, as pt is doing great on meal plan and is without concerns.  2. Obesity, Current BMI 30.1 Sharon Hunter is currently in the action stage of change. As such, her goal is to continue with weight loss efforts. She has agreed to the Category 1 Plan and GOLO.   Exercise goals:  As is  Behavioral modification strategies: increasing lean protein intake and increasing water intake.  Sharon Hunter has agreed to follow-up with our clinic in 4 weeks. She was informed of the importance of frequent follow-up visits to maximize her success with intensive lifestyle modifications for her multiple health conditions.   Objective:   Blood pressure 139/83, pulse 62, temperature 97.8 F (36.6 C), height 5\' 6"  (1.676 m), weight 186 lb (84.4 kg), last menstrual period 10/03/2005, SpO2 99  %. Body mass index is 30.02 kg/m.  General: Cooperative, alert, well developed, in no acute distress. HEENT: Conjunctivae and lids unremarkable. Cardiovascular: Regular rhythm.  Lungs: Normal work of breathing. Neurologic: No focal deficits.   Lab Results  Component Value Date   CREATININE 0.74 02/22/2022   BUN 14 02/22/2022   NA 139 02/22/2022   K 3.9 02/22/2022   CL 101 02/22/2022   CO2 26 02/22/2022   Lab Results  Component Value Date   ALT 25 02/22/2022   AST 11 02/22/2022   ALKPHOS 81 02/22/2022   BILITOT 0.6 02/22/2022   Lab Results  Component Value Date   HGBA1C 5.7 (H) 02/22/2022   Lab Results  Component Value Date   INSULIN 11.3 02/22/2022   Lab Results  Component Value Date   TSH 0.753 02/22/2022   Lab Results  Component Value Date   CHOL 261 (H) 02/22/2022   HDL 99 02/22/2022   LDLCALC 145 (H) 02/22/2022   LDLDIRECT 132.6 05/23/2013   TRIG 104 02/22/2022   CHOLHDL 2.6 02/22/2022   Lab Results  Component Value Date   VD25OH 31.0 02/22/2022   VD25OH 32 01/30/2018   Lab Results  Component Value Date   WBC 7.5 02/22/2022   HGB 13.8 02/22/2022   HCT 42.3 02/22/2022   MCV 93 02/22/2022   PLT 289 02/22/2022    Attestation Statements:   Reviewed by clinician on day of visit: allergies, medications, problem list, medical history, surgical history, family history, social history, and previous  encounter notes.  Time spent on visit including pre-visit chart review and post-visit care and charting was 20 minutes.   I, Kyung Rudd, BS, CMA, am acting as transcriptionist for [employer], DO.  I have reviewed the above documentation for accuracy and completeness, and I agree with the above. -   I have reviewed the above documentation for accuracy and completeness, and I agree with the above. Carlye Grippe, D.O.  The 21st Century Cures Act was signed into law in 2016 which includes the topic of electronic health records.  This provides  immediate access to information in MyChart.  This includes consultation notes, operative notes, office notes, lab results and pathology reports.  If you have any questions about what you read please let us know at your next visit so we can discuss your concerns and take corrective action if need be.  We are right here with you.

## 2022-03-31 ENCOUNTER — Ambulatory Visit (INDEPENDENT_AMBULATORY_CARE_PROVIDER_SITE_OTHER): Payer: 59 | Admitting: Bariatrics

## 2022-04-06 ENCOUNTER — Other Ambulatory Visit (HOSPITAL_BASED_OUTPATIENT_CLINIC_OR_DEPARTMENT_OTHER): Payer: Self-pay | Admitting: Family Medicine

## 2022-04-06 ENCOUNTER — Telehealth: Payer: Self-pay | Admitting: Family Medicine

## 2022-04-06 DIAGNOSIS — Z1231 Encounter for screening mammogram for malignant neoplasm of breast: Secondary | ICD-10-CM

## 2022-04-06 NOTE — Telephone Encounter (Signed)
Pt called asking if there was any way Sharon Hunter could preform her physical since Dr. Etter Sjogren is unavailable on a lot of the days she is available.

## 2022-04-07 ENCOUNTER — Encounter: Payer: Self-pay | Admitting: Medical

## 2022-04-07 ENCOUNTER — Other Ambulatory Visit (HOSPITAL_BASED_OUTPATIENT_CLINIC_OR_DEPARTMENT_OTHER): Payer: Self-pay

## 2022-04-07 ENCOUNTER — Ambulatory Visit: Payer: 59 | Admitting: Medical

## 2022-04-07 VITALS — BP 125/68 | HR 56 | Temp 97.9°F | Resp 18 | Ht 66.0 in | Wt 186.5 lb

## 2022-04-07 DIAGNOSIS — Z6830 Body mass index (BMI) 30.0-30.9, adult: Secondary | ICD-10-CM | POA: Diagnosis not present

## 2022-04-07 DIAGNOSIS — I10 Essential (primary) hypertension: Secondary | ICD-10-CM | POA: Diagnosis not present

## 2022-04-07 DIAGNOSIS — E669 Obesity, unspecified: Secondary | ICD-10-CM | POA: Diagnosis not present

## 2022-04-07 MED ORDER — HYDROCHLOROTHIAZIDE 25 MG PO TABS
25.0000 mg | ORAL_TABLET | Freq: Every day | ORAL | 3 refills | Status: DC
  Filled 2022-04-07 – 2022-07-12 (×3): qty 90, 90d supply, fill #0

## 2022-04-07 MED ORDER — AMLODIPINE BESYLATE 5 MG PO TABS
5.0000 mg | ORAL_TABLET | Freq: Every day | ORAL | 3 refills | Status: DC
  Filled 2022-04-07 – 2022-06-07 (×2): qty 90, 90d supply, fill #0
  Filled 2022-06-29 – 2022-09-14 (×2): qty 90, 90d supply, fill #1
  Filled 2022-12-09: qty 90, 90d supply, fill #2
  Filled 2023-03-08: qty 90, 90d supply, fill #3

## 2022-04-07 MED ORDER — SERTRALINE HCL 50 MG PO TABS
50.0000 mg | ORAL_TABLET | Freq: Every day | ORAL | 3 refills | Status: DC
  Filled 2022-04-07 – 2022-07-12 (×3): qty 90, 90d supply, fill #0
  Filled 2022-10-04 – 2022-10-10 (×2): qty 90, 90d supply, fill #1
  Filled 2023-01-02: qty 90, 90d supply, fill #2

## 2022-04-07 NOTE — Progress Notes (Signed)
Subjective:    Patient ID: Sharon Hunter, female    DOB: May 01, 1961, 61 y.o.   MRN: [id number]  HPI Pt in for bp check   Pt is on amlodiipine 5 mg daily and hctz 25 mg daily. Pt states was on losartan 100 mg daily . She thinks losartan it never helped in the past.  Pt has some recent weight loss went down 218 lb to 186 lb throught wt loss management.  Anxiety well controlled with zoloft.     Review of Systems  Constitutional:  Negative for chills, fatigue and fever.  Respiratory:  Negative for cough, chest tightness and wheezing.   Cardiovascular:  Negative for chest pain and palpitations.  Gastrointestinal:  Negative for abdominal distention, abdominal pain, blood in stool, diarrhea, nausea and vomiting.  Musculoskeletal:  Negative for back pain and myalgias.  Skin:  Negative for rash.  Neurological:  Negative for dizziness, seizures and numbness.  Hematological:  Negative for adenopathy. Does not bruise/bleed easily.  Psychiatric/Behavioral:  Negative for behavioral problems, confusion, dysphoric mood, sleep disturbance and suicidal ideas. The patient is nervous/anxious.        See hpi.    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   Hypertension    IBS (irritable bowel syndrome) 08/01/2016   Joint pain    Lactose intolerance    Migraine    Murmur 02/03/2011   Obese    Ovarian failure    Palpitation    Pulmonary nodule, left 2009   last CT Nov 2011-- due 08/2011   Rheumatoid arthritis(714.0)    in remission , off meds as of Summer 2012   Vitamin D deficiency      Social History   Socioeconomic History   Marital status: Single    Spouse name: Not on file   Number of children: 1   Years of education: 17   Highest education level: Not on file  Occupational History   Occupation: RN  Tobacco Use   Smoking status: Former    Types: Cigarettes    Quit date:  10/03/1990    Years since quitting: 31.5    Passive exposure: Never   Smokeless tobacco: Never  Vaping Use   Vaping Use: Never used  Substance and Sexual Activity   Alcohol use: Yes    Alcohol/week: 3.0 standard drinks of alcohol    Types: 3 Standard drinks or equivalent per week    Comment: Red wine 4 oz 2-3 times a week   Drug use: No   Sexual activity: Not Currently    Partners: Male  Other Topics Concern   Not on file  Social History Narrative   Exercise-- jogs   Social Determinants of Health   Financial Resource Strain: Not on file  Food Insecurity: Not on file  Transportation Needs: Not on file  Physical Activity: Not on file  Stress: Not on file  Social Connections: Not on file  Intimate Partner Violence: Not on file    Past Surgical History:  Procedure Laterality Date   CHOLECYSTECTOMY  1999   COLONOSCOPY  10/31/11   EXPLORATORY LAPAROTOMY     TONSILLECTOMY  1969   WISDOM TOOTH EXTRACTION      Family History  Problem Relation Age of Onset   Arthritis Mother    Uterine cancer Mother    Hypertension Mother    Diabetes Mother  Alcohol abuse Father    Hyperlipidemia Father    Heart disease Father    High blood pressure Father    Sleep apnea Father    Heart disease Sister        cardiac stent   Diabetes Sister    Hyperlipidemia Sister    Hypertension Sister    Alcohol abuse Brother    Diabetes Brother    Hypertension Brother    Cancer Brother        lung   Cancer Maternal Aunt        lung----non smoker    Allergies  Allergen Reactions   Other Swelling    Contrast dye- head swelling per pt.   Contrast Media [Iodinated Contrast Media]     Current Outpatient Medications on File Prior to Visit  Medication Sig Dispense Refill   amLODipine (NORVASC) 5 MG tablet Take 1 tablet (5 mg total) by mouth daily. 90 tablet 0   azelastine (ASTELIN) 0.1 % nasal spray Place 1 spray into both nostrils 2 (two) times daily as directed. 30 mL 2   cycloSPORINE,  PF, (CEQUA) 0.09 % SOLN Place 1 drop into both eyes 2 (two) times daily. 60 each 2   diclofenac Sodium (VOLTAREN) 1 % GEL Apply 2-4 grams to affected area up to 4 times daily. 400 g 2   Doxylamine Succinate, Sleep, (UNISOM PO) Take 0.5 tablets by mouth at bedtime.     EYSUVIS 0.25 % SUSP Instill 1 drop in both eyes 4 times a day for 7 days, then 2 times a day for 7 days, then stop 8.3 mL 0   Golimumab 50 MG/0.5ML SOAJ INJECT 50 MG INTO THE SKIN EVERY 30 DAYS 1.5 mL 0   hydrochlorothiazide (HYDRODIURIL) 25 MG tablet Take 1 tablet (25 mg total) by mouth daily. 90 tablet 3   Ibuprofen (MOTRIN PO) Take by mouth as needed.     losartan (COZAAR) 100 MG tablet Take 1 tablet (100 mg total) by mouth daily. 30 tablet 2   MELATONIN PO Take 5-10 mg by mouth at bedtime.     mometasone (NASONEX) 50 MCG/ACT nasal spray Instill 2 sprays in the nose daily 17 g 12   sertraline (ZOLOFT) 50 MG tablet Take 50 mg by mouth daily.     No current facility-administered medications on file prior to visit.    BP 125/60   Pulse (!) 56   Temp 97.9 F (36.6 C)   Resp 18   Ht '5\' 6"'$  (1.676 m)   Wt 186 lb 8 oz (84.6 kg)   LMP 10/03/2005   SpO2 100%   BMI 30.10 kg/m        Objective:   Physical Exam  General- No acute distress. Pleasant patient. Neck- Full range of motion, no jvd Lungs- Clear, even and unlabored. Heart- regular rate and rhythm. Neurologic- CNII- XII grossly intact.       Assessment & Plan:   Patient Instructions  Htn- bp very well controlled. On amlodipine 5 mg daily and hctz 25 mg daily. Not on losartan and bp is very well controlled without losartan so stay off.   Obesity with successfully weight loss. Good job on your effort. Continue with healthy wt loss management.  Anxiety well controlled with zoloft.  Follow up as regularly scheduled with pcp or sooner if needed.    Mackie Pai, PA-C

## 2022-04-07 NOTE — Patient Instructions (Addendum)
Htn- bp very well controlled. On amlodipine 5 mg daily and hctz 25 mg daily. Not on losartan and bp is very well controlled without losartan so stay off.   Obesity with successfully weight loss. Good job on your effort. Continue with healthy wt loss management.  Anxiety well controlled with zoloft.  Follow up as regularly scheduled with pcp or sooner if needed.

## 2022-04-12 ENCOUNTER — Other Ambulatory Visit (HOSPITAL_COMMUNITY): Payer: Self-pay

## 2022-04-13 ENCOUNTER — Other Ambulatory Visit (HOSPITAL_COMMUNITY): Payer: Self-pay

## 2022-04-18 NOTE — Progress Notes (Signed)
Office Visit Note  Patient: Sharon Hunter             Date of Birth: [birth year]           MRN: [id number]             PCP: [name], DO Referring: [name], * Visit Date: 05/02/2022 Occupation: '@GUAROCC'$ @  Subjective:  Right CMC joint pain   History of Present Illness: MARGA GRAMAJO is a 61 y.o. female with history of seropositive rheumatoid arthritis and osteoarthritis.   She is on simponi 50 mg sq injections once monthly.  She continues to tolerate Simponi without any side effects or injection site reactions.  She has not missed any doses of Simponi recently.  She denies any new medical conditions.  She has not had any recent or recurrent infections.  She is currently experiencing pain in both shoulders and both CMC joints.  Her pain is most severe in the right [facility] joint.  She had a right CMC joint injection on 09/10/2021 and right subacromial bursa injection on 02/14/22.  Both injections provided temporary relief.  She has been using Voltaren gel topically as needed for symptomatic relief.  She takes ibuprofen very sparingly for moderate to severe pain relief.  Her pain is currently a 3/10.  She is experiencing increased fatigue today despite sleeping well last night.  She [facility] and has been working on weight loss.   Activities of Daily Living:  Patient reports morning stiffness for all day. Patient Reports nocturnal pain.  Difficulty dressing/grooming: Reports Difficulty climbing stairs: Denies Difficulty getting out of chair: Denies Difficulty using hands for taps, buttons, cutlery, and/or writing: Reports  Review of Systems  Constitutional:  Positive for fatigue.  HENT:  Positive for mouth dryness. Negative for mouth sores.   Eyes:  Positive for dryness.  Respiratory:  Negative for shortness of breath.   Cardiovascular:  Negative for chest pain and palpitations.  Gastrointestinal:  Negative for blood  in stool, constipation and diarrhea.  Endocrine: Negative for increased urination.  Genitourinary:  Negative for involuntary urination.  Musculoskeletal:  Positive for joint pain, joint pain, joint swelling, myalgias, morning stiffness, muscle tenderness and myalgias. Negative for muscle weakness.  Skin:  Negative for color change, rash, hair loss and sensitivity to sunlight.  Allergic/Immunologic: Negative for susceptible to infections.  Neurological:  Negative for dizziness and headaches.  Hematological:  Negative for swollen glands.  Psychiatric/Behavioral:  Negative for depressed mood and sleep disturbance. The patient is nervous/anxious.     PMFS History:  Patient Active Problem List   Diagnosis Date Noted  . Prediabetes 03/09/2022  . Vitamin B12 deficiency 03/09/2022  . Primary hypertension 02/22/2022  . Hyperlipidemia 02/22/2022  . GAD (generalized anxiety disorder) 02/22/2022  . Vitamin D deficiency 02/22/2022  . Ear pain, left 08/13/2021  . History of anxiety 03/27/2017  . History of hypertension 03/27/2017  . History of bundle branch block 03/27/2017  . Primary osteoarthritis of both hands 12/22/2016  . History of shingles 08/02/2016  . High risk medication use 08/02/2016  . Hypertension 08/01/2016  . IBS (irritable bowel syndrome) 08/01/2016  . Anxiety 08/01/2016  . 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 Rate-related bundle branch block 01/30/2015  . Palpitations 01/30/2015  . Pulmonary nodule 07/04/2011  . Murmur 02/03/2011  . Rheumatoid arthritis ([place]) 02/03/2011  . Hair loss 02/03/2011  . Fatigue 02/03/2011    Past  Medical History:  Diagnosis Date  . Anxiety   . Breast lump    stable-- gets annual mammogram  . 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 Hypertension   . IBS (irritable bowel syndrome) 08/01/2016  . Joint pain   . Lactose intolerance   . Migraine   . Murmur 02/03/2011  . Obese   . Ovarian failure   . Palpitation   .  Pulmonary nodule, left 2009   last CT Nov 2011-- due 08/2011  . Rheumatoid arthritis(714.0)    in remission , off meds as of Summer 2012  . Vitamin D deficiency     Family History  Problem Relation Age of Onset  . Arthritis Mother   . Uterine cancer Mother   . Hypertension Mother   . Diabetes Mother   . Alcohol abuse Father   . Hyperlipidemia Father   . Heart disease Father   . High blood pressure Father   . Sleep apnea Father   . Heart disease Sister        cardiac stent  . Diabetes Sister   . Hyperlipidemia Sister   . Hypertension Sister   . Alcohol abuse Brother   . Diabetes Brother   . Hypertension Brother   . Cancer Brother        lung  . Cancer Maternal Aunt        lung----non smoker   Past Surgical History:  Procedure Laterality Date  . CHOLECYSTECTOMY  1999  . COLONOSCOPY  10/31/11  . EXPLORATORY LAPAROTOMY    . TONSILLECTOMY  1969  . WISDOM TOOTH EXTRACTION     Social History   Social History Narrative   Exercise-- jogs   Immunization History  Administered Date(s) Administered  . Influenza Split 07/03/2013  . Influenza,inj,Quad PF,6-35 Mos 07/17/2020  . Influenza-Unspecified 07/03/2014, 07/04/2015  . PFIZER(Purple Top)SARS-COV-2 Vaccination 01/13/2020, 05/15/2020  . Tdap 12/01/2009     Objective: Vital Signs: BP (!) 141/83 (BP Location: Left Arm, Patient Position: Sitting, Cuff Size: Normal)   Pulse (!) 54   Resp 13   Ht 5' 6.5" (1.689 m)   Wt 186 lb 12.8 oz (84.7 kg)   LMP 10/03/2005   BMI 29.70 kg/m    Physical Exam Vitals and nursing note reviewed.  Constitutional:      Appearance: She is well-developed.  HENT:     Head: Normocephalic and atraumatic.  Eyes:     Conjunctiva/sclera: Conjunctivae normal.  Cardiovascular:     Rate and Rhythm: Normal rate and regular rhythm.     Heart sounds: Normal heart sounds.  Pulmonary:     Effort: Pulmonary effort is normal.     Breath sounds: Normal breath sounds.  Abdominal:     General: Bowel  sounds are normal.     Palpations: Abdomen is soft.  Musculoskeletal:     Cervical back: Normal range of motion.  Skin:    General: Skin is warm and dry.     Capillary Refill: Capillary refill takes less than 2 seconds.  Neurological:     Mental Status: She is alert and oriented to person, place, and time.  Psychiatric:        Behavior: Behavior normal.     Musculoskeletal Exam: C-spine has good range of motion.  No midline spinal tenderness.  Tenderness over both SI joints.  Painful range of motion of both shoulder joints with some stiffness especially with internal rotation.  Elbow joints have good range of motion with no tenderness or inflammation.  Wrist joints have good range of motion with no tenderness or synovitis.  Tenderness over both CMC joints, right greater than left.  No tenderness or synovitis over MCP or PIP joints.  Complete fist formation bilaterally.  Hip joints have good range of motion with no groin pain.  Knee joints have good range of motion with no warmth or effusion.  Ankle joints have good range of motion with no tenderness or synovitis.  No tenderness or synovitis over MTP joints.  CDAI Exam: CDAI Score: -- Patient Global: --; Provider Global: -- Swollen: --; Tender: -- Joint Exam 05/02/2022   No joint exam has been documented for this visit   There is currently no information documented on the homunculus. Go to the Rheumatology activity and complete the homunculus joint exam.  Investigation: No additional findings.  Imaging: MM 3D SCREEN BREAST BILATERAL  Result Date: 04/26/2022 CLINICAL DATA:  Screening. EXAM: DIGITAL SCREENING BILATERAL MAMMOGRAM WITH TOMOSYNTHESIS AND CAD TECHNIQUE: Bilateral screening digital craniocaudal and mediolateral oblique mammograms were obtained. Bilateral screening digital breast tomosynthesis was performed. The images were evaluated with computer-aided detection. COMPARISON:  Previous exam(s). ACR Breast Density Category b:  There are scattered areas of fibroglandular density. FINDINGS: There are no findings suspicious for malignancy. IMPRESSION: No mammographic evidence of malignancy. A result letter of this screening mammogram will be mailed directly to the patient. RECOMMENDATION: Screening mammogram in one year. (Code:SM-B-01Y) BI-RADS CATEGORY  1: Negative. Electronically Signed   By: Ileana Roup M.D.   On: 04/26/2022 13:31    Recent Labs: Lab Results  Component Value Date   WBC 7.5 02/22/2022   HGB 13.8 02/22/2022   PLT 289 02/22/2022   NA 139 02/22/2022   K 3.9 02/22/2022   CL 101 02/22/2022   CO2 26 02/22/2022   GLUCOSE 110 (H) 02/22/2022   BUN 14 02/22/2022   CREATININE 0.74 02/22/2022   BILITOT 0.6 02/22/2022   ALKPHOS 81 02/22/2022   AST 11 02/22/2022   ALT 25 02/22/2022   PROT 7.1 02/22/2022   ALBUMIN 4.7 02/22/2022   CALCIUM 9.6 02/22/2022   GFRAA 86 01/07/2021   QFTBGOLDPLUS NEGATIVE 09/12/2018    Speciality Comments: Prior therapy: methotrexate, remicade, orencia, and enbrel (inadequate response)  Procedures:  No procedures performed Allergies: Other and Contrast media [iodinated contrast media]   Assessment / Plan:     Visit Diagnoses: Rheumatoid arthritis involving multiple sites with positive rheumatoid factor ([place]): She has no synovitis on examination today.  She continues to experience pain in both shoulders and both CMC joints. She had a right CMC joint injection on 09/10/21 and right subacromial bursa injection on 02/14/22, which provided temporary relief.  She has no inflammation on examination today.  She uses voltaren gel topically as needed and takes ibuprofen very sparingly for moderate to severe pain relief.  She remains on simponi 50 mg sq injections every 28 days, and she continues to tolerate simponi without any side effects or injection site reactions.  She has not missed any doses of simponi recently.  She does not want to make any medication changes at this time.  She  will remain on simponi as prescribed.  She will follow up in 5 months or sooner if needed.   High risk medication use - Simponi 50 mg sq injections every 28 days. CBC and CMP updated on 02/22/22.  She will be having updated lab work at [facility] will have results forwarded to our office.  Discussed that she  will continue to require CBC and CMP every 3 months to monitor for drug toxicity.  Standing orders for CBC and CMP remain in place. TB gold negative on 07/27/21.   She has not had any recent or recurrent infections.  Discussed the importance of holding simponi if she develops signs or symptoms of an infection and to resume once the infection has completely cleared.   Chronic pain of both shoulders: She continues to experience intermittent pain and stiffness in both shoulders, especially the right side.  She experiences nocturnal pain intermittently.  She has been trying to perform shoulder range of motion exercises to prevent adhesive capsulitis.  She had a right subacromial cortisone injection on 02/14/2022 with provided temporary relief.  She would like to hold off on scheduling a repeat injection at this time.  Primary osteoarthritis of both hands: She presents today with [facility] joint tenderness and discomfort bilaterally.  Her pain has been most severe on the right side.  She currently rates her pain a 3 out of 10 but has been experiencing nocturnal pain at times.  Discussed the importance of joint protection and muscle strengthening.  She had a right CMC joint injection on 09/10/2021 which brought a temporary relief.  She declined scheduling a repeat injection at this time.  I also discussed the use of a CMC joint brace.  She was advised to notify us if her symptoms persist or worsen.  Other fatigue:  She continues to experience intermittent fatigue.  Discussed the importance of regular exercise and good sleep hygiene.   Sicca syndrome ([place]): Chronic, unchanged.   Other  medical conditions are listed as follows:  History of vitamin D deficiency  History of anxiety  History of hypertension  Pulmonary nodule  History of bundle branch block  History of shingles  Orders: No orders of the defined types were placed in this encounter.  No orders of the defined types were placed in this encounter.   Follow-Up Instructions: Return in about 5 months (around 10/02/2022) for Rheumatoid arthritis, Osteoarthritis.   Ofilia Neas, PA-C  Note - This record has been created using Dragon software.  Chart creation errors have been sought, but may not always  have been located. Such creation errors do not reflect on  the standard of medical care.

## 2022-04-20 ENCOUNTER — Other Ambulatory Visit (HOSPITAL_COMMUNITY): Payer: Self-pay

## 2022-04-25 ENCOUNTER — Encounter (INDEPENDENT_AMBULATORY_CARE_PROVIDER_SITE_OTHER): Payer: Self-pay | Admitting: Family Medicine

## 2022-04-25 ENCOUNTER — Ambulatory Visit (HOSPITAL_BASED_OUTPATIENT_CLINIC_OR_DEPARTMENT_OTHER)
Admission: RE | Admit: 2022-04-25 | Discharge: 2022-04-25 | Disposition: A | Discharge status: Home / Self Care | Payer: 59 | Source: Ambulatory Visit | Attending: Family Medicine | Admitting: Family Medicine

## 2022-04-25 ENCOUNTER — Encounter (HOSPITAL_BASED_OUTPATIENT_CLINIC_OR_DEPARTMENT_OTHER): Payer: Self-pay

## 2022-04-25 ENCOUNTER — Ambulatory Visit (INDEPENDENT_AMBULATORY_CARE_PROVIDER_SITE_OTHER): Payer: 59 | Admitting: Family Medicine

## 2022-04-25 VITALS — BP 142/84 | HR 59 | Temp 98.0°F | Ht 66.0 in | Wt 183.0 lb

## 2022-04-25 DIAGNOSIS — Z9189 Other specified personal risk factors, not elsewhere classified: Secondary | ICD-10-CM

## 2022-04-25 DIAGNOSIS — E669 Obesity, unspecified: Secondary | ICD-10-CM | POA: Diagnosis not present

## 2022-04-25 DIAGNOSIS — Z6829 Body mass index (BMI) 29.0-29.9, adult: Secondary | ICD-10-CM

## 2022-04-25 DIAGNOSIS — Z1231 Encounter for screening mammogram for malignant neoplasm of breast: Secondary | ICD-10-CM | POA: Diagnosis not present

## 2022-04-25 DIAGNOSIS — E538 Deficiency of other specified B group vitamins: Secondary | ICD-10-CM | POA: Diagnosis not present

## 2022-04-25 DIAGNOSIS — E559 Vitamin D deficiency, unspecified: Secondary | ICD-10-CM

## 2022-04-25 DIAGNOSIS — I10 Essential (primary) hypertension: Secondary | ICD-10-CM | POA: Diagnosis not present

## 2022-04-28 NOTE — Progress Notes (Signed)
Chief Complaint:   OBESITY Sharon Hunter is here to discuss her progress with her obesity treatment plan along with follow-up of her obesity related diagnoses. Sharon Hunter is on the Category 1 Plan and GOLO and states she is following her eating plan approximately 50% of the time. Sharon Hunter states she is walking 2.5 miles 2 times per week.  Today's visit was #: 4 Starting weight: 191 lbs Starting date: 02/22/2022 Today's weight: 183 lbs Today's date: 04/21/2022 Total lbs lost to date: 8 Total lbs lost since last in-office visit: 3  Interim History: Cydney was on vacation and still lost 3 lbs. She is happy she was away with her girlfriends in [place] for a week.  Subjective:   1. Vitamin B12 deficiency Sharon Hunter is taking a B12 supplement daily. She cannot recall the dose. She is feeling more energetic and better overall.  2. Vitamin D deficiency Sharon Hunter is on OTC Vit D but unsure of dose. She is not consistent with use. She can't take pills, thus does gummies.  3. Essential hypertension Naturi had coffee this morning and it always makes her BP go up. BP has been controlled otherwise.  4. At risk for activity intolerance Takita is at risk for exercise intolerance due to rheumatoid arthritis.  Assessment/Plan:  No orders of the defined types were placed in this encounter.   There are no discontinued medications.   No orders of the defined types were placed in this encounter.    1. Vitamin B12 deficiency The diagnosis was reviewed with the patient. Counseling provided today, see below. We will continue to monitor. Orders and follow up as documented in patient record. Check fasting labs at next OV.  Counseling The body needs vitamin B12: to make red blood cells; to make DNA; and to help the nerves work properly so they can carry messages from the brain to the body.  The main causes of vitamin B12 deficiency include dietary deficiency, digestive diseases, pernicious anemia, and having a surgery in which part  of the stomach or small intestine is removed.  Certain medicines can make it harder for the body to absorb vitamin B12. These medicines include: heartburn medications; some antibiotics; some medications used to treat diabetes, gout, and high cholesterol.  In some cases, there are no symptoms of this condition. If the condition leads to anemia or nerve damage, various symptoms can occur, such as weakness or fatigue, shortness of breath, and numbness or tingling in your hands and feet.   Treatment:  May include taking vitamin B12 supplements.  Avoid alcohol.  Eat lots of healthy foods that contain vitamin B12: Beef, pork, chicken, [place], and organ meats, such as liver.  Seafood: This includes clams, rainbow trout, salmon, tuna, and haddock. Eggs.  Cereal and dairy products that are fortified: This means that vitamin B12 has been added to the food.   2. Vitamin D deficiency Low Vitamin D level contributes to fatigue and are associated with obesity, breast, and colon cancer. She agrees to continue OTC Vitamin D daily and will follow-up for routine testing of Vitamin D, at least 2-3 times per year to avoid over-replacement. Check fasting labs at next OV.  3. Essential hypertension Sharon Hunter is working on healthy weight loss and exercise to improve blood pressure control. We will watch for signs of hypotension as she continues her lifestyle modifications. Check BP at home. Continue prudent nutritional plan and increase exercise and water intake. Decrease salt and continue Norvasc and HCTZ.  4. At risk  for activity intolerance Sharon Hunter was given approximately 10 minutes of counseling today regarding her increased risk for exercise intolerance.  We discussed patient's specific personal and medical issues that raise our concern.  She was advised of strategies to prevent injury and ways to improve her cardiopulmonary fitness levels slowly over time.  We additionally discussed various fitness trackers and smart phone  apps to help motivate the patient to stay on track.   5. Obesity, Current BMI 29.6 Sharon Hunter is currently in the action stage of change. As such, her goal is to continue with weight loss efforts. She has agreed to the Category 1 Plan and [place].   Goal: eat fish twice a week and increase walking to 3 days a week.  Exercise goals: For substantial health benefits, adults should do at least 150 minutes (2 hours and 30 minutes) a week of moderate-intensity, or 75 minutes (1 hour and 15 minutes) a week of vigorous-intensity aerobic physical activity, or an equivalent combination of moderate- and vigorous-intensity aerobic activity. Aerobic activity should be performed in episodes of at least 10 minutes, and preferably, it should be spread throughout the week.  Behavioral modification strategies: increasing lean protein intake, decreasing simple carbohydrates, and planning for success.  Sharon Hunter has agreed to follow-up with our clinic in 4 weeks, with fasting blood work. She was informed of the importance of frequent follow-up visits to maximize her success with intensive lifestyle modifications for her multiple health conditions.   Objective:   Blood pressure (!) 142/84, pulse (!) 59, temperature 98 F (36.7 C), height '5\' 6"'$  (1.676 m), weight 183 lb (83 kg), last menstrual period 10/03/2005, SpO2 100 %. Body mass index is 29.54 kg/m.  General: Cooperative, alert, well developed, in no acute distress. HEENT: Conjunctivae and lids unremarkable. Cardiovascular: Regular rhythm.  Lungs: Normal work of breathing. Neurologic: No focal deficits.   Lab Results  Component Value Date   CREATININE 0.74 02/22/2022   BUN 14 02/22/2022   NA 139 02/22/2022   K 3.9 02/22/2022   CL 101 02/22/2022   CO2 26 02/22/2022   Lab Results  Component Value Date   ALT 25 02/22/2022   AST 11 02/22/2022   ALKPHOS 81 02/22/2022   BILITOT 0.6 02/22/2022   Lab Results  Component Value Date   HGBA1C 5.7 (H) 02/22/2022    Lab Results  Component Value Date   INSULIN 11.3 02/22/2022   Lab Results  Component Value Date   TSH 0.753 02/22/2022   Lab Results  Component Value Date   CHOL 261 (H) 02/22/2022   HDL 99 02/22/2022   LDLCALC 145 (H) 02/22/2022   LDLDIRECT 132.6 05/23/2013   TRIG 104 02/22/2022   CHOLHDL 2.6 02/22/2022   Lab Results  Component Value Date   VD25OH 31.0 02/22/2022   VD25OH 32 01/30/2018   Lab Results  Component Value Date   WBC 7.5 02/22/2022   HGB 13.8 02/22/2022   HCT 42.3 02/22/2022   MCV 93 02/22/2022   PLT 289 02/22/2022    Attestation Statements:   Reviewed by clinician on day of visit: allergies, medications, problem list, medical history, surgical history, family history, social history, and previous encounter notes.  I, Kathlene November, BS, CMA, am acting as transcriptionist for [employer], DO.  I have reviewed the above documentation for accuracy and completeness, and I agree with the above. Marjory Sneddon, D.O.  The [place] was signed into law in 2016 which includes the topic of electronic health records.  This provides immediate access to information in MyChart.  This includes consultation notes, operative notes, office notes, lab results and pathology reports.  If you have any questions about what you read please let us know at your next visit so we can discuss your concerns and take corrective action if need be.  We are right here with you.

## 2022-05-02 ENCOUNTER — Other Ambulatory Visit (HOSPITAL_COMMUNITY): Payer: Self-pay

## 2022-05-02 ENCOUNTER — Encounter: Payer: Self-pay | Admitting: Physician Assistant

## 2022-05-02 ENCOUNTER — Other Ambulatory Visit: Payer: Self-pay | Admitting: Pharmacist

## 2022-05-02 ENCOUNTER — Ambulatory Visit: Payer: 59 | Attending: Physician Assistant | Admitting: Physician Assistant

## 2022-05-02 VITALS — BP 141/83 | HR 54 | Resp 13 | Ht 66.5 in | Wt 186.8 lb

## 2022-05-02 DIAGNOSIS — R5383 Other fatigue: Secondary | ICD-10-CM | POA: Diagnosis not present

## 2022-05-02 DIAGNOSIS — Z8679 Personal history of other diseases of the circulatory system: Secondary | ICD-10-CM

## 2022-05-02 DIAGNOSIS — Z8659 Personal history of other mental and behavioral disorders: Secondary | ICD-10-CM

## 2022-05-02 DIAGNOSIS — R911 Solitary pulmonary nodule: Secondary | ICD-10-CM

## 2022-05-02 DIAGNOSIS — Z8619 Personal history of other infectious and parasitic diseases: Secondary | ICD-10-CM

## 2022-05-02 DIAGNOSIS — M3500 Sicca syndrome, unspecified: Secondary | ICD-10-CM | POA: Diagnosis not present

## 2022-05-02 DIAGNOSIS — M25511 Pain in right shoulder: Secondary | ICD-10-CM | POA: Diagnosis not present

## 2022-05-02 DIAGNOSIS — M19041 Primary osteoarthritis, right hand: Secondary | ICD-10-CM

## 2022-05-02 DIAGNOSIS — G8929 Other chronic pain: Secondary | ICD-10-CM

## 2022-05-02 DIAGNOSIS — M25512 Pain in left shoulder: Secondary | ICD-10-CM

## 2022-05-02 DIAGNOSIS — Z79899 Other long term (current) drug therapy: Secondary | ICD-10-CM | POA: Diagnosis not present

## 2022-05-02 DIAGNOSIS — Z8639 Personal history of other endocrine, nutritional and metabolic disease: Secondary | ICD-10-CM

## 2022-05-02 DIAGNOSIS — M0579 Rheumatoid arthritis with rheumatoid factor of multiple sites without organ or systems involvement: Secondary | ICD-10-CM

## 2022-05-02 DIAGNOSIS — M19042 Primary osteoarthritis, left hand: Secondary | ICD-10-CM

## 2022-05-02 MED ORDER — GOLIMUMAB 50 MG/0.5ML ~~LOC~~ SOAJ
SUBCUTANEOUS | 0 refills | Status: DC
  Filled 2022-05-02: qty 0.5, fill #0
  Filled 2022-05-10: qty 0.5, 30d supply, fill #0

## 2022-05-02 NOTE — Telephone Encounter (Signed)
Rx for Simponi '50mg'$  SQ every 30 days sent to [facility] for hospital-based cost pricing.   Total qty remaining is 0.17m   DKnox Saliva PharmD, MPH, BCPS, CPP Clinical Pharmacist (Rheumatology and Pulmonology)

## 2022-05-02 NOTE — Patient Instructions (Signed)
Standing Labs We placed an order today for your standing lab work.   Please have your standing labs drawn in August and every 3 months   CBC with and CMP with GFR   If possible, please have your labs drawn 2 weeks prior to your appointment so that the provider can discuss your results at your appointment.  Please note that you may see your imaging and lab results in [facility] before we have reviewed them. We may be awaiting multiple results to interpret others before contacting you. Please allow our office up to 72 hours to thoroughly review all of the results before contacting the office for clarification of your results.  We have open lab daily: Monday through Thursday from 1:30-4:30 PM and Friday from 1:30-4:00 PM at the office of Dr. Bo Merino, [facility].   Please be advised, all patients with office appointments requiring lab work will take precedent over walk-in lab work.  If possible, please come for your lab work on Monday and Friday afternoons, as you may experience shorter wait times. The office is located at [address], [place], [place], [place] [address] No appointment is necessary.   Labs are drawn by [facility]. Please bring your co-pay at the time of your lab draw.  You may receive a bill from [facility] for your lab work.  Please note if you are on Hydroxychloroquine and and an order has been placed for a Hydroxychloroquine level, you will need to have it drawn 4 hours or more after your last dose.  If you wish to have your labs drawn at another location, please call the office 24 hours in advance to send orders.  If you have any questions regarding directions or hours of operation,  please call [phone].   As a reminder, please drink plenty of water prior to coming for your lab work. Thanks!

## 2022-05-10 ENCOUNTER — Other Ambulatory Visit (HOSPITAL_COMMUNITY): Payer: Self-pay

## 2022-05-11 ENCOUNTER — Encounter (INDEPENDENT_AMBULATORY_CARE_PROVIDER_SITE_OTHER): Payer: Self-pay

## 2022-05-16 ENCOUNTER — Other Ambulatory Visit (HOSPITAL_COMMUNITY): Payer: Self-pay

## 2022-05-19 ENCOUNTER — Other Ambulatory Visit (HOSPITAL_COMMUNITY): Payer: Self-pay

## 2022-05-26 ENCOUNTER — Other Ambulatory Visit (HOSPITAL_BASED_OUTPATIENT_CLINIC_OR_DEPARTMENT_OTHER): Payer: Self-pay

## 2022-05-26 ENCOUNTER — Ambulatory Visit (INDEPENDENT_AMBULATORY_CARE_PROVIDER_SITE_OTHER): Payer: 59 | Admitting: Family Medicine

## 2022-05-26 ENCOUNTER — Encounter (INDEPENDENT_AMBULATORY_CARE_PROVIDER_SITE_OTHER): Payer: Self-pay | Admitting: Family Medicine

## 2022-05-26 ENCOUNTER — Encounter: Payer: Self-pay | Admitting: Family Medicine

## 2022-05-26 VITALS — Ht 66.0 in

## 2022-05-26 VITALS — BP 126/80 | HR 55 | Temp 97.6°F | Resp 18 | Ht 66.0 in | Wt 184.6 lb

## 2022-05-26 DIAGNOSIS — Z91199 Patient's noncompliance with other medical treatment and regimen due to unspecified reason: Secondary | ICD-10-CM

## 2022-05-26 DIAGNOSIS — R9431 Abnormal electrocardiogram [ECG] [EKG]: Secondary | ICD-10-CM

## 2022-05-26 DIAGNOSIS — E538 Deficiency of other specified B group vitamins: Secondary | ICD-10-CM | POA: Diagnosis not present

## 2022-05-26 DIAGNOSIS — I10 Essential (primary) hypertension: Secondary | ICD-10-CM | POA: Diagnosis not present

## 2022-05-26 DIAGNOSIS — Z0000 Encounter for general adult medical examination without abnormal findings: Secondary | ICD-10-CM

## 2022-05-26 DIAGNOSIS — L710 Perioral dermatitis: Secondary | ICD-10-CM | POA: Insufficient documentation

## 2022-05-26 DIAGNOSIS — M0579 Rheumatoid arthritis with rheumatoid factor of multiple sites without organ or systems involvement: Secondary | ICD-10-CM

## 2022-05-26 DIAGNOSIS — Z1159 Encounter for screening for other viral diseases: Secondary | ICD-10-CM | POA: Diagnosis not present

## 2022-05-26 DIAGNOSIS — E559 Vitamin D deficiency, unspecified: Secondary | ICD-10-CM

## 2022-05-26 DIAGNOSIS — Z1211 Encounter for screening for malignant neoplasm of colon: Secondary | ICD-10-CM

## 2022-05-26 DIAGNOSIS — E7849 Other hyperlipidemia: Secondary | ICD-10-CM

## 2022-05-26 MED ORDER — METRONIDAZOLE 1 % EX GEL
Freq: Every day | CUTANEOUS | 0 refills | Status: DC
  Filled 2022-05-26: qty 60, 30d supply, fill #0

## 2022-05-26 NOTE — Assessment & Plan Note (Signed)
Well controlled, no changes to meds. Encouraged heart healthy diet such as the DASH diet and exercise as tolerated.  °

## 2022-05-26 NOTE — Patient Instructions (Signed)
Preventive Care 40-61 Years Old, Female Preventive care refers to lifestyle choices and visits with your health care provider that can promote health and wellness. Preventive care visits are also called wellness exams. What can I expect for my preventive care visit? Counseling Your health care provider may ask you questions about your: Medical history, including: Past medical problems. Family medical history. Pregnancy history. Current health, including: Menstrual cycle. Method of birth control. Emotional well-being. Home life and relationship well-being. Sexual activity and sexual health. Lifestyle, including: Alcohol, nicotine or tobacco, and drug use. Access to firearms. Diet, exercise, and sleep habits. Work and work environment. Sunscreen use. Safety issues such as seatbelt and bike helmet use. Physical exam Your health care provider will check your: Height and weight. These may be used to calculate your BMI (body mass index). BMI is a measurement that tells if you are at a healthy weight. Waist circumference. This measures the distance around your waistline. This measurement also tells if you are at a healthy weight and may help predict your risk of certain diseases, such as type 2 diabetes and high blood pressure. Heart rate and blood pressure. Body temperature. Skin for abnormal spots. What immunizations do I need?  Vaccines are usually given at various ages, according to a schedule. Your health care provider will recommend vaccines for you based on your age, medical history, and lifestyle or other factors, such as travel or where you work. What tests do I need? Screening Your health care provider may recommend screening tests for certain conditions. This may include: Lipid and cholesterol levels. Diabetes screening. This is done by checking your blood sugar (glucose) after you have not eaten for a while (fasting). Pelvic exam and Pap test. Hepatitis B test. Hepatitis C  test. HIV (human immunodeficiency virus) test. STI (sexually transmitted infection) testing, if you are at risk. Lung cancer screening. Colorectal cancer screening. Mammogram. Talk with your health care provider about when you should start having regular mammograms. This may depend on whether you have a family history of breast cancer. BRCA-related cancer screening. This may be done if you have a family history of breast, ovarian, tubal, or peritoneal cancers. Bone density scan. This is done to screen for osteoporosis. Talk with your health care provider about your test results, treatment options, and if necessary, the need for more tests. Follow these instructions at home: Eating and drinking  Eat a diet that includes fresh fruits and vegetables, whole grains, lean protein, and low-fat dairy products. Take vitamin and mineral supplements as recommended by your health care provider. Do not drink alcohol if: Your health care provider tells you not to drink. You are pregnant, may be pregnant, or are planning to become pregnant. If you drink alcohol: Limit how much you have to 0-1 drink a day. Know how much alcohol is in your drink. In the [place] one drink equals one 12 oz bottle of beer (355 mL), one 5 oz glass of wine (148 mL), or one 1 oz glass of hard liquor (44 mL). Lifestyle Brush your teeth every morning and night with fluoride toothpaste. Floss one time each day. Exercise for at least 30 minutes 5 or more days each week. Do not use any products that contain nicotine or tobacco. These products include cigarettes, chewing tobacco, and vaping devices, such as e-cigarettes. If you need help quitting, ask your health care provider. Do not use drugs. If you are sexually active, practice safe sex. Use a condom or other form of protection to   prevent STIs. If you do not wish to become pregnant, use a form of birth control. If you plan to become pregnant, see your health care provider for a  prepregnancy visit. Take aspirin only as told by your health care provider. Make sure that you understand how much to take and what form to take. Work with your health care provider to find out whether it is safe and beneficial for you to take aspirin daily. Find healthy ways to manage stress, such as: Meditation, yoga, or listening to music. Journaling. Talking to a trusted person. Spending time with friends and family. Minimize exposure to UV radiation to reduce your risk of skin cancer. Safety Always wear your seat belt while driving or riding in a vehicle. Do not drive: If you have been drinking alcohol. Do not ride with someone who has been drinking. When you are tired or distracted. While texting. If you have been using any mind-altering substances or drugs. Wear a helmet and other protective equipment during sports activities. If you have firearms in your house, make sure you follow all gun safety procedures. Seek help if you have been physically or sexually abused. What's next? Visit your health care provider once a year for an annual wellness visit. Ask your health care provider how often you should have your eyes and teeth checked. Stay up to date on all vaccines. This information is not intended to replace advice given to you by your health care provider. Make sure you discuss any questions you have with your health care provider. Document Revised: 03/17/2021 Document Reviewed: 03/17/2021 Elsevier Patient Education  [place].

## 2022-05-26 NOTE — Progress Notes (Signed)
Subjective:     Sharon Hunter is a 61 y.o. female and is here for a comprehensive physical exam. The patient reports problems - [facility] told her ekg was abnormal. .  Social History   Socioeconomic History   Marital status: Single    Spouse name: Not on file   Number of children: 1   Years of education: 17   Highest education level: Not on file  Occupational History   Occupation: RN  Tobacco Use   Smoking status: Former    Types: Cigarettes    Quit date: 10/03/1990    Years since quitting: 31.6    Passive exposure: Past   Smokeless tobacco: Never  Vaping Use   Vaping Use: Never used  Substance and Sexual Activity   Alcohol use: Yes    Alcohol/week: 3.0 standard drinks of alcohol    Types: 3 Standard drinks or equivalent per week    Comment: Red wine 4 oz occ   Drug use: No   Sexual activity: Not Currently    Partners: Male  Other Topics Concern   Not on file  Social History Narrative   Exercise-- jogs   Social Determinants of Health   Financial Resource Strain: Not on file  Food Insecurity: Not on file  Transportation Needs: Not on file  Physical Activity: Not on file  Stress: Not on file  Social Connections: Not on file  Intimate Partner Violence: Not on file   Health Maintenance  Topic Date Due   Hepatitis C Screening  Never done   COVID-19 Vaccine (3 - Pfizer risk series) 06/12/2020   COLONOSCOPY (Pts 45-75yr Insurance coverage will need to be confirmed)  10/30/2021   INFLUENZA VACCINE  05/03/2022   Zoster Vaccines- Shingrix (1 of 2) 08/26/2022 (Originally [birth year]/03/1980)   TETANUS/TDAP  05/27/2023 (Originally 12/02/2019)   HIV Screening  05/27/2023 (Originally [birth year]/1976)   PAP SMEAR-Modifier  09/16/2023   MAMMOGRAM  04/25/2024   HPV VACCINES  Aged Out    The following portions of the patient's history were reviewed and updated as appropriate: She  has a past medical history of Anxiety, Breast lump, 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 She does not have any pertinent problems on file. She  has a past surgical history that includes Cholecystectomy (1999); Tonsillectomy (1969); Exploratory laparotomy; Wisdom tooth extraction; and Colonoscopy (10/31/11). Her family history includes Alcohol abuse in her brother and father; Arthritis in her mother; Cancer in her brother and maternal aunt; Diabetes in her brother, mother, and sister; Heart disease in her father and sister; High blood pressure in her father; Hyperlipidemia in her father and sister; Hypertension in her brother, mother, and sister; Sleep apnea in her father; Uterine cancer in her mother. She  reports that she quit smoking about 31 years ago. Her smoking use included cigarettes. She has been exposed to tobacco smoke. She has never used smokeless tobacco. She reports current alcohol use of about 3.0 standard drinks of alcohol per week. She reports that she does not use drugs. She has a current medication list which includes the following prescription(s): amlodipine, cequa, diclofenac sodium, doxylamine succinate (sleep), eysuvis, golimumab, hydrochlorothiazide, ibuprofen, melatonin, metronidazole, multiple vitamin, intrarosa, and sertraline. Current Outpatient Medications on File Prior to Visit  Medication Sig Dispense Refill   amLODipine (NORVASC) 5 MG tablet Take 1 tablet (5 mg total)  by mouth daily. 90 tablet 3   cycloSPORINE, PF, (CEQUA) 0.09 % SOLN Place 1 drop into both eyes 2 (two) times daily. 60 each 2   diclofenac Sodium (VOLTAREN) 1 % GEL Apply 2-4 grams to affected area up to 4 times daily. 400 g 2   Doxylamine Succinate, Sleep, (UNISOM PO) Take 1 tablet by mouth at bedtime.     EYSUVIS 0.25 % SUSP Instill 1 drop in both eyes 4 times a day for 7  days, then 2 times a day for 7 days, then stop (Patient taking differently: Place 1 drop into both eyes as needed.) 8.3 mL 0   Golimumab 50 MG/0.5ML SOAJ INJECT 50 MG INTO THE SKIN EVERY 30 DAYS 0.5 mL 0   hydrochlorothiazide (HYDRODIURIL) 25 MG tablet Take 1 tablet (25 mg total) by mouth daily. 90 tablet 3   Ibuprofen (MOTRIN PO) Take by mouth as needed.     MELATONIN PO Take 5-10 mg by mouth at bedtime.     Multiple Vitamin (MULTIVITAMIN PO) Take by mouth daily.     Prasterone (INTRAROSA) 6.5 MG INST Insert 1 vaginal insert every day by vaginal route.     sertraline (ZOLOFT) 50 MG tablet Take 1 tablet (50 mg total) by mouth daily. 90 tablet 3   No current facility-administered medications on file prior to visit.   She is allergic to other and contrast media [iodinated contrast media]..  Review of Systems Review of Systems  Constitutional: Negative for activity change, appetite change and fatigue.  HENT: Negative for hearing loss, congestion, tinnitus and ear discharge.  dentist q5mEyes: Negative for visual disturbance (see optho q1y -- vision corrected to 20/20 with glasses).  Respiratory: Negative for cough, chest tightness and shortness of breath.   Cardiovascular: Negative for chest pain, palpitations and leg swelling.  Gastrointestinal: Negative for abdominal pain, diarrhea, constipation and abdominal distention.  Genitourinary: Negative for urgency, frequency, decreased urine volume and difficulty urinating.  Musculoskeletal: Negative for back pain, arthralgias and gait problem.  Skin: Negative for color change, pallor and rash.  Neurological: Negative for dizziness, light-headedness, numbness and headaches.  Hematological: Negative for adenopathy. Does not bruise/bleed easily.  Psychiatric/Behavioral: Negative for suicidal ideas, confusion, sleep disturbance, self-injury, dysphoric mood, decreased concentration and agitation.     Objective:    BP 126/80 (BP Location: Right  Arm, Patient Position: Sitting, Cuff Size: Normal)   Pulse (!) 55   Temp 97.6 F (36.4 C) (Oral)   Resp 18   Ht '5\' 6"'$  (1.676 m)   Wt 184 lb 9.6 oz (83.7 kg)   LMP 10/03/2005   SpO2 97%   BMI 29.80 kg/m  General appearance: alert, cooperative, appears stated age, and no distress Head: Normocephalic, without obvious abnormality, atraumatic Eyes: conjunctivae/corneas clear. PERRL, EOM's intact. Fundi benign. Ears: normal TM's and external ear canals both ears Nose: Nares normal. Septum midline. Mucosa normal. No drainage or sinus tenderness. Throat: lips, mucosa, and tongue normal; teeth and gums normal Neck: no adenopathy, no carotid bruit, no JVD, supple, symmetrical, trachea midline, and thyroid not enlarged, symmetric, no tenderness/mass/nodules Lungs: clear to auscultation bilaterally Heart: regular rate and rhythm, S1, S2 normal, no murmur, click, rub or gallop Abdomen: soft, non-tender; bowel sounds normal; no masses,  no organomegaly Extremities: extremities normal, atraumatic, no cyanosis or edema Pulses: 2+ and symmetric Skin: Skin color, texture, turgor normal. No rashes or lesions Lymph nodes: Cervical, supraclavicular, and axillary nodes normal. Neurologic: Alert and oriented X 3, normal strength and tone.  Normal symmetric reflexes. Normal coordination and gait    Assessment:    Healthy female exam.      Plan:    Ghm utd Check labs  See After Visit Summary for Counseling Recommendations   1. Abnormal EKG Pt has hx rate related bundle branch block--- but she has had some cp due to what she thoght was stress  - EKG 12-Lead - ECHOCARDIOGRAM COMPLETE; Future - Ambulatory referral to Cardiology  2. Vitamin B12 deficiency Check labs   3. Vitamin D deficiency Check labs   4. Essential hypertension Well controlled, no changes to meds. Encouraged heart healthy diet such as the DASH diet and exercise as tolerated.    5. Other hyperlipidemia Encourage heart healthy  diet such as MIND or DASH diet, increase exercise, avoid trans fats, simple carbohydrates and processed foods, consider a krill or fish or flaxseed oil cap daily.    6. Preventative health care Ghm utd Check labs   7. Perioral dermatitis  - metroNIDAZOLE (METROGEL) 1 % gel; Apply topically daily as directed by provider  Dispense: 60 g; Refill: 0  8. Colon cancer screening  - Ambulatory referral to Gastroenterology  9. Need for hepatitis C screening test   10. Rheumatoid arthritis involving multiple sites with positive rheumatoid factor ([place])   11. Primary hypertension Well controlled, no changes to meds. Encouraged heart healthy diet such as the DASH diet and exercise as tolerated.    12. Hypertension, unspecified type

## 2022-05-26 NOTE — Assessment & Plan Note (Signed)
Encourage heart healthy diet such as MIND or DASH diet, increase exercise, avoid trans fats, simple carbohydrates and processed foods, consider a krill or fish or flaxseed oil cap daily.  °

## 2022-05-26 NOTE — Assessment & Plan Note (Signed)
Per rheum 

## 2022-05-26 NOTE — Assessment & Plan Note (Signed)
metrogel  rto prn

## 2022-05-27 ENCOUNTER — Other Ambulatory Visit (HOSPITAL_BASED_OUTPATIENT_CLINIC_OR_DEPARTMENT_OTHER): Payer: Self-pay

## 2022-05-30 ENCOUNTER — Encounter (INDEPENDENT_AMBULATORY_CARE_PROVIDER_SITE_OTHER): Payer: Self-pay | Admitting: Family Medicine

## 2022-05-30 ENCOUNTER — Ambulatory Visit (INDEPENDENT_AMBULATORY_CARE_PROVIDER_SITE_OTHER): Payer: 59 | Admitting: Family Medicine

## 2022-05-30 VITALS — BP 137/67 | HR 48 | Temp 97.6°F | Ht 66.0 in | Wt 182.0 lb

## 2022-05-30 DIAGNOSIS — E559 Vitamin D deficiency, unspecified: Secondary | ICD-10-CM | POA: Diagnosis not present

## 2022-05-30 DIAGNOSIS — Z6829 Body mass index (BMI) 29.0-29.9, adult: Secondary | ICD-10-CM | POA: Diagnosis not present

## 2022-05-30 DIAGNOSIS — Z9189 Other specified personal risk factors, not elsewhere classified: Secondary | ICD-10-CM

## 2022-05-30 DIAGNOSIS — R7303 Prediabetes: Secondary | ICD-10-CM

## 2022-05-30 DIAGNOSIS — E7849 Other hyperlipidemia: Secondary | ICD-10-CM

## 2022-05-30 DIAGNOSIS — E538 Deficiency of other specified B group vitamins: Secondary | ICD-10-CM

## 2022-05-30 DIAGNOSIS — E669 Obesity, unspecified: Secondary | ICD-10-CM

## 2022-05-31 ENCOUNTER — Telehealth: Payer: Self-pay

## 2022-05-31 DIAGNOSIS — E782 Mixed hyperlipidemia: Secondary | ICD-10-CM

## 2022-05-31 LAB — LIPID PANEL
Chol/HDL Ratio: 2.4 ratio (ref 0.0–4.4)
Cholesterol, Total: 232 mg/dL — ABNORMAL HIGH (ref 100–199)
HDL: 97 mg/dL (ref >39)
LDL Chol Calc (NIH): 118 mg/dL — ABNORMAL HIGH (ref 0–99)
Triglycerides: 102 mg/dL (ref 0–149)
VLDL Cholesterol Cal: 17 mg/dL (ref 5–40)

## 2022-05-31 LAB — VITAMIN B12: Vitamin B-12: 1042 pg/mL (ref 232–1245)

## 2022-05-31 LAB — VITAMIN D 25 HYDROXY (VIT D DEFICIENCY, FRACTURES): Vit D, 25-Hydroxy: 36 ng/mL (ref 30.0–100.0)

## 2022-05-31 LAB — HEMOGLOBIN A1C
Est. average glucose Bld gHb Est-mCnc: 120 mg/dL
Hgb A1c MFr Bld: 5.8 % — ABNORMAL HIGH (ref 4.8–5.6)

## 2022-05-31 LAB — INSULIN, RANDOM: INSULIN: 3 u[IU]/mL (ref 2.6–24.9)

## 2022-05-31 NOTE — Telephone Encounter (Signed)
Message from Dr Acie Fredrickson 05/31/22: Sharon Hunter had some lipids drawn recently at her primary medical doctors office but I would like to add Lipoprotein (a) and Apo B. she is coming for an echo on Sept 8.  will you order those labs to be drawn on 9/8.   she is coming for a consult on 9/11 for new LBBB / hyperlipidemia . thanks   Order placed at this time for labs, scheduled for 06/10/22. Message sent to patient via [place].

## 2022-06-02 ENCOUNTER — Other Ambulatory Visit (HOSPITAL_COMMUNITY): Payer: Self-pay

## 2022-06-07 ENCOUNTER — Other Ambulatory Visit (HOSPITAL_COMMUNITY): Payer: Self-pay

## 2022-06-08 NOTE — Progress Notes (Signed)
Chief Complaint:   OBESITY Sharon Hunter is here to discuss her progress with her obesity treatment plan along with follow-up of her obesity related diagnoses. Sharon Hunter is on the Category 1 Plan and GOLO and states she is following her eating plan approximately 50% of the time. Sharon Hunter states she is walking 30 minutes 2 times per week.  Today's visit was #: 5 Starting weight: 191 lbs Starting date: 02/22/2022 Today's weight: 182 lbs Today's date: 05/30/2022 Total lbs lost to date: 9 Total lbs lost since last in-office visit: 1  Interim History: Sharon Hunter recently came back from vacation with the ladies, and she still lost weight, which is great!  Subjective:   1. Prediabetes Sharon Hunter denies hunger and cravings.  2. Vitamin B12 deficiency Her B12 level 3 months ago was 252. Pt is on OTC B12 2500 mcg daily.  3. Other hyperlipidemia Pt's last LDL was 145, and she is not on medication.  4. Vitamin D deficiency Her Vit D level 3 months ago was 31.0. medication: OTC Vit D 500 IU daily.  5. At risk for impaired metabolic function Sharon Hunter is at increased risk for impaired metabolic function due to current nutrition and muscle mass.  Assessment/Plan:   Orders Placed This Encounter  Procedures   VITAMIN D 25 Hydroxy (Vit-D Deficiency, Fractures)   Vitamin B12   Lipid panel   Hemoglobin A1c   Insulin, random    There are no discontinued medications.   No orders of the defined types were placed in this encounter.    1. Prediabetes Sharon Hunter will continue to work on weight loss, exercise, and decreasing simple carbohydrates to help decrease the risk of diabetes.   Lab/Orders today: - Hemoglobin A1c - Insulin, random  2. Vitamin B12 deficiency The diagnosis was reviewed with the patient. Counseling provided today, see below. We will continue to monitor. Orders and follow up as documented in patient record.  Counseling The body needs vitamin B12: to make red blood cells; to make DNA; and to help the  nerves work properly so they can carry messages from the brain to the body.  The main causes of vitamin B12 deficiency include dietary deficiency, digestive diseases, pernicious anemia, and having a surgery in which part of the stomach or small intestine is removed.  Certain medicines can make it harder for the body to absorb vitamin B12. These medicines include: heartburn medications; some antibiotics; some medications used to treat diabetes, gout, and high cholesterol.  In some cases, there are no symptoms of this condition. If the condition leads to anemia or nerve damage, various symptoms can occur, such as weakness or fatigue, shortness of breath, and numbness or tingling in your hands and feet.   Treatment:  May include taking vitamin B12 supplements.  Avoid alcohol.  Eat lots of healthy foods that contain vitamin B12: Beef, pork, chicken, [place], and organ meats, such as liver.  Seafood: This includes clams, rainbow trout, salmon, tuna, and haddock. Eggs.  Cereal and dairy products that are fortified: This means that vitamin B12 has been added to the food.   Lab/Orders today: - Vitamin B12  3. Other hyperlipidemia Cardiovascular risk and specific lipid/LDL goals reviewed.  We discussed several lifestyle modifications today and Sharon Hunter will continue to work on diet, exercise and weight loss efforts. Orders and follow up as documented in patient record.   Counseling Intensive lifestyle modifications are the first line treatment for this issue. Dietary changes: Increase soluble fiber. Decrease simple carbohydrates. Exercise changes:  Moderate to vigorous-intensity aerobic activity 150 minutes per week if tolerated. Lipid-lowering medications: see documented in medical record.  Lab/Orders today: - Lipid panel  4. Vitamin D deficiency Low Vitamin D level contributes to fatigue and are associated with obesity, breast, and colon cancer. She agrees to continue to take prescription Vitamin D  5000 IU daily and will follow-up for routine testing of Vitamin D, at least 2-3 times per year to avoid over-replacement.  Lab/Orders today: - VITAMIN D 25 Hydroxy (Vit-D Deficiency, Fractures)  5. At risk for impaired metabolic function Due to [facility] current state of health and medical condition(s), she is at a significantly higher risk for impaired metabolic function.   At least 9 minutes was spent on counseling Sharon Hunter about these concerns today.  This places the patient at a much greater risk to subsequently develop cardio-pulmonary conditions that can negatively affect the patient's quality of life.  I stressed the importance of reversing these risks factors.  The initial goal is to lose at least 5-10% of starting weight to help reduce risk factors.  Counseling:  Intensive lifestyle modifications discussed with [name] as the most appropriate first line treatment.  she will continue to work on diet, exercise, and weight loss efforts.  We will continue to reassess these conditions on a fairly regular basis in an attempt to decrease the patient's overall morbidity and mortality.  6. Obesity, Current BMI 29.4 Sharon Hunter is currently in the action stage of change. As such, her goal is to continue with weight loss efforts. She has agreed to change to keeping a food journal and adhering to recommended goals of [id number] calories and 80+ grams protein.   Bring journaling log to next OV.  Exercise goals:  Increase activity as tolerated.  Behavioral modification strategies: avoiding temptations and planning for success.  Sharon Hunter has agreed to follow-up with our clinic in 4 weeks. She was informed of the importance of frequent follow-up visits to maximize her success with intensive lifestyle modifications for her multiple health conditions.   Sharon Hunter was informed we would discuss her lab results at her next visit unless there is a critical issue that needs to be addressed sooner. Sharon Hunter agreed to keep her next visit at the  agreed upon time to discuss these results.  Objective:   Blood pressure 137/67, pulse (!) 48, temperature 97.6 F (36.4 C), height '5\' 6"'$  (1.676 m), weight 182 lb (82.6 kg), last menstrual period 10/03/2005, SpO2 100 %. Body mass index is 29.38 kg/m.  General: Cooperative, alert, well developed, in no acute distress. HEENT: Conjunctivae and lids unremarkable. Cardiovascular: Regular rhythm.  Lungs: Normal work of breathing. Neurologic: No focal deficits.   Lab Results  Component Value Date   CREATININE 0.74 02/22/2022   BUN 14 02/22/2022   NA 139 02/22/2022   K 3.9 02/22/2022   CL 101 02/22/2022   CO2 26 02/22/2022   Lab Results  Component Value Date   ALT 25 02/22/2022   AST 11 02/22/2022   ALKPHOS 81 02/22/2022   BILITOT 0.6 02/22/2022   Lab Results  Component Value Date   HGBA1C 5.8 (H) 05/30/2022   HGBA1C 5.7 (H) 02/22/2022   Lab Results  Component Value Date   INSULIN 3.0 05/30/2022   INSULIN 11.3 02/22/2022   Lab Results  Component Value Date   TSH 0.753 02/22/2022   Lab Results  Component Value Date   CHOL 232 (H) 05/30/2022   HDL 97 05/30/2022   LDLCALC 118 (H) 05/30/2022   LDLDIRECT  132.6 05/23/2013   TRIG 102 05/30/2022   CHOLHDL 2.4 05/30/2022   Lab Results  Component Value Date   VD25OH 36.0 05/30/2022   VD25OH 31.0 02/22/2022   VD25OH 32 01/30/2018   Lab Results  Component Value Date   WBC 7.5 02/22/2022   HGB 13.8 02/22/2022   HCT 42.3 02/22/2022   MCV 93 02/22/2022   PLT 289 02/22/2022   Attestation Statements:   Reviewed by clinician on day of visit: allergies, medications, problem list, medical history, surgical history, family history, social history, and previous encounter notes.  I, Kathlene November, BS, CMA, am acting as transcriptionist for [employer], DO.  I have reviewed the above documentation for accuracy and completeness, and I agree with the above. Marjory Sneddon, D.O.  The [place] was signed  into law in 2016 which includes the topic of electronic health records.  This provides immediate access to information in MyChart.  This includes consultation notes, operative notes, office notes, lab results and pathology reports.  If you have any questions about what you read please let us know at your next visit so we can discuss your concerns and take corrective action if need be.  We are right here with you.

## 2022-06-09 ENCOUNTER — Encounter: Payer: Self-pay | Admitting: Cardiovascular Disease

## 2022-06-09 NOTE — Progress Notes (Signed)
   Pt canceled appt due to COVID  PN  This encounter was created in error - please disregard.

## 2022-06-10 ENCOUNTER — Ambulatory Visit (HOSPITAL_COMMUNITY): Payer: 59 | Attending: Family Medicine

## 2022-06-10 ENCOUNTER — Ambulatory Visit: Payer: 59

## 2022-06-10 DIAGNOSIS — E782 Mixed hyperlipidemia: Secondary | ICD-10-CM | POA: Diagnosis not present

## 2022-06-10 DIAGNOSIS — R9431 Abnormal electrocardiogram [ECG] [EKG]: Secondary | ICD-10-CM | POA: Diagnosis not present

## 2022-06-10 LAB — ECHOCARDIOGRAM COMPLETE
Area-P 1/2: 3.51 cm2
MV M vel: 5.93 m/s
MV Peak grad: 140.7 mmHg
Radius: 0.4 cm
S' Lateral: 2.7 cm

## 2022-06-11 ENCOUNTER — Other Ambulatory Visit: Payer: Self-pay | Admitting: Family Medicine

## 2022-06-11 LAB — LIPOPROTEIN A (LPA): Lipoprotein (a): 12 nmol/L (ref <75.0)

## 2022-06-11 LAB — APOLIPOPROTEIN B: Apolipoprotein B: 83 mg/dL (ref <90)

## 2022-06-13 ENCOUNTER — Encounter: Payer: 59 | Admitting: Cardiovascular Disease

## 2022-06-13 ENCOUNTER — Other Ambulatory Visit (HOSPITAL_COMMUNITY): Payer: Self-pay

## 2022-06-14 ENCOUNTER — Encounter: Payer: Self-pay | Admitting: Family Medicine

## 2022-06-15 ENCOUNTER — Other Ambulatory Visit (HOSPITAL_BASED_OUTPATIENT_CLINIC_OR_DEPARTMENT_OTHER): Payer: Self-pay

## 2022-06-15 ENCOUNTER — Telehealth: Payer: 59 | Admitting: Physician Assistant

## 2022-06-15 DIAGNOSIS — U071 COVID-19: Secondary | ICD-10-CM

## 2022-06-15 DIAGNOSIS — J310 Chronic rhinitis: Secondary | ICD-10-CM | POA: Diagnosis not present

## 2022-06-15 DIAGNOSIS — J329 Chronic sinusitis, unspecified: Secondary | ICD-10-CM | POA: Diagnosis not present

## 2022-06-15 MED ORDER — IPRATROPIUM BROMIDE 0.03 % NA SOLN
2.0000 | Freq: Two times a day (BID) | NASAL | 0 refills | Status: DC
  Filled 2022-06-15: qty 30, 30d supply, fill #0

## 2022-06-15 NOTE — Patient Instructions (Signed)
Sharon Hunter, thank you for joining [name], PA-C for today's virtual visit.  While this provider is not your primary care provider (PCP), if your PCP is located in our provider database this encounter information will be shared with them immediately following your visit.  Consent: (Patient) Sharon Hunter provided verbal consent for this virtual visit at the beginning of the encounter.  Current Medications:  Current Outpatient Medications:    amLODipine (NORVASC) 5 MG tablet, Take 1 tablet (5 mg total) by mouth daily., Disp: 90 tablet, Rfl: 3   cycloSPORINE, PF, (CEQUA) 0.09 % SOLN, Place 1 drop into both eyes 2 (two) times daily., Disp: 60 each, Rfl: 2   diclofenac Sodium (VOLTAREN) 1 % GEL, Apply 2-4 grams to affected area up to 4 times daily., Disp: 400 g, Rfl: 2   Doxylamine Succinate, Sleep, (UNISOM PO), Take 1 tablet by mouth at bedtime., Disp: , Rfl:    EYSUVIS 0.25 % SUSP, Instill 1 drop in both eyes 4 times a day for 7 days, then 2 times a day for 7 days, then stop (Patient taking differently: Place 1 drop into both eyes as needed.), Disp: 8.3 mL, Rfl: 0   Golimumab 50 MG/0.5ML SOAJ, INJECT 50 MG INTO THE SKIN EVERY 30 DAYS, Disp: 0.5 mL, Rfl: 0   hydrochlorothiazide (HYDRODIURIL) 25 MG tablet, Take 1 tablet (25 mg total) by mouth daily., Disp: 90 tablet, Rfl: 3   Ibuprofen (MOTRIN PO), Take by mouth as needed., Disp: , Rfl:    MELATONIN PO, Take 5-10 mg by mouth at bedtime., Disp: , Rfl:    metroNIDAZOLE (METROGEL) 1 % gel, Apply topically daily as directed by provider, Disp: 60 g, Rfl: 0   Multiple Vitamin (MULTIVITAMIN PO), Take by mouth daily., Disp: , Rfl:    Prasterone (INTRAROSA) 6.5 MG INST, Insert 1 vaginal insert every day by vaginal route., Disp: , Rfl:    sertraline (ZOLOFT) 50 MG tablet, Take 1 tablet (50 mg total) by mouth daily., Disp: 90 tablet, Rfl: 3   Medications ordered in this encounter:  No orders of the defined types were placed in this  encounter.    *If you need refills on other medications prior to your next appointment, please contact your pharmacy*  Follow-Up: Call back or seek an in-person evaluation if the symptoms worsen or if the condition fails to improve as anticipated.  Other Instructions Please keep well-hydrated and get plenty of rest. Start a saline nasal rinse to flush out your nasal passages. You can use plain Mucinex to help thin congestion. If you have a humidifier, running in the bedroom at night. I want you to start OTC vitamin D3 1000 units daily, vitamin C 1000 mg daily, and a zinc supplement. Please take prescribed medications as directed.  You have been enrolled in a MyChart symptom monitoring program. Please answer these questions daily so we can keep track of how you are doing.  You were to quarantine for 5 days from onset of your symptoms.  After day 5, if you have had no fever and you are feeling better, you can end quarantine but need to mask for an additional 5 days. After day 5 if you have a fever or are having significant symptoms, please quarantine for full 10 days.  If you note any worsening of symptoms, any significant shortness of breath or any chest pain, please seek ER evaluation ASAP.  Please do not delay care!  COVID-19: What to Do if You Are  Sick If you test positive and are an older adult or someone who is at high risk of getting very sick from COVID-19, treatment may be available. Contact a healthcare provider right away after a positive test to determine if you are eligible, even if your symptoms are mild right now. You can also visit a Test to Treat location and, if eligible, receive a prescription from a provider. Don't delay: Treatment must be started within the first few days to be effective. If you have a fever, cough, or other symptoms, you might have COVID-19. Most people have mild illness and are able to recover at home. If you are sick: Keep track of your symptoms. If  you have an emergency warning sign (including trouble breathing), call 911. Steps to help prevent the spread of COVID-19 if you are sick If you are sick with COVID-19 or think you might have COVID-19, follow the steps below to care for yourself and to help protect other people in your home and community. Stay home except to get medical care Stay home. Most people with COVID-19 have mild illness and can recover at home without medical care. Do not leave your home, except to get medical care. Do not visit public areas and do not go to places where you are unable to wear a mask. Take care of yourself. Get rest and stay hydrated. Take over-the-counter medicines, such as acetaminophen, to help you feel better. Stay in touch with your doctor. Call before you get medical care. Be sure to get care if you have trouble breathing, or have any other emergency warning signs, or if you think it is an emergency. Avoid public transportation, ride-sharing, or taxis if possible. Get tested If you have symptoms of COVID-19, get tested. While waiting for test results, stay away from others, including staying apart from those living in your household. Get tested as soon as possible after your symptoms start. Treatments may be available for people with COVID-19 who are at risk for becoming very sick. Don't delay: Treatment must be started early to be effective--some treatments must begin within 5 days of your first symptoms. Contact your healthcare provider right away if your test result is positive to determine if you are eligible. Self-tests are one of several options for testing for the virus that causes COVID-19 and may be more convenient than laboratory-based tests and point-of-care tests. Ask your healthcare provider or your local health department if you need help interpreting your test results. You can visit your state, tribal, local, and territorial health department's website to look for the latest local information  on testing sites. Separate yourself from other people As much as possible, stay in a specific room and away from other people and pets in your home. If possible, you should use a separate bathroom. If you need to be around other people or animals in or outside of the home, wear a well-fitting mask. Tell your close contacts that they may have been exposed to COVID-19. An infected person can spread COVID-19 starting 48 hours (or 2 days) before the person has any symptoms or tests positive. By letting your close contacts know they may have been exposed to COVID-19, you are helping to protect everyone. See COVID-19 and Animals if you have questions about pets. If you are diagnosed with COVID-19, someone from the health department may call you. Answer the call to slow the spread. Monitor your symptoms Symptoms of COVID-19 include fever, cough, or other symptoms. Follow care instructions from your  healthcare provider and local health department. Your local health authorities may give instructions on checking your symptoms and reporting information. When to seek emergency medical attention Look for emergency warning signs* for COVID-19. If someone is showing any of these signs, seek emergency medical care immediately: Trouble breathing Persistent pain or pressure in the chest New confusion Inability to wake or stay awake Pale, gray, or blue-colored skin, lips, or nail beds, depending on skin tone *This list is not all possible symptoms. Please call your medical provider for any other symptoms that are severe or concerning to you. Call 911 or call ahead to your local emergency facility: Notify the operator that you are seeking care for someone who has or may have COVID-19. Call ahead before visiting your doctor Call ahead. Many medical visits for routine care are being postponed or done by phone or telemedicine. If you have a medical appointment that cannot be postponed, call your doctor's office, and  tell them you have or may have COVID-19. This will help the office protect themselves and other patients. If you are sick, wear a well-fitting mask You should wear a mask if you must be around other people or animals, including pets (even at home). Wear a mask with the best fit, protection, and comfort for you. You don't need to wear the mask if you are alone. If you can't put on a mask (because of trouble breathing, for example), cover your coughs and sneezes in some other way. Try to stay at least 6 feet away from other people. This will help protect the people around you. Masks should not be placed on young children under age 55 years, anyone who has trouble breathing, or anyone who is not able to remove the mask without help. Cover your coughs and sneezes Cover your mouth and nose with a tissue when you cough or sneeze. Throw away used tissues in a lined trash can. Immediately wash your hands with soap and water for at least 20 seconds. If soap and water are not available, clean your hands with an alcohol-based hand sanitizer that contains at least 60% alcohol. Clean your hands often Wash your hands often with soap and water for at least 20 seconds. This is especially important after blowing your nose, coughing, or sneezing; going to the bathroom; and before eating or preparing food. Use hand sanitizer if soap and water are not available. Use an alcohol-based hand sanitizer with at least 60% alcohol, covering all surfaces of your hands and rubbing them together until they feel dry. Soap and water are the best option, especially if hands are visibly dirty. Avoid touching your eyes, nose, and mouth with unwashed hands. Handwashing Tips Avoid sharing personal household items Do not share dishes, drinking glasses, cups, eating utensils, towels, or bedding with other people in your home. Wash these items thoroughly after using them with soap and water or put in the dishwasher. Clean surfaces in your  home regularly Clean and disinfect high-touch surfaces (for example, doorknobs, tables, handles, light switches, and countertops) in your "sick room" and bathroom. In shared spaces, you should clean and disinfect surfaces and items after each use by the person who is ill. If you are sick and cannot clean, a caregiver or other person should only clean and disinfect the area around you (such as your bedroom and bathroom) on an as needed basis. Your caregiver/other person should wait as long as possible (at least several hours) and wear a mask before entering, cleaning, and disinfecting  shared spaces that you use. Clean and disinfect areas that may have blood, stool, or body fluids on them. Use household cleaners and disinfectants. Clean visible dirty surfaces with household cleaners containing soap or detergent. Then, use a household disinfectant. Use a product from [employer] List N: Disinfectants for Coronavirus (NOMVE-72). Be sure to follow the instructions on the label to ensure safe and effective use of the product. Many products recommend keeping the surface wet with a disinfectant for a certain period of time (look at "contact time" on the product label). You may also need to wear personal protective equipment, such as gloves, depending on the directions on the product label. Immediately after disinfecting, wash your hands with soap and water for 20 seconds. For completed guidance on cleaning and disinfecting your home, visit Complete Disinfection Guidance. Take steps to improve ventilation at home Improve ventilation (air flow) at home to help prevent from spreading COVID-19 to other people in your household. Clear out COVID-19 virus particles in the air by opening windows, using air filters, and turning on fans in your home. Use this interactive tool to learn how to improve air flow in your home. When you can be around others after being sick with COVID-19 Deciding when you can be around others is  different for different situations. Find out when you can safely end home isolation. For any additional questions about your care, contact your healthcare provider or state or local health department. 12/22/2020 Content source: [facility] for Immunization and Respiratory Diseases ([facility]), Division of Viral Diseases This information is not intended to replace advice given to you by your health care provider. Make sure you discuss any questions you have with your health care provider. Document Revised: 02/04/2021 Document Reviewed: 02/04/2021 Elsevier Patient Education  2022 [employer].      If you have been instructed to have an in-person evaluation today at a local Urgent Care facility, please use the link below. It will take you to a list of all of our available [facility], including address, phone number and hours of operation. Please do not delay care.  [facility]  If you or a family member do not have a primary care provider, use the link below to schedule a visit and establish care. When you choose a [facility] primary care physician or advanced practice provider, you gain a long-term partner in health. Find a Primary Care Provider  Learn more about [facility]'s in-office and virtual care options: [facility]

## 2022-06-15 NOTE — Progress Notes (Signed)
Virtual Visit Consent   Sharon Hunter, you are scheduled for a virtual visit with a [facility] provider today. Just as with appointments in the office, your consent must be obtained to participate. Your consent will be active for this visit and any virtual visit you may have with one of our providers in the next 365 days. If you have a MyChart account, a copy of this consent can be sent to you electronically.  As this is a virtual visit, video technology does not allow for your provider to perform a traditional examination. This may limit your provider's ability to fully assess your condition. If your provider identifies any concerns that need to be evaluated in person or the need to arrange testing (such as labs, EKG, etc.), we will make arrangements to do so. Although advances in technology are sophisticated, we cannot ensure that it will always work on either your end or our end. If the connection with a video visit is poor, the visit may have to be switched to a telephone visit. With either a video or telephone visit, we are not always able to ensure that we have a secure connection.  By engaging in this virtual visit, you consent to the provision of healthcare and authorize for your insurance to be billed (if applicable) for the services provided during this visit. Depending on your insurance coverage, you may receive a charge related to this service.  I need to obtain your verbal consent now. Are you willing to proceed with your visit today? Sharon Hunter has provided verbal consent on 06/15/2022 for a virtual visit (video or telephone). Sharon Hunter, [place]  Date: 06/15/2022 5:28 PM  Virtual Visit via Video Note   I, Sharon Hunter, connected with  [name]  ([id number], [birth year]) on 06/15/22 at  5:15 PM EDT by a video-enabled telemedicine application and verified that I am speaking with the correct person using two identifiers.  Location: Patient: Virtual Visit Location  Patient: Home Provider: Virtual Visit Location Provider: Home Office   I discussed the limitations of evaluation and management by telemedicine and the availability of in person appointments. The patient expressed understanding and agreed to proceed.    History of Present Illness: Sharon Hunter is a 61 y.o. who identifies as a female who was assigned female at birth, and is being seen today for some significant nasal turbinate swelling over the past couple of days.  Patient endorses starting with symptoms Sunday including fever, chills, nasal and head congestion, chest congestion and cough.  Tested positive for COVID.  After couple days, she has had no residual fever or body aches.  Feels much better which seems to continue daily, except for some continued nasal congestion and significant nasal turbinate swelling.  As such is made it harder to breathe through her nose.  Has been using hot compresses, humidifier and Motrin.  Has tried to use Flonase in the past but irritated her nose.  She did start using generic Afrin last night with some relief.    HPI: HPI  Problems:  Patient Active Problem List   Diagnosis Date Noted   At risk for impaired metabolic function 93/23/5573   Perioral dermatitis 05/26/2022   Preventative health care 05/26/2022   Essential hypertension 05/26/2022   Abnormal EKG 05/26/2022   Prediabetes 03/09/2022   Vitamin B12 deficiency 03/09/2022   Primary hypertension 02/22/2022   Hyperlipidemia 02/22/2022   GAD (generalized anxiety disorder) 02/22/2022   Vitamin D deficiency 02/22/2022  Ear pain, left 08/13/2021   History of anxiety 03/27/2017   History of hypertension 03/27/2017   History of bundle branch block 03/27/2017   Primary osteoarthritis of both hands 12/22/2016   History of shingles 08/02/2016   High risk medication use 08/02/2016   Hypertension 08/01/2016   IBS (irritable bowel syndrome) 08/01/2016   Anxiety 08/01/2016   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   Contrast Media [Iodinated Contrast Media]    Medications:  Current Outpatient Medications:    ipratropium (ATROVENT) 0.03 % nasal spray, Place 2 sprays into both nostrils every 12 (twelve) hours., Disp: 30 mL, Rfl: 0   amLODipine (NORVASC) 5 MG tablet, Take 1 tablet (5 mg total) by mouth daily., Disp: 90 tablet, Rfl: 3   cycloSPORINE, PF, (CEQUA) 0.09 % SOLN, Place 1 drop into both eyes 2 (two) times daily., Disp: 60 each, Rfl: 2   diclofenac Sodium (VOLTAREN) 1 % GEL, Apply 2-4 grams to affected area up to 4 times daily., Disp: 400 g, Rfl: 2   Doxylamine Succinate, Sleep, (UNISOM PO), Take 1 tablet by mouth at bedtime., Disp: , Rfl:    EYSUVIS 0.25 % SUSP, Instill 1 drop in both eyes 4 times a day for 7 days, then 2 times a day for 7 days, then stop (Patient taking differently: Place 1 drop into both eyes as needed.), Disp: 8.3 mL, Rfl: 0   Golimumab 50 MG/0.5ML SOAJ, INJECT 50 MG INTO THE SKIN EVERY 30 DAYS, Disp: 0.5 mL, Rfl: 0   hydrochlorothiazide (HYDRODIURIL) 25 MG tablet, Take 1 tablet (25 mg total) by mouth daily., Disp: 90 tablet, Rfl: 3   Ibuprofen (MOTRIN PO), Take by mouth as needed., Disp: , Rfl:    MELATONIN PO, Take 5-10 mg by mouth at bedtime., Disp: , Rfl:    metroNIDAZOLE (METROGEL) 1 % gel, Apply topically daily as directed by provider, Disp: 60 g, Rfl: 0   Multiple Vitamin (MULTIVITAMIN PO), Take by mouth daily., Disp: , Rfl:    Prasterone (INTRAROSA) 6.5 MG INST, Insert 1 vaginal insert every day by vaginal route., Disp: , Rfl:    sertraline (ZOLOFT) 50 MG tablet, Take 1 tablet (50 mg total) by mouth daily., Disp: 90 tablet, Rfl:  3  Observations/Objective: Patient is well-developed, well-nourished in no acute distress.  Resting comfortably at home.  Head is normocephalic, atraumatic.  No labored breathing. Speech is clear and coherent with logical content.  Patient is alert and oriented at baseline.   Assessment and Plan: 1. COVID-19 - ipratropium (ATROVENT) 0.03 % nasal spray; Place 2 sprays into both nostrils every 12 (twelve) hours.  Dispense: 30 mL; Refill: 0 - MyChart COVID-19 home monitoring program; Future  2. Rhinosinusitis - ipratropium (ATROVENT) 0.03 % nasal spray; Place 2 sprays into both nostrils every 12 (twelve) hours.  Dispense: 30 mL; Refill: 0  Majority of her COVID symptoms seem to be improving significantly, several of which have resolved.  Continued nasal congestion and mucosal swelling.  Again reviewed supportive measures, OTC medications and vitamin recommendations with patient.  Discussed antiviral but patient declines at present.  Think that is reasonable given the majority of her symptoms are improved,  although there is a small subset of patients who have worsening symptoms after initial improvement.  We will start her on a trial of ipratropium nasal spray.  She has a prescription for Astelin nasal spray as well that she is to resume as directed.  Quarantine reviewed with patient.  Patient enrolled in a MyChart symptom monitoring program.  Follow Up Instructions: I discussed the assessment and treatment plan with the patient. The patient was provided an opportunity to ask questions and all were answered. The patient agreed with the plan and demonstrated an understanding of the instructions.  A copy of instructions were sent to the patient via MyChart unless otherwise noted below.   The patient was advised to call back or seek an in-person evaluation if the symptoms worsen or if the condition fails to improve as anticipated.  Time:  I spent 10 minutes with the patient via telehealth  technology discussing the above problems/concerns.    Sharon Rio, PA-C

## 2022-06-20 ENCOUNTER — Other Ambulatory Visit (HOSPITAL_COMMUNITY): Payer: Self-pay

## 2022-06-20 ENCOUNTER — Other Ambulatory Visit: Payer: Self-pay | Admitting: Physician Assistant

## 2022-06-20 DIAGNOSIS — M0579 Rheumatoid arthritis with rheumatoid factor of multiple sites without organ or systems involvement: Secondary | ICD-10-CM

## 2022-06-20 MED ORDER — SIMPONI 50 MG/0.5ML ~~LOC~~ SOAJ
SUBCUTANEOUS | 0 refills | Status: DC
  Filled 2022-06-20: qty 0.5, 28d supply, fill #0

## 2022-06-20 NOTE — Telephone Encounter (Signed)
Next Visit: 10/11/2022  Last Visit: 05/02/2022  Last Fill: 05/02/2022 (30 day supply)  HC:[id number] arthritis involving multiple sites with positive rheumatoid factor   Current Dose per office note 05/02/2022: Simponi 50 mg sq injections every 28 days.  Labs: 02/22/2022 Glucose 110  TB Gold: 07/27/2021 Neg    Okay to refill Simponi?

## 2022-06-21 ENCOUNTER — Other Ambulatory Visit (HOSPITAL_COMMUNITY): Payer: Self-pay

## 2022-06-29 ENCOUNTER — Encounter: Payer: Self-pay | Admitting: Family Medicine

## 2022-06-29 ENCOUNTER — Encounter (INDEPENDENT_AMBULATORY_CARE_PROVIDER_SITE_OTHER): Payer: Self-pay | Admitting: Family Medicine

## 2022-06-29 ENCOUNTER — Ambulatory Visit (INDEPENDENT_AMBULATORY_CARE_PROVIDER_SITE_OTHER): Payer: 59 | Admitting: Family Medicine

## 2022-06-29 ENCOUNTER — Other Ambulatory Visit (HOSPITAL_COMMUNITY): Payer: Self-pay

## 2022-06-29 VITALS — BP 119/76 | HR 56 | Temp 97.6°F | Ht 66.0 in | Wt 178.0 lb

## 2022-06-29 DIAGNOSIS — E538 Deficiency of other specified B group vitamins: Secondary | ICD-10-CM

## 2022-06-29 DIAGNOSIS — Z6828 Body mass index (BMI) 28.0-28.9, adult: Secondary | ICD-10-CM

## 2022-06-29 DIAGNOSIS — E669 Obesity, unspecified: Secondary | ICD-10-CM

## 2022-06-29 DIAGNOSIS — E559 Vitamin D deficiency, unspecified: Secondary | ICD-10-CM

## 2022-06-29 DIAGNOSIS — E7849 Other hyperlipidemia: Secondary | ICD-10-CM

## 2022-06-29 DIAGNOSIS — Z9189 Other specified personal risk factors, not elsewhere classified: Secondary | ICD-10-CM

## 2022-06-29 DIAGNOSIS — R7303 Prediabetes: Secondary | ICD-10-CM | POA: Diagnosis not present

## 2022-06-29 MED ORDER — CHOLECALCIFEROL 125 MCG (5000 UT) PO CHEW
1.0000 | CHEWABLE_TABLET | Freq: Every day | ORAL | 0 refills | Status: AC
  Filled 2022-06-29 – 2022-11-30 (×3): qty 30, fill #0

## 2022-06-29 NOTE — Telephone Encounter (Signed)
Please advise. Pt hasn't had a Dexa scan since 12/2020.

## 2022-07-01 NOTE — Telephone Encounter (Signed)
It Prolia or Evenity covered?

## 2022-07-05 NOTE — Progress Notes (Signed)
Chief Complaint:   OBESITY Sharon Hunter is here to discuss her progress with her obesity treatment plan along with follow-up of her obesity related diagnoses. Sharon Hunter is on keeping a food journal and adhering to recommended goals of [phone] calories and 80+ grams protein and states she is following her eating plan approximately 50% of the time. Sharon Hunter states she is hiking and walking 4 hours 3 times per week.  Today's visit was #: 6 Starting weight: 191 lbs Starting date: 02/22/2022 Today's weight: 178 lbs Today's date: 06/29/2022 Total lbs lost to date: 13 Total lbs lost since last in-office visit: 4  Interim History: Sharon Hunter had COVID for a week, then recovered and has increased her exercise, and having more lengthy 4-5 mile walks 2 days and does floor exercises at home for 2 days a week.  Subjective:   1. Prediabetes Worsening. Discussed labs with patient today. An denies hunger and cravings. She still likes her wine and did more socializing over the summer and off plan eating and drinking.  2. Other hyperlipidemia Discussed labs with patient today. Sharon Hunter is not on statin therapy.  3. Vitamin D deficiency Discussed labs with patient today. Pt with osteoporosis and is working with PCP on treatment wit medication.  4. Vitamin B12 deficiency Discussed labs with patient today. Pt is taking OTC B12 daily.  5. At risk for impaired metabolic function Sharon Hunter is at risk for impaired metabolic function due to pre-diabetes and obesity.  Assessment/Plan:  No orders of the defined types were placed in this encounter.   There are no discontinued medications.   Meds ordered this encounter  Medications   Cholecalciferol 125 MCG (5000 UT) CHEW    Sig: Chew 1 tablet by mouth daily at 6 (six) AM.    Dispense:  30 tablet    Refill:  0     1. Prediabetes A1c slightly worse from 5.7 to 5.8, but not that clinically significant. Discussed with pt. Sharon Hunter will continue to work on weight loss,  exercise, and decreasing simple carbohydrates to help decrease the risk of diabetes. Counseling done.  2. Other hyperlipidemia Cardiovascular risk and specific lipid/LDL goals reviewed.  We discussed several lifestyle modifications today and Sharon Hunter will continue to work on diet, exercise and weight loss efforts. Orders and follow up as documented in patient record.  Improved LDL and HDL is still over 90 and excellent.  Counseling Intensive lifestyle modifications are the first line treatment for this issue. Dietary changes: Increase soluble fiber. Decrease simple carbohydrates. Exercise changes: Moderate to vigorous-intensity aerobic activity 150 minutes per week if tolerated. Lipid-lowering medications: see documented in medical record.  3. Vitamin D deficiency Increase Vit D supplement to OTC 5000 IU daily. Pt can only take chewable/dissolvable meds, which are not available in 50k Vit D supplement.continue weight bearing exercises. Definitive treatment per PCP.  Increase to- Cholecalciferol 125 MCG (5000 UT) CHEW; Chew 1 tablet by mouth daily at 6 (six) AM.  Dispense: 30 tablet; Refill: 0  4. Vitamin B12 deficiency B12 at goal. The diagnosis was reviewed with the patient. Counseling provided today, see below. We will continue to monitor. Orders and follow up as documented in patient record.  Continue OTC B12 [phone] mcg daily.  Counseling The body needs vitamin B12: to make red blood cells; to make DNA; and to help the nerves work properly so they can carry messages from the brain to the body.  The main causes of vitamin B12 deficiency include dietary deficiency, digestive diseases,  pernicious anemia, and having a surgery in which part of the stomach or small intestine is removed.  Certain medicines can make it harder for the body to absorb vitamin B12. These medicines include: heartburn medications; some antibiotics; some medications used to treat diabetes, gout, and high cholesterol.  In  some cases, there are no symptoms of this condition. If the condition leads to anemia or nerve damage, various symptoms can occur, such as weakness or fatigue, shortness of breath, and numbness or tingling in your hands and feet.   Treatment:  May include taking vitamin B12 supplements.  Avoid alcohol.  Eat lots of healthy foods that contain vitamin B12: Beef, pork, chicken, [place], and organ meats, such as liver.  Seafood: This includes clams, rainbow trout, salmon, tuna, and haddock. Eggs.  Cereal and dairy products that are fortified: This means that vitamin B12 has been added to the food.   5. At risk for impaired metabolic function Sharon Hunter is at a higher than average risk of impaired function of the liver.  she was given prevention education and counseling today of more than 9 minutes on this condition.  Educated her that elevated liver transaminases with an ALT predominance combined with obesity and insulin resistance is characteristic, but not diagnostic of non-alcoholic fatty liver disease (NAFLD).  I explained that this would be diagnosed by further tests to include a RUQ [place].  Explained that NAFLD is the 2nd leading cause of liver transplant in adults. Treatment includes weight loss, elimination of sweet drinks, including juice, avoidance of high fructose corn syrup, and exercise.  As always, avoiding alcohol consumption and hepatotoxic substances such as excess tylenol is also important.   6. Obesity, Current BMI 28.8 Sharon Hunter is currently in the action stage of change. As such, her goal is to continue with weight loss efforts. She has agreed to keeping a food journal and adhering to recommended goals of [phone] calories and 80+ grams protein.   Pt desires to be 165 lbs.  Exercise goals:  As is  Behavioral modification strategies: increasing lean protein intake, decreasing simple carbohydrates, and planning for success.  Sharon Hunter has agreed to follow-up with our clinic in 4 weeks. She was  informed of the importance of frequent follow-up visits to maximize her success with intensive lifestyle modifications for her multiple health conditions.   Objective:   Blood pressure 119/76, pulse (!) 56, temperature 97.6 F (36.4 C), height '5\' 6"'$  (1.676 m), weight 178 lb (80.7 kg), last menstrual period 10/03/2005, SpO2 100 %. Body mass index is 28.73 kg/m.  General: Cooperative, alert, well developed, in no acute distress. HEENT: Conjunctivae and lids unremarkable. Cardiovascular: Regular rhythm.  Lungs: Normal work of breathing. Neurologic: No focal deficits.   Lab Results  Component Value Date   CREATININE 0.74 02/22/2022   BUN 14 02/22/2022   NA 139 02/22/2022   K 3.9 02/22/2022   CL 101 02/22/2022   CO2 26 02/22/2022   Lab Results  Component Value Date   ALT 25 02/22/2022   AST 11 02/22/2022   ALKPHOS 81 02/22/2022   BILITOT 0.6 02/22/2022   Lab Results  Component Value Date   HGBA1C 5.8 (H) 05/30/2022   HGBA1C 5.7 (H) 02/22/2022   Lab Results  Component Value Date   INSULIN 3.0 05/30/2022   INSULIN 11.3 02/22/2022   Lab Results  Component Value Date   TSH 0.753 02/22/2022   Lab Results  Component Value Date   CHOL 232 (H) 05/30/2022   HDL 97  05/30/2022   LDLCALC 118 (H) 05/30/2022   LDLDIRECT 132.6 05/23/2013   TRIG 102 05/30/2022   CHOLHDL 2.4 05/30/2022   Lab Results  Component Value Date   VD25OH 36.0 05/30/2022   VD25OH 31.0 02/22/2022   VD25OH 32 01/30/2018   Lab Results  Component Value Date   WBC 7.5 02/22/2022   HGB 13.8 02/22/2022   HCT 42.3 02/22/2022   MCV 93 02/22/2022   PLT 289 02/22/2022   Attestation Statements:   Reviewed by clinician on day of visit: allergies, medications, problem list, medical history, surgical history, family history, social history, and previous encounter notes.  I, Kathlene November, BS, CMA, am acting as transcriptionist for [employer], DO.  I have reviewed the above documentation for  accuracy and completeness, and I agree with the above. Marjory Sneddon, D.O.  The [place] was signed into law in 2016 which includes the topic of electronic health records.  This provides immediate access to information in MyChart.  This includes consultation notes, operative notes, office notes, lab results and pathology reports.  If you have any questions about what you read please let us know at your next visit so we can discuss your concerns and take corrective action if need be.  We are right here with you.

## 2022-07-08 ENCOUNTER — Other Ambulatory Visit (HOSPITAL_COMMUNITY): Payer: Self-pay

## 2022-07-10 ENCOUNTER — Encounter: Payer: Self-pay | Admitting: Cardiovascular Disease

## 2022-07-10 NOTE — Progress Notes (Signed)
Cardiology Office Note:    Date:  07/15/2022   ID:  Sharon Hunter, DOB [birth year], MRN [id number]  PCP:  Sharon Hunter, Sharon Congress, DO   [facility] Providers Cardiologist:  Cleona Doubleday    Referring MD: Sharon Hunter, Sharon Hunter, *   Chief Complaint  Patient presents with   Hyperlipidemia   left bundle branch block     History of Present Illness:    Sharon Hunter is a 61 y.o. female with a hx of hyperlipidemia and LBBB, HTN   I saw Sharon Hunter several years ago for rate related LBBB  Has a hx of HTN and HLD   Echo from Sept. 8, 2023  reveals nl LV function, mild MR   Hiking some ,  walking some  Needs to get more .   Father developed CAD in his 31s Sister developed CAD in her 49 s   Has some DOE with exertion  Ie walking into work .  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   Hypertension    IBS (irritable bowel syndrome) 08/01/2016   Joint pain    Lactose intolerance    Migraine    Murmur 02/03/2011   Obese    Ovarian failure    Palpitation    Pulmonary nodule, left 2009   last CT Nov 2011-- due 08/2011   Rheumatoid arthritis(714.0)    in remission , off meds as of Summer 2012   Vitamin D deficiency     Past Surgical History:  Procedure Laterality Date   CHOLECYSTECTOMY  1999   COLONOSCOPY  10/31/11   EXPLORATORY LAPAROTOMY     TONSILLECTOMY  1969   WISDOM TOOTH EXTRACTION      Current Medications: Current Meds  Medication Sig   amLODipine (NORVASC) 5 MG tablet Take 1 tablet (5 mg total) by mouth daily.   Cholecalciferol 125 MCG (5000 UT) CHEW Chew 1 tablet by mouth daily at 6 (six) AM.   cycloSPORINE, PF, (CEQUA) 0.09 % SOLN Place 1 drop into both eyes 2 (two) times daily.   diclofenac  Sodium (VOLTAREN) 1 % GEL Apply 2-4 grams to affected area up to 4 times daily.   diphenhydrAMINE (BENADRYL) 50 MG tablet Take 1 tablet by mouth with final dose of prednisone one hour prior to scan   Doxylamine Succinate, Sleep, (UNISOM PO) Take 1 tablet by mouth at bedtime.   EYSUVIS 0.25 % SUSP Instill 1 drop in both eyes 4 times a day for 7 days, then 2 times a day for 7 days, then stop (Patient taking differently: Place 1 drop into both eyes as needed.)   Golimumab (SIMPONI) 50 MG/0.5ML SOAJ Inject 50 mg into the skin every 30 (thirty) days.   hydrochlorothiazide (HYDRODIURIL) 25 MG tablet Take 1 tablet (25 mg total) by mouth daily.   Ibuprofen (MOTRIN PO) Take by mouth as needed.   ipratropium (ATROVENT) 0.03 % nasal spray Place 2 sprays into both nostrils every 12 (twelve) hours.   MELATONIN PO Take 5-10 mg by mouth at bedtime.   metoprolol tartrate (LOPRESSOR) 50 MG tablet Take 1 tablet by mouth two hours prior to scan  metroNIDAZOLE (METROGEL) 1 % gel Apply topically daily as directed by provider   Multiple Vitamin (MULTIVITAMIN PO) Take by mouth daily.   Prasterone (INTRAROSA) 6.5 MG INST Insert 1 vaginal insert every day by vaginal route.   predniSONE (DELTASONE) 50 MG tablet Take 1 tablet by mouth 13 hours prior, 7 hours prior, and 1 hour prior to scan   sertraline (ZOLOFT) 50 MG tablet Take 1 tablet (50 mg total) by mouth daily.     Allergies:   Other and Contrast media [iodinated contrast media]   Social History   Socioeconomic History   Marital status: Single    Spouse name: Not on file   Number of children: 1   Years of education: 17   Highest education level: Not on file  Occupational History   Occupation: RN  Tobacco Use   Smoking status: Former    Types: Cigarettes    Quit date: 10/03/1990    Years since quitting: 31.8    Passive exposure: Past   Smokeless tobacco: Never  Vaping Use   Vaping Use: Never used  Substance and Sexual Activity   Alcohol use: Yes     Alcohol/week: 3.0 standard drinks of alcohol    Types: 3 Standard drinks or equivalent per week    Comment: Red wine 4 oz occ   Drug use: No   Sexual activity: Not Currently    Partners: Male  Other Topics Concern   Not on file  Social History Narrative   Exercise-- jogs   Social Determinants of Health   Financial Resource Strain: Not on file  Food Insecurity: Not on file  Transportation Needs: Not on file  Physical Activity: Not on file  Stress: Not on file  Social Connections: Not on file     Family History: The patient's family history includes Alcohol abuse in her brother and father; Arthritis in her mother; Cancer in her brother and maternal aunt; Diabetes in her brother, mother, and sister; Heart disease in her father and sister; High blood pressure in her father; Hyperlipidemia in her father and sister; Hypertension in her brother, mother, and sister; Sleep apnea in her father; Uterine cancer in her mother.  ROS:   Please see the history of present illness.     All other systems reviewed and are negative.  EKGs/Labs/Other Studies Reviewed:    The following studies were reviewed today:   EKG:  Oct. 13, 2023:   NSR at 61,  LBBB   Recent Labs: 02/22/2022: TSH 0.753 07/12/2022: ALT 22; BUN 21; Creat 0.64; Hemoglobin 13.3; Platelets 283; Potassium 3.9; Sodium 139  Recent Lipid Panel    Component Value Date/Time   CHOL 232 (H) 05/30/2022 1503   TRIG 102 05/30/2022 1503   HDL 97 05/30/2022 1503   CHOLHDL 2.4 05/30/2022 1503   CHOLHDL 3 10/29/2021 0950   VLDL 20.0 10/29/2021 0950   LDLCALC 118 (H) 05/30/2022 1503   LDLCALC 133 (H) 01/30/2018 1545   LDLDIRECT 132.6 05/23/2013 1141     Risk Assessment/Calculations:      HYPERTENSION CONTROL Vitals:   07/15/22 1505 07/15/22 1542  BP: (!) 140/78 (!) 160/90    The patient's blood pressure is elevated above target today.  In order to address the patient's elevated BP: The blood pressure is usually elevated in  clinic.  Blood pressures monitored at home have been optimal.            Physical Exam:    VS:  BP (!) 160/90  Pulse 61   Ht 5\' 6"  (1.676 m)   Wt 182 lb 9.6 oz (82.8 kg)   LMP 10/03/2005   SpO2 98%   BMI 29.47 kg/m     Wt Readings from Last 3 Encounters:  07/15/22 182 lb 9.6 oz (82.8 kg)  06/29/22 178 lb (80.7 kg)  05/30/22 182 lb (82.6 kg)     GEN: ] Well nourished, well developed in no acute distress HEENT: Normal NECK: No JVD; No carotid bruits LYMPHATICS: No lymphadenopathy CARDIAC: ]RRR, , very soft systolic murmur  RESPIRATORY:  Clear to auscultation without rales, wheezing or rhonchi  ABDOMEN: Soft, non-tender, non-distended MUSCULOSKELETAL:  No edema; No deformity  SKIN: Warm and dry NEUROLOGIC:  Alert and oriented x 3 PSYCHIATRIC:  Normal affect   ASSESSMENT:    1. Essential hypertension   2. Primary hypertension   3. LBBB (left bundle branch block)   4. Abnormal electrocardiogram    PLAN:      LBBB: Jerel Shepherd for further evaluation of her left bundle branch block.  She does have some episodes of shortness of breath with exertion.  Her echocardiogram last month shows normal left ventricular systolic function.    She has a family history of coronary artery disease at a young age.  I think we should do a coronary CT angiogram for further evaluation of her left bundle branch block and her shortness of breath with exertion.  2.  Hypertension: Blood pressure is a little high today although she is anxious and is was in hurry to get here.  I will reassess her in 3 months for follow-up visit.  She will keep a blood pressure log.  I would be inclined to stop her amlodipine and change her to valsartan and also would change her HCTZ to spironolactone.  She will continue to work on improving her low-salt diet, exercise, weight loss.  3.  Hyperlipidemia :   LDL is mildly elevated.   If she has evidence of CAD, will discuss starting a statin or PCSK9 inhibitor             Medication Adjustments/Labs and Tests Ordered: Current medicines are reviewed at length with the patient today.  Concerns regarding medicines are outlined above.  Orders Placed This Encounter  Procedures   CT CORONARY MORPH W/CTA COR W/SCORE W/CA W/CM &/OR WO/CM   EKG 12-Lead   Meds ordered this encounter  Medications   predniSONE (DELTASONE) 50 MG tablet    Sig: Take 1 tablet by mouth 13 hours prior, 7 hours prior, and 1 hour prior to scan    Dispense:  3 tablet    Refill:  0   diphenhydrAMINE (BENADRYL) 50 MG tablet    Sig: Take 1 tablet by mouth with final dose of prednisone one hour prior to scan    Dispense:  1 tablet    Refill:  0   metoprolol tartrate (LOPRESSOR) 50 MG tablet    Sig: Take 1 tablet by mouth two hours prior to scan    Dispense:  1 tablet    Refill:  0    Patient Instructions  Medication Instructions:  Your physician recommends that you continue on your current medications as directed. Please refer to the Current Medication list given to you today.  *If you need a refill on your cardiac medications before your next appointment, please call your pharmacy*   Lab Work: NONE If you have labs (blood work) drawn today and your tests are completely normal, you will  receive your results only by: MyChart Message (if you have MyChart) OR A paper copy in the mail If you have any lab test that is abnormal or we need to change your treatment, we will call you to review the results.  Testing/Procedures: Coronary CT Angiogram Your physician has requested that you have cardiac CT. Cardiac computed tomography (CT) is a painless test that uses an x-ray machine to take clear, detailed pictures of your heart. For further information please visit [email or web address] Please follow instruction sheet as given.  Follow-Up: At [facility], you and your health needs are our priority.  As part of our continuing mission to provide you with exceptional  heart care, we have created designated Provider Care Teams.  These Care Teams include your primary Cardiologist (physician) and Advanced Practice Providers (APPs -  Physician Assistants and Nurse Practitioners) who all work together to provide you with the care you need, when you need it.  Your next appointment:   3 month(s)  The format for your next appointment:   In Person  Provider:   Kristeen Miss, MD  Other Instructions   Your cardiac CT will be scheduled at:   [facility] [address] [place], [place] [address] [phone]  Please arrive at the [facility] and Children's Entrance (Entrance C2) of [facility] 30 minutes prior to test start time. You can use the FREE valet parking offered at entrance C (encouraged to control the heart rate for the test)  Proceed to the [facility] Radiology Department (first floor) to check-in and test prep.  All radiology patients and guests should use entrance C2 at [facility], accessed from [facility], even though the hospital's physical address listed is [address]    Please follow these instructions carefully (unless otherwise directed):   On the Night Before the Test: Be sure to Drink plenty of water. Do not consume any caffeinated/decaffeinated beverages or chocolate 12 hours prior to your test. Do not take any antihistamines 12 hours prior to your test. If the patient has contrast allergy: Patient will need a prescription for Prednisone and very clear instructions (as follows): Prednisone 50 mg - take 13 hours prior to test Take another Prednisone 50 mg 7 hours prior to test Take another Prednisone 50 mg 1 hour prior to test Take Benadryl 50 mg 1 hour prior to test Patient must complete all four doses of above prophylactic medications. Patient will need a ride after test due to Benadryl.  On the Day of the Test: Drink plenty of water until 1 hour prior to the test. Do  not eat any food 1 hour prior to test. You may take your regular medications prior to the test.  Take metoprolol (Lopressor) two hours prior to test. HOLD Hydrochlorothiazide morning of the test. FEMALES- please wear underwire-free bra if available, avoid dresses & tight clothing  After the Test: Drink plenty of water. After receiving IV contrast, you may experience a mild flushed feeling. This is normal. On occasion, you may experience a mild rash up to 24 hours after the test. This is not dangerous. If this occurs, you can take Benadryl 25 mg and increase your fluid intake. If you experience trouble breathing, this can be serious. If it is severe call 911 IMMEDIATELY. If it is mild, please call our office. If you take any of these medications: Glipizide/Metformin, Avandament, Glucavance, please do not take 48 hours after completing test unless otherwise instructed.  We  will call to schedule your test 2-4 weeks out understanding that some insurance companies will need an authorization prior to the service being performed.   For non-scheduling related questions, please contact the cardiac imaging nurse navigator should you have any questions/concerns: Rockwell Alexandria, Cardiac Imaging Nurse Navigator Larey Brick, Cardiac Imaging Nurse Navigator [facility] Direct Office Dial: [phone]   For scheduling needs, including cancellations and rescheduling, please call [place], [phone].   Important Information About Sugar         Signed, Kristeen Miss, MD  07/15/2022 5:44 PM    [facility]

## 2022-07-12 ENCOUNTER — Other Ambulatory Visit: Payer: Self-pay | Admitting: Physician Assistant

## 2022-07-12 ENCOUNTER — Ambulatory Visit: Payer: 59 | Attending: Rheumatology | Admitting: Rheumatology

## 2022-07-12 ENCOUNTER — Ambulatory Visit: Payer: 59

## 2022-07-12 ENCOUNTER — Other Ambulatory Visit (HOSPITAL_COMMUNITY): Payer: Self-pay

## 2022-07-12 DIAGNOSIS — M3500 Sicca syndrome, unspecified: Secondary | ICD-10-CM | POA: Diagnosis not present

## 2022-07-12 DIAGNOSIS — Z79899 Other long term (current) drug therapy: Secondary | ICD-10-CM

## 2022-07-12 DIAGNOSIS — M0579 Rheumatoid arthritis with rheumatoid factor of multiple sites without organ or systems involvement: Secondary | ICD-10-CM

## 2022-07-12 DIAGNOSIS — M79644 Pain in right finger(s): Secondary | ICD-10-CM | POA: Diagnosis not present

## 2022-07-12 MED ORDER — TRIAMCINOLONE ACETONIDE 40 MG/ML IJ SUSP
10.0000 mg | INTRAMUSCULAR | Status: AC | PRN
  Administered 2022-07-12: 10 mg via INTRA_ARTICULAR

## 2022-07-12 MED ORDER — LIDOCAINE HCL 1 % IJ SOLN
0.5000 mL | INTRAMUSCULAR | Status: AC | PRN
  Administered 2022-07-12: .5 mL

## 2022-07-12 MED ORDER — SIMPONI 50 MG/0.5ML ~~LOC~~ SOAJ
50.0000 mg | SUBCUTANEOUS | 0 refills | Status: DC
  Filled 2022-07-21: qty 0.5, 30d supply, fill #0

## 2022-07-12 NOTE — Progress Notes (Signed)
   Procedure Note  Patient: Sharon Hunter             Date of Birth: 04-Sep-1961           MRN: [id number]             Visit Date: 07/12/2022  Procedures: Visit Diagnoses: Right CMC arthritis 1. Pain of right thumb   2. Rheumatoid arthritis involving multiple sites with positive rheumatoid factor (HCC)   3. High risk medication use     Small Joint Inj: R thumb CMC on 07/12/2022 3:08 PM Indications: pain Details: 27 G needle, ultrasound-guided radial approach  Spinal Needle: No  Medications: 0.5 mL lidocaine 1 %; 10 mg triamcinolone acetonide 40 MG/ML Aspirate: 0 mL Outcome: tolerated well, no immediate complications Procedure, treatment alternatives, risks and benefits explained, specific risks discussed. Consent was given by the patient. Immediately prior to procedure a time out was called to verify the correct patient, procedure, equipment, support staff and site/side marked as required. Patient was prepped and draped in the usual sterile fashion.    Postprocedure instructions were given.  Patient was advised to use right CMC brace. Bo Merino, MD

## 2022-07-12 NOTE — Telephone Encounter (Signed)
Next Visit: 10/11/2021  Last Visit: 05/02/2022  Last Fill: 06/20/2022  DX: Rheumatoid arthritis involving multiple sites with positive rheumatoid factor   Current Dose per office note 05/02/2022: Simponi 50 mg sq injections every 28 days  Labs: 02/22/2022 CMP Glucose 110, CBC WNL  TB Gold: 07/27/2021 Neg   Patient is coming in for injections today, will update labs(CMP, CBC, TB Gold).  Okay to refill Simponi?

## 2022-07-13 LAB — COMPLETE METABOLIC PANEL WITH GFR
AG Ratio: 1.6 (calc) (ref 1.0–2.5)
ALT: 22 U/L (ref 6–29)
AST: 14 U/L (ref 10–35)
Albumin: 4.2 g/dL (ref 3.6–5.1)
Alkaline phosphatase (APISO): 62 U/L (ref 37–153)
BUN: 21 mg/dL (ref 7–25)
CO2: 28 mmol/L (ref 20–32)
Calcium: 9.7 mg/dL (ref 8.6–10.4)
Chloride: 103 mmol/L (ref 98–110)
Creat: 0.64 mg/dL (ref 0.50–1.05)
Globulin: 2.7 g/dL (calc) (ref 1.9–3.7)
Glucose, Bld: 96 mg/dL (ref 65–99)
Potassium: 3.9 mmol/L (ref 3.5–5.3)
Sodium: 139 mmol/L (ref 135–146)
Total Bilirubin: 0.4 mg/dL (ref 0.2–1.2)
Total Protein: 6.9 g/dL (ref 6.1–8.1)
eGFR: 100 mL/min/{1.73_m2} (ref >=60)

## 2022-07-13 LAB — CBC WITH DIFFERENTIAL/PLATELET
Absolute Monocytes: 608 cells/uL (ref 200–950)
Basophils Absolute: 38 cells/uL (ref 0–200)
Basophils Relative: 0.5 %
Eosinophils Absolute: 128 cells/uL (ref 15–500)
Eosinophils Relative: 1.7 %
HCT: 38.9 % (ref 35.0–45.0)
Hemoglobin: 13.3 g/dL (ref 11.7–15.5)
Lymphs Abs: 2693 cells/uL (ref 850–3900)
MCH: 31.7 pg (ref 27.0–33.0)
MCHC: 34.2 g/dL (ref 32.0–36.0)
MCV: 92.8 fL (ref 80.0–100.0)
MPV: 10.7 fL (ref 7.5–12.5)
Monocytes Relative: 8.1 %
Neutro Abs: 4035 cells/uL (ref 1500–7800)
Neutrophils Relative %: 53.8 %
Platelets: 283 10*3/uL (ref 140–400)
RBC: 4.19 10*6/uL (ref 3.80–5.10)
RDW: 12.2 % (ref 11.0–15.0)
Total Lymphocyte: 35.9 %
WBC: 7.5 10*3/uL (ref 3.8–10.8)

## 2022-07-13 NOTE — Progress Notes (Signed)
CBC and CMP are normal.

## 2022-07-15 ENCOUNTER — Ambulatory Visit: Payer: 59 | Attending: Cardiovascular Disease | Admitting: Cardiovascular Disease

## 2022-07-15 ENCOUNTER — Encounter: Payer: Self-pay | Admitting: Cardiovascular Disease

## 2022-07-15 ENCOUNTER — Other Ambulatory Visit (HOSPITAL_COMMUNITY): Payer: Self-pay

## 2022-07-15 VITALS — BP 160/90 | HR 61 | Ht 66.0 in | Wt 182.6 lb

## 2022-07-15 DIAGNOSIS — I447 Left bundle-branch block, unspecified: Secondary | ICD-10-CM | POA: Diagnosis not present

## 2022-07-15 DIAGNOSIS — I10 Essential (primary) hypertension: Secondary | ICD-10-CM

## 2022-07-15 DIAGNOSIS — R9431 Abnormal electrocardiogram [ECG] [EKG]: Secondary | ICD-10-CM

## 2022-07-15 MED ORDER — METOPROLOL TARTRATE 50 MG PO TABS
ORAL_TABLET | ORAL | 0 refills | Status: DC
  Filled 2022-07-15: qty 1, 1d supply, fill #0

## 2022-07-15 MED ORDER — PREDNISONE 50 MG PO TABS
ORAL_TABLET | ORAL | 0 refills | Status: DC
  Filled 2022-07-15: qty 3, 1d supply, fill #0

## 2022-07-15 MED ORDER — DIPHENHYDRAMINE HCL 50 MG PO TABS
ORAL_TABLET | ORAL | 0 refills | Status: DC
  Filled 2022-07-15: qty 1, fill #0

## 2022-07-15 NOTE — Patient Instructions (Addendum)
Medication Instructions:  Your physician recommends that you continue on your current medications as directed. Please refer to the Current Medication list given to you today.  *If you need a refill on your cardiac medications before your next appointment, please call your pharmacy*   Lab Work: NONE If you have labs (blood work) drawn today and your tests are completely normal, you will receive your results only by: Nordic (if you have MyChart) OR A paper copy in the mail If you have any lab test that is abnormal or we need to change your treatment, we will call you to review the results.  Testing/Procedures: Coronary CT Angiogram Your physician has requested that you have cardiac CT. Cardiac computed tomography (CT) is a painless test that uses an x-ray machine to take clear, detailed pictures of your heart. For further information please visit [email or web address]. Please follow instruction sheet as given.  Follow-Up: At [facility], you and your health needs are our priority.  As part of our continuing mission to provide you with exceptional heart care, we have created designated Provider Care Teams.  These Care Teams include your primary Cardiologist (physician) and Advanced Practice Providers (APPs -  Physician Assistants and Nurse Practitioners) who all work together to provide you with the care you need, when you need it.  Your next appointment:   3 month(s)  The format for your next appointment:   In Person  Provider:   Mertie Moores, MD  Other Instructions   Your cardiac CT will be scheduled at:   [facility] [address] [place], [place] [address] [phone]  Please arrive at the [facility] and Children's Entrance (Entrance C2) of [facility] 30 minutes prior to test start time. You can use the FREE valet parking offered at entrance C (encouraged to control the heart rate for the test)  Proceed to the [facility] Radiology  Department (first floor) to check-in and test prep.  All radiology patients and guests should use entrance C2 at [facility], accessed from [facility], even though the hospital's physical address listed is [address]    Please follow these instructions carefully (unless otherwise directed):   On the Night Before the Test: Be sure to Drink plenty of water. Do not consume any caffeinated/decaffeinated beverages or chocolate 12 hours prior to your test. Do not take any antihistamines 12 hours prior to your test. If the patient has contrast allergy: Patient will need a prescription for Prednisone and very clear instructions (as follows): Prednisone 50 mg - take 13 hours prior to test Take another Prednisone 50 mg 7 hours prior to test Take another Prednisone 50 mg 1 hour prior to test Take Benadryl 50 mg 1 hour prior to test Patient must complete all four doses of above prophylactic medications. Patient will need a ride after test due to Benadryl.  On the Day of the Test: Drink plenty of water until 1 hour prior to the test. Do not eat any food 1 hour prior to test. You may take your regular medications prior to the test.  Take metoprolol (Lopressor) two hours prior to test. HOLD Hydrochlorothiazide morning of the test. FEMALES- please wear underwire-free bra if available, avoid dresses & tight clothing  After the Test: Drink plenty of water. After receiving IV contrast, you may experience a mild flushed feeling. This is normal. On occasion, you may experience a mild rash up to 24 hours after the test. This is  not dangerous. If this occurs, you can take Benadryl 25 mg and increase your fluid intake. If you experience trouble breathing, this can be serious. If it is severe call 911 IMMEDIATELY. If it is mild, please call our office. If you take any of these medications: Glipizide/Metformin, Avandament, Glucavance, please do not take 48 hours after  completing test unless otherwise instructed.  We will call to schedule your test 2-4 weeks out understanding that some insurance companies will need an authorization prior to the service being performed.   For non-scheduling related questions, please contact the cardiac imaging nurse navigator should you have any questions/concerns: Marchia Bond, Cardiac Imaging Nurse Navigator Gordy Clement, Cardiac Imaging Nurse Navigator [facility] Direct Office Dial: [phone]   For scheduling needs, including cancellations and rescheduling, please call [place], [phone].   Important Information About Sugar

## 2022-07-20 ENCOUNTER — Other Ambulatory Visit (HOSPITAL_COMMUNITY): Payer: Self-pay

## 2022-07-21 ENCOUNTER — Other Ambulatory Visit (HOSPITAL_COMMUNITY): Payer: Self-pay

## 2022-07-26 ENCOUNTER — Telehealth: Payer: Self-pay

## 2022-07-26 NOTE — Telephone Encounter (Signed)
Evenity VOB initiated via [email or web address]  New start - Prolia vs Evenity VOB

## 2022-07-27 ENCOUNTER — Ambulatory Visit (INDEPENDENT_AMBULATORY_CARE_PROVIDER_SITE_OTHER): Payer: 59 | Admitting: Family Medicine

## 2022-07-27 ENCOUNTER — Encounter (INDEPENDENT_AMBULATORY_CARE_PROVIDER_SITE_OTHER): Payer: Self-pay | Admitting: Family Medicine

## 2022-07-27 VITALS — BP 124/75 | Temp 97.8°F | Ht 66.0 in | Wt 180.0 lb

## 2022-07-27 DIAGNOSIS — E559 Vitamin D deficiency, unspecified: Secondary | ICD-10-CM

## 2022-07-27 DIAGNOSIS — I10 Essential (primary) hypertension: Secondary | ICD-10-CM

## 2022-07-27 DIAGNOSIS — E669 Obesity, unspecified: Secondary | ICD-10-CM | POA: Diagnosis not present

## 2022-07-27 DIAGNOSIS — F39 Unspecified mood [affective] disorder: Secondary | ICD-10-CM

## 2022-07-27 DIAGNOSIS — Z6829 Body mass index (BMI) 29.0-29.9, adult: Secondary | ICD-10-CM | POA: Diagnosis not present

## 2022-07-29 ENCOUNTER — Telehealth (HOSPITAL_COMMUNITY): Payer: Self-pay | Admitting: Emergency Medicine

## 2022-07-29 NOTE — Telephone Encounter (Signed)
Attempted to call patient regarding upcoming cardiac CT appointment. °Left message on voicemail with name and callback number °Craige Patel RN Navigator Cardiac Imaging °[facility] °[phone] Office °[phone] Cell ° °

## 2022-08-01 ENCOUNTER — Ambulatory Visit (HOSPITAL_COMMUNITY): Payer: 59

## 2022-08-02 DIAGNOSIS — F39 Unspecified mood [affective] disorder: Secondary | ICD-10-CM | POA: Insufficient documentation

## 2022-08-02 DIAGNOSIS — E669 Obesity, unspecified: Secondary | ICD-10-CM | POA: Insufficient documentation

## 2022-08-08 ENCOUNTER — Other Ambulatory Visit (HOSPITAL_COMMUNITY): Payer: Self-pay

## 2022-08-09 ENCOUNTER — Other Ambulatory Visit (HOSPITAL_COMMUNITY): Payer: Self-pay

## 2022-08-09 ENCOUNTER — Other Ambulatory Visit: Payer: Self-pay | Admitting: Physician Assistant

## 2022-08-09 DIAGNOSIS — M0579 Rheumatoid arthritis with rheumatoid factor of multiple sites without organ or systems involvement: Secondary | ICD-10-CM

## 2022-08-09 MED ORDER — SIMPONI 50 MG/0.5ML ~~LOC~~ SOAJ
50.0000 mg | SUBCUTANEOUS | 0 refills | Status: DC
  Filled 2022-08-09: qty 0.5, 30d supply, fill #0

## 2022-08-09 NOTE — Telephone Encounter (Signed)
Next Visit: 10/11/2022  Last Visit: 05/02/2022  Last Fill: 07/12/2022 (30 day supply)  [id number] arthritis involving multiple sites with positive rheumatoid factor   Current Dose per office note 05/02/2022: Simponi 50 mg sq injections every 28 days   Labs: 07/12/2022 CBC and CMP are normal.   TB Gold: 07/27/2021 Neg   Patient updating TB Gold with health at work.   Okay to refill Simponi?

## 2022-08-09 NOTE — Progress Notes (Signed)
Chief Complaint:   OBESITY Sharon Hunter is here to discuss her progress with her obesity treatment plan along with follow-up of her obesity related diagnoses. Sharon Hunter is on keeping a food journal and adhering to recommended goals of [phone] calories and 80+ grams of protein daily and states she is following her eating plan approximately 75% of the time. Sharon Hunter states she is hiking for 2 hours 1 time per week, and walking for 20 minutes 7 times per week.  Today's visit was #: 7 Starting weight: 191 lbs Starting date: 02/22/2022 Today's weight: 180 lbs Today's date: 07/27/2022 Total lbs lost to date: 11 Total lbs lost since last in-office visit: 0  Interim History: Patient has been eating a little more candy, and/or sweets.  She had 2 slices of cake.  Subjective:   1. Essential hypertension Patient is taking ARB last, hydrochlorothiazide, and Lopressor.  She is asymptomatic, and she denies concerns.  2. Vitamin D deficiency She is currently taking OTC vitamin D 5,000 IU each day. She denies nausea, vomiting or muscle weakness.  3. Mood disorder ([place])- Emotional Eating Patient is taking Zoloft, and she notes her emotional eating is pretty well controlled despite increased stressors.    Assessment/Plan:  No orders of the defined types were placed in this encounter.   There are no discontinued medications.   No orders of the defined types were placed in this encounter.    1. Essential hypertension Patient's blood pressure is at goal today. Sharon Hunter will work on healthy weight loss and exercise to improve blood pressure control. We will watch for signs of hypotension as she continues her lifestyle modifications.  2. Vitamin D deficiency Low Vitamin D level contributes to fatigue and are associated with obesity, breast, and colon cancer. She will continue OTC Vitamin D 5,000 IU daily and will follow-up for routine testing of Vitamin D, at least 2-3 times per year to avoid  over-replacement.  3. Mood disorder ([place])- Emotional Eating Patient's mood is stable. Behavior modification techniques were discussed today to help Sharon Hunter deal with her emotional/non-hunger eating behaviors.  Orders and follow up as documented in patient record.   4. Obesity, Current BMI 29.1 Sharon Hunter is currently in the action stage of change. As such, her goal is to continue with weight loss efforts. She has agreed to keeping a food journal and adhering to recommended goals of [phone] calories and 80+ grams of protein.   Patient's goal is to lose 4 pounds by her next office visit.  She is to eat more GoLo recipes and more eggs, and keep up with her exercise.  Exercise goals: As is.   Behavioral modification strategies: increasing lean protein intake and decreasing simple carbohydrates.  Sharon Hunter has agreed to follow-up with our clinic in 4 weeks. She was informed of the importance of frequent follow-up visits to maximize her success with intensive lifestyle modifications for her multiple health conditions.   Objective:   Blood pressure 124/75, temperature 97.8 F (36.6 C), height '5\' 6"'$  (1.676 m), weight 180 lb (81.6 kg), last menstrual period 10/03/2005, SpO2 99 %. Body mass index is 29.05 kg/m.  General: Cooperative, alert, well developed, in no acute distress. HEENT: Conjunctivae and lids unremarkable. Cardiovascular: Regular rhythm.  Lungs: Normal work of breathing. Neurologic: No focal deficits.   Lab Results  Component Value Date   CREATININE 0.64 07/12/2022   BUN 21 07/12/2022   NA 139 07/12/2022   K 3.9 07/12/2022   CL 103 07/12/2022   CO2  28 07/12/2022   Lab Results  Component Value Date   ALT 22 07/12/2022   AST 14 07/12/2022   ALKPHOS 81 02/22/2022   BILITOT 0.4 07/12/2022   Lab Results  Component Value Date   HGBA1C 5.8 (H) 05/30/2022   HGBA1C 5.7 (H) 02/22/2022   Lab Results  Component Value Date   INSULIN 3.0 05/30/2022   INSULIN 11.3 02/22/2022   Lab  Results  Component Value Date   TSH 0.753 02/22/2022   Lab Results  Component Value Date   CHOL 232 (H) 05/30/2022   HDL 97 05/30/2022   LDLCALC 118 (H) 05/30/2022   LDLDIRECT 132.6 05/23/2013   TRIG 102 05/30/2022   CHOLHDL 2.4 05/30/2022   Lab Results  Component Value Date   VD25OH 36.0 05/30/2022   VD25OH 31.0 02/22/2022   VD25OH 32 01/30/2018   Lab Results  Component Value Date   WBC 7.5 07/12/2022   HGB 13.3 07/12/2022   HCT 38.9 07/12/2022   MCV 92.8 07/12/2022   PLT 283 07/12/2022   No results found for: "IRON", "TIBC", "FERRITIN"  Attestation Statements:   Reviewed by clinician on day of visit: allergies, medications, problem list, medical history, surgical history, family history, social history, and previous encounter notes.  Time spent on visit including pre-visit chart review and post-visit care and charting was 20 minutes.   Wilhemena Durie, am acting as transcriptionist for [employer], DO.   I have reviewed the above documentation for accuracy and completeness, and I agree with the above. Sharon Hunter, D.O.  The [place] was signed into law in 2016 which includes the topic of electronic health records.  This provides immediate access to information in MyChart.  This includes consultation notes, operative notes, office notes, lab results and pathology reports.  If you have any questions about what you read please let us know at your next visit so we can discuss your concerns and take corrective action if need be.  We are right here with you.

## 2022-08-19 ENCOUNTER — Other Ambulatory Visit (HOSPITAL_COMMUNITY): Payer: Self-pay

## 2022-08-23 ENCOUNTER — Other Ambulatory Visit (HOSPITAL_COMMUNITY): Payer: Self-pay

## 2022-08-28 NOTE — Telephone Encounter (Signed)
Prior auth required for EVENITY  PA PROCESS DETAILS: Prior Authorization is Required. We are unable to confirm if a PA is on file. Please check your records or contact the payer.

## 2022-08-28 NOTE — Telephone Encounter (Signed)
      CO-PAY ENROLLMENT

## 2022-08-31 ENCOUNTER — Other Ambulatory Visit (HOSPITAL_COMMUNITY): Payer: Self-pay

## 2022-08-31 ENCOUNTER — Encounter (INDEPENDENT_AMBULATORY_CARE_PROVIDER_SITE_OTHER): Payer: Self-pay | Admitting: Family Medicine

## 2022-08-31 ENCOUNTER — Ambulatory Visit (INDEPENDENT_AMBULATORY_CARE_PROVIDER_SITE_OTHER): Payer: 59 | Admitting: Family Medicine

## 2022-08-31 VITALS — BP 125/66 | HR 54 | Temp 97.9°F | Ht 66.0 in | Wt 180.0 lb

## 2022-08-31 DIAGNOSIS — E669 Obesity, unspecified: Secondary | ICD-10-CM | POA: Diagnosis not present

## 2022-08-31 DIAGNOSIS — Z6829 Body mass index (BMI) 29.0-29.9, adult: Secondary | ICD-10-CM | POA: Diagnosis not present

## 2022-08-31 DIAGNOSIS — R7303 Prediabetes: Secondary | ICD-10-CM | POA: Diagnosis not present

## 2022-09-05 ENCOUNTER — Other Ambulatory Visit (HOSPITAL_COMMUNITY): Payer: Self-pay

## 2022-09-05 NOTE — Telephone Encounter (Signed)
Pharmacy Patient Advocate Encounter   Received notification from [employer] that prior authorization for Evenity '105MG'$ /1.17ML syringes is required/requested.   PA submitted on 09/05/22 to [employer] via CoverMyMeds [facility]  Status is pending

## 2022-09-05 NOTE — Telephone Encounter (Signed)
Pt ready for scheduling on or after 09/05/22  Out-of-pocket cost due at time of visit: $468  Primary: Medicare Evenity co-insurance: 20% (approximately $418) Admin fee co-insurance: $50.00  Secondary:  Evenity co-insurance:  Admin fee co-insurance:   Deductible: $350 met of $350 required  Prior Auth:  PA# Valid:   ** This summary of benefits is an estimation of the patient's out-of-pocket cost. Exact cost may vary based on individual plan coverage.

## 2022-09-05 NOTE — Telephone Encounter (Signed)
Patient Advocate Encounter  Prior Authorization for Evenity '105MG'$ /1.17ML syringes has been approved.    PA# [id number]  Key: [facility]  Effective dates: 09/05/2022 through 09/04/2022

## 2022-09-06 ENCOUNTER — Other Ambulatory Visit (HOSPITAL_COMMUNITY): Payer: Self-pay

## 2022-09-13 ENCOUNTER — Other Ambulatory Visit (HOSPITAL_COMMUNITY): Payer: Self-pay

## 2022-09-13 ENCOUNTER — Encounter: Payer: Self-pay | Admitting: *Deleted

## 2022-09-13 ENCOUNTER — Other Ambulatory Visit: Payer: Self-pay | Admitting: Rheumatology

## 2022-09-13 DIAGNOSIS — M0579 Rheumatoid arthritis with rheumatoid factor of multiple sites without organ or systems involvement: Secondary | ICD-10-CM

## 2022-09-13 MED ORDER — SIMPONI 50 MG/0.5ML ~~LOC~~ SOAJ
50.0000 mg | SUBCUTANEOUS | 0 refills | Status: DC
  Filled 2022-09-13 – 2022-09-16 (×2): qty 0.5, 30d supply, fill #0

## 2022-09-13 NOTE — Telephone Encounter (Signed)
Next Visit: 10/11/2022   Last Visit: 05/02/2022   Last Fill: 08/09/2022 (30 day supply)   PR:AFOADLKZGF arthritis involving multiple sites with positive rheumatoid factor    Current Dose per office note 05/02/2022: Simponi 50 mg sq injections every 28 days    Labs: 07/12/2022 CBC and CMP are normal.    TB Gold: 07/27/2021 Neg    Patient updating TB Gold with health at work.    Okay to refill Simponi?

## 2022-09-14 ENCOUNTER — Other Ambulatory Visit (HOSPITAL_COMMUNITY): Payer: Self-pay

## 2022-09-14 ENCOUNTER — Ambulatory Visit (INDEPENDENT_AMBULATORY_CARE_PROVIDER_SITE_OTHER): Payer: 59 | Admitting: Physician Assistant

## 2022-09-14 ENCOUNTER — Encounter (INDEPENDENT_AMBULATORY_CARE_PROVIDER_SITE_OTHER): Payer: Self-pay | Admitting: Physician Assistant

## 2022-09-14 VITALS — BP 121/76 | HR 53 | Temp 98.1°F | Ht 66.0 in | Wt 180.0 lb

## 2022-09-14 DIAGNOSIS — Z6829 Body mass index (BMI) 29.0-29.9, adult: Secondary | ICD-10-CM | POA: Diagnosis not present

## 2022-09-14 DIAGNOSIS — E538 Deficiency of other specified B group vitamins: Secondary | ICD-10-CM | POA: Diagnosis not present

## 2022-09-14 DIAGNOSIS — M0579 Rheumatoid arthritis with rheumatoid factor of multiple sites without organ or systems involvement: Secondary | ICD-10-CM | POA: Diagnosis not present

## 2022-09-14 DIAGNOSIS — R7303 Prediabetes: Secondary | ICD-10-CM | POA: Diagnosis not present

## 2022-09-14 DIAGNOSIS — E559 Vitamin D deficiency, unspecified: Secondary | ICD-10-CM | POA: Diagnosis not present

## 2022-09-14 DIAGNOSIS — E669 Obesity, unspecified: Secondary | ICD-10-CM

## 2022-09-14 NOTE — Telephone Encounter (Signed)
Spoke with patient. Pt states she does not want to do that shot due to cost but states she would like to look into the shot again at the start of the new year with new insurance.

## 2022-09-14 NOTE — Progress Notes (Signed)
Chief Complaint:   OBESITY Sharon Hunter is here to discuss her progress with her obesity treatment plan along with follow-up of her obesity related diagnoses. [name] is on keeping a food journal and adhering to recommended goals of [phone] calories and 80+ grams of protein and states she is following her eating plan approximately 0% of the time. [name] states she is walking a little.   Today's visit was #: 8 Starting weight: 191 lbs Starting date: 02/22/2022 Today's weight: 180 lbs Today's date: 08/31/2022 Total lbs lost to date: 11 lbs Total lbs lost since last in-office visit: 0  Interim History: Sharon Hunter feeling hopeless and overwhelmed. She feels she is a healthy eater. She is often not getting enough protein. Limited interms of time. She is not journaling but is not doing much in terms of following Cat 1.  Subjective:   1. Prediabetes Autry's A1c at 5.8, Insulin at 3.0. She is not on medication.  Assessment/Plan:   1. Prediabetes Will repeat labs in January.  2. Obesity, Current BMI 29.1 [name] is currently in the action stage of change. As such, her goal is to continue with weight loss efforts. She has agreed to the Category 1 Plan and keeping a food journal and adhering to recommended goals of 1000-1100 calories and 75+ grams of protein daily.   Wants to increase water and get trampoline out. Wants to eat more frequently.  Exercise goals: All adults should avoid inactivity. Some physical activity is better than none, and adults who participate in any amount of physical activity gain some health benefits.  Behavioral modification strategies: increasing lean protein intake, meal planning and cooking strategies, keeping healthy foods in the home, and planning for success.  Sharon Hunter has agreed to follow-up with our clinic in 2 weeks. She was informed of the importance of frequent follow-up visits to maximize her success with intensive lifestyle modifications for her multiple health conditions.    Objective:   Blood pressure 125/66, pulse (!) 54, temperature 97.9 F (36.6 C), height '5\' 6"'$  (1.676 m), weight 180 lb (81.6 kg), last menstrual period 10/03/2005, SpO2 98 %. Body mass index is 29.05 kg/m.  General: Cooperative, alert, well developed, in no acute distress. HEENT: Conjunctivae and lids unremarkable. Cardiovascular: Regular rhythm.  Lungs: Normal work of breathing. Neurologic: No focal deficits.   Lab Results  Component Value Date   CREATININE 0.64 07/12/2022   BUN 21 07/12/2022   NA 139 07/12/2022   K 3.9 07/12/2022   CL 103 07/12/2022   CO2 28 07/12/2022   Lab Results  Component Value Date   ALT 22 07/12/2022   AST 14 07/12/2022   ALKPHOS 81 02/22/2022   BILITOT 0.4 07/12/2022   Lab Results  Component Value Date   HGBA1C 5.8 (H) 05/30/2022   HGBA1C 5.7 (H) 02/22/2022   Lab Results  Component Value Date   INSULIN 3.0 05/30/2022   INSULIN 11.3 02/22/2022   Lab Results  Component Value Date   TSH 0.753 02/22/2022   Lab Results  Component Value Date   CHOL 232 (H) 05/30/2022   HDL 97 05/30/2022   LDLCALC 118 (H) 05/30/2022   LDLDIRECT 132.6 05/23/2013   TRIG 102 05/30/2022   CHOLHDL 2.4 05/30/2022   Lab Results  Component Value Date   VD25OH 36.0 05/30/2022   VD25OH 31.0 02/22/2022   VD25OH 32 01/30/2018   Lab Results  Component Value Date   WBC 7.5 07/12/2022   HGB 13.3 07/12/2022   HCT 38.9 07/12/2022  MCV 92.8 07/12/2022   PLT 283 07/12/2022   No results found for: "IRON", "TIBC", "FERRITIN"  Attestation Statements:   Reviewed by clinician on day of visit: allergies, medications, problem list, medical history, surgical history, family history, social history, and previous encounter notes.  I, Elnora Morrison, RMA am acting as transcriptionist for Coralie Common, MD.  I have reviewed the above documentation for accuracy and completeness, and I agree with the above. - Coralie Common, MD

## 2022-09-16 ENCOUNTER — Other Ambulatory Visit (HOSPITAL_COMMUNITY): Payer: Self-pay

## 2022-09-16 ENCOUNTER — Other Ambulatory Visit: Payer: Self-pay

## 2022-09-21 ENCOUNTER — Other Ambulatory Visit: Payer: Self-pay

## 2022-09-22 ENCOUNTER — Ambulatory Visit: Payer: 59

## 2022-09-22 ENCOUNTER — Ambulatory Visit: Payer: 59 | Attending: Rheumatology | Admitting: Rheumatology

## 2022-09-22 ENCOUNTER — Encounter: Payer: Self-pay | Admitting: Rheumatology

## 2022-09-22 ENCOUNTER — Other Ambulatory Visit (HOSPITAL_COMMUNITY): Payer: Self-pay

## 2022-09-22 VITALS — BP 134/88 | HR 64 | Resp 16 | Ht 66.0 in | Wt 179.0 lb

## 2022-09-22 DIAGNOSIS — R5383 Other fatigue: Secondary | ICD-10-CM

## 2022-09-22 DIAGNOSIS — Z8659 Personal history of other mental and behavioral disorders: Secondary | ICD-10-CM

## 2022-09-22 DIAGNOSIS — M19041 Primary osteoarthritis, right hand: Secondary | ICD-10-CM | POA: Diagnosis not present

## 2022-09-22 DIAGNOSIS — M3500 Sicca syndrome, unspecified: Secondary | ICD-10-CM | POA: Diagnosis not present

## 2022-09-22 DIAGNOSIS — Z8639 Personal history of other endocrine, nutritional and metabolic disease: Secondary | ICD-10-CM | POA: Diagnosis not present

## 2022-09-22 DIAGNOSIS — Z8679 Personal history of other diseases of the circulatory system: Secondary | ICD-10-CM | POA: Diagnosis not present

## 2022-09-22 DIAGNOSIS — M25512 Pain in left shoulder: Secondary | ICD-10-CM

## 2022-09-22 DIAGNOSIS — M25511 Pain in right shoulder: Secondary | ICD-10-CM

## 2022-09-22 DIAGNOSIS — R911 Solitary pulmonary nodule: Secondary | ICD-10-CM

## 2022-09-22 DIAGNOSIS — Z79899 Other long term (current) drug therapy: Secondary | ICD-10-CM | POA: Diagnosis not present

## 2022-09-22 DIAGNOSIS — M0579 Rheumatoid arthritis with rheumatoid factor of multiple sites without organ or systems involvement: Secondary | ICD-10-CM | POA: Diagnosis not present

## 2022-09-22 DIAGNOSIS — Z8619 Personal history of other infectious and parasitic diseases: Secondary | ICD-10-CM

## 2022-09-22 DIAGNOSIS — M19042 Primary osteoarthritis, left hand: Secondary | ICD-10-CM

## 2022-09-22 DIAGNOSIS — G8929 Other chronic pain: Secondary | ICD-10-CM

## 2022-09-22 DIAGNOSIS — M25471 Effusion, right ankle: Secondary | ICD-10-CM | POA: Diagnosis not present

## 2022-09-22 MED ORDER — PREDNISONE 5 MG PO TABS
ORAL_TABLET | ORAL | 0 refills | Status: AC
  Filled 2022-09-22: qty 40, 16d supply, fill #0

## 2022-09-22 NOTE — Progress Notes (Signed)
Office Visit Note  Patient: Sharon Hunter             Date of Birth: [birth year]           MRN: [id number]             PCP: [name], DO Referring: [name], * Visit Date: 09/22/2022 Occupation: '@GUAROCC'$ @  Subjective:  Pain of the Right Ankle and Follow-up   History of Present Illness: Sharon Hunter is a 61 y.o. female with history of seropositive rheumatoid arthritis and osteoarthritis.  She states for the last couple of months she has been having increased joint pain and discomfort.  She complains of discomfort in her bilateral shoulders, bilateral hips and her TMJs.  She states yesterday she woke up with pain and swelling in her right ankle joint.  She took Advil which relieved her discomfort.  She has been having increasing stiffness all over.  Her right CMC joint continues to hurt.  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    Activities of Daily Living:  Patient reports morning stiffness for 20 minutes.   Patient Reports nocturnal pain.  Difficulty dressing/grooming: Reports Difficulty climbing stairs: Reports Difficulty getting out of chair: Denies Difficulty using hands for taps, buttons, cutlery, and/or writing: Reports.  Review of Systems  Constitutional:  Positive for fatigue.  HENT:  Positive for mouth dryness. Negative for mouth sores.   Eyes:  Positive for dryness.  Respiratory: Negative.  Negative for shortness of breath.   Cardiovascular: Negative.  Negative for chest pain and palpitations.  Gastrointestinal: Negative.  Negative for blood in stool, constipation and diarrhea.  Endocrine: Negative.  Negative for increased urination.  Genitourinary: Negative.  Negative for involuntary urination.  Musculoskeletal:  Positive for joint pain, joint pain, joint swelling, muscle weakness and morning stiffness. Negative for gait problem, myalgias, muscle tenderness and myalgias.  Skin:  Negative for color change, rash, hair  loss and sensitivity to sunlight.  Allergic/Immunologic: Negative.  Negative for susceptible to infections.  Neurological: Negative.  Negative for dizziness and headaches.  Hematological:  Positive for swollen glands.  Psychiatric/Behavioral:  Positive for sleep disturbance. Negative for depressed mood. The patient is nervous/anxious.     PMFS History:  Patient Active Problem List   Diagnosis Date Noted   Mood disorder ([place])- Emotional Eating 08/02/2022   Class 1 obesity with serious comorbidity and body mass index (BMI) of 30.0 to 30.9 in adult 08/02/2022   At risk for impaired metabolic function 76/16/0737   Perioral dermatitis 05/26/2022   Preventative health care 05/26/2022   Essential hypertension 05/26/2022   Abnormal EKG 05/26/2022   Prediabetes 03/09/2022   Vitamin B12 deficiency 03/09/2022   Primary hypertension 02/22/2022   Hyperlipidemia 02/22/2022   GAD (generalized anxiety disorder) 02/22/2022   Vitamin D deficiency 02/22/2022   Ear pain, left 08/13/2021   History of anxiety 03/27/2017   History of hypertension 03/27/2017   History of bundle branch block 03/27/2017   Primary osteoarthritis of both hands 12/22/2016   History of shingles 08/02/2016   High risk medication use 08/02/2016   Hypertension 08/01/2016   IBS (irritable bowel syndrome) 08/01/2016   Anxiety 08/01/2016   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   LBBB (left bundle branch block) 01/30/2015   Palpitations 01/30/2015   Pulmonary nodule 07/04/2011   Murmur 02/03/2011   Rheumatoid arthritis ([place]) 02/03/2011   Hair loss 02/03/2011   Fatigue 02/03/2011  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   Hypertension    IBS (irritable bowel syndrome) 08/01/2016   Joint pain    Lactose intolerance    Migraine    Murmur 02/03/2011   Obese    Ovarian failure    Palpitation    Pulmonary  nodule, left 2009   last CT Nov 2011-- due 08/2011   Rheumatoid arthritis(714.0)    in remission , off meds as of Summer 2012   Vitamin D deficiency     Family History  Problem Relation Age of Onset   Arthritis Mother    Uterine cancer Mother    Hypertension Mother    Diabetes Mother    Alcohol abuse Father    Hyperlipidemia Father    Heart disease Father    High blood pressure Father    Sleep apnea Father    Heart disease Sister        cardiac stent   Diabetes Sister    Hyperlipidemia Sister    Hypertension Sister    Alcohol abuse Brother    Diabetes Brother    Hypertension Brother    Cancer Brother        lung   Cancer Maternal Aunt        lung----non smoker   Past Surgical History:  Procedure Laterality Date   CHOLECYSTECTOMY  1999   COLONOSCOPY  10/31/11   EXPLORATORY LAPAROTOMY     TONSILLECTOMY  1969   WISDOM TOOTH EXTRACTION     Social History   Social History Narrative   Exercise-- jogs   Immunization History  Administered Date(s) Administered   Influenza Split 07/03/2013   Influenza,inj,Quad PF,6-35 Mos 07/17/2020   Influenza-Unspecified 07/03/2014, 07/04/2015   PFIZER(Purple Top)SARS-COV-2 Vaccination 01/13/2020, 05/15/2020   Tdap 12/01/2009     Objective: Vital Signs: BP 134/88 (BP Location: Left Arm, Patient Position: Sitting, Cuff Size: Normal)   Pulse 64   Resp 16   Ht '5\' 6"'$  (1.676 m)   Wt 179 lb (81.2 kg)   LMP 10/03/2005   BMI 28.89 kg/m    Physical Exam Vitals and nursing note reviewed.  Constitutional:      Appearance: She is well-developed.  HENT:     Head: Normocephalic and atraumatic.  Eyes:     Conjunctiva/sclera: Conjunctivae normal.  Cardiovascular:     Rate and Rhythm: Normal rate and regular rhythm.     Heart sounds: Normal heart sounds.  Pulmonary:     Effort: Pulmonary effort is normal.     Breath sounds: Normal breath sounds.  Abdominal:     General: Bowel sounds are normal.     Palpations: Abdomen is soft.   Musculoskeletal:     Cervical back: Normal range of motion.  Lymphadenopathy:     Cervical: No cervical adenopathy.  Skin:    General: Skin is warm and dry.     Capillary Refill: Capillary refill takes less than 2 seconds.  Neurological:     Mental Status: She is alert and oriented to person, place, and time.  Psychiatric:        Behavior: Behavior normal.      Musculoskeletal Exam: Cervical spine was in good range of motion.  Shoulder joints were in good range of motion with discomfort.  After joints and wrist joints with good range of motion.  She had tenderness over bilateral CMC joints.  Synovial thickening was noted over left second MCP joint.  PIP and DIP thickening was noted.  She had discomfort range of motion of bilateral hip joints.  There was no tenderness or warmth over her knee joints.  She has warmth and swelling over her right ankle joint.  CDAI Exam: CDAI Score: 2.9  Patient Global: 3 mm; Provider Global: 6 mm Swollen: 1 ; Tender: 6  Joint Exam 09/22/2022      Right  Left  TMJ   Tender     Glenohumeral   Tender   Tender  Hip   Tender   Tender  Ankle  Swollen Tender        Investigation: No additional findings.  Imaging: No results found.  Recent Labs: Lab Results  Component Value Date   WBC 7.5 07/12/2022   HGB 13.3 07/12/2022   PLT 283 07/12/2022   NA 139 07/12/2022   K 3.9 07/12/2022   CL 103 07/12/2022   CO2 28 07/12/2022   GLUCOSE 96 07/12/2022   BUN 21 07/12/2022   CREATININE 0.64 07/12/2022   BILITOT 0.4 07/12/2022   ALKPHOS 81 02/22/2022   AST 14 07/12/2022   ALT 22 07/12/2022   PROT 6.9 07/12/2022   ALBUMIN 4.7 02/22/2022   CALCIUM 9.7 07/12/2022   GFRAA 86 01/07/2021   QFTBGOLDPLUS NEGATIVE 09/12/2018    Speciality Comments: Prior therapy: methotrexate, remicade, orencia, and enbrel (inadequate response)  Procedures:  No procedures performed Allergies: Other and Contrast media [iodinated contrast media]   Assessment / Plan:      Visit Diagnoses: Rheumatoid arthritis involving multiple sites with positive rheumatoid factor ([place]) -patient complains of increased pain and discomfort over the last 2 months.  She states she has frequent flares and pain moves around.  She states recently she has had discomfort in her bilateral TMJs, bilateral shoulders, bilateral hands, bilateral hips and her right ankle joint.  The right ankle joint pain and swelling started yesterday.  She states she took several tablets of ibuprofen and the symptoms improved.  She continues to have some redness and warmth on palpation of her right ankle joint today.  She had painful range of motion of bilateral shoulders and bilateral hip joints.  She had some tenderness on palpation of her right TMJ.  I did detailed discussion with the patient regarding different treatment options.  She consumes alcohol on a regular basis.  She would prefer not to go on methotrexate as she had an adequate response to it in the past.  I discussed the option of adding leflunomide to Simponi or switching her to Actemra.  She would prefer to go on Actemra.  She would like to review the medication prior to her next appointment which is coming up in the next couple of weeks.  I gave her a handout on Actemra.  Indications side effects contraindications were discussed.  She also requested a prednisone taper due to ongoing pain and swelling in her right ankle joint.  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  Per patient's request after side effects were discussed a prednisone taper starting at 20 mg and taper by 5 mg every 4 days was given.    High risk medication use - Simponi 50 mg sq injections every 28 days.  Labs obtained on July 12, 2022 CBC and CMP were normal.  TB Gold was negative on July 27, 2021.  She was strongly advised to get repeat TB Gold.  She wants to get  it through health at work.  Labs are due in January 2024.  She was advised to get labs every 3 months.   Information on immunization was placed in the AVS.  She was also advised to hold Simponi if she develops an infection and resume after the infection resolves.  Chronic pain of both shoulders-she has been experiencing pain and discomfort in her bilateral shoulders.  She had painful range of motion.  Primary osteoarthritis of both hands-she complains of discomfort in her bilateral CMC joints.  CMC PIP and DIP thickening with no synovitis was noted.  Synovial thickening was noted over her left second MCP joint.  Right ankle swelling-she had warmth swelling and redness over her right ankle joint from recent flare which started yesterday.  She requested a prednisone taper which was sent as mentioned above.  Other fatigue-she has been experiencing increased fatigue.  She states she works long hours at [employer].  Sicca syndrome (HCC)-over-the-counter products were discussed.  Other medical problems are listed as follows:  History of vitamin D deficiency  History of hypertension-her blood pressure was normal today.  History of anxiety  Pulmonary nodule  History of shingles  History of bundle branch block  Orders: Orders Placed This Encounter  Procedures   XR Hand 2 View Right   XR Hand 2 View Left   XR Foot 2 Views Right   XR Foot 2 Views Left   Meds ordered this encounter  Medications   predniSONE (DELTASONE) 5 MG tablet    Sig: Take 4 tablets by mouth daily for 4 days, THEN 3 tablets daily for 4 days, THEN 2 tablets daily for 4 days, THEN 1 tablet  daily for 4 days.    Dispense:  40 tablet    Refill:  0    Follow-Up Instructions: Return in about 5 months (around 02/21/2023) for Rheumatoid arthritis, Osteoarthritis.   Bo Merino, MD  Note - This record has been created using Editor, commissioning.  Chart creation errors have been sought, but may not always  have been located. Such creation errors do not reflect on  the standard of medical care.

## 2022-09-22 NOTE — Patient Instructions (Addendum)
Tocilizumab Injection What is this medication? TOCILIZUMAB (TOE si LIZ ue mab) treats autoimmune conditions, such as arthritis. It works by slowing down an overactive immune system. It may also be used to treat severe COVID-19 in people who are hospitalized. It is a monoclonal antibody. This medicine may be used for other purposes; ask your health care provider or pharmacist if you have questions. COMMON BRAND NAME(S): Actemra What should I tell my care team before I take this medication? They need to know if you have any of these conditions: Cancer Diabetes Heart disease History of or current hepatitis B infection High blood pressure High cholesterol Immune system problems Infection Liver disease Low blood counts, such as low white cell, platelet, or red cell counts Multiple sclerosis Recent or upcoming vaccine Stomach or intestine problems Stroke An unusual or allergic reaction to tocilizumab, other medications, foods, dyes, or preservatives Pregnant or trying to get pregnant Breast-feeding How should I use this medication? This medication is injected into a vein or under the skin. It may be given by your care team in a hospital or clinic setting. It may also be given at home. If you get this medication at home, you will be taught how to prepare and give it. Use it exactly as directed. Take it as directed on the prescription label. Keep taking it unless your care team tells you to stop. If you use a pen, be sure to take off the outer needle cover before using the dose. It is important that you put your used needles and syringes in a special sharps container. Do not put them in a trash can. If you do not have a sharps container, call your pharmacist or care team to get one. A special MedGuide will be given to you by the pharmacist with each prescription and refill. Be sure to read this information carefully each time. Talk to your care team about the use of this medication in children.  While it may be prescribed for children as young as 2 years for selected conditions, precautions do apply. Overdosage: If you think you have taken too much of this medicine contact a poison control center or emergency room at once. NOTE: This medicine is only for you. Do not share this medicine with others. What if I miss a dose? If you get this medication at the hospital or clinic: It is important not to miss your dose. Call your care team if you are unable to keep an appointment. If you give yourself this medication at home: If you miss a dose, take it as soon as you can. If it is almost time for your next dose, take only that dose. Do not take double or extra doses. Call your care team with questions. What may interact with this medication? Do not take this medication with any of the following: Live virus vaccines This medication may also interact with the following: Biologic medications, such as abatacept, adalimumab, anakinra, certolizumab, etanercept, golimumab, infliximab, rituximab, secukinumab, ustekinumab Certain medications for cholesterol, such as atorvastatin, lovastatin, simvastatin Cyclosporine Estrogen or progestin hormones Omeprazole Steroid medications, such as prednisone or cortisone Theophylline Vaccines Warfarin This list may not describe all possible interactions. Give your health care provider a list of all the medicines, herbs, non-prescription drugs, or dietary supplements you use. Also tell them if you smoke, drink alcohol, or use illegal drugs. Some items may interact with your medicine. What should I watch for while using this medication? Visit your care team for regular checks on  your progress. Your condition will be monitored carefully while you are receiving this medication. Tell your care team if your symptoms do not start to get better or if they get worse. You may need blood work done while taking this medication. You will be tested for tuberculosis (TB) before  you start this medication. If your care team prescribes any medication for TB, you should start taking the TB medication before starting this medication. Make sure to finish the full course of TB medication. This medication may increase your risk of getting an infection. Call your care team for advice if you get a fever, chills, sore throat, or other symptoms of a cold or flu. Do not treat yourself. Try to avoid being around people who are sick. Talk to your care team about your risk of cancer. You may be more at risk for certain types of cancers if you take this medication. What side effects may I notice from receiving this medication? Side effects that you should report to your care team as soon as possible: Allergic reactions--skin rash, itching, hives, swelling of the face, lips, tongue, or throat Infection--fever, chills, cough, sore throat, wounds that don't heal, pain or trouble when passing urine, general feeling of discomfort or being unwell Liver injury--right upper belly pain, loss of appetite, nausea, light-colored stool, dark yellow or brown urine, yellowing skin or eyes, unusual weakness or fatigue Stomach pain that is severe, does not go away, or gets worse Unusual bruising or bleeding Side effects that usually do not require medical attention (report to your care team if they continue or are bothersome): Dizziness Headache Increase in blood pressure Pain, redness, or irritation at injection site Runny or stuffy nose Sore throat Stomach pain This list may not describe all possible side effects. Call your doctor for medical advice about side effects. You may report side effects to FDA at 1-800-FDA-1088. Where should I keep my medication? Keep out of the reach of children and pets. You will be instructed on how to store this medication. Get rid of any unused medication after the expiration date on the label. To get rid of medications that are no longer needed or have expired: Take  the medication to a medication take-back program. Check with your pharmacy or law enforcement to find a location. If you cannot return the medication, ask your pharmacist or care team how to get rid of this medication safely. NOTE: This sheet is a summary. It may not cover all possible information. If you have questions about this medicine, talk to your doctor, pharmacist, or health care provider.  2023 Elsevier/Gold Standard (2021-12-24 00:00:00)  Standing Labs We placed an order today for your standing lab work.   Please have your standing labs drawn in January and every 3 months  Please have your labs drawn 2 weeks prior to your appointment so that the provider can discuss your lab results at your appointment.  Please note that you may see your imaging and lab results in [place] before we have reviewed them. We will contact you once all results are reviewed. Please allow our office up to 72 hours to thoroughly review all of the results before contacting the office for clarification of your results.  Lab hours are:   Monday through Thursday from 8:00 am -12:30 pm and 1:00 pm-5:00 pm and Friday from 8:00 am-12:00 pm.  Please be advised, all patients with office appointments requiring lab work will take precedent over walk-in lab work.   Labs are drawn  by Margit Banda. Please bring your co-pay at the time of your lab draw.  You may receive a bill from [facility] for your lab work.  Please note if you are on Hydroxychloroquine and and an order has been placed for a Hydroxychloroquine level, you will need to have it drawn 4 hours or more after your last dose.  If you wish to have your labs drawn at another location, please call the office 24 hours in advance so we can fax the orders.  The office is located at [address], [place], [place], [place] [address] No appointment is necessary.    If you have any questions regarding directions or hours of operation,  please call [phone].   As a  reminder, please drink plenty of water prior to coming for your lab work. Thanks!   Vaccines You are taking a medication(s) that can suppress your immune system.  The following immunizations are recommended: Flu annually Covid-19  Td/Tdap (tetanus, diphtheria, pertussis) every 10 years Pneumonia (Prevnar 15 then Pneumovax 23 at least 1 year apart.  Alternatively, can take Prevnar 20 without needing additional dose) Shingrix: 2 doses from 4 weeks to 6 months apart  Please check with your PCP to make sure you are up to date.   If you have signs or symptoms of an infection or start antibiotics: First, call your PCP for workup of your infection. Hold your medication through the infection, until you complete your antibiotics, and until symptoms resolve if you take the following: Injectable medication (Actemra, Benlysta, Cimzia, Cosentyx, Enbrel, Humira, Kevzara, Orencia, Remicade, Simponi, Stelara, Taltz, Tremfya) Methotrexate Leflunomide (Arava) Mycophenolate (Cellcept) Morrie Sheldon, Olumiant, or Rinvoq

## 2022-09-23 ENCOUNTER — Encounter: Payer: Self-pay | Admitting: Rheumatology

## 2022-09-23 NOTE — Addendum Note (Signed)
Addended by: Shona Needles on: 09/23/2022 09:15 AM   Modules accepted: Orders

## 2022-09-29 NOTE — Progress Notes (Signed)
Chief Complaint:   OBESITY Sharon Hunter is here to discuss her progress with her obesity treatment plan along with follow-up of her obesity related diagnoses. Sharon Hunter is on keeping a food journal and adhering to recommended goals of [phone] calories and 80+ grams of protein and states she is following her eating plan approximately 70% of the time. Sharon Hunter states she is walking/hiking 30-60 minutes 2 times per week.  Today's visit was #: 9 Starting weight: 191 lbs Starting date: 02/22/2022 Today's weight: 180 lbs Today's date: 09/14/2022 Total lbs lost to date: 11 lbs Total lbs lost since last in-office visit: 0  Interim History: Sharon Hunter has done well overall with weight loss.  Breakfast-skips sometimes.  Lunch-salad with chicken or [place].  Dinner-sometimes gets.  Not meeting protein goals.  Drinking some wine/vodka tonics and plans to avoid alcohol for the next month.  Subjective:   1. Rheumatoid arthritis involving multiple sites with positive rheumatoid factor (Sharon Hunter) Sharon Hunter struggling lately with low back pain--Using a  TENs unit for low back pain and seems it is helping. Sees chiropractor normally.  2. Vitamin D deficiency Taking OTC Calcium combined with Vit D. Taking consistently now. Vit D level of 36.0 on 05/30/22--goal 50-70.  3. Vitamin B12 deficiency Sharon Hunter is taking multiple vitamin/B12 supplement over the counter daily. B12 level of 1,042 on 05/30/22.  4. Prediabetes Sharon Hunter's A1c at 5.8/insulin at 3.0 on 05/30/22. Working on decreasing simple carbs, eating plan to promote weight loss.  Assessment/Plan:   1. Rheumatoid arthritis involving multiple sites with positive rheumatoid factor (Sharon Hunter) Continue to follow up with Rheumatology.   2. Vitamin D deficiency Continue Vit D with Calcium supplement. Follow up with labs in 2-3 months.   3. Vitamin B12 deficiency Continue over the counter B12 supplement daily and healthy eating plan which is rich in B12 diet.  4. Prediabetes Continue  healthy eating plan and exercise to promote weight loss. Will follow up with labs in 2-3 months.  5. Obesity, Current BMI 29.1 Sharon Hunter is currently in the action stage of change. As such, her goal is to continue with weight loss efforts. She has agreed to keeping a food journal and adhering to recommended goals of 1000 calories and 80+ grams of protein daily.   Exercise goals: As is.  Behavioral modification strategies: increasing lean protein intake, decreasing simple carbohydrates, decreasing alcohol intake, no skipping meals, and holiday eating strategies .  Sharon Hunter has agreed to follow-up with our clinic in 4 weeks. She was informed of the importance of frequent follow-up visits to maximize her success with intensive lifestyle modifications for her multiple health conditions.   Objective:   Blood pressure 121/76, pulse (!) 53, temperature 98.1 F (36.7 C), height '5\' 6"'$  (1.676 m), weight 180 lb (81.6 kg), last menstrual period 10/03/2005, SpO2 100 %. Body mass index is 29.05 kg/m.  General: Cooperative, alert, well developed, in no acute distress. HEENT: Conjunctivae and lids unremarkable. Cardiovascular: Regular rhythm.  Lungs: Normal work of breathing. Neurologic: No focal deficits.   Lab Results  Component Value Date   CREATININE 0.64 07/12/2022   BUN 21 07/12/2022   NA 139 07/12/2022   K 3.9 07/12/2022   CL 103 07/12/2022   CO2 28 07/12/2022   Lab Results  Component Value Date   ALT 22 07/12/2022   AST 14 07/12/2022   ALKPHOS 81 02/22/2022   BILITOT 0.4 07/12/2022   Lab Results  Component Value Date   HGBA1C 5.8 (H) 05/30/2022   HGBA1C 5.7 (  H) 02/22/2022   Lab Results  Component Value Date   INSULIN 3.0 05/30/2022   INSULIN 11.3 02/22/2022   Lab Results  Component Value Date   TSH 0.753 02/22/2022   Lab Results  Component Value Date   CHOL 232 (H) 05/30/2022   HDL 97 05/30/2022   LDLCALC 118 (H) 05/30/2022   LDLDIRECT 132.6 05/23/2013   TRIG 102 05/30/2022    CHOLHDL 2.4 05/30/2022   Lab Results  Component Value Date   VD25OH 36.0 05/30/2022   VD25OH 31.0 02/22/2022   VD25OH 32 01/30/2018   Lab Results  Component Value Date   WBC 7.5 07/12/2022   HGB 13.3 07/12/2022   HCT 38.9 07/12/2022   MCV 92.8 07/12/2022   PLT 283 07/12/2022   No results found for: "IRON", "TIBC", "FERRITIN"  Attestation Statements:   Reviewed by clinician on day of visit: allergies, medications, problem list, medical history, surgical history, family history, social history, and previous encounter notes.  I, Brendell Tyus, am acting as transcriptionist for [employer], PA.  I have reviewed the above documentation for accuracy and completeness, and I agree with the above. -  Samier Jaco,PA-C

## 2022-10-04 ENCOUNTER — Other Ambulatory Visit: Payer: Self-pay

## 2022-10-04 NOTE — Telephone Encounter (Signed)
Please have [facility] discuss application process with the patient.  The plan is to apply for Actemra.

## 2022-10-04 NOTE — Progress Notes (Deleted)
Office Visit Note  Patient: Sharon Hunter             Date of Birth: 11-Jul-1961           MRN: [id number]             PCP: [name], DO Referring: [name], * Visit Date: 10/11/2022 Occupation: '@GUAROCC'$ @  Subjective:  No chief complaint on file.   History of Present Illness: Sharon Hunter is a 62 y.o. female ***     Activities of Daily Living:  Patient reports morning stiffness for *** {minute/hour:19697}.   Patient {ACTIONS;DENIES/REPORTS:21021675::"Denies"} nocturnal pain.  Difficulty dressing/grooming: {ACTIONS;DENIES/REPORTS:21021675::"Denies"} Difficulty climbing stairs: {ACTIONS;DENIES/REPORTS:21021675::"Denies"} Difficulty getting out of chair: {ACTIONS;DENIES/REPORTS:21021675::"Denies"} Difficulty using hands for taps, buttons, cutlery, and/or writing: {ACTIONS;DENIES/REPORTS:21021675::"Denies"}  No Rheumatology ROS completed.   PMFS History:  Patient Active Problem List   Diagnosis Date Noted   Mood disorder ([place])- Emotional Eating 08/02/2022   Class 1 obesity with serious comorbidity and body mass index (BMI) of 30.0 to 30.9 in adult 08/02/2022   At risk for impaired metabolic function 63/14/9702   Perioral dermatitis 05/26/2022   Preventative health care 05/26/2022   Essential hypertension 05/26/2022   Abnormal EKG 05/26/2022   Prediabetes 03/09/2022   Vitamin B12 deficiency 03/09/2022   Primary hypertension 02/22/2022   Hyperlipidemia 02/22/2022   GAD (generalized anxiety disorder) 02/22/2022   Vitamin D deficiency 02/22/2022   Ear pain, left 08/13/2021   History of anxiety 03/27/2017   History of hypertension 03/27/2017   History of bundle branch block 03/27/2017   Primary osteoarthritis of both hands 12/22/2016   History of shingles 08/02/2016   High risk medication use 08/02/2016   Hypertension 08/01/2016   IBS (irritable bowel syndrome) 08/01/2016   Anxiety 08/01/2016   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    LBBB (left bundle branch block) 01/30/2015   Palpitations 01/30/2015   Pulmonary nodule 07/04/2011   Murmur 02/03/2011   Rheumatoid arthritis ([place]) 02/03/2011   Hair loss 02/03/2011   Fatigue 02/03/2011    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   Hypertension    IBS (irritable bowel syndrome) 08/01/2016   Joint pain    Lactose intolerance    Migraine    Murmur 02/03/2011   Obese    Ovarian failure    Palpitation    Pulmonary nodule, left 2009   last CT Nov 2011-- due 08/2011   Rheumatoid arthritis(714.0)    in remission , off meds as of Summer 2012   Vitamin D deficiency     Family History  Problem Relation Age of Onset   Arthritis Mother    Uterine cancer Mother    Hypertension Mother    Diabetes Mother    Alcohol abuse Father    Hyperlipidemia Father    Heart disease Father    High blood pressure Father    Sleep apnea Father    Heart disease Sister        cardiac stent   Diabetes Sister    Hyperlipidemia Sister    Hypertension Sister    Alcohol abuse Brother    Diabetes Brother    Hypertension Brother    Cancer Brother        lung   Cancer Maternal Aunt        lung----non smoker   Past Surgical History:  Procedure Laterality Date   CHOLECYSTECTOMY  1999   COLONOSCOPY  10/31/11   EXPLORATORY LAPAROTOMY     TONSILLECTOMY  1969   WISDOM TOOTH EXTRACTION     Social History   Social History Narrative   Exercise-- jogs   Immunization History  Administered Date(s) Administered   Influenza Split 07/03/2013   Influenza,inj,Quad PF,6-35 Mos 07/17/2020   Influenza-Unspecified 07/03/2014, 07/04/2015   PFIZER(Purple Top)SARS-COV-2 Vaccination 01/13/2020, 05/15/2020   Tdap 12/01/2009     Objective: Vital Signs: LMP 10/03/2005    Physical Exam   Musculoskeletal Exam: ***  CDAI Exam: CDAI Score: -- Patient Global: --; Provider Global:  -- Swollen: --; Tender: -- Joint Exam 10/11/2022   No joint exam has been documented for this visit   There is currently no information documented on the homunculus. Go to the Rheumatology activity and complete the homunculus joint exam.  Investigation: No additional findings.  Imaging: No results found.  Recent Labs: Lab Results  Component Value Date   WBC 7.5 07/12/2022   HGB 13.3 07/12/2022   PLT 283 07/12/2022   NA 139 07/12/2022   K 3.9 07/12/2022   CL 103 07/12/2022   CO2 28 07/12/2022   GLUCOSE 96 07/12/2022   BUN 21 07/12/2022   CREATININE 0.64 07/12/2022   BILITOT 0.4 07/12/2022   ALKPHOS 81 02/22/2022   AST 14 07/12/2022   ALT 22 07/12/2022   PROT 6.9 07/12/2022   ALBUMIN 4.7 02/22/2022   CALCIUM 9.7 07/12/2022   GFRAA 86 01/07/2021   QFTBGOLDPLUS NEGATIVE 09/12/2018    Speciality Comments: Prior therapy: methotrexate, remicade, orencia, and enbrel (inadequate response)  Procedures:  No procedures performed Allergies: Other and Contrast media [iodinated contrast media]   Assessment / Plan:     Visit Diagnoses: No diagnosis found.  Orders: No orders of the defined types were placed in this encounter.  No orders of the defined types were placed in this encounter.   Face-to-face time spent with patient was *** minutes. Greater than 50% of time was spent in counseling and coordination of care.  Follow-Up Instructions: No follow-ups on file.   Earnestine Mealing, CMA  Note - This record has been created using Editor, commissioning.  Chart creation errors have been sought, but may not always  have been located. Such creation errors do not reflect on  the standard of medical care.

## 2022-10-07 ENCOUNTER — Telehealth: Payer: Self-pay | Admitting: Pharmacist

## 2022-10-07 ENCOUNTER — Other Ambulatory Visit (HOSPITAL_COMMUNITY): Payer: Self-pay

## 2022-10-07 NOTE — Telephone Encounter (Signed)
Received notification of Actemra new start via MyChart. I am unsure if consent was signed at last visit or to what extent counseling was provided.  Actemra dose for RA since weight < 100kg = '162mg'$  SQ every 14 days  Submitted a Prior Authorization request to [facility] for ACTEMRA via CoverMyMeds. Will update once we receive a response.  Key: [id number]   Sharon Hunter, PharmD, MPH, BCPS, CPP Clinical Pharmacist (Rheumatology and Pulmonology)

## 2022-10-07 NOTE — Telephone Encounter (Signed)
Verification of benefits for [place] submitted via [employer] protal

## 2022-10-10 ENCOUNTER — Other Ambulatory Visit (HOSPITAL_COMMUNITY): Payer: Self-pay

## 2022-10-11 ENCOUNTER — Ambulatory Visit: Payer: Commercial Managed Care - PPO | Admitting: Rheumatology

## 2022-10-11 ENCOUNTER — Other Ambulatory Visit (HOSPITAL_COMMUNITY): Payer: Self-pay

## 2022-10-11 DIAGNOSIS — R5383 Other fatigue: Secondary | ICD-10-CM

## 2022-10-11 DIAGNOSIS — M25471 Effusion, right ankle: Secondary | ICD-10-CM

## 2022-10-11 DIAGNOSIS — Z79899 Other long term (current) drug therapy: Secondary | ICD-10-CM

## 2022-10-11 DIAGNOSIS — G8929 Other chronic pain: Secondary | ICD-10-CM

## 2022-10-11 DIAGNOSIS — R911 Solitary pulmonary nodule: Secondary | ICD-10-CM

## 2022-10-11 DIAGNOSIS — Z8659 Personal history of other mental and behavioral disorders: Secondary | ICD-10-CM

## 2022-10-11 DIAGNOSIS — M0579 Rheumatoid arthritis with rheumatoid factor of multiple sites without organ or systems involvement: Secondary | ICD-10-CM

## 2022-10-11 DIAGNOSIS — M3500 Sicca syndrome, unspecified: Secondary | ICD-10-CM

## 2022-10-11 DIAGNOSIS — Z8619 Personal history of other infectious and parasitic diseases: Secondary | ICD-10-CM

## 2022-10-11 DIAGNOSIS — Z8639 Personal history of other endocrine, nutritional and metabolic disease: Secondary | ICD-10-CM

## 2022-10-11 DIAGNOSIS — Z8679 Personal history of other diseases of the circulatory system: Secondary | ICD-10-CM

## 2022-10-11 DIAGNOSIS — M19041 Primary osteoarthritis, right hand: Secondary | ICD-10-CM

## 2022-10-12 NOTE — Progress Notes (Signed)
Office Visit Note  Patient: Sharon Hunter             Date of Birth: 09-24-1961           MRN: [id number]             PCP: [name], DO Referring: [name], * Visit Date: 10/26/2022 Occupation: '@GUAROCC'$ @  Subjective:  Pain in multiple joints  History of Present Illness: SHEILIA REZNICK is a 62 y.o. female tree of seropositive rheumatoid arthritis and osteoarthritis.  She continues to have pain and discomfort in her right TMJ, bilateral shoulders, both hands, both ankles and her feet.  She started Actemra injection 9 days ago.  She has not noticed any improvement so far.  She continues to have morning stiffness and discomfort during routine activities.  She notices swelling over her hands, right ankle and left foot.    Activities of Daily Living:  Patient reports morning stiffness for 6 hours.   Patient Reports nocturnal pain.  Difficulty dressing/grooming: Reports Difficulty climbing stairs: Denies Difficulty getting out of chair: Denies Difficulty using hands for taps, buttons, cutlery, and/or writing: Reports  Review of Systems  Constitutional:  Positive for fatigue.  HENT:  Positive for mouth dryness. Negative for mouth sores.   Eyes:  Positive for dryness.  Respiratory:  Negative for shortness of breath.   Cardiovascular:  Negative for chest pain and palpitations.  Gastrointestinal:  Negative for blood in stool, constipation and diarrhea.  Endocrine: Negative for increased urination.  Genitourinary:  Negative for involuntary urination.  Musculoskeletal:  Positive for joint pain, joint pain, joint swelling and morning stiffness. Negative for gait problem, myalgias, muscle weakness, muscle tenderness and myalgias.  Skin:  Negative for color change, rash, hair loss and sensitivity to sunlight.  Allergic/Immunologic: Negative for susceptible to infections.  Neurological:  Negative for dizziness and headaches.  Hematological:  Positive for swollen glands.   Psychiatric/Behavioral:  Positive for sleep disturbance. Negative for depressed mood. The patient is not nervous/anxious.     PMFS History:  Patient Active Problem List   Diagnosis Date Noted   Mood disorder ([place])- Emotional Eating 08/02/2022   Class 1 obesity with serious comorbidity and body mass index (BMI) of 30.0 to 30.9 in adult 08/02/2022   At risk for impaired metabolic function 05/39/7673   Perioral dermatitis 05/26/2022   Preventative health care 05/26/2022   Essential hypertension 05/26/2022   Abnormal EKG 05/26/2022   Prediabetes 03/09/2022   Vitamin B12 deficiency 03/09/2022   Primary hypertension 02/22/2022   Hyperlipidemia 02/22/2022   GAD (generalized anxiety disorder) 02/22/2022   Vitamin D deficiency 02/22/2022   Ear pain, left 08/13/2021   History of anxiety 03/27/2017   History of hypertension 03/27/2017   History of bundle branch block 03/27/2017   Primary osteoarthritis of both hands 12/22/2016   History of shingles 08/02/2016   High risk medication use 08/02/2016   Hypertension 08/01/2016   IBS (irritable bowel syndrome) 08/01/2016   Anxiety 08/01/2016   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   LBBB (left bundle branch block) 01/30/2015   Palpitations 01/30/2015   Pulmonary nodule 07/04/2011   Murmur 02/03/2011   Rheumatoid arthritis ([place]) 02/03/2011   Hair loss 02/03/2011   Fatigue 02/03/2011    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   Hypertension    IBS (irritable  bowel syndrome) 08/01/2016   Joint pain    Lactose intolerance    Migraine    Murmur 02/03/2011   Obese    Ovarian failure    Palpitation    Pulmonary nodule, left 2009   last CT Nov 2011-- due 08/2011   Rheumatoid arthritis(714.0)    in remission , off meds as of Summer 2012   Vitamin D deficiency     Family History  Problem Relation Age of Onset    Arthritis Mother    Uterine cancer Mother    Hypertension Mother    Diabetes Mother    Alcohol abuse Father    Hyperlipidemia Father    Heart disease Father    High blood pressure Father    Sleep apnea Father    Heart disease Sister        cardiac stent   Diabetes Sister    Hyperlipidemia Sister    Hypertension Sister    Alcohol abuse Brother    Diabetes Brother    Hypertension Brother    Cancer Brother        lung   Cancer Maternal Aunt        lung----non smoker   Past Surgical History:  Procedure Laterality Date   CHOLECYSTECTOMY  1999   COLONOSCOPY  10/31/11   EXPLORATORY LAPAROTOMY     TONSILLECTOMY  1969   WISDOM TOOTH EXTRACTION     Social History   Social History Narrative   Exercise-- jogs   Immunization History  Administered Date(s) Administered   Influenza Split 07/03/2013   Influenza,inj,Quad PF,6-35 Mos 07/17/2020   Influenza-Unspecified 07/03/2014, 07/04/2015   PFIZER(Purple Top)SARS-COV-2 Vaccination 01/13/2020, 05/15/2020   Tdap 12/01/2009     Objective: Vital Signs: BP 137/82 (BP Location: Left Arm, Patient Position: Sitting, Cuff Size: Normal)   Pulse (!) 54   Resp 16   Ht 5' 6.5" (1.689 m)   Wt 176 lb (79.8 kg) Comment: per patient  LMP 10/03/2005   BMI 27.98 kg/m    Physical Exam Vitals and nursing note reviewed.  Constitutional:      Appearance: She is well-developed.  HENT:     Head: Normocephalic and atraumatic.  Eyes:     Conjunctiva/sclera: Conjunctivae normal.  Cardiovascular:     Rate and Rhythm: Normal rate and regular rhythm.     Heart sounds: Normal heart sounds.  Pulmonary:     Effort: Pulmonary effort is normal.     Breath sounds: Normal breath sounds.  Abdominal:     General: Bowel sounds are normal.     Palpations: Abdomen is soft.  Musculoskeletal:     Cervical back: Normal range of motion.  Lymphadenopathy:     Cervical: No cervical adenopathy.  Skin:    General: Skin is warm and dry.     Capillary Refill:  Capillary refill takes less than 2 seconds.  Neurological:     Mental Status: She is alert and oriented to person, place, and time.  Psychiatric:        Behavior: Behavior normal.      Musculoskeletal Exam: Cervical spine was in good range of motion.  Shoulder joints, elbow joints, wrist joints, MCPs PIPs and DIPs been good range of motion.  She had tenderness on palpation of her right TMJ.  She had synovitis over the right first PIP joint.  She has synovial thickening or right second and third MCP joints.  Bilateral PIP and DIP thickening was noted.  Hip joints and knee joints were in  good range of motion without any warmth swelling or effusion.  She had tenderness on palpation over right ankle joint with some warmth.  She had bilateral bunions and bilateral fifth MTP thickening.  She had tenderness over left first MTP.  CDAI Exam: CDAI Score: 5.2  Patient Global: 6 mm; Provider Global: 6 mm Swollen: 2 ; Tender: 6  Joint Exam 10/26/2022      Right  Left  TMJ   Tender     Glenohumeral   Tender   Tender  MCP 1  Swollen Tender     Ankle  Swollen Tender     MTP 1      Tender     Investigation: No additional findings.  Imaging: No results found.  Recent Labs: Lab Results  Component Value Date   WBC 7.5 07/12/2022   HGB 13.3 07/12/2022   PLT 283 07/12/2022   NA 139 07/12/2022   K 3.9 07/12/2022   CL 103 07/12/2022   CO2 28 07/12/2022   GLUCOSE 96 07/12/2022   BUN 21 07/12/2022   CREATININE 0.64 07/12/2022   BILITOT 0.4 07/12/2022   ALKPHOS 81 02/22/2022   AST 14 07/12/2022   ALT 22 07/12/2022   PROT 6.9 07/12/2022   ALBUMIN 4.7 02/22/2022   CALCIUM 9.7 07/12/2022   GFRAA 86 01/07/2021   QFTBGOLDPLUS NEGATIVE 09/12/2018    Speciality Comments: TB Gold: 07/25/2022 Neg  Prior therapy: methotrexate, remicade, orencia, and enbrel (inadequate response), Simponi stopped Dec 2023, Actemra started 10/17/22  Procedures:  No procedures performed Allergies: Other and  Contrast media [iodinated contrast media]   Assessment / Plan:     Visit Diagnoses: Rheumatoid arthritis involving multiple sites with positive rheumatoid factor (HCC)-patient is started to Actemra last subcutaneous injection on October 17, 2022.  Patient had no side effects from Actemra.  She has not noticed any improvement in her joint symptoms.  She continues to have pain and swelling in multiple joints.  I did detailed discussion with the patient that we will have to give some more time to Actemra.  Will see her back in 2 months to see the response to Actemra.  High risk medication use -she is currently on Actemra 162 mg per 0.9 ML subcu every 14 days.  Simponi was discontinued due to inadequate response.  She failed methotrexate, Remicade, Orencia and Enbrel in the past.  She was advised to get labs in a month, then every 3 months.  Information on immunization was placed in the AVS.  She was advised to hold Actemra if she develops an infection and resume after the infection resolves.  Chronic pain of both shoulders-she continues to have discomfort in her bilateral shoulders.  Primary osteoarthritis of both hands -she has rheumatoid arthritis and osteoarthritis overlap with synovitis in her hands and PIP and DIP thickening as described above.  Plan: XR Hand 2 View Right, XR Hand 2 View Left x-rays were consistent with rheumatoid arthritis and osteoarthritis overlap.  Pain in both feet -she had warmth on palpation of her right ankle.  She had tenderness over some of the MTPs as described above.  Plan: XR Foot 2 Views Right, XR Foot 2 Views Left.  X-rays were consistent with rheumatoid arthritis and osteoarthritis overlap.  Possible erosive changes were noted in the right fifth MTP joint.  Comparison films were available.  Right ankle swelling-she continues to have warmth and swelling in her right ankle joint.  Other fatigue-related to chronic disease.  Sicca syndrome (HCC)-over-the-counter  product  were discussed.  History of vitamin D deficiency-vitamin D was normal at 36 on May 30, 2022.  Other medical problems are listed as follows:  History of anxiety  History of hypertension  Pulmonary nodule  History of shingles  History of bundle branch block  Orders: Orders Placed This Encounter  Procedures   XR Hand 2 View Right   XR Hand 2 View Left   XR Foot 2 Views Right   XR Foot 2 Views Left   No orders of the defined types were placed in this encounter.    Follow-Up Instructions: Return in about 2 months (around 12/25/2022) for Rheumatoid arthritis, Osteoarthritis.   Bo Merino, MD  Note - This record has been created using Editor, commissioning.  Chart creation errors have been sought, but may not always  have been located. Such creation errors do not reflect on  the standard of medical care.

## 2022-10-13 ENCOUNTER — Ambulatory Visit (INDEPENDENT_AMBULATORY_CARE_PROVIDER_SITE_OTHER): Payer: Commercial Managed Care - PPO | Admitting: Family Medicine

## 2022-10-13 ENCOUNTER — Encounter (INDEPENDENT_AMBULATORY_CARE_PROVIDER_SITE_OTHER): Payer: Self-pay | Admitting: Family Medicine

## 2022-10-13 ENCOUNTER — Other Ambulatory Visit (HOSPITAL_COMMUNITY): Payer: Self-pay

## 2022-10-13 VITALS — BP 144/78 | HR 61 | Temp 97.9°F | Ht 66.0 in | Wt 176.0 lb

## 2022-10-13 DIAGNOSIS — Z6828 Body mass index (BMI) 28.0-28.9, adult: Secondary | ICD-10-CM

## 2022-10-13 DIAGNOSIS — E559 Vitamin D deficiency, unspecified: Secondary | ICD-10-CM | POA: Diagnosis not present

## 2022-10-13 DIAGNOSIS — R7303 Prediabetes: Secondary | ICD-10-CM

## 2022-10-13 DIAGNOSIS — E669 Obesity, unspecified: Secondary | ICD-10-CM

## 2022-10-13 DIAGNOSIS — I10 Essential (primary) hypertension: Secondary | ICD-10-CM | POA: Diagnosis not present

## 2022-10-13 NOTE — Telephone Encounter (Signed)
Received notification from [facility] regarding a prior authorization for ACTEMRA. Authorization has been APPROVED from 10/11/2022 to 04/09/2023. Approval letter sent to scan center.  Per test claim, copay for 28 days supply is $692  Patient must fill through [place]: [phone]   Authorization # [id number]  Enrolled patient into [place] card ID: [id number] Group: [id number] BIN: [id number] PCN: [id number]  Copay card company phone # [phone]  Patient can be started on Actemra. MyChart message sent to pt.  Knox Saliva, PharmD, MPH, BCPS, CPP Clinical Pharmacist (Rheumatology and Pulmonology)

## 2022-10-14 ENCOUNTER — Other Ambulatory Visit (HOSPITAL_COMMUNITY): Payer: Self-pay

## 2022-10-14 NOTE — Telephone Encounter (Signed)
Pt ready for scheduling on or after 10/14/22  Out-of-pocket cost due at time of visit: $100.00  Primary: [employer] [place] co-insurance: $50.00 Admin fee co-insurance: $50.00  Secondary:  [place] co-insurance:  Admin fee co-insurance:   Deductible: $0 met of $500 required  Prior Auth: Per [employer], no pre-authorization needed PA# Valid:    ** This summary of benefits is an estimation of the patient's out-of-pocket cost. Exact cost may vary based on individual plan coverage.

## 2022-10-17 ENCOUNTER — Ambulatory Visit: Payer: Commercial Managed Care - PPO | Attending: Rheumatology | Admitting: Pharmacist

## 2022-10-17 ENCOUNTER — Other Ambulatory Visit (HOSPITAL_COMMUNITY): Payer: Self-pay

## 2022-10-17 DIAGNOSIS — Z79899 Other long term (current) drug therapy: Secondary | ICD-10-CM

## 2022-10-17 DIAGNOSIS — M0579 Rheumatoid arthritis with rheumatoid factor of multiple sites without organ or systems involvement: Secondary | ICD-10-CM

## 2022-10-17 DIAGNOSIS — Z5181 Encounter for therapeutic drug level monitoring: Secondary | ICD-10-CM

## 2022-10-17 DIAGNOSIS — Z7189 Other specified counseling: Secondary | ICD-10-CM

## 2022-10-17 MED ORDER — ACTEMRA ACTPEN 162 MG/0.9ML ~~LOC~~ SOAJ
162.0000 mg | SUBCUTANEOUS | 2 refills | Status: DC
  Filled 2022-10-17: qty 1.8, fill #0
  Filled 2022-10-18: qty 1.8, 28d supply, fill #0
  Filled 2022-11-17: qty 1.8, 28d supply, fill #1
  Filled 2022-12-15: qty 1.8, 28d supply, fill #2

## 2022-10-17 NOTE — Patient Instructions (Addendum)
Your next ACTEMRA dose is due on 10/31/2022, 11/14/2022, and every 14 days thereafter  Please be advised we will monitor your cholesterol panel. Consider adding statin treatment for cholesterol management (to discuss with your weight loss clinic)  HOLD ACTEMRA if you have signs or symptoms of an infection. You can resume once you feel better or back to your baseline. HOLD ACTEMRA if you start antibiotics to treat an infection. HOLD ACTEMRA around the time of surgery/procedures. Your surgeon will be able to provide recommendations on when to hold BEFORE and when you are cleared to Day.  Pharmacy information: Your prescription will be shipped from [facility]. Their phone number is [phone] [facility] or [facility] will call you to schedule the first shipment to your home. After that hte pharmacy staff will call you directly. They will mail your medication to your home.  Labs are due in 1 month then every 3 months. Lab hours are from Monday to Thursday 8am-12:30pm and 1pm-5pm and Friday 8am-12pm. You do not need an appointment if you come for labs during these times.    How to manage an injection site reaction: Remember the 5 C's: COUNTER - leave on the counter at least 30 minutes but up to overnight to bring medication to room temperature. This may help prevent stinging COLD - place something cold (like an ice gel pack or cold water bottle) on the injection site just before cleansing with alcohol. This may help reduce pain CLARITIN - use Claritin (generic name is loratadine) for the first two weeks of treatment or the day of, the day before, and the day after injecting. This will help to minimize injection site reactions CORTISONE CREAM - apply if injection site is irritated and itching CALL ME - if injection site reaction is bigger than the size of your fist, looks infected, blisters, or if you develop hives

## 2022-10-17 NOTE — Progress Notes (Signed)
Pharmacy Note Subjective: Patient presents today to the [facility] for Actemra new start for rheumatoid arthritis. She is transitioning from [place]. Her last dose of Simponi was about 6 weeks ago per her recollection. She states she received Simponi dose at home but did not take the dose.  History of hyperlipidemia: Yes. Not currently on statin.  History of diverticulitis: No  Patient running a fever or have signs/symptoms of infection? No  Patient currently on antibiotics for the treatment of infection? No  Patient have any upcoming invasive procedures/surgeries? No   Objective: CBC    Component Value Date/Time   WBC 7.5 07/12/2022 1453   RBC 4.19 07/12/2022 1453   HGB 13.3 07/12/2022 1453   HGB 13.8 02/22/2022 0949   HCT 38.9 07/12/2022 1453   HCT 42.3 02/22/2022 0949   PLT 283 07/12/2022 1453   PLT 289 02/22/2022 0949   MCV 92.8 07/12/2022 1453   MCV 93 02/22/2022 0949   MCH 31.7 07/12/2022 1453   MCHC 34.2 07/12/2022 1453   RDW 12.2 07/12/2022 1453   RDW 12.5 02/22/2022 0949   LYMPHSABS 2,693 07/12/2022 1453   LYMPHSABS 2.4 02/22/2022 0949   MONOABS 856 03/27/2017 1341   EOSABS 128 07/12/2022 1453   EOSABS 0.1 02/22/2022 0949   BASOSABS 38 07/12/2022 1453   BASOSABS 0.0 02/22/2022 0949    CMP     Component Value Date/Time   NA 139 07/12/2022 1453   NA 139 02/22/2022 0949   K 3.9 07/12/2022 1453   CL 103 07/12/2022 1453   CO2 28 07/12/2022 1453   GLUCOSE 96 07/12/2022 1453   BUN 21 07/12/2022 1453   BUN 14 02/22/2022 0949   CREATININE 0.64 07/12/2022 1453   CALCIUM 9.7 07/12/2022 1453   PROT 6.9 07/12/2022 1453   PROT 7.1 02/22/2022 0949   ALBUMIN 4.7 02/22/2022 0949   AST 14 07/12/2022 1453   ALT 22 07/12/2022 1453   ALKPHOS 81 02/22/2022 0949   BILITOT 0.4 07/12/2022 1453   BILITOT 0.6 02/22/2022 0949   GFRNONAA 74 01/07/2021 1335   GFRAA 86 01/07/2021 1335     Baseline Immunosuppressant Therapy Labs     Latest Ref Rng & Units  09/12/2018    2:17 PM  Quantiferon TB Gold  Quantiferon TB Gold Plus NEGATIVE NEGATIVE    Negative on 07/25/2022    No results found for: "HIV"        Latest Ref Rng & Units 07/12/2022    2:53 PM  Serum Protein Electrophoresis  Total Protein 6.1 - 8.1 g/dL 6.9     No results found for: "G6PDH"  No results found for: "TPMT"   Lipid Panel Lab Results  Component Value Date   CHOL 232 (H) 05/30/2022   HDL 97 05/30/2022   LDLCALC 118 (H) 05/30/2022   LDLDIRECT 132.6 05/23/2013   TRIG 102 05/30/2022   CHOLHDL 2.4 05/30/2022     Chest x-ray: 10/29/2019 - Elevation of the left hemidiaphragm without acute abnormality of the lungs.  Assessment/Plan:  Counseled patient that Actemra is an IL-6 blocking agent.  Counseled patient on purpose, proper use, and adverse effects of Actemra.  Reviewed the most common adverse effects including infections, infusion reactions, bowel injury, and rarely cancer and conditions of the nervous system.  Reviewed risk of lipid abnormalities and elevated liver enzymes. Discussed that there is the possibility of an increased risk of malignancy but it is not well understood if this increased risk is due to the medication or  the disease state. Counseled patient that Actemra should be held prior to scheduled surgery for infections or new antibiotic use. Counseled patient that Actemra should be held prior to scheduled surgery. Recommend annual influenza, PCV 15 or PCV20 or Pneumovax 23, and Shingrix as indicated.  Reviewed the importance of regular labs while on Actemra therapy including the need for routine lipid panel.  Will recheck lipid panel after 4-8 weeks of starting Actemra, then every 6 months routinely. CBC and CMP will be monitored every 3 months. TB gold will be monitored annually. Standing orders placed.  Provided patient with medication education material and answered all questions.  Patient voiced understanding.  Patient consented to Actemra.  Will  upload consent into the media tab.  Reviewed storage instructions of Actemra with patient.    Demonstrated proper injection technique with ACTEMRA ACTPEN demo device  Patient able to demonstrate proper injection technique using the teach back method.  Patient self injected in the left upper thigh with:  Sample Medication: ACTEMRA ACTPEN '162MG'$ /0.9ML INJECTOR NDC: [address]-23 Lot: [id number] Expiration: 09/2023  Patient tolerated well.  Observed for 30 mins in office for adverse reaction and none noted. Patient denies itchiness and irritation.   Patient is to return in 1 month for labs and 6-8 weeks for follow-up appointment.  Standing orders placed for CBC w diff, CMP w GFR, lipid panel.  ACTEMRA approved through insurance .   Rx sent to: [facility]: [phone] .  Patietn advised that either [facility] or [facility] will reach out to schedule first shipment to home. Patient provided with pharmacy phone number and advised that subsequent shipments will be set up specialty pharmacy staff  Patient will continue ACTEMRA '162MG'$  every 14 days as monotherapy  All questions encouraged and answered.  Instructed patient to call with any further questions or concerns.  Knox Saliva, PharmD, MPH, BCPS, CPP Clinical Pharmacist (Rheumatology and Pulmonology)  10/17/2022 8:56 AM

## 2022-10-17 NOTE — Telephone Encounter (Signed)
Patient r/s Actemra new start for 10/17/2022  Knox Saliva, PharmD, MPH, BCPS, CPP Clinical Pharmacist (Rheumatology and Pulmonology)

## 2022-10-18 ENCOUNTER — Other Ambulatory Visit (HOSPITAL_COMMUNITY): Payer: Self-pay

## 2022-10-18 ENCOUNTER — Other Ambulatory Visit: Payer: Self-pay

## 2022-10-20 ENCOUNTER — Ambulatory Visit: Payer: 59 | Admitting: Cardiovascular Disease

## 2022-10-20 ENCOUNTER — Ambulatory Visit: Payer: Commercial Managed Care - PPO | Admitting: Pharmacist

## 2022-10-25 ENCOUNTER — Other Ambulatory Visit (HOSPITAL_COMMUNITY): Payer: Self-pay

## 2022-10-26 ENCOUNTER — Ambulatory Visit (INDEPENDENT_AMBULATORY_CARE_PROVIDER_SITE_OTHER): Payer: Commercial Managed Care - PPO

## 2022-10-26 ENCOUNTER — Other Ambulatory Visit (HOSPITAL_COMMUNITY): Payer: Self-pay

## 2022-10-26 ENCOUNTER — Encounter: Payer: Self-pay | Admitting: Rheumatology

## 2022-10-26 ENCOUNTER — Ambulatory Visit: Payer: Commercial Managed Care - PPO | Attending: Rheumatology | Admitting: Rheumatology

## 2022-10-26 ENCOUNTER — Ambulatory Visit: Payer: Commercial Managed Care - PPO

## 2022-10-26 ENCOUNTER — Encounter: Payer: Self-pay | Admitting: Cardiovascular Disease

## 2022-10-26 VITALS — BP 137/82 | HR 54 | Resp 16 | Ht 66.5 in | Wt 176.0 lb

## 2022-10-26 DIAGNOSIS — M25471 Effusion, right ankle: Secondary | ICD-10-CM | POA: Diagnosis not present

## 2022-10-26 DIAGNOSIS — M79672 Pain in left foot: Secondary | ICD-10-CM

## 2022-10-26 DIAGNOSIS — Z79899 Other long term (current) drug therapy: Secondary | ICD-10-CM | POA: Diagnosis not present

## 2022-10-26 DIAGNOSIS — Z8619 Personal history of other infectious and parasitic diseases: Secondary | ICD-10-CM

## 2022-10-26 DIAGNOSIS — M0579 Rheumatoid arthritis with rheumatoid factor of multiple sites without organ or systems involvement: Secondary | ICD-10-CM | POA: Diagnosis not present

## 2022-10-26 DIAGNOSIS — M19041 Primary osteoarthritis, right hand: Secondary | ICD-10-CM | POA: Diagnosis not present

## 2022-10-26 DIAGNOSIS — Z8639 Personal history of other endocrine, nutritional and metabolic disease: Secondary | ICD-10-CM

## 2022-10-26 DIAGNOSIS — M19042 Primary osteoarthritis, left hand: Secondary | ICD-10-CM

## 2022-10-26 DIAGNOSIS — M79671 Pain in right foot: Secondary | ICD-10-CM

## 2022-10-26 DIAGNOSIS — G8929 Other chronic pain: Secondary | ICD-10-CM

## 2022-10-26 DIAGNOSIS — Z8679 Personal history of other diseases of the circulatory system: Secondary | ICD-10-CM

## 2022-10-26 DIAGNOSIS — M25511 Pain in right shoulder: Secondary | ICD-10-CM

## 2022-10-26 DIAGNOSIS — Z8659 Personal history of other mental and behavioral disorders: Secondary | ICD-10-CM | POA: Diagnosis not present

## 2022-10-26 DIAGNOSIS — M25512 Pain in left shoulder: Secondary | ICD-10-CM

## 2022-10-26 DIAGNOSIS — R5383 Other fatigue: Secondary | ICD-10-CM | POA: Diagnosis not present

## 2022-10-26 DIAGNOSIS — R911 Solitary pulmonary nodule: Secondary | ICD-10-CM

## 2022-10-26 DIAGNOSIS — M3500 Sicca syndrome, unspecified: Secondary | ICD-10-CM | POA: Diagnosis not present

## 2022-10-26 NOTE — Patient Instructions (Signed)
Standing Labs We placed an order today for your standing lab work.   Please have your standing labs drawn in February and every 3 months  Please have your labs drawn 2 weeks prior to your appointment so that the provider can discuss your lab results at your appointment.  Please note that you may see your imaging and lab results in [place] before we have reviewed them. We will contact you once all results are reviewed. Please allow our office up to 72 hours to thoroughly review all of the results before contacting the office for clarification of your results.  Lab hours are:   Monday through Thursday from 8:00 am -12:30 pm and 1:00 pm-5:00 pm and Friday from 8:00 am-12:00 pm.  Please be advised, all patients with office appointments requiring lab work will take precedent over walk-in lab work.   Labs are drawn by [facility]. Please bring your co-pay at the time of your lab draw.  You may receive a bill from [facility] for your lab work.  Please note if you are on Hydroxychloroquine and and an order has been placed for a Hydroxychloroquine level, you will need to have it drawn 4 hours or more after your last dose.  If you wish to have your labs drawn at another location, please call the office 24 hours in advance so we can fax the orders.  The office is located at [address], [place], [place], [place] [address] No appointment is necessary.    If you have any questions regarding directions or hours of operation,  please call [phone].   As a reminder, please drink plenty of water prior to coming for your lab work. Thanks!   Vaccines You are taking a medication(s) that can suppress your immune system.  The following immunizations are recommended: Flu annually Covid-19  RSV Td/Tdap (tetanus, diphtheria, pertussis) every 10 years Pneumonia (Prevnar 15 then Pneumovax 23 at least 1 year apart.  Alternatively, can take Prevnar 20 without needing additional dose) Shingrix: 2 doses from 4  weeks to 6 months apart If you have signs or symptoms of an infection or start antibiotics: First, call your PCP for workup of your infection. Hold your medication through the infection, until you complete your antibiotics, and until symptoms resolve if you take the following: Injectable medication (Actemra, Benlysta, Cimzia, Cosentyx, Enbrel, Humira, Kevzara, Orencia, Remicade, Simponi, Stelara, Taltz, Tremfya) Methotrexate Leflunomide (Arava) Mycophenolate (Cellcept) Roma Kayser, or Rinvoq   Please check with your PCP to make sure you are up to date.

## 2022-10-26 NOTE — Progress Notes (Unsigned)
Cardiology Office Note:    Date:  10/27/2022   ID:  Sharon Hunter, DOB 07-14-1961, MRN [id number]  PCP:  Carollee Herter, [facility] Cardiologist:  Markevius Trombetta    Referring MD: Carollee Herter, Alferd Apa, *   Chief Complaint  Patient presents with   Hypertension        left bundle branch block    History of Present Illness:    Sharon Hunter is a 62 y.o. female with a hx of hyperlipidemia and LBBB, HTN   I saw Denice Paradise several years ago for rate related LBBB  Has a hx of HTN and HLD   Echo from Sept. 8, 2023  reveals nl LV function, mild MR   Hiking some ,  walking some  Needs to get more .   Father developed CAD in his 84s Sister developed CAD in her 72 s   Has some DOE with exertion  Ie walking into work .  Not doing spin class or other   Had an allergic response to IV contrast ( 40 years ago )  Eats seafood all the time  Will premedicate prior to coronary CTA   October 27, 2022: Sharon Hunter  is seen today for follow-up visit.  She has a history of left bundle branch block.  She has a history of hypertension and hyperlipidemia. We originally had scheduled her for a coronary CT angiogram last year but it turned out it was going to be too expensive for her and we agreed to postpone that test.  She did have an echocardiogram on June 10, 2022.  This revealed a normal LVEF of 60 to 65%.  Her diastolic parameters were normal. She has mild mitral regurgitation.  Still having lots of issues with her RA .  Trying new meds.   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   Hypertension     IBS (irritable bowel syndrome) 08/01/2016   Joint pain    Lactose intolerance    Migraine    Murmur 02/03/2011   Obese    Ovarian failure    Palpitation    Pulmonary nodule, left 2009   last CT Nov 2011-- due 08/2011   Rheumatoid arthritis(714.0)    in remission , off meds as of Summer 2012   Vitamin D deficiency     Past Surgical History:  Procedure Laterality Date   CHOLECYSTECTOMY  1999   COLONOSCOPY  10/31/11   EXPLORATORY LAPAROTOMY     TONSILLECTOMY  1969   WISDOM TOOTH EXTRACTION      Current Medications: Current Meds  Medication Sig   amLODipine (NORVASC) 5 MG tablet Take 1 tablet (5 mg total) by mouth daily.   Cholecalciferol 125 MCG (5000 UT) CHEW Chew 1 tablet by mouth daily at 6 (six) AM.   cycloSPORINE, PF, (CEQUA) 0.09 % SOLN Place 1 drop into both eyes 2 (two) times daily.   diclofenac Sodium (VOLTAREN) 1 % GEL Apply 2-4 grams to affected area up to  4 times daily.   Doxylamine Succinate, Sleep, (UNISOM PO) Take 1 tablet by mouth at bedtime.   EYSUVIS 0.25 % SUSP Instill 1 drop in both eyes 4 times a day for 7 days, then 2 times a day for 7 days, then stop   hydrochlorothiazide (MICROZIDE) 12.5 MG capsule Take 1 capsule (12.5 mg total) by mouth daily.   Ibuprofen (MOTRIN PO) Take by mouth as needed.   ipratropium (ATROVENT) 0.03 % nasal spray Place 2 sprays into both nostrils every 12 (twelve) hours. (Patient taking differently: Place 2 sprays into both nostrils as needed.)   MELATONIN PO Take 5-10 mg by mouth at bedtime.   Multiple Vitamin (MULTIVITAMIN PO) Take by mouth daily.   Prasterone (INTRAROSA) 6.5 MG INST    sertraline (ZOLOFT) 50 MG tablet Take 1 tablet (50 mg total) by mouth daily.   Tocilizumab (ACTEMRA ACTPEN) 162 MG/0.9ML SOAJ Inject 162 mg into the skin every 14 (fourteen) days.   valsartan (DIOVAN) 80 MG tablet Take 1 tablet (80 mg total) by mouth daily.   [DISCONTINUED] hydrochlorothiazide (HYDRODIURIL) 25 MG tablet Take 1 tablet (25 mg  total) by mouth daily. (Patient taking differently: Take 12.5 mg by mouth daily.)     Allergies:   Other and Contrast media [iodinated contrast media]   Social History   Socioeconomic History   Marital status: Single    Spouse name: Not on file   Number of children: 1   Years of education: 17   Highest education level: Not on file  Occupational History   Occupation: RN  Tobacco Use   Smoking status: Former    Types: Cigarettes    Quit date: 10/03/1990    Years since quitting: 32.0    Passive exposure: Past   Smokeless tobacco: Never  Vaping Use   Vaping Use: Never used  Substance and Sexual Activity   Alcohol use: Yes    Alcohol/week: 3.0 standard drinks of alcohol    Types: 3 Standard drinks or equivalent per week    Comment: Red wine 4 oz occ   Drug use: No   Sexual activity: Not Currently    Partners: Male  Other Topics Concern   Not on file  Social History Narrative   Exercise-- jogs   Social Determinants of Health   Financial Resource Strain: Not on file  Food Insecurity: Not on file  Transportation Needs: Not on file  Physical Activity: Not on file  Stress: Not on file  Social Connections: Not on file     Family History: The patient's family history includes Alcohol abuse in her brother and father; Arthritis in her mother; Cancer in her brother and maternal aunt; Diabetes in her brother, mother, and sister; Heart disease in her father and sister; High blood pressure in her father; Hyperlipidemia in her father and sister; Hypertension in her brother, mother, and sister; Sleep apnea in her father; Uterine cancer in her mother.  ROS:   Please see the history of present illness.     All other systems reviewed and are negative.  EKGs/Labs/Other Studies Reviewed:    The following studies were reviewed today:   EKG:     Recent Labs: 02/22/2022: TSH 0.753 07/12/2022: ALT 22; BUN 21; Creat 0.64; Hemoglobin 13.3; Platelets 283; Potassium 3.9; Sodium 139   Recent Lipid Panel    Component Value Date/Time   CHOL 232 (H) 05/30/2022 1503   TRIG 102 05/30/2022 1503   HDL 97 05/30/2022 1503   CHOLHDL 2.4 05/30/2022 1503  CHOLHDL 3 10/29/2021 0950   VLDL 20.0 10/29/2021 0950   LDLCALC 118 (H) 05/30/2022 1503   LDLCALC 133 (H) 01/30/2018 1545   LDLDIRECT 132.6 05/23/2013 1141     Risk Assessment/Calculations:      HYPERTENSION CONTROL Vitals:   10/27/22 1105  BP: (!) 140/88    The patient's blood pressure is elevated above target today.  In order to address the patient's elevated BP: A new medication was prescribed today.            Physical Exam:    Physical Exam: Blood pressure (!) 140/88, pulse (!) 59, height 5' 6.5" (1.689 m), weight 176 lb (79.8 kg), last menstrual period 10/03/2005, SpO2 98 %.  HYPERTENSION CONTROL Vitals:   10/27/22 1105  BP: (!) 140/88    The patient's blood pressure is elevated above target today.  In order to address the patient's elevated BP: A new medication was prescribed today.       GEN:  Well nourished, well developed in no acute distress HEENT: Normal NECK: No JVD; No carotid bruits LYMPHATICS: No lymphadenopathy CARDIAC: RR,   soft systolic murmur at lower L sternal border.  No radiation to the left axillary region .Marland Kitchen   RESPIRATORY:  Clear to auscultation without rales, wheezing or rhonchi  ABDOMEN: Soft, non-tender, non-distended MUSCULOSKELETAL:  No edema; No deformity  SKIN: Warm and dry NEUROLOGIC:  Alert and oriented x 3   ASSESSMENT:    No diagnosis found.  PLAN:      LBBB:   stable  LV function is normal .   Will cont to follow   2.  Hypertension: Blood pressure has been mildly elevated.  This is likely because she has not been able to exercise much.  She is also regained some of her weight.  Will add valsartan 80 mg a day.  She will get a BMP  checked at the [facility] and will let me know when that blood work is in 1 month  3.   Hyperlipidemia :  labs are stable               Medication Adjustments/Labs and Tests Ordered: Current medicines are reviewed at length with the patient today.  Concerns regarding medicines are outlined above.  No orders of the defined types were placed in this encounter.  Meds ordered this encounter  Medications   valsartan (DIOVAN) 80 MG tablet    Sig: Take 1 tablet (80 mg total) by mouth daily.    Dispense:  90 tablet    Refill:  3   hydrochlorothiazide (MICROZIDE) 12.5 MG capsule    Sig: Take 1 capsule (12.5 mg total) by mouth daily.    Dispense:  90 capsule    Refill:  3    Dose change (decreased)     Patient Instructions  Medication Instructions:  Your physician has recommended you make the following change in your medication:   1) START valsartan '80mg'$  daily 2) CHANGE hydrochlorothiazide to 12.'5mg'$  daily  *If you need a refill on your cardiac medications before your next appointment, please call your pharmacy*  Lab Work: None  Testing/Procedures: None  Follow-Up: At [facility], you and your health needs are our priority.  As part of our continuing mission to provide you with exceptional heart care, we have created designated Provider Care Teams.  These Care Teams include your primary Cardiologist (physician) and Advanced Practice Providers (APPs -  Physician Assistants and Nurse Practitioners) who all work  together to provide you with the care you need, when you need it.  Your next appointment:   3 month(s)  Provider:   Mertie Moores, MD   Signed, Mertie Moores, MD  10/27/2022 2:16 PM    [place]

## 2022-10-27 ENCOUNTER — Other Ambulatory Visit (HOSPITAL_COMMUNITY): Payer: Self-pay

## 2022-10-27 ENCOUNTER — Ambulatory Visit: Payer: Commercial Managed Care - PPO | Attending: Cardiovascular Disease | Admitting: Cardiovascular Disease

## 2022-10-27 ENCOUNTER — Encounter: Payer: Self-pay | Admitting: Cardiovascular Disease

## 2022-10-27 VITALS — BP 140/88 | HR 59 | Ht 66.5 in | Wt 176.0 lb

## 2022-10-27 DIAGNOSIS — I447 Left bundle-branch block, unspecified: Secondary | ICD-10-CM

## 2022-10-27 DIAGNOSIS — I10 Essential (primary) hypertension: Secondary | ICD-10-CM

## 2022-10-27 MED ORDER — VALSARTAN 80 MG PO TABS
80.0000 mg | ORAL_TABLET | Freq: Every day | ORAL | 3 refills | Status: DC
  Filled 2022-10-27: qty 90, 90d supply, fill #0
  Filled 2022-11-29: qty 90, 90d supply, fill #1
  Filled 2022-11-30 – 2023-01-16 (×2): qty 90, 90d supply, fill #0
  Filled 2023-04-20: qty 90, 90d supply, fill #1
  Filled 2023-07-19: qty 90, 90d supply, fill #2

## 2022-10-27 MED ORDER — HYDROCHLOROTHIAZIDE 12.5 MG PO CAPS
12.5000 mg | ORAL_CAPSULE | Freq: Every day | ORAL | 3 refills | Status: DC
  Filled 2022-10-27 (×2): qty 90, 90d supply, fill #0

## 2022-10-27 NOTE — Patient Instructions (Signed)
Medication Instructions:  Your physician has recommended you make the following change in your medication:   1) START valsartan '80mg'$  daily 2) CHANGE hydrochlorothiazide to 12.'5mg'$  daily  *If you need a refill on your cardiac medications before your next appointment, please call your pharmacy*  Lab Work: None  Testing/Procedures: None  Follow-Up: At [facility], you and your health needs are our priority.  As part of our continuing mission to provide you with exceptional heart care, we have created designated Provider Care Teams.  These Care Teams include your primary Cardiologist (physician) and Advanced Practice Providers (APPs -  Physician Assistants and Nurse Practitioners) who all work together to provide you with the care you need, when you need it.  Your next appointment:   3 month(s)  Provider:   Mertie Moores, MD

## 2022-11-02 NOTE — Progress Notes (Signed)
Chief Complaint:   OBESITY Sharon Hunter is here to discuss her progress with her obesity treatment plan along with follow-up of her obesity related diagnoses. [name] is on keeping a food journal and adhering to recommended goals of 1000 calories and 80+ grams of protein and states she is following her eating plan approximately 75% of the time. [name] states she is not currently exercising.  Today's visit was #: 10 Starting weight: 191 lbs Starting date: 02/22/2022 Today's weight: 176 lbs Today's date: 10/13/2022 Total lbs lost to date: 15 Total lbs lost since last in-office visit: 4  Interim History: [name] gave up wine for 2 weeks. She is focused on increasing protein. Starting Hello Fresh home meals. She is just coming off long taper steroids for RA flair.   Subjective:   1. Prediabetes Last A1c was 5.8 four months ago (up from prior at 5.7). Not on medications, with good control of hunger and cravings.   2. Essential hypertension Patient notes mild headache, just started recently today. Otherwise, has no concerns or new symptoms.   3. Vitamin D deficiency Patient is taking 5,000 IU OTC Vitamin D. Last level was 36.0 four months ago. Patient has been much more consistent with supplementation.   Assessment/Plan:  No orders of the defined types were placed in this encounter.   There are no discontinued medications.   No orders of the defined types were placed in this encounter.    1. Prediabetes We will recheck fasting insulin and A1c. She will continue prudent nutritional plan and weight loss.   2. Essential hypertension Blood pressure is unusually high today. She states has been well controlled at home and at other doctor's offices. Continue HCTZ and Norvasc. Decrease salt intake and increase water. Home blood pressure monitor recommended 2-3 days per week.   3. Vitamin D deficiency Continue OTC supplementation. Recheck labs at next office visit.   4. Obesity, Current BMI  28.4 [place] is currently in the action stage of change. As such, her goal is to continue with weight loss efforts. She has agreed to keeping a food journal and adhering to recommended goals of 1000 calories and 80+ grams of protein.   Try to increase activity in the [place] Year as tolerated. We will recheck Vit D, FLP, A1c, etc.   Exercise goals: All adults should avoid inactivity. Some physical activity is better than none, and adults who participate in any amount of physical activity gain some health benefits.  Behavioral modification strategies: increasing lean protein intake and avoiding temptations.  [name] has agreed to follow-up with our clinic in 4 to 6 weeks. She was informed of the importance of frequent follow-up visits to maximize her success with intensive lifestyle modifications for her multiple health conditions.   Objective:   Blood pressure (!) 144/78, pulse 61, temperature 97.9 F (36.6 C), height '5\' 6"'$  (1.676 m), weight 176 lb (79.8 kg), last menstrual period 10/03/2005, SpO2 98 %. Body mass index is 28.41 kg/m.  General: Cooperative, alert, well developed, in no acute distress. HEENT: Conjunctivae and lids unremarkable. Cardiovascular: Regular rhythm.  Lungs: Normal work of breathing. Neurologic: No focal deficits.   Lab Results  Component Value Date   CREATININE 0.64 07/12/2022   BUN 21 07/12/2022   NA 139 07/12/2022   K 3.9 07/12/2022   CL 103 07/12/2022   CO2 28 07/12/2022   Lab Results  Component Value Date   ALT 22 07/12/2022   AST 14 07/12/2022   ALKPHOS 81  02/22/2022   BILITOT 0.4 07/12/2022   Lab Results  Component Value Date   HGBA1C 5.8 (H) 05/30/2022   HGBA1C 5.7 (H) 02/22/2022   Lab Results  Component Value Date   INSULIN 3.0 05/30/2022   INSULIN 11.3 02/22/2022   Lab Results  Component Value Date   TSH 0.753 02/22/2022   Lab Results  Component Value Date   CHOL 232 (H) 05/30/2022   HDL 97 05/30/2022   LDLCALC 118 (H) 05/30/2022    LDLDIRECT 132.6 05/23/2013   TRIG 102 05/30/2022   CHOLHDL 2.4 05/30/2022   Lab Results  Component Value Date   VD25OH 36.0 05/30/2022   VD25OH 31.0 02/22/2022   VD25OH 32 01/30/2018   Lab Results  Component Value Date   WBC 7.5 07/12/2022   HGB 13.3 07/12/2022   HCT 38.9 07/12/2022   MCV 92.8 07/12/2022   PLT 283 07/12/2022   No results found for: "IRON", "TIBC", "FERRITIN"  Attestation Statements:   Reviewed by clinician on day of visit: allergies, medications, problem list, medical history, surgical history, family history, social history, and previous encounter notes.   Wilhemena Durie, am acting as transcriptionist for [employer], DO.  I have reviewed the above documentation for accuracy and completeness, and I agree with the above. Marjory Sneddon, D.O.  The [place] was signed into law in 2016 which includes the topic of electronic health records.  This provides immediate access to information in MyChart.  This includes consultation notes, operative notes, office notes, lab results and pathology reports.  If you have any questions about what you read please let us know at your next visit so we can discuss your concerns and take corrective action if need be.  We are right here with you.

## 2022-11-17 ENCOUNTER — Other Ambulatory Visit (HOSPITAL_COMMUNITY): Payer: Self-pay

## 2022-11-21 ENCOUNTER — Other Ambulatory Visit: Payer: Self-pay

## 2022-11-22 ENCOUNTER — Other Ambulatory Visit (HOSPITAL_COMMUNITY): Payer: Self-pay

## 2022-11-28 ENCOUNTER — Ambulatory Visit (INDEPENDENT_AMBULATORY_CARE_PROVIDER_SITE_OTHER): Payer: Commercial Managed Care - PPO | Admitting: Family Medicine

## 2022-11-28 ENCOUNTER — Encounter (INDEPENDENT_AMBULATORY_CARE_PROVIDER_SITE_OTHER): Payer: Self-pay | Admitting: Physician Assistant

## 2022-11-28 ENCOUNTER — Other Ambulatory Visit: Payer: Self-pay | Admitting: *Deleted

## 2022-11-28 ENCOUNTER — Ambulatory Visit (INDEPENDENT_AMBULATORY_CARE_PROVIDER_SITE_OTHER): Payer: Commercial Managed Care - PPO | Admitting: Physician Assistant

## 2022-11-28 VITALS — BP 117/73 | HR 60 | Temp 97.9°F | Ht 66.0 in | Wt 182.4 lb

## 2022-11-28 DIAGNOSIS — E7849 Other hyperlipidemia: Secondary | ICD-10-CM | POA: Diagnosis not present

## 2022-11-28 DIAGNOSIS — Z79899 Other long term (current) drug therapy: Secondary | ICD-10-CM

## 2022-11-28 DIAGNOSIS — E559 Vitamin D deficiency, unspecified: Secondary | ICD-10-CM

## 2022-11-28 DIAGNOSIS — Z6829 Body mass index (BMI) 29.0-29.9, adult: Secondary | ICD-10-CM | POA: Diagnosis not present

## 2022-11-28 DIAGNOSIS — M0579 Rheumatoid arthritis with rheumatoid factor of multiple sites without organ or systems involvement: Secondary | ICD-10-CM

## 2022-11-28 DIAGNOSIS — Z5181 Encounter for therapeutic drug level monitoring: Secondary | ICD-10-CM

## 2022-11-28 DIAGNOSIS — R7303 Prediabetes: Secondary | ICD-10-CM | POA: Diagnosis not present

## 2022-11-28 DIAGNOSIS — E669 Obesity, unspecified: Secondary | ICD-10-CM

## 2022-11-28 NOTE — Progress Notes (Signed)
Chief Complaint:   OBESITY Sharon Hunter is here to discuss her progress with her obesity treatment plan along with follow-up of her obesity related diagnoses. Sharon Hunter is on keeping a food journal and adhering to recommended goals of 1000 calories and 80+ grams of protein and states she is following her eating plan approximately 50% of the time. Sharon Hunter states she is exercising very little due to RA flare.  0 minutes 0 times per week.  Today's visit was #: 11 Starting weight: 191 lbs Starting date: 02/22/2022 Today's weight: 182 lbs Today's date: 11/28/2022 Total lbs lost to date: 9 lbs Total lbs lost since last in-office visit: + 6 lbs  Interim History: Sharon Hunter has been struggling on RA flares every few days. She is on a new medication after long steroid taper of steroids. She is not having much improvement of pain or mobility on the new medication, Actemra thus far. This is limiting her mobility.  She reports has had company visiting in home as well and this added to stressors.  She has been very fatigued as well.  Unable to tolerate much besides brief walks with her dog.  Breakfast- cottage cheese and fruit Lunch- salad with meat Dinner Hello fresh meals  Bio impedence scale shows: Muscle mass increased by 4.6 lbs     Adipose mass increased by 1.6 lbs     Water increased by 6.8 lbs.   Subjective:   1. Other hyperlipidemia Checking fasting lab today. No statin. 05/30/22- HDL 97- at goal /LDL 118 not at goal/ Triglycerides 102 - at goal.   2. Prediabetes A1c 5.8 increased from previous. She has been working on decreasing simple carbohydrates, increasing lean protein and exercise to promote weight loss and improve glycemic control and prevent progression to Type 2 diabetes. Recheck lab today  3. Vitamin D deficiency Last vit D level 36. 05/25/22. Not at goal. On OTC vitamin D 5000 UT daily. No side effects. Recheck level today.  4. Obesity- Start BMI 30.83 5. BMI 29.0-29.9,adult, Current  BMI 29.4  Assessment/Plan:   1. Other hyperlipidemia Recheck fasting labs today.  Hyperlipidemia LDL is not at goal. Medication(s): None Cardiovascular risk factors: hypertension  Lab Results  Component Value Date   CHOL 232 (H) 05/30/2022   HDL 97 05/30/2022   LDLCALC 118 (H) 05/30/2022   LDLDIRECT 132.6 05/23/2013   TRIG 102 05/30/2022   CHOLHDL 2.4 05/30/2022   CHOLHDL 2.6 02/22/2022   CHOLHDL 3 10/29/2021   Lab Results  Component Value Date   ALT 22 07/12/2022   AST 14 07/12/2022   ALKPHOS 81 02/22/2022   BILITOT 0.4 07/12/2022   The 10-year ASCVD risk score (Arnett DK, et al., 2019) is: 3.2%   Values used to calculate the score:     Age: 62 years     Sex: Female     Is Non-Hispanic African American: No     Diabetic: No     Tobacco smoker: No     Systolic Blood Pressure: 123XX123 mmHg     Is BP treated: Yes     HDL Cholesterol: 97 mg/dL     Total Cholesterol: 232 mg/dL  Plan:  Continue to follow nutrition plan to decrease saturated fat and cholesterol in diet and exercise as able to promote weight loss and improve lipid profile/Decrease risk of CV disease.   - Lipid Panel With LDL/HDL Ratio  2. Prediabetes Continue to decrease simple carbohydrates, increase lean protein and exercise to promote weight loss and  prevent progression to Type 2 diabetes.   3. Vitamin D deficiency Continue OTC Vit D 5000 UT daily. Follow up level today.   4. Obesity- Start BMI 30.83  5. BMI 29.0-29.9,adult, Current BMI 29.4  Sharon Hunter is currently in the action stage of change. As such, her goal is to continue with weight loss efforts. She has agreed to following a lower carbohydrate, vegetable and lean protein rich diet plan.   Exercise goals: All adults should avoid inactivity. Some physical activity is better than none, and adults who participate in any amount of physical activity gain some health benefits.  Behavioral modification strategies: increasing lean protein intake,  decreasing simple carbohydrates, increasing vegetables, and planning for success.  Sharon Hunter has agreed to follow-up with our clinic in 4 weeks. She was informed of the importance of frequent follow-up visits to maximize her success with intensive lifestyle modifications for her multiple health conditions.   Sharon Hunter was informed we would discuss her lab results at her next visit unless there is a critical issue that needs to be addressed sooner. Sharon Hunter agreed to keep her next visit at the agreed upon time to discuss these results.  Objective:   Blood pressure 117/73, pulse 60, temperature 97.9 F (36.6 C), height '5\' 6"'$  (1.676 m), weight 182 lb 6.4 oz (82.7 kg), last menstrual period 10/03/2005, SpO2 98 %. Body mass index is 29.44 kg/m.  General: Cooperative, alert, well developed, in no acute distress. HEENT: Conjunctivae and lids unremarkable. Cardiovascular: Regular rhythm.  Lungs: Normal work of breathing. Neurologic: No focal deficits.   Lab Results  Component Value Date   CREATININE 0.64 07/12/2022   BUN 21 07/12/2022   NA 139 07/12/2022   K 3.9 07/12/2022   CL 103 07/12/2022   CO2 28 07/12/2022   Lab Results  Component Value Date   ALT 22 07/12/2022   AST 14 07/12/2022   ALKPHOS 81 02/22/2022   BILITOT 0.4 07/12/2022   Lab Results  Component Value Date   HGBA1C 5.8 (H) 05/30/2022   HGBA1C 5.7 (H) 02/22/2022   Lab Results  Component Value Date   INSULIN 3.0 05/30/2022   INSULIN 11.3 02/22/2022   Lab Results  Component Value Date   TSH 0.753 02/22/2022   Lab Results  Component Value Date   CHOL 232 (H) 05/30/2022   HDL 97 05/30/2022   LDLCALC 118 (H) 05/30/2022   LDLDIRECT 132.6 05/23/2013   TRIG 102 05/30/2022   CHOLHDL 2.4 05/30/2022   Lab Results  Component Value Date   VD25OH 36.0 05/30/2022   VD25OH 31.0 02/22/2022   VD25OH 32 01/30/2018   Lab Results  Component Value Date   WBC 7.5 07/12/2022   HGB 13.3 07/12/2022   HCT 38.9 07/12/2022   MCV 92.8  07/12/2022   PLT 283 07/12/2022   No results found for: "IRON", "TIBC", "FERRITIN"  Obesity Behavioral Intervention:   Approximately 15 minutes were spent on the discussion below.  ASK: We discussed the diagnosis of obesity with Sharon Hunter today and Sharon Hunter agreed to give [place] permission to discuss obesity behavioral modification therapy today.  ASSESS: Sharon Hunter has the diagnosis of obesity and her BMI today is 29.4. Sharon Hunter is in the action stage of change.   ADVISE: Sharon Hunter was educated on the multiple health risks of obesity as well as the benefit of weight loss to improve her health. She was advised of the need for long term treatment and the importance of lifestyle modifications to improve her current health and to decrease  her risk of future health problems.  AGREE: Multiple dietary modification options and treatment options were discussed and Sharon Hunter agreed to follow the recommendations documented in the above note.  ARRANGE: Sharon Hunter was educated on the importance of frequent visits to treat obesity as outlined per CMS and USPSTF guidelines and agreed to schedule her next follow up appointment today.  Attestation Statements:   Reviewed by clinician on day of visit: allergies, medications, problem list, medical history, surgical history, family history, social history, and previous encounter notes.  Time spent on visit including pre-visit chart review and post-visit care and charting was 45 minutes.  Cinzia Devos,PA-C

## 2022-11-29 ENCOUNTER — Encounter: Payer: Self-pay | Admitting: Cardiovascular Disease

## 2022-11-29 LAB — COMPLETE METABOLIC PANEL WITH GFR
AG Ratio: 1.8 (calc) (ref 1.0–2.5)
ALT: 26 U/L (ref 6–29)
AST: 13 U/L (ref 10–35)
Albumin: 4.3 g/dL (ref 3.6–5.1)
Alkaline phosphatase (APISO): 46 U/L (ref 37–153)
BUN: 19 mg/dL (ref 7–25)
CO2: 29 mmol/L (ref 20–32)
Calcium: 9.4 mg/dL (ref 8.6–10.4)
Chloride: 103 mmol/L (ref 98–110)
Creat: 0.65 mg/dL (ref 0.50–1.05)
Globulin: 2.4 g/dL (calc) (ref 1.9–3.7)
Glucose, Bld: 111 mg/dL — ABNORMAL HIGH (ref 65–99)
Potassium: 4.1 mmol/L (ref 3.5–5.3)
Sodium: 138 mmol/L (ref 135–146)
Total Bilirubin: 0.5 mg/dL (ref 0.2–1.2)
Total Protein: 6.7 g/dL (ref 6.1–8.1)
eGFR: 100 mL/min/{1.73_m2} (ref >=60)

## 2022-11-29 LAB — CBC WITH DIFFERENTIAL/PLATELET
Absolute Monocytes: 400 cells/uL (ref 200–950)
Basophils Absolute: 30 cells/uL (ref 0–200)
Basophils Relative: 0.6 %
Eosinophils Absolute: 240 cells/uL (ref 15–500)
Eosinophils Relative: 4.8 %
HCT: 39 % (ref 35.0–45.0)
Hemoglobin: 12.9 g/dL (ref 11.7–15.5)
Lymphs Abs: 2030 cells/uL (ref 850–3900)
MCH: 31 pg (ref 27.0–33.0)
MCHC: 33.1 g/dL (ref 32.0–36.0)
MCV: 93.8 fL (ref 80.0–100.0)
MPV: 10.6 fL (ref 7.5–12.5)
Monocytes Relative: 8 %
Neutro Abs: 2300 cells/uL (ref 1500–7800)
Neutrophils Relative %: 46 %
Platelets: 264 10*3/uL (ref 140–400)
RBC: 4.16 10*6/uL (ref 3.80–5.10)
RDW: 12.8 % (ref 11.0–15.0)
Total Lymphocyte: 40.6 %
WBC: 5 10*3/uL (ref 3.8–10.8)

## 2022-11-29 LAB — INSULIN, RANDOM: INSULIN: 9.2 u[IU]/mL (ref 2.6–24.9)

## 2022-11-29 LAB — LIPID PANEL
Cholesterol: 268 mg/dL — ABNORMAL HIGH (ref <200)
HDL: 105 mg/dL (ref >=50)
LDL Cholesterol (Calc): 138 mg/dL (calc) — ABNORMAL HIGH
Non-HDL Cholesterol (Calc): 163 mg/dL (calc) — ABNORMAL HIGH (ref <130)
Total CHOL/HDL Ratio: 2.6 (calc) (ref <5.0)
Triglycerides: 125 mg/dL (ref <150)

## 2022-11-29 LAB — LIPID PANEL WITH LDL/HDL RATIO
Cholesterol, Total: 273 mg/dL — ABNORMAL HIGH (ref 100–199)
HDL: 108 mg/dL (ref >39)
LDL Chol Calc (NIH): 145 mg/dL — ABNORMAL HIGH (ref 0–99)
LDL/HDL Ratio: 1.3 ratio (ref 0.0–3.2)
Triglycerides: 117 mg/dL (ref 0–149)
VLDL Cholesterol Cal: 20 mg/dL (ref 5–40)

## 2022-11-29 LAB — VITAMIN D 25 HYDROXY (VIT D DEFICIENCY, FRACTURES): Vit D, 25-Hydroxy: 30.1 ng/mL (ref 30.0–100.0)

## 2022-11-29 LAB — HEMOGLOBIN A1C
Est. average glucose Bld gHb Est-mCnc: 123 mg/dL
Hgb A1c MFr Bld: 5.9 % — ABNORMAL HIGH (ref 4.8–5.6)

## 2022-11-29 NOTE — Progress Notes (Signed)
LDL is elevated and cholesterol are elevated.  CBC and CMP are normal.  Please forward results to her PCP.

## 2022-11-30 ENCOUNTER — Telehealth: Payer: Self-pay | Admitting: Cardiovascular Disease

## 2022-11-30 ENCOUNTER — Other Ambulatory Visit (HOSPITAL_COMMUNITY): Payer: Self-pay

## 2022-11-30 DIAGNOSIS — I10 Essential (primary) hypertension: Secondary | ICD-10-CM

## 2022-11-30 DIAGNOSIS — E782 Mixed hyperlipidemia: Secondary | ICD-10-CM

## 2022-11-30 MED ORDER — HYDROCHLOROTHIAZIDE 25 MG PO TABS
12.5000 mg | ORAL_TABLET | Freq: Every day | ORAL | 3 refills | Status: DC
  Filled 2022-11-30: qty 45, 90d supply, fill #0
  Filled 2023-03-08: qty 45, 90d supply, fill #1
  Filled 2023-06-25: qty 45, 90d supply, fill #2
  Filled 2023-08-31 – 2023-09-18 (×3): qty 45, 90d supply, fill #3

## 2022-11-30 NOTE — Telephone Encounter (Signed)
-----   Message from Thayer Headings, MD sent at 11/29/2022  5:54 PM EST ----- Good morning  Will you order a coronary calcium score for Sharon Hunter at [place].  Thanks  P

## 2022-11-30 NOTE — Telephone Encounter (Signed)
Order placed at this time. [facility] will call to schedule.

## 2022-12-01 ENCOUNTER — Other Ambulatory Visit (HOSPITAL_COMMUNITY): Payer: Self-pay

## 2022-12-07 NOTE — Progress Notes (Signed)
Not seen

## 2022-12-09 ENCOUNTER — Other Ambulatory Visit (HOSPITAL_COMMUNITY): Payer: Self-pay

## 2022-12-12 ENCOUNTER — Other Ambulatory Visit: Payer: Self-pay | Admitting: *Deleted

## 2022-12-12 ENCOUNTER — Encounter: Payer: Self-pay | Admitting: Family Medicine

## 2022-12-12 MED ORDER — PREDNISONE 5 MG PO TABS
ORAL_TABLET | ORAL | 0 refills | Status: AC
  Filled 2022-12-12 – 2022-12-13 (×2): qty 70, 28d supply, fill #0

## 2022-12-12 NOTE — Telephone Encounter (Signed)
I called the patient to discuss the symptoms she is currently experiencing. Discussed that she is on the proper dose of Actemra given her weight.  Discussed that if she was over 100 kg the dose could be increased to once weekly.  Discussed that if she continues to have recurrent flares we may need to discuss combination therapy at her upcoming follow-up visit. A Prednisone taper starting at 20 mg tapering by 5 mg every week will be sent to the pharmacy today.  Please pend this prescription for prednisone.  Discussed that she should take prednisone in the morning with food and avoid the use of NSAIDs.

## 2022-12-13 ENCOUNTER — Other Ambulatory Visit: Payer: Self-pay

## 2022-12-13 ENCOUNTER — Telehealth: Payer: Self-pay | Admitting: *Deleted

## 2022-12-13 ENCOUNTER — Other Ambulatory Visit (HOSPITAL_COMMUNITY): Payer: Self-pay

## 2022-12-13 NOTE — Telephone Encounter (Signed)
Dr. Etter Sjogren would like to start [facility]

## 2022-12-13 NOTE — Telephone Encounter (Signed)
Message sent to authorization team.

## 2022-12-14 NOTE — Telephone Encounter (Signed)
Submitted verification of benefits for Evenity via [email or web address] portal.

## 2022-12-15 ENCOUNTER — Other Ambulatory Visit (HOSPITAL_COMMUNITY): Payer: Self-pay

## 2022-12-16 ENCOUNTER — Other Ambulatory Visit: Payer: Self-pay

## 2022-12-19 ENCOUNTER — Other Ambulatory Visit: Payer: Self-pay

## 2022-12-19 NOTE — Progress Notes (Signed)
Office Visit Note  Patient: Sharon Hunter             Date of Birth: 1961-03-12           MRN: [id number]             PCP: [name], DO Referring: [name], * Visit Date: 01/02/2023 Occupation: @GUAROCC @  Subjective:  Medication monitoring   History of Present Illness: Sharon Hunter is a 62 y.o. female with history of seropositive rheumatoid arthritis.  Patient is currently taking currently on Actemra 162 mg sq every 14 days. She was started on Actemra on 10/17/22.  She is tolerating Actemra without any side effects or injection site reactions.  She has been taking the prednisone taper as prescribed on 12/12/2022.  She is down to taking prednisone 5 mg daily.  She states that she has noticed a substantial improvement in her symptoms since initiating prednisone but she has also noticed weight gain while taking the taper.  She is due for her next Actemra dose on Sunday.      Activities of Daily Living:  Patient reports morning stiffness for 0 minute.   Patient Denies nocturnal pain.  Difficulty dressing/grooming: Denies Difficulty climbing stairs: Denies Difficulty getting out of chair: Denies Difficulty using hands for taps, buttons, cutlery, and/or writing: Denies  Review of Systems  Constitutional:  Positive for fatigue.  HENT:  Positive for mouth dryness. Negative for mouth sores.   Eyes:  Positive for dryness.  Respiratory:  Negative for shortness of breath.   Cardiovascular:  Negative for chest pain and palpitations.  Gastrointestinal:  Negative for blood in stool, constipation and diarrhea.  Endocrine: Negative for increased urination.  Genitourinary:  Negative for involuntary urination.  Musculoskeletal:  Positive for joint pain, joint pain, joint swelling, myalgias and myalgias. Negative for gait problem, muscle weakness, morning stiffness and muscle tenderness.  Skin:  Negative for color change, rash, hair loss and sensitivity to sunlight.   Allergic/Immunologic: Negative for susceptible to infections.  Neurological:  Negative for dizziness and headaches.  Hematological:  Negative for swollen glands.  Psychiatric/Behavioral:  Positive for sleep disturbance. Negative for depressed mood. The patient is not nervous/anxious.     PMFS History:  Patient Active Problem List   Diagnosis Date Noted   Mood disorder ([place])- Emotional Eating 08/02/2022   Class 1 obesity with serious comorbidity and body mass index (BMI) of 30.0 to 30.9 in adult 08/02/2022   At risk for impaired metabolic function [id number]   Perioral dermatitis 05/26/2022   Preventative health care 05/26/2022   Essential hypertension 05/26/2022   Abnormal EKG 05/26/2022   Prediabetes 03/09/2022   Vitamin B12 deficiency 03/09/2022   Primary hypertension 02/22/2022   Hyperlipidemia 02/22/2022   GAD (generalized anxiety disorder) 02/22/2022   Vitamin D deficiency 02/22/2022   Ear pain, left 08/13/2021   History of anxiety 03/27/2017   History of hypertension 03/27/2017   History of bundle branch block 03/27/2017   Primary osteoarthritis of both hands 12/22/2016   History of shingles 08/02/2016   High risk medication use 08/02/2016   Hypertension 08/01/2016   IBS (irritable bowel syndrome) 08/01/2016   Anxiety 08/01/2016   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   LBBB (left bundle branch block) 01/30/2015   Palpitations 01/30/2015   Pulmonary nodule 07/04/2011   Murmur 02/03/2011   Rheumatoid arthritis 02/03/2011   Hair loss 02/03/2011   Fatigue 02/03/2011    Past Medical History:  Diagnosis Date  Anxiety    Breast lump    stable-- gets annual mammogram   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   Hypertension    IBS (irritable bowel syndrome) 08/01/2016   Joint pain    Lactose intolerance    Migraine    Murmur 02/03/2011   Obese    Ovarian failure    Palpitation    Pulmonary nodule, left 2009   last CT Nov 2011-- due 08/2011    Rheumatoid arthritis(714.0)    in remission , off meds as of Summer 2012   Vitamin D deficiency     Family History  Problem Relation Age of Onset   Arthritis Mother    Uterine cancer Mother    Hypertension Mother    Diabetes Mother    Alcohol abuse Father    Hyperlipidemia Father    Heart disease Father    High blood pressure Father    Sleep apnea Father    Heart disease Sister        cardiac stent   Diabetes Sister    Hyperlipidemia Sister    Hypertension Sister    Alcohol abuse Brother    Diabetes Brother    Hypertension Brother    Cancer Brother        lung   Cancer Maternal Aunt        lung----non smoker   Past Surgical History:  Procedure Laterality Date   CHOLECYSTECTOMY  1999   COLONOSCOPY  10/31/11   EXPLORATORY LAPAROTOMY     TONSILLECTOMY  1969   WISDOM TOOTH EXTRACTION     Social History   Social History Narrative   Exercise-- jogs   Immunization History  Administered Date(s) Administered   Influenza Split 07/03/2013   Influenza,inj,Quad PF,6-35 Mos 07/17/2020   Influenza-Unspecified 07/03/2014, 07/04/2015   PFIZER(Purple Top)SARS-COV-2 Vaccination 01/13/2020, 05/15/2020   Tdap 12/01/2009     Objective: Vital Signs: BP 127/79 (BP Location: Left Arm, Patient Position: Sitting, Cuff Size: Normal)   Pulse (!) 55   Resp 12   Ht 5\' 6"  (1.676 m)   Wt 192 lb (87.1 kg)   LMP 10/03/2005   BMI 30.99 kg/m    Physical Exam Vitals and nursing note reviewed.  Constitutional:      Appearance: She is well-developed.  HENT:     Head: Normocephalic and atraumatic.  Eyes:     Conjunctiva/sclera: Conjunctivae normal.  Cardiovascular:     Rate and Rhythm: Normal rate and regular rhythm.     Heart sounds: Normal heart sounds.  Pulmonary:     Effort: Pulmonary effort is normal.     Breath sounds: Normal breath sounds.  Abdominal:     General: Bowel sounds are normal.     Palpations: Abdomen is soft.  Musculoskeletal:     Cervical back: Normal range of  motion.  Lymphadenopathy:     Cervical: No cervical adenopathy.  Skin:    General: Skin is warm and dry.     Capillary Refill: Capillary refill takes less than 2 seconds.  Neurological:     Mental Status: She is alert and oriented to person, place, and time.  Psychiatric:        Behavior: Behavior normal.      Musculoskeletal Exam: C-spine, thoracic spine, lumbar spine have good range of motion.  Shoulder joints, elbow joints, wrist joints, MCPs, PIPs, DIPs have good range of motion with no synovitis.  Some tenderness over the right first MCP joint.  Synovial thickening of bilateral second and third  MCP joints.  Hip joints have good range of motion with no groin pain.  Knee joints have good range of motion with no warmth or effusion.  Ankle joints have good range of motion with no tenderness or synovitis.  Bunions over the bilateral first MCP joint and tailor bunions noted.  No tenderness or synovitis over MTP joints today.  CDAI Exam: CDAI Score: 1.2  Patient Global: 0 mm; Provider Global: 2 mm Swollen: 0 ; Tender: 1  Joint Exam 01/02/2023      Right  Left  MCP 1   Tender        Investigation: No additional findings.  Imaging: No results found.  Recent Labs: Lab Results  Component Value Date   WBC 5.0 11/28/2022   HGB 12.9 11/28/2022   PLT 264 11/28/2022   NA 138 11/28/2022   K 4.1 11/28/2022   CL 103 11/28/2022   CO2 29 11/28/2022   GLUCOSE 111 (H) 11/28/2022   BUN 19 11/28/2022   CREATININE 0.65 11/28/2022   BILITOT 0.5 11/28/2022   ALKPHOS 81 02/22/2022   AST 13 11/28/2022   ALT 26 11/28/2022   PROT 6.7 11/28/2022   ALBUMIN 4.7 02/22/2022   CALCIUM 9.4 11/28/2022   GFRAA 86 01/07/2021   QFTBGOLDPLUS NEGATIVE 09/12/2018    Speciality Comments: TB Gold: 07/25/2022 Neg  Prior therapy: methotrexate, remicade, orencia, and enbrel (inadequate response), Simponi stopped Dec 2023, Actemra started 10/17/22  Procedures:  No procedures performed Allergies: Other  and Contrast media [iodinated contrast media]   Assessment / Plan:     Visit Diagnoses: Rheumatoid arthritis involving multiple sites with positive rheumatoid factor: She has no synovitis on examination today.  A prednisone taper starting at 20 mg tapering by 5 mg every 7 days was sent to the pharmacy on 12/12/2022.  She is down to prednisone 5 mg daily.  She has noticed a significant improvement while taking prednisone.  Prior to initiating prednisone she had a severe flare to the point that she was considering filing for disability.  She was having pain and inflammation involving multiple joints he was having difficulty performing ADLs as well as working due to the severity of her symptoms.  She is currently on Actemra 162 mg subcutaneous injections every 14 days.  She is due for her 6 dose of Actemra on Sunday.  Discussed adding on Plaquenil or arava as combination therapy, but she declined at this time.  She would like to remain on Actemra as monotherapy with close monitoring.  She plans on completing the prednisone taper as prescribed. She was advised to notify us if she starts to have recurrent flares.  She will follow-up in the office in 2 to 3 months or sooner if needed.  High risk medication use - Currently on Actemra 162 mg sq every 14 days.   Actemra was initiated on 10/17/2022. CBC and CMP updated on 11/28/22.  Her next lab work will be due in May and every 3 months to monitor for drug toxicity. TB gold negative on 07/27/22 and will continue to be monitored yearly. Discussed the importance of holding Actemra if she develops signs or symptoms of an infection and to resume once the infection has completely cleared.  Previous therapy includes methotrexate, Remicade, Orencia, Enbrel, and Simponi.  Chronic pain of both shoulders: She continues to have some discomfort and stiffness with range of motion of both shoulders especially with internal rotation.  Primary osteoarthritis of both hands -  Rheumatoid arthritis and osteoarthritis overlap  with synovitis: She has thickening and tenderness over the right first MCP joint.  Pain in both feet: Bunion formation noted over bilateral first and fifth MTP joints.  She has difficulty wearing certain shoes due to the bunion formation.  Offered a referral to podiatry.  Discussed the importance of wearing proper fitting shoes.  She had no synovitis on examination today.  Right ankle swelling: No tenderness or synovitis noted today.  She has noticed improvement while taking the prednisone taper prescribed on 12/12/2022.  Other fatigue: Chronic, stable.  Sicca syndrome: Chronic, unchanged.    History of vitamin D deficiency: Vitamin D was 30.1 on 11/28/2022.  Other medical conditions are listed as follows:  History of anxiety  History of hypertension: Blood pressure was 127/79 today in the office.  Pulmonary nodule  History of shingles  History of bundle branch block  Orders: No orders of the defined types were placed in this encounter.  No orders of the defined types were placed in this encounter.    Follow-Up Instructions: Return in about 3 months (around 04/03/2023) for Rheumatoid arthritis.   Ofilia Neas, PA-C  Note - This record has been created using Dragon software.  Chart creation errors have been sought, but may not always  have been located. Such creation errors do not reflect on  the standard of medical care.

## 2022-12-20 ENCOUNTER — Other Ambulatory Visit (HOSPITAL_COMMUNITY): Payer: Self-pay

## 2022-12-21 ENCOUNTER — Other Ambulatory Visit (HOSPITAL_COMMUNITY): Payer: Self-pay

## 2022-12-21 ENCOUNTER — Telehealth: Payer: Self-pay

## 2022-12-21 NOTE — Telephone Encounter (Signed)
Medication Samples have been provided to the patient.  Drug name: Actemra       Strength: 162 mg        Qty: 1  LOT: [id number]  Exp.Date: 09/2023  Dosing instructions: Inject 162 mg into the skin every 14 days.

## 2022-12-21 NOTE — Telephone Encounter (Signed)
Patient contacted the office about Actemra. Patient states she was told to pick up her Actemra from our office. Patient inquired how late we will be open. Advised patient we will be open until five o'clock this afternoon. Patient states she is currently at work and plans to come to the office this afternoon to pick up the Actemra.

## 2022-12-22 ENCOUNTER — Other Ambulatory Visit (HOSPITAL_COMMUNITY): Payer: Self-pay

## 2022-12-23 ENCOUNTER — Other Ambulatory Visit (HOSPITAL_COMMUNITY): Payer: Self-pay

## 2022-12-26 ENCOUNTER — Other Ambulatory Visit (HOSPITAL_COMMUNITY): Payer: Self-pay

## 2022-12-28 ENCOUNTER — Other Ambulatory Visit (HOSPITAL_COMMUNITY): Payer: Self-pay

## 2022-12-29 ENCOUNTER — Other Ambulatory Visit (HOSPITAL_COMMUNITY): Payer: Self-pay

## 2022-12-30 ENCOUNTER — Other Ambulatory Visit (HOSPITAL_COMMUNITY): Payer: Self-pay

## 2023-01-02 ENCOUNTER — Other Ambulatory Visit (HOSPITAL_COMMUNITY): Payer: Self-pay

## 2023-01-02 ENCOUNTER — Other Ambulatory Visit: Payer: Self-pay

## 2023-01-02 ENCOUNTER — Encounter: Payer: Self-pay | Admitting: Physician Assistant

## 2023-01-02 ENCOUNTER — Ambulatory Visit: Payer: Commercial Managed Care - PPO | Attending: Physician Assistant | Admitting: Physician Assistant

## 2023-01-02 VITALS — BP 127/79 | HR 55 | Resp 12 | Ht 66.0 in | Wt 192.0 lb

## 2023-01-02 DIAGNOSIS — M25511 Pain in right shoulder: Secondary | ICD-10-CM | POA: Diagnosis not present

## 2023-01-02 DIAGNOSIS — M3500 Sicca syndrome, unspecified: Secondary | ICD-10-CM

## 2023-01-02 DIAGNOSIS — Z8639 Personal history of other endocrine, nutritional and metabolic disease: Secondary | ICD-10-CM | POA: Diagnosis not present

## 2023-01-02 DIAGNOSIS — M19041 Primary osteoarthritis, right hand: Secondary | ICD-10-CM | POA: Diagnosis not present

## 2023-01-02 DIAGNOSIS — M19042 Primary osteoarthritis, left hand: Secondary | ICD-10-CM

## 2023-01-02 DIAGNOSIS — G8929 Other chronic pain: Secondary | ICD-10-CM

## 2023-01-02 DIAGNOSIS — M25512 Pain in left shoulder: Secondary | ICD-10-CM

## 2023-01-02 DIAGNOSIS — R5383 Other fatigue: Secondary | ICD-10-CM

## 2023-01-02 DIAGNOSIS — Z79899 Other long term (current) drug therapy: Secondary | ICD-10-CM

## 2023-01-02 DIAGNOSIS — M79672 Pain in left foot: Secondary | ICD-10-CM

## 2023-01-02 DIAGNOSIS — M79671 Pain in right foot: Secondary | ICD-10-CM | POA: Diagnosis not present

## 2023-01-02 DIAGNOSIS — Z8619 Personal history of other infectious and parasitic diseases: Secondary | ICD-10-CM

## 2023-01-02 DIAGNOSIS — R911 Solitary pulmonary nodule: Secondary | ICD-10-CM

## 2023-01-02 DIAGNOSIS — M25471 Effusion, right ankle: Secondary | ICD-10-CM

## 2023-01-02 DIAGNOSIS — Z8659 Personal history of other mental and behavioral disorders: Secondary | ICD-10-CM | POA: Diagnosis not present

## 2023-01-02 DIAGNOSIS — M0579 Rheumatoid arthritis with rheumatoid factor of multiple sites without organ or systems involvement: Secondary | ICD-10-CM

## 2023-01-02 DIAGNOSIS — Z8679 Personal history of other diseases of the circulatory system: Secondary | ICD-10-CM

## 2023-01-02 NOTE — Patient Instructions (Signed)
Standing Labs We placed an order today for your standing lab work.   Please have your standing labs drawn in May and every 3 months   Please have your labs drawn 2 weeks prior to your appointment so that the provider can discuss your lab results at your appointment, if possible.  Please note that you may see your imaging and lab results in MyChart before we have reviewed them. We will contact you once all results are reviewed. Please allow our office up to 72 hours to thoroughly review all of the results before contacting the office for clarification of your results.  WALK-IN LAB HOURS  Monday through Thursday from 8:00 am -12:30 pm and 1:00 pm-5:00 pm and Friday from 8:00 am-12:00 pm.  Patients with office visits requiring labs will be seen before walk-in labs.  You may encounter longer than normal wait times. Please allow additional time. Wait times may be shorter on  Monday and Thursday afternoons.  We do not book appointments for walk-in labs. We appreciate your patience and understanding with our staff.   Labs are drawn by [facility]. Please bring your co-pay at the time of your lab draw.  You may receive a bill from [facility] for your lab work.  Please note if you are on Hydroxychloroquine and and an order has been placed for a Hydroxychloroquine level,  you will need to have it drawn 4 hours or more after your last dose.  If you wish to have your labs drawn at another location, please call the office 24 hours in advance so we can fax the orders.  The office is located at [address], [address], [place], [place] [address]   If you have any questions regarding directions or hours of operation,  please call [phone].   As a reminder, please drink plenty of water prior to coming for your lab work. Thanks!  

## 2023-01-03 ENCOUNTER — Other Ambulatory Visit (HOSPITAL_COMMUNITY): Payer: Self-pay

## 2023-01-04 NOTE — Telephone Encounter (Signed)
Pt ready for scheduling for Evenity on or after : 01/04/23  Out-of-pocket cost due at time of visit: $450  Primary: [place] - [employer] co-insurance: 20% Admin fee co-insurance: 20%  Secondary: n/a Prolia co-insurance:  Admin fee co-insurance:   Medical Benefit Details: Date Benefits were checked: 12/14/22 Deductible: $500 ($500 met)/ Coinsurance: 20%/ Admin Fee: 20%  Prior Auth: not required PA# Expiration Date:    Pharmacy benefit: Copay $not covered If patient wants fill through the pharmacy benefit please send prescription to: n/a, and include estimated need by date in rx notes. Pharmacy will ship medication directly to the office.  Patient may be eligible for Evenity Copay Card. Copay Card can make patient's cost as little as $25. Link to apply: https://www.amgensupportplus.com/copay  ** This summary of benefits is an estimation of the patient's out-of-pocket cost. Exact cost may very based on individual plan coverage.

## 2023-01-04 NOTE — Telephone Encounter (Signed)
Checking status on this? 

## 2023-01-09 ENCOUNTER — Other Ambulatory Visit (HOSPITAL_COMMUNITY): Payer: Self-pay

## 2023-01-09 ENCOUNTER — Other Ambulatory Visit: Payer: Self-pay

## 2023-01-09 MED ORDER — EVENITY 105 MG/1.17ML ~~LOC~~ SOSY
PREFILLED_SYRINGE | SUBCUTANEOUS | 0 refills | Status: DC
  Filled 2023-01-09: qty 3.51, fill #0

## 2023-01-09 NOTE — Addendum Note (Signed)
Addended by: Thelma Barge D on: 01/09/2023 09:24 AM   Modules accepted: Orders

## 2023-01-09 NOTE — Telephone Encounter (Signed)
Rx sent over to specialty pharmacy and they will get in contact with pt advocate to help with coupon and cost of medication.  Pt advised of this and appt scheduled for next week.  Advised that if any trouble we will try and reschedule.

## 2023-01-11 ENCOUNTER — Other Ambulatory Visit (HOSPITAL_COMMUNITY): Payer: Self-pay

## 2023-01-12 ENCOUNTER — Other Ambulatory Visit (HOSPITAL_COMMUNITY): Payer: Self-pay

## 2023-01-13 ENCOUNTER — Other Ambulatory Visit (HOSPITAL_COMMUNITY): Payer: Self-pay

## 2023-01-13 ENCOUNTER — Other Ambulatory Visit: Payer: Self-pay

## 2023-01-13 ENCOUNTER — Telehealth: Payer: Self-pay | Admitting: Pharmacist

## 2023-01-13 ENCOUNTER — Ambulatory Visit: Payer: Commercial Managed Care - PPO | Attending: Family Medicine | Admitting: Pharmacist

## 2023-01-13 DIAGNOSIS — Z79899 Other long term (current) drug therapy: Secondary | ICD-10-CM

## 2023-01-13 MED ORDER — EVENITY 105 MG/1.17ML ~~LOC~~ SOSY
PREFILLED_SYRINGE | SUBCUTANEOUS | 0 refills | Status: DC
  Filled 2023-01-13: qty 2.34, 28d supply, fill #0

## 2023-01-13 NOTE — Progress Notes (Signed)
Pharmacy Note  Subjective:  Patient presents today for CHEP visit. Patient is being seen by the pharmacist for counseling on Evenity.   History of cardiovascular events in the past 12 months? No - she has no clinical ASCVD listed, however, she does have a hx of LBBB. Echo from 06/2022 showed normal LVEF. She has a CAC/Coronary CT scheduled for 01/23/23.  Objective: CMP     Component Value Date/Time   NA 138 11/28/2022 1045   NA 139 02/22/2022 0949   K 4.1 11/28/2022 1045   CL 103 11/28/2022 1045   CO2 29 11/28/2022 1045   GLUCOSE 111 (H) 11/28/2022 1045   BUN 19 11/28/2022 1045   BUN 14 02/22/2022 0949   CREATININE 0.65 11/28/2022 1045   CALCIUM 9.4 11/28/2022 1045   PROT 6.7 11/28/2022 1045   PROT 7.1 02/22/2022 0949   ALBUMIN 4.7 02/22/2022 0949   AST 13 11/28/2022 1045   ALT 26 11/28/2022 1045   ALKPHOS 81 02/22/2022 0949   BILITOT 0.5 11/28/2022 1045   BILITOT 0.6 02/22/2022 0949   GFRNONAA 74 01/07/2021 1335   GFRAA 86 01/07/2021 1335    Vitamin D Lab Results  Component Value Date   VD25OH 30.1 11/28/2022    DEXA scan, most current DEXA scan T-score is minus 2.6, confirming osteoporosis. No hx of recent fracture.   Assessment/Plan:  Counseled patient on purpose, proper use, and adverse effects of Evenity.  Counseled patient that Sheilah Mins is a medication that must be injected once monthly for 12 months by a healthcare professional.  Advised patient to take calcium 1200 mg daily and vitamin D 800 units daily.  Reviewed the most common adverse effects of Evenity including headache, osteonecrosis of the jaw, and muscle/bone pain.  Reviewed that Sheilah Mins has FDA boxed warning for major adverse cardiovascular events. Reviewed that Evenity may increase the risk of myocardial infarction, stroke, and cardiovascular death. Confirmed that patient has not had MI or stroke within the preceding year  Patient confirms she does not have any major dental work planned at this time.  Reviewed  with patient the signs/symptoms of low calcium and advised patient to alert [place] if she experiences these symptoms.  Provided patient with medication education material and answered all questions.  Butch Penny, PharmD, Patsy [place], CPP Clinical Pharmacist [facility] [phone]

## 2023-01-13 NOTE — Telephone Encounter (Signed)
Called patient to schedule an appointment for the [employer] [facility]. I was unable to reach the patient so I left a HIPAA-compliant message requesting that the patient return my call.   Luke Van Ausdall, PharmD, BCACP, CPP Clinical Pharmacist [facility] [phone]  

## 2023-01-16 ENCOUNTER — Other Ambulatory Visit (HOSPITAL_COMMUNITY): Payer: Self-pay

## 2023-01-16 ENCOUNTER — Other Ambulatory Visit: Payer: Self-pay

## 2023-01-17 ENCOUNTER — Encounter (HOSPITAL_COMMUNITY): Payer: Self-pay

## 2023-01-17 ENCOUNTER — Other Ambulatory Visit (HOSPITAL_COMMUNITY): Payer: Self-pay

## 2023-01-17 ENCOUNTER — Other Ambulatory Visit: Payer: Self-pay

## 2023-01-18 ENCOUNTER — Other Ambulatory Visit (HOSPITAL_COMMUNITY): Payer: Self-pay

## 2023-01-18 ENCOUNTER — Other Ambulatory Visit: Payer: Self-pay

## 2023-01-18 ENCOUNTER — Encounter (HOSPITAL_COMMUNITY): Payer: Self-pay

## 2023-01-18 ENCOUNTER — Ambulatory Visit (INDEPENDENT_AMBULATORY_CARE_PROVIDER_SITE_OTHER): Payer: Commercial Managed Care - PPO

## 2023-01-18 DIAGNOSIS — M19041 Primary osteoarthritis, right hand: Secondary | ICD-10-CM

## 2023-01-18 DIAGNOSIS — M19042 Primary osteoarthritis, left hand: Secondary | ICD-10-CM | POA: Diagnosis not present

## 2023-01-18 MED ORDER — ROMOSOZUMAB-AQQG 105 MG/1.17ML ~~LOC~~ SOSY
210.0000 mg | PREFILLED_SYRINGE | Freq: Once | SUBCUTANEOUS | Status: AC
  Administered 2023-01-18: 210 mg via SUBCUTANEOUS

## 2023-01-18 NOTE — Progress Notes (Signed)
Sharon Hunter is a 62 y.o. female presents to the office today for Evenity injections, per physician's orders. Original order: Dr. Chilton Greathouse (med), was administered both [place] (location) today. Patient tolerated injection.  Patient next injection due: 02/16/23, appt made Yes Dotsie Gillette M Robel Wuertz

## 2023-01-19 ENCOUNTER — Other Ambulatory Visit: Payer: Self-pay

## 2023-01-19 ENCOUNTER — Other Ambulatory Visit: Payer: Self-pay | Admitting: *Deleted

## 2023-01-19 ENCOUNTER — Other Ambulatory Visit (HOSPITAL_COMMUNITY): Payer: Self-pay

## 2023-01-19 MED ORDER — EVENITY 105 MG/1.17ML ~~LOC~~ SOSY
PREFILLED_SYRINGE | SUBCUTANEOUS | 0 refills | Status: DC
  Filled 2023-01-19: qty 2.4, fill #0
  Filled 2023-02-07: qty 2.4, 28d supply, fill #0

## 2023-01-23 ENCOUNTER — Ambulatory Visit (HOSPITAL_BASED_OUTPATIENT_CLINIC_OR_DEPARTMENT_OTHER)
Admission: RE | Admit: 2023-01-23 | Discharge: 2023-01-23 | Disposition: A | Discharge status: Home / Self Care | Payer: Self-pay | Source: Ambulatory Visit | Attending: Cardiovascular Disease | Admitting: Cardiovascular Disease

## 2023-01-23 DIAGNOSIS — I10 Essential (primary) hypertension: Secondary | ICD-10-CM | POA: Insufficient documentation

## 2023-01-23 DIAGNOSIS — E782 Mixed hyperlipidemia: Secondary | ICD-10-CM | POA: Insufficient documentation

## 2023-01-27 ENCOUNTER — Other Ambulatory Visit (HOSPITAL_COMMUNITY): Payer: Self-pay

## 2023-01-27 ENCOUNTER — Other Ambulatory Visit: Payer: Self-pay | Admitting: Rheumatology

## 2023-01-27 DIAGNOSIS — Z5181 Encounter for therapeutic drug level monitoring: Secondary | ICD-10-CM

## 2023-01-27 DIAGNOSIS — Z79899 Other long term (current) drug therapy: Secondary | ICD-10-CM

## 2023-01-27 DIAGNOSIS — M0579 Rheumatoid arthritis with rheumatoid factor of multiple sites without organ or systems involvement: Secondary | ICD-10-CM

## 2023-01-27 NOTE — Telephone Encounter (Signed)
Last Fill: 10/17/2022  Labs: 11/28/2022 LDL is elevated and cholesterol are elevated.  CBC and CMP are normal.  Please forward results to her PCP.   TB Gold: 07/27/2022  negative  Next Visit: 03/16/2023  Last Visit: 01/02/2023  GN:[id number] arthritis involving multiple sites with positive rheumatoid factor   Current Dose per office note 01/02/2023:  Actemra 162 mg sq every 14 days.    Okay to refill Actemra?

## 2023-01-30 ENCOUNTER — Other Ambulatory Visit (HOSPITAL_COMMUNITY): Payer: Self-pay

## 2023-01-30 ENCOUNTER — Other Ambulatory Visit: Payer: Self-pay

## 2023-01-30 MED ORDER — ACTEMRA ACTPEN 162 MG/0.9ML ~~LOC~~ SOAJ
162.0000 mg | SUBCUTANEOUS | 2 refills | Status: DC
  Filled 2023-01-30: qty 1.8, 28d supply, fill #0
  Filled 2023-02-24: qty 1.8, 28d supply, fill #1
  Filled 2023-03-23: qty 1.8, 28d supply, fill #2

## 2023-02-01 ENCOUNTER — Telehealth (INDEPENDENT_AMBULATORY_CARE_PROVIDER_SITE_OTHER): Payer: Self-pay | Admitting: Family Medicine

## 2023-02-03 ENCOUNTER — Other Ambulatory Visit (HOSPITAL_BASED_OUTPATIENT_CLINIC_OR_DEPARTMENT_OTHER): Payer: Self-pay

## 2023-02-03 ENCOUNTER — Ambulatory Visit: Payer: Commercial Managed Care - PPO | Admitting: Family Medicine

## 2023-02-03 ENCOUNTER — Encounter: Payer: Self-pay | Admitting: Family Medicine

## 2023-02-03 VITALS — BP 121/72 | HR 67 | Temp 98.2°F | Ht 66.0 in | Wt 187.0 lb

## 2023-02-03 DIAGNOSIS — J029 Acute pharyngitis, unspecified: Secondary | ICD-10-CM

## 2023-02-03 DIAGNOSIS — H6993 Unspecified Eustachian tube disorder, bilateral: Secondary | ICD-10-CM | POA: Diagnosis not present

## 2023-02-03 DIAGNOSIS — H9203 Otalgia, bilateral: Secondary | ICD-10-CM | POA: Diagnosis not present

## 2023-02-03 LAB — POCT RAPID STREP A (OFFICE): Rapid Strep A Screen: NEGATIVE

## 2023-02-03 MED ORDER — LIDOCAINE VISCOUS HCL 2 % MT SOLN
15.0000 mL | OROMUCOSAL | 0 refills | Status: DC | PRN
Start: 2023-02-03 — End: 2023-07-31
  Filled 2023-02-03: qty 100, 1d supply, fill #0

## 2023-02-03 NOTE — Patient Instructions (Signed)
Likely viral pharyngitis. Strep test negative.  Discussed supportive measures. Adding lidocaine as needed.  For your ears - we often start with daily Flonase and will sometimes add a short course of prednisone - let me know if you decide you want to try. We could always see ENT if symptoms are not improving.

## 2023-02-03 NOTE — Progress Notes (Signed)
Acute Office Visit  Subjective:     Patient ID: Sharon Hunter, female    DOB: 21-Jul-1961, 62 y.o.   MRN: [id number]  Chief Complaint  Patient presents with   Sore Throat   Ear Pain     Patient is in today for ear pain/pressure and sore throat.    Patient reports she has been having 3 months of ear pressure/pain, that comes intermittently. She will sometimes have a "wet feeling" in her ears. Most recently she has had a bout 2 days of worsening ear pain and sore throat associated with headache. Pain is 4/10. She denies fevers,  body aches, rhinorrhea, nasal congestion, sinus pressure, couging ,sneezing, chest pain, dypsnea, wheezing, GI/GU. No known sick contacts. Declined COVID testing as this does not feel like the last time she had COVID.     ROS All review of systems negative except what is listed in the HPI      Objective:    BP 121/72   Pulse 67   Temp 98.2 F (36.8 C) (Oral)   Ht 5\' 6"  (1.676 m)   Wt 187 lb (84.8 kg)   LMP 10/03/2005   SpO2 99%   BMI 30.18 kg/m    Physical Exam Vitals reviewed.  Constitutional:      Appearance: Normal appearance.  HENT:     Head: Normocephalic and atraumatic.     Ears:     Comments: Mild bilateral effusions    Nose: No congestion or rhinorrhea.     Mouth/Throat:     Mouth: Mucous membranes are dry.     Pharynx: Posterior oropharyngeal erythema present. No pharyngeal swelling or oropharyngeal exudate.  Cardiovascular:     Rate and Rhythm: Normal rate and regular rhythm.     Pulses: Normal pulses.     Heart sounds: Normal heart sounds.  Pulmonary:     Effort: Pulmonary effort is normal.     Breath sounds: Normal breath sounds.  Skin:    General: Skin is warm and dry.  Neurological:     Mental Status: She is alert and oriented to person, place, and time.  Psychiatric:        Mood and Affect: Mood normal.        Behavior: Behavior normal.        Thought Content: Thought content normal.        Judgment: Judgment  normal.        Results for orders placed or performed in visit on 02/03/23  POCT rapid strep A  Result Value Ref Range   Rapid Strep A Screen Negative Negative        Assessment & Plan:   Problem List Items Addressed This Visit   None Visit Diagnoses     Sore throat    -  Primary   Relevant Medications   lidocaine (XYLOCAINE) 2 % solution   Other Relevant Orders   POCT rapid strep A (Completed)   Ear pain, bilateral       Acute viral pharyngitis       Relevant Medications   lidocaine (XYLOCAINE) 2 % solution   Dysfunction of both eustachian tubes         Likely viral pharyngitis. Strep test negative.  Discussed supportive measures. Adding lidocaine as needed.  For your ears - we often start with daily Flonase and will sometimes add a short course of prednisone - let me know if you decide you want to try. We could always see ENT if  symptoms are not improving.     Meds ordered this encounter  Medications   lidocaine (XYLOCAINE) 2 % solution    Sig: Use as directed 15 mLs in the mouth or throat every 4 (four) hours as needed for mouth pain.    Dispense:  100 mL    Refill:  0    Order Specific Question:   Supervising Provider    Answer:   Danise Edge A [[id number]]    Return if symptoms worsen or fail to improve.  Clayborne Dana, NP

## 2023-02-04 ENCOUNTER — Encounter: Payer: Self-pay | Admitting: Cardiovascular Disease

## 2023-02-04 NOTE — Progress Notes (Unsigned)
Cardiology Office Note:    Date:  02/07/2023   ID:  [name], DOB [birth year], MRN [id number]  PCP:  Zola Button, Grayling Congress, DO   [facility] Providers Cardiologist:  Aliany Fiorenza    Referring MD: Zola Button, Grayling Congress, *   Chief Complaint  Patient presents with   LBBB   Hypertension        Hyperlipidemia    History of Present Illness:    Sharon Hunter is a 62 y.o. female with a hx of hyperlipidemia and LBBB, HTN   I saw Sharon Hunter several years ago for rate related LBBB  Has a hx of HTN and HLD   Echo from Sept. 8, 2023  reveals nl LV function, mild MR   Hiking some ,  walking some  Needs to get more .   Father developed CAD in his 70s Sister developed CAD in her 63 s   Has some DOE with exertion  Ie walking into work .  Not doing spin class or other   Had an allergic response to IV contrast ( 40 years ago )  Eats seafood all the time  Will premedicate prior to coronary CTA   October 27, 2022: Sharon Hunter  is seen today for follow-up visit.  She has a history of left bundle branch block.  She has a history of hypertension and hyperlipidemia. We originally had scheduled her for a coronary CT angiogram last year but it turned out it was going to be too expensive for her and we agreed to postpone that test.  She did have an echocardiogram on June 10, 2022.  This revealed a normal LVEF of 60 to 65%.  Her diastolic parameters were normal. She has mild mitral regurgitation.  Still having lots of issues with her RA .  Trying new meds.   BP ranges from 120-140 / 70-80  Does not like to take the HCTZ  - she has cut her HCTZ  Is on amlodipine   Was well controlled on Losartan but it did not work well after she gained some weight   Is losing weight - going to the [facility] weight loss program    Feb 07, 2023  Sharon Hunter is seen for follow up of her HTN, HLD, LBBB  Coronary calcium score is 37.8 ( all in the LAD). This is 79th percentile for age /sex matched controls    Lipids from Feb. 2024 Chol = 268 HDL = 105 LDL = 138 Trigs = 125  Has been on steroids for a while Is trying new meds for her arthitis  Mostly in her hands   Wants to go to a weight loss clinic  ? Phentermine   Has CAC. 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   Hypertension    IBS (irritable bowel syndrome) 08/01/2016   Joint pain    Lactose intolerance    Migraine    Murmur 02/03/2011   Obese    Ovarian failure    Palpitation    Pulmonary nodule, left 2009   last CT Nov 2011-- due 08/2011   Rheumatoid arthritis(714.0)    in remission , off  meds as of Summer 2012   Vitamin D deficiency     Past Surgical History:  Procedure Laterality Date   CHOLECYSTECTOMY  1999   COLONOSCOPY  10/31/11   EXPLORATORY LAPAROTOMY     TONSILLECTOMY  1969   WISDOM TOOTH EXTRACTION      Current Medications: Current Meds  Medication Sig   amLODipine (NORVASC) 5 MG tablet Take 1 tablet (5 mg total) by mouth daily.   Cholecalciferol 125 MCG (5000 UT) CHEW Chew 1 tablet by mouth daily at 6 (six) AM.   cycloSPORINE, PF, (CEQUA) 0.09 % SOLN Place 1 drop into both eyes 2 (two) times daily.   diclofenac Sodium (VOLTAREN) 1 % GEL Apply 2-4 grams to affected area up to 4 times daily.   Doxylamine Succinate, Sleep, (UNISOM PO) Take 1 tablet by mouth at bedtime.   hydrochlorothiazide (HYDRODIURIL) 25 MG tablet Take 1/2 tablet (12.5 mg total) by mouth daily.   Ibuprofen (MOTRIN PO) Take by mouth as needed.   lidocaine (XYLOCAINE) 2 % solution Use as directed 15 mLs in the mouth or throat every 4 (four) hours as needed for mouth pain.   Magnesium 200 MG CHEW Chew by mouth.   MELATONIN PO Take 5-10 mg by mouth at bedtime.   Multiple  Vitamin (MULTIVITAMIN PO) Take by mouth daily.   Romosozumab-aqqg (EVENITY) 105 MG/1. SOSY injection To be given at office.  Pt appt is 02/16/23   rosuvastatin (CRESTOR) 10 MG tablet Take 1 tablet (10 mg total) by mouth daily.   sertraline (ZOLOFT) 50 MG tablet Take 1 tablet (50 mg total) by mouth daily.   Tocilizumab (ACTEMRA ACTPEN) 162 MG/0.9ML SOAJ Inject 162 mg into the skin every 14 (fourteen) days.   valsartan (DIOVAN) 80 MG tablet Take 1 tablet (80 mg total) by mouth daily.     Allergies:   Other and Contrast media [iodinated contrast media]   Social History   Socioeconomic History   Marital status: Single    Spouse name: Not on file   Number of children: 1   Years of education: 17   Highest education level: Not on file  Occupational History   Occupation: RN  Tobacco Use   Smoking status: Former    Types: Cigarettes    Quit date: 10/03/1990    Years since quitting: 32.3    Passive exposure: Past   Smokeless tobacco: Never  Vaping Use   Vaping Use: Never used  Substance and Sexual Activity   Alcohol use: Yes    Alcohol/week: 3.0 standard drinks of alcohol    Types: 3 Standard drinks or equivalent per week    Comment: Red wine 4 oz occ   Drug use: No   Sexual activity: Not Currently    Partners: Male  Other Topics Concern   Not on file  Social History Narrative   Exercise-- jogs   Social Determinants of Health   Financial Resource Strain: Not on file  Food Insecurity: Not on file  Transportation Needs: Not on file  Physical Activity: Not on file  Stress: Not on file  Social Connections: Not on file     Family History: The patient's family history includes Alcohol abuse in her brother and father; Arthritis in her mother; Cancer in her brother and maternal aunt; Diabetes in her brother, mother, and sister; Heart disease in her father and sister; High blood pressure in her father; Hyperlipidemia in her father and sister; Hypertension in her brother, mother,  and sister; Sleep apnea in  her father; Uterine cancer in her mother.  ROS:   Please see the history of present illness.     All other systems reviewed and are negative.  EKGs/Labs/Other Studies Reviewed:    The following studies were reviewed today:   EKG:     Recent Labs: 02/22/2022: TSH 0.753 11/28/2022: ALT 26; BUN 19; Creat 0.65; Hemoglobin 12.9; Platelets 264; Potassium 4.1; Sodium 138  Recent Lipid Panel    Component Value Date/Time   CHOL 268 (H) 11/28/2022 1045   CHOL 273 (H) 11/28/2022 1023   TRIG 125 11/28/2022 1045   HDL 105 11/28/2022 1045   HDL 108 11/28/2022 1023   CHOLHDL 2.6 11/28/2022 1045   VLDL 20.0 10/29/2021 0950   LDLCALC 138 (H) 11/28/2022 1045   LDLCALC 145 (H) 11/28/2022 1023   LDLDIRECT 132.6 05/23/2013 1141     Risk Assessment/Calculations:      HYPERTENSION CONTROL Vitals:   02/07/23 1615 02/07/23 1712  BP: (!) 150/88 (!) 150/80    The patient's blood pressure is elevated above target today.  In order to address the patient's elevated BP: Blood pressure will be monitored at home to determine if medication changes need to be made.            Physical Exam:     Physical Exam: Blood pressure (!) 150/80, pulse 63, height 5' 6.5" (1.689 m), weight 187 lb 3.2 oz (84.9 kg), last menstrual period 10/03/2005, SpO2 95 %.  HYPERTENSION CONTROL Vitals:   02/07/23 1615 02/07/23 1712  BP: (!) 150/88 (!) 150/80    The patient's blood pressure is elevated above target today.  In order to address the patient's elevated BP: Blood pressure will be monitored at home to determine if medication changes need to be made.    Her home BP readings have been good.  BP readings at her primary MD look good     GEN:  Well nourished, well developed in no acute distress HEENT: Normal NECK: No JVD; No carotid bruits LYMPHATICS: No lymphadenopathy CARDIAC: RRR , soft systolic murmur at LSB . No radiation out to L ax line  RESPIRATORY:  Clear to  auscultation without rales, wheezing or rhonchi  ABDOMEN: Soft, non-tender, non-distended MUSCULOSKELETAL:  No edema; No deformity  SKIN: Warm and dry NEUROLOGIC:  Alert and oriented x 3    ASSESSMENT:    1. Mixed hyperlipidemia   2. Medication management     PLAN:      LBBB:    no dyspnea, no cp  We have not done an ischemia evaluation yet .   She deferred for now due to cost   2.  Hypertension: Blood pressure is typically well-controlled.  Continue current medications.  3.  Hyperlipidemia :   Her LDL is 138.  Given her coronary artery calcifications I would like for her LDL to be between 50 and 70.  She agrees to try rosuvastatin 10 mg a day.  If she develops any symptoms or muscle aches we will have a low threshold to refer her to the lipid clinic for consideration of a PCSK9 inhibitor or inclisiran.  4.  Obesity:   Sharon Hunter would like to lose 20-25 lbs.   Price of oral Semaglutide is very high ( $1200 / month )  She will continue to work on diet . She is limited in the amount of exercise she can do because of her severe rheumatoid arthritis.     Medication Adjustments/Labs and Tests Ordered: Current medicines are reviewed at  length with the patient today.  Concerns regarding medicines are outlined above.  Orders Placed This Encounter  Procedures   Lipid panel   ALT   Basic metabolic panel   Meds ordered this encounter  Medications   rosuvastatin (CRESTOR) 10 MG tablet    Sig: Take 1 tablet (10 mg total) by mouth daily.    Dispense:  90 tablet    Refill:  3     Patient Instructions  Medication Instructions:  START Crestor (Rosuvastatin) 10mg  daily  *If you need a refill on your cardiac medications before your next appointment, please call your pharmacy*   Lab Work: Lipids, ALT, BMET in 3 months If you have labs (blood work) drawn today and your tests are completely normal, you will receive your results only by: MyChart Message (if you have MyChart) OR A paper  copy in the mail If you have any lab test that is abnormal or we need to change your treatment, we will call you to review the results.   Testing/Procedures: NONE   Follow-Up: At [facility], you and your health needs are our priority.  As part of our continuing mission to provide you with exceptional heart care, we have created designated Provider Care Teams.  These Care Teams include your primary Cardiologist (physician) and Advanced Practice Providers (APPs -  Physician Assistants and Nurse Practitioners) who all work together to provide you with the care you need, when you need it.  We recommend signing up for the patient portal called "MyChart".  Sign up information is provided on this After Visit Summary.  MyChart is used to connect with patients for Virtual Visits (Telemedicine).  Patients are able to view lab/test results, encounter notes, upcoming appointments, etc.  Non-urgent messages can be sent to your provider as well.   To learn more about what you can do with MyChart, go to [email or web address].    Your next appointment:   3 month(s)  Provider:   Kristeen Miss, MD        Signed, Kristeen Miss, MD  02/07/2023 6:09 PM    [facility]

## 2023-02-07 ENCOUNTER — Encounter: Payer: Self-pay | Admitting: Cardiovascular Disease

## 2023-02-07 ENCOUNTER — Ambulatory Visit: Payer: Commercial Managed Care - PPO | Attending: Cardiovascular Disease | Admitting: Cardiovascular Disease

## 2023-02-07 ENCOUNTER — Other Ambulatory Visit: Payer: Self-pay

## 2023-02-07 ENCOUNTER — Other Ambulatory Visit: Payer: Self-pay | Admitting: Pharmacist

## 2023-02-07 ENCOUNTER — Other Ambulatory Visit (HOSPITAL_COMMUNITY): Payer: Self-pay

## 2023-02-07 VITALS — BP 150/80 | HR 63 | Ht 66.5 in | Wt 187.2 lb

## 2023-02-07 DIAGNOSIS — Z79899 Other long term (current) drug therapy: Secondary | ICD-10-CM

## 2023-02-07 DIAGNOSIS — E782 Mixed hyperlipidemia: Secondary | ICD-10-CM

## 2023-02-07 MED ORDER — ROSUVASTATIN CALCIUM 10 MG PO TABS
10.0000 mg | ORAL_TABLET | Freq: Every day | ORAL | 3 refills | Status: DC
Start: 2023-02-07 — End: 2023-12-19
  Filled 2023-02-07: qty 90, 90d supply, fill #0
  Filled 2023-04-20 – 2023-05-04 (×2): qty 90, 90d supply, fill #1
  Filled 2023-08-07: qty 90, 90d supply, fill #2

## 2023-02-07 MED ORDER — EVENITY 105 MG/1.17ML ~~LOC~~ SOSY
PREFILLED_SYRINGE | SUBCUTANEOUS | 0 refills | Status: DC
  Filled 2023-02-07: qty 2.34, 28d supply, fill #0

## 2023-02-07 NOTE — Patient Instructions (Signed)
Medication Instructions:  START Crestor (Rosuvastatin) 10mg  daily  *If you need a refill on your cardiac medications before your next appointment, please call your pharmacy*   Lab Work: Lipids, ALT, BMET in 3 months If you have labs (blood work) drawn today and your tests are completely normal, you will receive your results only by: MyChart Message (if you have MyChart) OR A paper copy in the mail If you have any lab test that is abnormal or we need to change your treatment, we will call you to review the results.   Testing/Procedures: NONE   Follow-Up: At [facility], you and your health needs are our priority.  As part of our continuing mission to provide you with exceptional heart care, we have created designated Provider Care Teams.  These Care Teams include your primary Cardiologist (physician) and Advanced Practice Providers (APPs -  Physician Assistants and Nurse Practitioners) who all work together to provide you with the care you need, when you need it.  We recommend signing up for the patient portal called "MyChart".  Sign up information is provided on this After Visit Summary.  MyChart is used to connect with patients for Virtual Visits (Telemedicine).  Patients are able to view lab/test results, encounter notes, upcoming appointments, etc.  Non-urgent messages can be sent to your provider as well.   To learn more about what you can do with MyChart, go to [email or web address].    Your next appointment:   3 month(s)  Provider:   Kristeen Miss, MD

## 2023-02-08 ENCOUNTER — Other Ambulatory Visit: Payer: Self-pay

## 2023-02-08 ENCOUNTER — Other Ambulatory Visit (HOSPITAL_COMMUNITY): Payer: Self-pay

## 2023-02-09 ENCOUNTER — Encounter: Payer: Self-pay | Admitting: Cardiovascular Disease

## 2023-02-09 NOTE — Progress Notes (Unsigned)
  Cardiology Office Note:    Date:  02/09/2023   ID:  [name], DOB May 16, 1961, MRN [id number]  PCP:  Donato Schultz, DO    Sharon Hunter was seen on Feb 07, 2023 for follow up of her LBBB, HTN, HLD.  She has requested a medication to help with weight loss.   Reginal Lutes is no longer an option as most insurance companies are not covering [id number] for weight loss.  We discussed Phentermine and the possible side effects  Alvino Chapel has a LBBB. Echo shows normal LV function with mild MR  Her BP at home is normal  BP is 112/70 this am. Her HR is normal   Sharon Hunter is at low risk for taking Phentermine. She will keep an eye on her HR and BP . I will see her again in 3 months, sooner if needed.  I have encouraged her to contact me if she starts to have any cardiac side effects from the Phentermine .      Signed, Kristeen Miss, MD  02/09/2023 8:22 AM    [facility]

## 2023-02-16 ENCOUNTER — Other Ambulatory Visit: Payer: Self-pay

## 2023-02-16 ENCOUNTER — Other Ambulatory Visit (HOSPITAL_COMMUNITY): Payer: Self-pay

## 2023-02-16 ENCOUNTER — Ambulatory Visit (INDEPENDENT_AMBULATORY_CARE_PROVIDER_SITE_OTHER): Payer: Commercial Managed Care - PPO | Admitting: *Deleted

## 2023-02-16 ENCOUNTER — Other Ambulatory Visit: Payer: Self-pay | Admitting: *Deleted

## 2023-02-16 DIAGNOSIS — M810 Age-related osteoporosis without current pathological fracture: Secondary | ICD-10-CM

## 2023-02-16 MED ORDER — ROMOSOZUMAB-AQQG 105 MG/1.17ML ~~LOC~~ SOSY
210.0000 mg | PREFILLED_SYRINGE | Freq: Once | SUBCUTANEOUS | Status: AC
Start: 2023-02-16 — End: 2023-02-16
  Administered 2023-02-16: 210 mg via SUBCUTANEOUS

## 2023-02-16 MED ORDER — EVENITY 105 MG/1.17ML ~~LOC~~ SOSY
PREFILLED_SYRINGE | SUBCUTANEOUS | 0 refills | Status: DC
Start: 2023-02-16 — End: 2023-03-21
  Filled 2023-02-16: qty 2.4, fill #0
  Filled 2023-02-24: qty 2.34, 28d supply, fill #0

## 2023-02-16 NOTE — Progress Notes (Addendum)
Patient here for 2/12 Evenity injection per Dr. Zola Button.  Injection given in left and right arm.  Patient tolerated well.    Next injection scheduled for 03/16/23.

## 2023-02-24 ENCOUNTER — Other Ambulatory Visit: Payer: Self-pay

## 2023-02-24 ENCOUNTER — Other Ambulatory Visit (HOSPITAL_COMMUNITY): Payer: Self-pay

## 2023-02-28 ENCOUNTER — Other Ambulatory Visit (HOSPITAL_COMMUNITY): Payer: Self-pay

## 2023-03-02 NOTE — Progress Notes (Signed)
Office Visit Note  Patient: Sharon Hunter             Date of Birth: 12/13/1960           MRN: [id number]             PCP: Donato Schultz, DO Referring: Donato Schultz, * Visit Date: 03/16/2023 Occupation: @GUAROCC @  Subjective:  Discuss treatment options   History of Present Illness: Sharon Hunter is a 62 y.o. female with history of seropositive rheumatoid arthritis.  Patient remains on  Actemra 162 mg sq every 14 days-started on 10/17/22.  Patient states that she is due for next dose of Actemra on Sunday.  She states that she has not found Actemra to be efficacious at managing her rheumatoid arthritis.  She continues to have frequent flares.  She states with the last Actemra dose she had relief for only 1 day.  She has been experiencing pain and swelling in both hands and both wrist joints.  She has difficulty making complete fist.  She recently had a flare involving the left foot and has had ongoing pain in both shoulders at night.  Her disease is not currently well-controlled taking Actemra.     Activities of Daily Living:  Patient reports morning stiffness for all day.  Patient Reports nocturnal pain.  Difficulty dressing/grooming: Reports Difficulty climbing stairs: Denies Difficulty getting out of chair: Denies Difficulty using hands for taps, buttons, cutlery, and/or writing: Reports  Review of Systems  Constitutional:  Positive for fatigue.  HENT:  Positive for mouth dryness. Negative for mouth sores.   Eyes:  Positive for dryness.  Respiratory:  Negative for shortness of breath.   Cardiovascular:  Negative for chest pain and palpitations.  Gastrointestinal:  Negative for blood in stool, constipation and diarrhea.  Endocrine: Negative for increased urination.  Genitourinary:  Negative for involuntary urination.  Musculoskeletal:  Positive for joint pain, gait problem, joint pain, joint swelling and morning stiffness. Negative for myalgias, muscle weakness,  muscle tenderness and myalgias.  Skin:  Negative for color change, rash, hair loss and sensitivity to sunlight.  Allergic/Immunologic: Negative for susceptible to infections.  Neurological:  Negative for dizziness and headaches.  Hematological:  Negative for swollen glands.  Psychiatric/Behavioral:  Positive for sleep disturbance. Negative for depressed mood. The patient is not nervous/anxious.     PMFS History:  Patient Active Problem List   Diagnosis Date Noted   Mood disorder (HCC)- Emotional Eating 08/02/2022   Class 1 obesity with serious comorbidity and body mass index (BMI) of 30.0 to 30.9 in adult 08/02/2022   At risk for impaired metabolic function 05/30/2022   Perioral dermatitis 05/26/2022   Preventative health care 05/26/2022   Essential hypertension 05/26/2022   Abnormal EKG 05/26/2022   Prediabetes 03/09/2022   Vitamin B12 deficiency 03/09/2022   Primary hypertension 02/22/2022   Hyperlipidemia 02/22/2022   GAD (generalized anxiety disorder) 02/22/2022   Vitamin D deficiency 02/22/2022   Ear pain, left 08/13/2021   History of anxiety 03/27/2017   History of hypertension 03/27/2017   History of bundle branch block 03/27/2017   Primary osteoarthritis of both hands 12/22/2016   History of shingles 08/02/2016   High risk medication use 08/02/2016   Hypertension 08/01/2016   IBS (irritable bowel syndrome) 08/01/2016   Anxiety 08/01/2016   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   LBBB (left bundle branch block) 01/30/2015   Palpitations 01/30/2015   Pulmonary nodule 07/04/2011   Murmur 02/03/2011  Rheumatoid arthritis (HCC) 02/03/2011   Hair loss 02/03/2011   Fatigue 02/03/2011    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   Hypertension    IBS (irritable bowel syndrome) 08/01/2016   Joint pain    Lactose intolerance    Migraine    Murmur  02/03/2011   Obese    Ovarian failure    Palpitation    Pulmonary nodule, left 2009   last CT Nov 2011-- due 08/2011   Rheumatoid arthritis(714.0)    in remission , off meds as of Summer 2012   Vitamin D deficiency     Family History  Problem Relation Age of Onset   Arthritis Mother    Uterine cancer Mother    Hypertension Mother    Diabetes Mother    Alcohol abuse Father    Hyperlipidemia Father    Heart disease Father    High blood pressure Father    Sleep apnea Father    Heart disease Sister        cardiac stent   Diabetes Sister    Hyperlipidemia Sister    Hypertension Sister    Alcohol abuse Brother    Diabetes Brother    Hypertension Brother    Cancer Brother        lung   Cancer Maternal Aunt        lung----non smoker   Past Surgical History:  Procedure Laterality Date   CHOLECYSTECTOMY  1999   COLONOSCOPY  10/31/11   EXPLORATORY LAPAROTOMY     TONSILLECTOMY  1969   WISDOM TOOTH EXTRACTION     Social History   Social History Narrative   Exercise-- jogs   Immunization History  Administered Date(s) Administered   Influenza Split 07/03/2013   Influenza,inj,Quad PF,6-35 Mos 07/17/2020   Influenza-Unspecified 07/03/2014, 07/04/2015   PFIZER(Purple Top)SARS-COV-2 Vaccination 01/13/2020, 05/15/2020   Tdap 12/01/2009     Objective: Vital Signs: BP 127/75 (BP Location: Left Arm, Patient Position: Sitting, Cuff Size: Normal)   Pulse 73   Resp 16   Ht 5' 6.5" (1.689 m)   Wt 188 lb 3.2 oz (85.4 kg)   LMP 10/03/2005   BMI 29.92 kg/m    Physical Exam Vitals and nursing note reviewed.  Constitutional:      Appearance: She is well-developed.  HENT:     Head: Normocephalic and atraumatic.  Eyes:     Conjunctiva/sclera: Conjunctivae normal.  Cardiovascular:     Rate and Rhythm: Normal rate and regular rhythm.     Heart sounds: Normal heart sounds.  Pulmonary:     Effort: Pulmonary effort is normal.     Breath sounds: Normal breath sounds.   Abdominal:     General: Bowel sounds are normal.     Palpations: Abdomen is soft.  Musculoskeletal:     Cervical back: Normal range of motion.  Lymphadenopathy:     Cervical: No cervical adenopathy.  Skin:    General: Skin is warm and dry.     Capillary Refill: Capillary refill takes less than 2 seconds.  Neurological:     Mental Status: She is alert and oriented to person, place, and time.  Psychiatric:        Behavior: Behavior normal.      Musculoskeletal Exam: C-spine has good range of motion.  Painful range of motion of both shoulder joints.  Elbow joints have good  range of motion with no tenderness or inflammation.  Tenderness over both wrist joints.  Synovitis in the left wrist noted.  Tenderness and synovitis of several MCP and PIP joints most severe in the left hand.  Hip joints have good range of motion with no groin pain currently.  Knee joints have good range of motion with no warmth or effusion.  Synovial thickening and tenderness of the left ankle.  Tenderness over several MTP joints.  CDAI Exam: CDAI Score: 21.2  Patient Global: 6 / 100; Provider Global: 6 / 100 Swollen: 9 ; Tender: 16  Joint Exam 03/16/2023      Right  Left  Glenohumeral   Tender   Tender  Wrist   Tender  Swollen Tender  MCP 1     Swollen Tender  MCP 2  Swollen Tender  Swollen Tender  MCP 5  Swollen Tender  Swollen Tender  PIP 2     Swollen Tender  PIP 3      Tender  PIP 4     Swollen Tender  Ankle      Tender  MTP 1  Swollen Tender     MTP 2      Tender  MTP 3      Tender     Investigation: No additional findings.  Imaging: No results found.  Recent Labs: Lab Results  Component Value Date   WBC 5.0 11/28/2022   HGB 12.9 11/28/2022   PLT 264 11/28/2022   NA 138 11/28/2022   K 4.1 11/28/2022   CL 103 11/28/2022   CO2 29 11/28/2022   GLUCOSE 111 (H) 11/28/2022   BUN 19 11/28/2022   CREATININE 0.65 11/28/2022   BILITOT 0.5 11/28/2022   ALKPHOS 81 02/22/2022   AST 13  11/28/2022   ALT 26 11/28/2022   PROT 6.7 11/28/2022   ALBUMIN 4.7 02/22/2022   CALCIUM 9.4 11/28/2022   GFRAA 86 01/07/2021   QFTBGOLDPLUS NEGATIVE 09/12/2018    Speciality Comments: TB Gold: 07/25/2022 Neg  Prior therapy: methotrexate, remicade, orencia, and enbrel (inadequate response), Simponi stopped Dec 2023, Actemra started 10/17/22  Procedures:  No procedures performed Allergies: Other and Contrast media [iodinated contrast media]   Assessment / Plan:     Visit Diagnoses: Rheumatoid arthritis involving multiple sites with positive rheumatoid factor [facility]): Patient presents today experiencing joint tenderness and synovitis involving multiple joints.  She has been experiencing recurrent severe flares.  She has had difficulty making a complete fist due to the pain and inflammation in both hands.  She also has synovitis in the left wrist joint on examination today.  She had a recent flare in the left foot at which time she was having difficulty bearing full weight.  She is currently on Actemra 162 mg subcutaneous injections every 14 days.  Actemra was initiated on 10/17/2022.  She has been tolerating Actemra without any side effects or injection site reactions but has not found it to be efficacious.  Different treatment options were discussed today in detail.  Previous therapy includes methotrexate, Remicade, Orencia, Enbrel, and Simponi.  Indications, contraindications, and potential side effects of Rinvoq were discussed today in detail.  All questions were addressed and consent was obtained.  She is unable to swallow pills. So we discussed the option of liquid Rinvoq if approved by insurance.  Plan to apply for Rinvoq through her insurance.  She will be discontinuing actemra--last dose was 03/05/23. She will notify us if she cannot tolerate taking Rinvoq. She will follow  up in the office in 6-8 weeks to assess her response.   Counseled patient that Rinvoq is a JAK inhibitor indicated for  Rheumatoid Arthritis.  Counseled patient on purpose, proper use, and adverse effects of Rinvoq.    Reviewed the most common adverse effects including infection, diarrhea, headaches.  Also reviewed rare adverse effects such as bowel injury and the need to contact us if they develop stomach pain during treatment. Counseled on the increase risk of venous thrombosis. Counseled about FDA black box warning of MACE (major adverse CV events including cardiovascular death, myocardial infarction, and stroke).  Reviewed with patient that there is the possibility of an increased risk of malignancy specifically lung cancer and lymphomas but it is not well understood if this increased risk is due to the medication or the disease state. Instructed patient that medication should be held for infection and prior to surgery.  Advised patient to avoid live vaccines. Recommend annual influenza, PCV 15 or PCV20 or Pneumovax 23, and Shingrix as indicated.    Reviewed importance of routine lab monitoring including lipid panel.  Will recheck lipid panel 3 months after starting and annually thereafter. CBC and CMP will be monitored routinely every 3 months. Standing orders placed. Provided patient with medication education material and answered all questions.  Patient consented to Rinvoq.  Will upload into patient's chart.  Will apply through patient's insurance and update when we receive a response.   Prescription will be sent to pharmacy pending lab results and insurance approval.  High risk medication use -Oral Rinvoq--liquid suspension. Patient cannot swallow pills.  Oral written Volk tablet cannot be crushed, split, or chewed. She will require the oral suspension.  Actemra was initiated on 10/17/2022--last dose 03/05/23.  Previous therapy includes methotrexate, Remicade, Orencia, Enbrel, and Simponi.  CBC and CMP updated on 11/28/22. Orders for CBC and CMP released today.  Her next lab work will be due in 1 month and every 3 months.   Standing orders for CBC and CMP remain in place. TB gold negative on 07/27/22.  Lipid panel 11/28/2022: Total cholesterol 268, LDL 138, HDL 105, triglycerides 125.  Under the care of cardiology.  Plan to monitor lipid panel closely while taking Rinvoq.  Future order for lipid panel remains in place. No recent or recurrent infections.  Discussed the importance of holding Rinvoq if she develops signs or symptoms of an infection and to resume once the infection has completely cleared. History of shingles x 2.  Patient was advised to receive the Shingrix vaccine series ASAP.  - Plan: CBC with Differential/Platelet, COMPLETE METABOLIC PANEL WITH GFR  Chronic pain of both shoulders: She has ongoing pain and stiffness in both shoulder joints.  She has interrupted sleep at night due to nocturnal pain in her shoulders.  Primary osteoarthritis of both hands: Patient presents today with pain and synovitis in both hands.  She had difficulty making a complete fist today.  Right ankle swelling: No synovitis noted today.   Other fatigue: Chronic   Sicca syndrome (HCC): Unchanged.   Other medical conditions are listed as follows:   History of vitamin D deficiency: She is taking Vitamin D 5000 units daily.   History of anxiety  History of hypertension: Blood pressure was 127/75 today in the office.  Pulmonary nodule  History of shingles: X2.  Patient was encouraged to receive the Shingrix vaccine series ASAP.  History of bundle branch block  Orders: Orders Placed This Encounter  Procedures   CBC with Differential/Platelet  COMPLETE METABOLIC PANEL WITH GFR   No orders of the defined types were placed in this encounter.  Follow-Up Instructions: Return in about 8 weeks (around 05/11/2023) for Rheumatoid arthritis.   Gearldine Bienenstock, PA-C  Note - This record has been created using Dragon software.  Chart creation errors have been sought, but may not always  have been located. Such creation  errors do not reflect on  the standard of medical care.

## 2023-03-07 ENCOUNTER — Other Ambulatory Visit (HOSPITAL_COMMUNITY): Payer: Self-pay

## 2023-03-08 ENCOUNTER — Other Ambulatory Visit (HOSPITAL_COMMUNITY): Payer: Self-pay

## 2023-03-16 ENCOUNTER — Encounter: Payer: Self-pay | Admitting: Physician Assistant

## 2023-03-16 ENCOUNTER — Ambulatory Visit (INDEPENDENT_AMBULATORY_CARE_PROVIDER_SITE_OTHER): Payer: Commercial Managed Care - PPO

## 2023-03-16 ENCOUNTER — Other Ambulatory Visit (HOSPITAL_COMMUNITY): Payer: Self-pay

## 2023-03-16 ENCOUNTER — Ambulatory Visit: Payer: Commercial Managed Care - PPO | Attending: Physician Assistant | Admitting: Physician Assistant

## 2023-03-16 VITALS — BP 127/75 | HR 73 | Resp 16 | Ht 66.5 in | Wt 188.2 lb

## 2023-03-16 DIAGNOSIS — M25511 Pain in right shoulder: Secondary | ICD-10-CM | POA: Diagnosis not present

## 2023-03-16 DIAGNOSIS — M25471 Effusion, right ankle: Secondary | ICD-10-CM

## 2023-03-16 DIAGNOSIS — M19042 Primary osteoarthritis, left hand: Secondary | ICD-10-CM

## 2023-03-16 DIAGNOSIS — M810 Age-related osteoporosis without current pathological fracture: Secondary | ICD-10-CM

## 2023-03-16 DIAGNOSIS — Z79899 Other long term (current) drug therapy: Secondary | ICD-10-CM | POA: Diagnosis not present

## 2023-03-16 DIAGNOSIS — M3500 Sicca syndrome, unspecified: Secondary | ICD-10-CM

## 2023-03-16 DIAGNOSIS — Z8679 Personal history of other diseases of the circulatory system: Secondary | ICD-10-CM | POA: Diagnosis not present

## 2023-03-16 DIAGNOSIS — M25512 Pain in left shoulder: Secondary | ICD-10-CM

## 2023-03-16 DIAGNOSIS — Z8619 Personal history of other infectious and parasitic diseases: Secondary | ICD-10-CM

## 2023-03-16 DIAGNOSIS — Z8639 Personal history of other endocrine, nutritional and metabolic disease: Secondary | ICD-10-CM

## 2023-03-16 DIAGNOSIS — M0579 Rheumatoid arthritis with rheumatoid factor of multiple sites without organ or systems involvement: Secondary | ICD-10-CM

## 2023-03-16 DIAGNOSIS — M19041 Primary osteoarthritis, right hand: Secondary | ICD-10-CM | POA: Diagnosis not present

## 2023-03-16 DIAGNOSIS — R5383 Other fatigue: Secondary | ICD-10-CM | POA: Diagnosis not present

## 2023-03-16 DIAGNOSIS — R911 Solitary pulmonary nodule: Secondary | ICD-10-CM

## 2023-03-16 DIAGNOSIS — G8929 Other chronic pain: Secondary | ICD-10-CM

## 2023-03-16 DIAGNOSIS — Z8659 Personal history of other mental and behavioral disorders: Secondary | ICD-10-CM

## 2023-03-16 MED ORDER — ROMOSOZUMAB-AQQG 105 MG/1.17ML ~~LOC~~ SOSY
210.0000 mg | PREFILLED_SYRINGE | Freq: Once | SUBCUTANEOUS | Status: AC
Start: 2023-03-16 — End: 2023-03-16
  Administered 2023-03-16: 210 mg via SUBCUTANEOUS

## 2023-03-16 NOTE — Patient Instructions (Signed)
Standing Labs We placed an order today for your standing lab work.   Please have your standing labs drawn in 1 month and then every 3 months.  Please have your labs drawn 2 weeks prior to your appointment so that the provider can discuss your lab results at your appointment, if possible.  Please note that you may see your imaging and lab results in MyChart before we have reviewed them. We will contact you once all results are reviewed. Please allow our office up to 72 hours to thoroughly review all of the results before contacting the office for clarification of your results.  WALK-IN LAB HOURS  Monday through Thursday from 8:00 am -12:30 pm and 1:00 pm-5:00 pm and Friday from 8:00 am-12:00 pm.  Patients with office visits requiring labs will be seen before walk-in labs.  You may encounter longer than normal wait times. Please allow additional time. Wait times may be shorter on  Monday and Thursday afternoons.  We do not book appointments for walk-in labs. We appreciate your patience and understanding with our staff.   Labs are drawn by [facility]. Please bring your co-pay at the time of your lab draw.  You may receive a bill from [facility] for your lab work.  Please note if you are on Hydroxychloroquine and and an order has been placed for a Hydroxychloroquine level,  you will need to have it drawn 4 hours or more after your last dose.  If you wish to have your labs drawn at another location, please call the office 24 hours in advance so we can fax the orders.  The office is located at [address], [address], [place], [place] [address]   If you have any questions regarding directions or hours of operation,  please call [phone].   As a reminder, please drink plenty of water prior to coming for your lab work. Thanks!     Upadacitinib Extended-Release Tablets What is this medication? UPADACITINIB (ue PAD a SYE ti nib) treats autoimmune conditions, such as arthritis, eczema, and  ulcerative colitis. It is often used when other medications have not worked well enough or cannot be tolerated. It works by slowing down an overactive immune system. This decreases inflammation. This medicine may be used for other purposes; ask your health care provider or pharmacist if you have questions. COMMON BRAND NAME(S): RINVOQ What should I tell my care team before I take this medication? They need to know if you have any of these conditions: Blood clots Cancer Diabetes (high blood sugar) Heart disease High blood pressure High cholesterol Immune system problems Infection, especially a viral infection, such as chickenpox, cold sores, or herpes Infection, such as tuberculosis (TB), or other bacterial, fungal or viral infection Kidney disease Liver disease Low blood cell levels (white cells, red cells, and platelets) Lung or breathing disease, such as asthma or COPD Organ transplant Recent or upcoming vaccine Skin cancer/melanoma Stomach or intestine problems Stroke Tobacco use An unusual or allergic reaction to upadacitinib, other medications, foods, dyes or preservatives Pregnant or trying to get pregnant Breast-feeding How should I use this medication? Take this medication by mouth with water. Take it as directed on the prescription label at the same time every day. Do not cut, crush, or chew this medication. Swallow the tablets whole. You can take it with or without food. If it upsets your stomach, take it with food. Do not take this medication with grapefruit juice. Keep taking it unless your care team tells you to  stop. A special MedGuide will be given to you by the pharmacist with each prescription and refill. Be sure to read this information carefully each time. Talk to your care team about the use of this medication in children. While this medication may be prescribed for children as young as 12 years for selected conditions, precautions do apply. Overdosage: If you think  you have taken too much of this medicine contact a poison control center or emergency room at once. NOTE: This medicine is only for you. Do not share this medicine with others. What if I miss a dose? If you miss a dose, take it as soon as you can. If it is almost time for your next dose, take only that dose. Do not take double or extra doses. What may interact with this medication? Do not take this medication with any of the following: Baricitinib Tofacitinib This medication may also interact with the following: Azathioprine Certain antivirals for hepatitis or HIV Biologic medications, such as abatacept, adalimumab, anakinra, certolizumab, etanercept, golimumab, infliximab, rituximab, secukinumab, tocilizumab, ustekinumab Certain medications for fungal infections, such as ketoconazole, itraconazole, posaconazole, or voriconazole Certain medications for seizures, such as carbamazepine, phenobarbital, phenytoin Clarithromycin Cyclosporine Grapefruit and grapefruit juice Live virus vaccines Medications that lower your chance of fighting infection Mifepristone Nefazodone Rifampin Supplements, such as St. John's wort This list may not describe all possible interactions. Give your health care provider a list of all the medicines, herbs, non-prescription drugs, or dietary supplements you use. Also tell them if you smoke, drink alcohol, or use illegal drugs. Some items may interact with your medicine. What should I watch for while using this medication? Visit your care team for regular checks on your progress. Tell your care team if your symptoms do not start to get better or if they get worse. You may need blood work while you are taking this medication. This medication may increase your risk of getting an infection. Call your care team for advice if you get a fever, chills, sore throat, or other symptoms of a cold or flu. Do not treat yourself. Try to avoid being around people who are  sick. Your care team will screen you for tuberculosis (TB) before you start this medication. If they think you are at risk, you may be treated with medication for TB. You should start taking the medication for TB before you start this medication. Make sure to finish the full course of TB medication. Avoid taking medications that contain aspirin, acetaminophen, ibuprofen, naproxen, or ketoprofen unless instructed by your care team. These medications may hide a fever. Talk to your care team about your risk of cancer. You may be more at risk for certain types of cancer if you take this medication. This medication can make you more sensitive to the sun. Keep out of the sun. If you cannot avoid being in the sun, wear protective clothing and sunscreen. Do not use sun lamps, tanning beds, or tanning booths. Tell your care team right away if you have any change in your eyesight. Talk to your care team if you often see part of the tablet in your stool. Talk to your care team if you may be pregnant. Serious birth defects can occur if you take this medication during pregnancy and for 4 weeks after the last dose. You will need a negative pregnancy test before starting this medication. Contraception is recommended while taking this medication and for 4 weeks after the last dose. Your care team can help you find  the option that works for you. Do not breastfeed while taking this medication and for 6 days after the last dose. What side effects may I notice from receiving this medication? Side effects that you should report to your care team as soon as possible: Allergic reactions--skin rash, itching, hives, swelling of the face, lips, tongue, or throat Blood clot--pain, swelling, or warmth in the leg, shortness of breath, chest pain Change in vision Heart attack--pain or tightness in the chest, shoulders, arms, or jaw, nausea, shortness of breath, cold or clammy skin, feeling faint or lightheaded Infection--fever,  chills, cough, sore throat, wounds that don't heal, pain or trouble when passing urine, general feeling of discomfort or being unwell Liver injury--right upper belly pain, loss of appetite, nausea, light-colored stool, dark yellow or brown urine, yellowing skin or eyes, unusual weakness or fatigue Low red blood cell level--unusual weakness or fatigue, dizziness, headache, trouble breathing Sudden or severe stomach pain, bloody diarrhea, fever, nausea, vomiting Stroke--sudden numbness or weakness of the face, arm, or leg, trouble speaking, confusion, trouble walking, loss of balance or coordination, dizziness, severe headache, change in vision Side effects that usually do not require medical attention (report to your care team if they continue or are bothersome): Acne Cough Headache Nausea Runny or stuffy nose This list may not describe all possible side effects. Call your doctor for medical advice about side effects. You may report side effects to FDA at 1-800-FDA-1088. Where should I keep my medication? Keep out of the reach of children and pets. Store at room temperature between 20 and 25 degrees C (68 and 77 degrees F). Protect from moisture. Keep the container tightly closed. Keep this medication in the original container until you are ready to take it. Get rid of any unused medication after the expiration date. To get rid of medications that are no longer needed or have expired: Take the medication to a medication take-back program. Check with your pharmacy or law enforcement to find a location. If you cannot return the medication, check the label or package insert to see if the medication should be thrown out in the garbage or flushed down the toilet. If you are not sure, ask your care team. If it is safe to put it in the trash, empty the medication out of the container. Mix the medication with cat litter, dirt, coffee grounds, or other unwanted substance. Seal the mixture in a bag or  container. Put it in the trash. NOTE: This sheet is a summary. It may not cover all possible information. If you have questions about this medicine, talk to your doctor, pharmacist, or health care provider.  2024 Elsevier/Gold Standard (2022-11-27 00:00:00)

## 2023-03-16 NOTE — Progress Notes (Signed)
Pt here today for Evenity #3 of 12. Pt supplied.   Evenity 105mg /7.42mL injected into L and R arm SQ. Pt tolerated injection well.   Next in 1 month.

## 2023-03-17 ENCOUNTER — Telehealth: Payer: Self-pay | Admitting: Pharmacy Technician

## 2023-03-17 LAB — CBC WITH DIFFERENTIAL/PLATELET
Absolute Monocytes: 481 {cells}/uL (ref 200–950)
Basophils Absolute: 29 {cells}/uL (ref 0–200)
Basophils Relative: 0.5 %
Eosinophils Absolute: 139 {cells}/uL (ref 15–500)
Eosinophils Relative: 2.4 %
HCT: 40.9 % (ref 35.0–45.0)
Hemoglobin: 13.5 g/dL (ref 11.7–15.5)
Lymphs Abs: 2256 {cells}/uL (ref 850–3900)
MCH: 31.1 pg (ref 27.0–33.0)
MCHC: 33 g/dL (ref 32.0–36.0)
MCV: 94.2 fL (ref 80.0–100.0)
MPV: 10.4 fL (ref 7.5–12.5)
Monocytes Relative: 8.3 %
Neutro Abs: 2894 {cells}/uL (ref 1500–7800)
Neutrophils Relative %: 49.9 %
Platelets: 234 10*3/uL (ref 140–400)
RBC: 4.34 Million/uL (ref 3.80–5.10)
RDW: 11.8 % (ref 11.0–15.0)
Total Lymphocyte: 38.9 %
WBC: 5.8 10*3/uL (ref 3.8–10.8)

## 2023-03-17 LAB — COMPLETE METABOLIC PANEL WITH GFR
AG Ratio: 1.9 (calc) (ref 1.0–2.5)
ALT: 40 U/L — ABNORMAL HIGH (ref 6–29)
AST: 23 U/L (ref 10–35)
Albumin: 4.4 g/dL (ref 3.6–5.1)
Alkaline phosphatase (APISO): 75 U/L (ref 37–153)
BUN: 16 mg/dL (ref 7–25)
CO2: 29 mmol/L (ref 20–32)
Calcium: 9.1 mg/dL (ref 8.6–10.4)
Chloride: 102 mmol/L (ref 98–110)
Creat: 0.71 mg/dL (ref 0.50–1.05)
Globulin: 2.3 g/dL (calc) (ref 1.9–3.7)
Glucose, Bld: 89 mg/dL (ref 65–99)
Potassium: 4.1 mmol/L (ref 3.5–5.3)
Sodium: 138 mmol/L (ref 135–146)
Total Bilirubin: 0.7 mg/dL (ref 0.2–1.2)
Total Protein: 6.7 g/dL (ref 6.1–8.1)
eGFR: 97 mL/min/{1.73_m2} (ref >=60)

## 2023-03-17 NOTE — Progress Notes (Signed)
CBC WNL ALT is borderline elevated-40. Rest of CMP WNL. Avoid tylenol and alcohol use.

## 2023-03-17 NOTE — Telephone Encounter (Addendum)
Submitted a Prior Authorization request to [facility] for RINVOQ LIQUID via CoverMyMeds. Will update once we receive a response.  Key: [id number] --- Per automated response: NDC is not found or NDC is inactive. Please resolve and resubmit the request.  Printed [employer] PA form for manual submission of Rinvoq PA via fax  Chesley Mires, PharmD, MPH, BCPS, CPP Clinical Pharmacist (Rheumatology and Pulmonology)  ----- Message from Ellen Henri, CMA sent at 03/16/2023  3:57 PM EDT ----- Please apply for Rinvoq liquid suspension per Sherron Ales, PA-C. Thanks!   Consent obtained and sent to the scan center.

## 2023-03-20 ENCOUNTER — Other Ambulatory Visit (HOSPITAL_COMMUNITY): Payer: Self-pay

## 2023-03-20 ENCOUNTER — Other Ambulatory Visit: Payer: Self-pay

## 2023-03-20 MED ORDER — PREDNISONE 5 MG PO TABS
ORAL_TABLET | ORAL | 0 refills | Status: AC
  Filled 2023-03-20: qty 40, 16d supply, fill #0

## 2023-03-20 NOTE — Telephone Encounter (Signed)
Pharmacy team is working on authorization for Rinvoq SOLUTION. Rinvoq is not able to be crushed as it is extended release formulation. If Rinvoq solution is denied, Sharon Hunter 5mg  tablets may be better option as they are not extended release. They can be crushed and taken with water  Chesley Mires, PharmD, MPH, BCPS, CPP Clinical Pharmacist (Rheumatology and Pulmonology)

## 2023-03-20 NOTE — Telephone Encounter (Signed)
Please pend prescription for prednisone 20 mg tapering by 5 mg every 4 days.

## 2023-03-20 NOTE — Telephone Encounter (Signed)
Submitted a Prior Authorization request to [facility] for [facility] SOLUTION via fax. Will update once we receive a response.  Fax: [phone]  If denied, may consider Harriette Ohara 5mg  IR tablets as they can be crushed and taken with water  Chesley Mires, PharmD, MPH, BCPS, CPP Clinical Pharmacist (Rheumatology and Pulmonology)

## 2023-03-21 ENCOUNTER — Other Ambulatory Visit: Payer: Self-pay | Admitting: Pharmacist

## 2023-03-21 ENCOUNTER — Other Ambulatory Visit (HOSPITAL_COMMUNITY): Payer: Self-pay

## 2023-03-21 DIAGNOSIS — M810 Age-related osteoporosis without current pathological fracture: Secondary | ICD-10-CM

## 2023-03-21 MED ORDER — EVENITY 105 MG/1.17ML ~~LOC~~ SOSY
PREFILLED_SYRINGE | SUBCUTANEOUS | 0 refills | Status: DC
Start: 2023-03-21 — End: 2023-05-01
  Filled 2023-03-21: qty 2.4, fill #0
  Filled 2023-04-03: qty 2.34, 28d supply, fill #0

## 2023-03-21 NOTE — Telephone Encounter (Signed)
Received notification from [facility] regarding a prior authorization for RINVOQ SOLUTION. Authorization has been APPROVED from 03/20/23 to 09/18/23 ( per 30 days). Approval letter sent to scan center.  Unable to run test claim because NDC does not appear to be in system. Email has been sent to [facility] for clarification. Will await follow-up  Patient must fill through [facility]: [phone]   Authorization # [id number]  Pending copay card information to populate in Complete Pro portal.  Chesley Mires, PharmD, MPH, BCPS, CPP Clinical Pharmacist (Rheumatology and Pulmonology)

## 2023-03-22 ENCOUNTER — Other Ambulatory Visit: Payer: Self-pay

## 2023-03-22 ENCOUNTER — Other Ambulatory Visit (HOSPITAL_COMMUNITY): Payer: Self-pay

## 2023-03-22 NOTE — Telephone Encounter (Signed)
Rinvoq copay card information per Complete Pro portal: ID: [id number] Issued:03/21/2023 Rx GROUP: [id number] Rx BIN: [id number] Rx PCN: [id number]  Still awaiting response on if Rinvoq solution can be ordered  Chesley Mires, PharmD, MPH, BCPS, CPP Clinical Pharmacist (Rheumatology and Pulmonology)

## 2023-03-23 ENCOUNTER — Other Ambulatory Visit (HOSPITAL_COMMUNITY): Payer: Self-pay

## 2023-03-23 NOTE — Telephone Encounter (Signed)
Email sent to [facility] for update on Rinvoq liquid

## 2023-03-24 ENCOUNTER — Telehealth: Payer: Self-pay | Admitting: Pharmacist

## 2023-03-24 DIAGNOSIS — M0579 Rheumatoid arthritis with rheumatoid factor of multiple sites without organ or systems involvement: Secondary | ICD-10-CM

## 2023-03-24 DIAGNOSIS — Z79899 Other long term (current) drug therapy: Secondary | ICD-10-CM

## 2023-03-24 NOTE — Telephone Encounter (Signed)
Rinvoq solution appears to still not be orderable per Sandi. Will move forward with Xeljanz solution. I spoke with medical informations department at [employer] and there is no PK,PD, efficacy data for Xeljanz crushed tablets. Therefore, due to lack of efficacy data, will try for Sharon Hunter solution first  Dose: 5mL twice daily  Chesley Mires, PharmD, MPH, BCPS, CPP Clinical Pharmacist (Rheumatology and Pulmonology)

## 2023-03-24 NOTE — Telephone Encounter (Addendum)
Rinvoq solution appears to still not be orderable per Sandi. Will move forward with Xeljanz solution. I spoke with medical informations department at [employer] and there is no PK,PD, efficacy data for Xeljanz crushed tablets. Therefore, due to lack of efficacy data, will try for Xeljanz solution first   Dose: 5mL twice daily   Submitted an URGENT Prior Authorization request to [facility] for [facility] solution via CoverMyMeds. Will update once we receive a response.  Key: [id number]  Chesley Mires, PharmD, MPH, BCPS, CPP Clinical Pharmacist (Rheumatology and Pulmonology)

## 2023-03-27 ENCOUNTER — Other Ambulatory Visit: Payer: Self-pay

## 2023-03-27 ENCOUNTER — Other Ambulatory Visit (HOSPITAL_COMMUNITY): Payer: Self-pay

## 2023-03-27 MED ORDER — XELJANZ 1 MG/ML PO SOLN
5.0000 mL | Freq: Two times a day (BID) | ORAL | 2 refills | Status: DC
Start: 2023-03-27 — End: 2023-05-30
  Filled 2023-03-27: qty 240, fill #0
  Filled 2023-03-30: qty 240, 24d supply, fill #0
  Filled 2023-04-13: qty 240, 24d supply, fill #1
  Filled 2023-05-12: qty 240, 24d supply, fill #2

## 2023-03-27 NOTE — Telephone Encounter (Signed)
Started the process of signing patient up for [place] copay card. Spoke with patient who said that she will complete the  rest of the registration process & will give Mills Koller the copay card information.  Darolyn Rua, PharmD Student [employer]

## 2023-03-27 NOTE — Telephone Encounter (Signed)
Received notification from [facility] regarding a prior authorization for [facility]. Authorization has been APPROVED from 03/24/23 to 09/20/23. Approval letter sent to scan center.  Per test claim, copay for 24 days supply is [phone] (must be dispensed in package size which is )  Patient must fill through [facility]: [phone]   Authorization # [id number]  Rx sent to [facility]. Routing to [place] for onboarding needs  Chesley Mires, PharmD, MPH, BCPS, CPP Clinical Pharmacist (Rheumatology and Pulmonology)

## 2023-03-29 ENCOUNTER — Other Ambulatory Visit (HOSPITAL_COMMUNITY): Payer: Self-pay

## 2023-03-29 NOTE — Telephone Encounter (Signed)
Reached out to pt--she has not yet completed enrollment into the program due to a glitch that does not allow her to properly mark the financial documentation as "reviewed" and has been advised by the company to try it again tonight on her personal PC rather than a work Animator. Pt states she will do so and then reach out to me tomorrow with any information that she is able to obtain. She has been provided with my direct office phone number.  I have also sent an email to the purchasing agent over at [facility] in the hopes of getting the medication ordered for tomorrow and couriered to [facility] for pickup by Friday as pt is going out of town on Saturday.

## 2023-03-30 ENCOUNTER — Other Ambulatory Visit (HOSPITAL_COMMUNITY): Payer: Self-pay

## 2023-03-30 ENCOUNTER — Other Ambulatory Visit: Payer: Self-pay

## 2023-03-30 NOTE — Telephone Encounter (Signed)
Received confirmation that medication will be ordered to [facility] for tomorrow and that pt will be able to go pick it up. Contacted pt and provided update, advised her that it would be best to go sometime after noon, test claim revealed that she would have no copay for the medication. Pt verbalized understanding to all.

## 2023-03-30 NOTE — Telephone Encounter (Signed)
Pt reached out and provided copay card info (see below) which has been added to [facility]. Unfortunately the purchasing agent has not responded to my email, she appears to be out of the office since Tuesday according to her activity status on Teams. I have discussed queuing the medication to be ordered and filled by [facility] with [facility], who is working on determining if anyone is covering for [facility] or would otherwise need to be notified of this plan.   Will provide update once one is received.

## 2023-03-31 ENCOUNTER — Other Ambulatory Visit (HOSPITAL_COMMUNITY): Payer: Self-pay

## 2023-04-03 ENCOUNTER — Other Ambulatory Visit (HOSPITAL_COMMUNITY): Payer: Self-pay

## 2023-04-04 ENCOUNTER — Other Ambulatory Visit (HOSPITAL_COMMUNITY): Payer: Self-pay

## 2023-04-07 ENCOUNTER — Other Ambulatory Visit (HOSPITAL_COMMUNITY): Payer: Self-pay

## 2023-04-07 ENCOUNTER — Other Ambulatory Visit: Payer: Self-pay

## 2023-04-09 ENCOUNTER — Other Ambulatory Visit: Payer: Self-pay | Admitting: Medical

## 2023-04-10 ENCOUNTER — Other Ambulatory Visit: Payer: Self-pay

## 2023-04-10 ENCOUNTER — Encounter: Payer: Self-pay | Admitting: *Deleted

## 2023-04-10 ENCOUNTER — Other Ambulatory Visit (HOSPITAL_COMMUNITY): Payer: Self-pay

## 2023-04-10 MED ORDER — SERTRALINE HCL 50 MG PO TABS
50.0000 mg | ORAL_TABLET | Freq: Every day | ORAL | 0 refills | Status: DC
  Filled 2023-04-10 (×2): qty 90, 90d supply, fill #0

## 2023-04-12 ENCOUNTER — Other Ambulatory Visit (HOSPITAL_COMMUNITY): Payer: Self-pay

## 2023-04-13 ENCOUNTER — Ambulatory Visit (INDEPENDENT_AMBULATORY_CARE_PROVIDER_SITE_OTHER): Payer: Commercial Managed Care - PPO

## 2023-04-13 ENCOUNTER — Other Ambulatory Visit (HOSPITAL_COMMUNITY): Payer: Self-pay

## 2023-04-13 DIAGNOSIS — M810 Age-related osteoporosis without current pathological fracture: Secondary | ICD-10-CM

## 2023-04-13 MED ORDER — ROMOSOZUMAB-AQQG 105 MG/1.17ML ~~LOC~~ SOSY
210.0000 mg | PREFILLED_SYRINGE | Freq: Once | SUBCUTANEOUS | Status: AC
Start: 2023-04-13 — End: 2023-04-13
  Administered 2023-04-13: 210 mg via SUBCUTANEOUS

## 2023-04-13 NOTE — Progress Notes (Signed)
         Pt here today for Evenity #4of 12. Pt supplied.    Evenity 105mg /7.47mL injected into L and R arm SQ. Pt tolerated injection well.    Next in 1 month

## 2023-04-19 ENCOUNTER — Other Ambulatory Visit (HOSPITAL_COMMUNITY): Payer: Self-pay

## 2023-04-20 ENCOUNTER — Other Ambulatory Visit (HOSPITAL_COMMUNITY): Payer: Self-pay

## 2023-04-21 ENCOUNTER — Other Ambulatory Visit: Payer: Self-pay

## 2023-04-26 ENCOUNTER — Other Ambulatory Visit (HOSPITAL_COMMUNITY): Payer: Self-pay

## 2023-04-27 ENCOUNTER — Other Ambulatory Visit: Payer: Self-pay | Admitting: Family Medicine

## 2023-04-27 ENCOUNTER — Other Ambulatory Visit (HOSPITAL_COMMUNITY): Payer: Self-pay

## 2023-04-27 DIAGNOSIS — M810 Age-related osteoporosis without current pathological fracture: Secondary | ICD-10-CM

## 2023-04-27 NOTE — Progress Notes (Unsigned)
Office Visit Note  Patient: Sharon Hunter             Date of Birth: 05-25-1961           MRN: [id number]             PCP: Donato Schultz, DO Referring: Donato Schultz, * Visit Date: 05/11/2023 Occupation: @GUAROCC @  Subjective:  Recurrent flares   History of Present Illness: Sharon Hunter is a 62 y.o. female with history of seropositive rheumatoid arthritis and osteoarthritis.  Patient is currently on Xeljanz 5 mL BID, which started 5-6 weeks ago.  Overall she has been tolerating Xeljanz without any side effects.  She denies any recent or recurrent infections since initiating therapy.  Overall she has noticed a 75% improvement in her symptoms but has had a total of 4 flare since initiating therapy.  The first 3 flares were minor involving her right hand and the most recent flare was involving the left shoulder which was severe.  The flare started on Friday and was debilitating.  She had severe nocturnal pain to the point that she could not control her pain level with over-the-counter products or 20 mg of prednisone.  She states that her symptoms have subsided but she continues to have some residual discomfort in both shoulders.  Patient is willing to give Harriette Ohara more time but is concerned about Shirlee More as well as the severity of her recent flare. Patient is currently receiving Evenity injections once monthly prescribed by her PCP.    Activities of Daily Living:  Patient reports morning stiffness for 6 hours.   Patient Reports nocturnal pain.  Difficulty dressing/grooming: Reports Difficulty climbing stairs: Denies Difficulty getting out of chair: Denies Difficulty using hands for taps, buttons, cutlery, and/or writing: Reports  Review of Systems  Constitutional:  Positive for fatigue.  HENT:  Positive for mouth dryness and nose dryness. Negative for mouth sores.   Eyes:  Negative for dryness.  Respiratory:  Negative for shortness of breath.   Cardiovascular:  Negative  for chest pain and palpitations.  Gastrointestinal:  Negative for blood in stool, constipation and diarrhea.  Endocrine: Negative for increased urination.  Genitourinary:  Negative for involuntary urination.  Musculoskeletal:  Positive for joint pain, joint pain, joint swelling, muscle weakness and morning stiffness. Negative for gait problem, myalgias, muscle tenderness and myalgias.  Skin:  Negative for color change, rash, hair loss and sensitivity to sunlight.  Allergic/Immunologic: Negative for susceptible to infections.  Neurological:  Negative for dizziness and headaches.  Hematological:  Negative for swollen glands.  Psychiatric/Behavioral:  Negative for depressed mood and sleep disturbance. The patient is not nervous/anxious.     PMFS History:  Patient Active Problem List   Diagnosis Date Noted   Mood disorder (HCC)- Emotional Eating 08/02/2022   Class 1 obesity with serious comorbidity and body mass index (BMI) of 30.0 to 30.9 in adult 08/02/2022   At risk for impaired metabolic function 05/30/2022   Perioral dermatitis 05/26/2022   Preventative health care 05/26/2022   Essential hypertension 05/26/2022   Abnormal EKG 05/26/2022   Prediabetes 03/09/2022   Vitamin B12 deficiency 03/09/2022   Primary hypertension 02/22/2022   Hyperlipidemia 02/22/2022   GAD (generalized anxiety disorder) 02/22/2022   Vitamin D deficiency 02/22/2022   Ear pain, left 08/13/2021   History of anxiety 03/27/2017   History of hypertension 03/27/2017   History of bundle branch block 03/27/2017   Primary osteoarthritis of both hands 12/22/2016  History of shingles 08/02/2016   High risk medication use 08/02/2016   Hypertension 08/01/2016   IBS (irritable bowel syndrome) 08/01/2016   Anxiety 08/01/2016   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   LBBB (left bundle branch block) 01/30/2015   Palpitations 01/30/2015   Pulmonary nodule 07/04/2011   Murmur 02/03/2011   Rheumatoid arthritis  (HCC) 02/03/2011   Hair loss 02/03/2011   Fatigue 02/03/2011    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   Hypertension    IBS (irritable bowel syndrome) 08/01/2016   Joint pain    Lactose intolerance    Migraine    Murmur 02/03/2011   Obese    Ovarian failure    Palpitation    Pulmonary nodule, left 2009   last CT Nov 2011-- due 08/2011   Rheumatoid arthritis(714.0)    in remission , off meds as of Summer 2012   Vitamin D deficiency     Family History  Problem Relation Age of Onset   Arthritis Mother    Uterine cancer Mother    Hypertension Mother    Diabetes Mother    Alcohol abuse Father    Hyperlipidemia Father    Heart disease Father    High blood pressure Father    Sleep apnea Father    Heart disease Sister        cardiac stent   Diabetes Sister    Hyperlipidemia Sister    Hypertension Sister    Alcohol abuse Brother    Diabetes Brother    Hypertension Brother    Cancer Brother        lung   Cancer Maternal Aunt        lung----non smoker   Past Surgical History:  Procedure Laterality Date   CHOLECYSTECTOMY  1999   COLONOSCOPY  10/31/11   EXPLORATORY LAPAROTOMY     TONSILLECTOMY  1969   WISDOM TOOTH EXTRACTION     Social History   Social History Narrative   Exercise-- jogs   Immunization History  Administered Date(s) Administered   Influenza Split 07/03/2013   Influenza,inj,Quad PF,6-35 Mos 07/17/2020   Influenza-Unspecified 07/03/2014, 07/04/2015   PFIZER(Purple Top)SARS-COV-2 Vaccination 01/13/2020, 05/15/2020   Tdap 12/01/2009     Objective: Vital Signs: BP 125/71 (BP Location: Left Arm, Patient Position: Sitting, Cuff Size: Normal)   Pulse (!) 57   Resp 16   Ht 5' 6.5" (1.689 m)   Wt 194 lb (88 kg)   LMP 10/03/2005   BMI 30.84 kg/m    Physical Exam Vitals and nursing note reviewed.  Constitutional:      Appearance: She is  well-developed.  HENT:     Head: Normocephalic and atraumatic.  Eyes:     Conjunctiva/sclera: Conjunctivae normal.  Cardiovascular:     Rate and Rhythm: Normal rate and regular rhythm.     Heart sounds: Normal heart sounds.  Pulmonary:     Effort: Pulmonary effort is normal.     Breath sounds: Normal breath sounds.  Abdominal:     General: Bowel sounds are normal.     Palpations: Abdomen is soft.  Musculoskeletal:     Cervical back: Normal range of motion.  Lymphadenopathy:     Cervical: No cervical adenopathy.  Skin:    General: Skin is warm and dry.     Capillary Refill: Capillary refill takes less than 2 seconds.  Neurological:     Mental Status: She is alert and oriented to person, place, and time.  Psychiatric:        Behavior: Behavior normal.      Musculoskeletal Exam: C-spine has good ROM.  Painful ROM of both shoulders with limited internal rotation.  Tenderness of both shoulders.  Elbow joints and wrist joints have good ROM.  Tenderness over the right wrist.  Tenderness of the right 3rd MCP joint.  CMC joint prominence.  Hip joints have good ROM with right groin pain.  Knee joints have good ROM with no warmth or effusion.  Ankle joints have good ROM with no tenderness or swelling.   CDAI Exam: CDAI Score: -- Patient Global: 30 / 100; Provider Global: 60 / 100 Swollen: --; Tender: -- Joint Exam 05/11/2023   No joint exam has been documented for this visit   There is currently no information documented on the homunculus. Go to the Rheumatology activity and complete the homunculus joint exam.  Investigation: No additional findings.  Imaging: MM 3D SCREENING MAMMOGRAM BILATERAL BREAST  Result Date: 05/09/2023 CLINICAL DATA:  Screening. EXAM: DIGITAL SCREENING BILATERAL MAMMOGRAM WITH TOMOSYNTHESIS AND CAD TECHNIQUE: Bilateral screening digital craniocaudal and mediolateral oblique mammograms were obtained. Bilateral screening digital breast tomosynthesis was  performed. The images were evaluated with computer-aided detection. COMPARISON:  Previous exam(s). ACR Breast Density Category b: There are scattered areas of fibroglandular density. FINDINGS: There are no findings suspicious for malignancy. IMPRESSION: No mammographic evidence of malignancy. A result letter of this screening mammogram will be mailed directly to the patient. RECOMMENDATION: Screening mammogram in one year. (Code:SM-B-01Y) BI-RADS CATEGORY  1: Negative. Electronically Signed   By: Elberta Fortis M.D.   On: 05/09/2023 13:34    Recent Labs: Lab Results  Component Value Date   WBC 5.8 03/16/2023   HGB 13.5 03/16/2023   PLT 234 03/16/2023   NA 138 03/16/2023   K 4.1 03/16/2023   CL 102 03/16/2023   CO2 29 03/16/2023   GLUCOSE 89 03/16/2023   BUN 16 03/16/2023   CREATININE 0.71 03/16/2023   BILITOT 0.7 03/16/2023   ALKPHOS 81 02/22/2022   AST 23 03/16/2023   ALT 40 (H) 03/16/2023   PROT 6.7 03/16/2023   ALBUMIN 4.7 02/22/2022   CALCIUM 9.1 03/16/2023   GFRAA 86 01/07/2021   QFTBGOLDPLUS NEGATIVE 09/12/2018    Speciality Comments: TB Gold: 07/25/2022 Neg  Prior therapy: methotrexate, remicade, orencia, and enbrel (inadequate response), Simponi stopped Dec 2023, Actemra started 10/17/22-June 2024; Harriette Ohara liq started 03/27/23  Procedures:  No procedures performed Allergies: Other and Contrast media [iodinated contrast media]   Assessment / Plan:     Visit Diagnoses: Rheumatoid arthritis involving multiple sites with positive rheumatoid factor (HCC): Patient is currently on Xeljanz 5 mL by mouth twice daily which was started at the end of June 2024.  She has been tolerating Harriette Ohara and has not missed any doses thus far.  She has not had any recent or recurrent infections.  Overall she has noticed about a 75% improvement in her joint pain and inflammation but she has had 3 minor flares involving her right hand and a severe flare involving the left shoulder since initiating  therapy.  The most recent flare involving her left shoulder is debilitating to the point that she has severe nocturnal pain as well as difficulty using her left arm.  She took prednisone 20 mg along with over-the-counter products for symptomatic relief.  She continues have some residual discomfort  in both shoulders as well as stiffness with internal rotation. Patient is willing to give Harriette Ohara more time and for [place] to reassess for the full efficacy at her follow-up visit.  Discussed adding on sulfasalazine as combination therapy.  A prescription for sulfasalazine 500 mg 2 tablets by mouth twice daily will be sent to the pharmacy pending G6PD results.  She will follow-up in the office in 2 months or sooner if needed to assess her response.  High risk medication use - Xeljanz 5 ml by mouth twice daily.  Plan to add sulfasalazine 500 mg 2 tablets by mouth twice daily pending G6PD results. Previous therapy: Plaquenil, methotrexate, Remicade, Orencia, Enbrel, Simponi, and Actemra.  Medication counseling:  Baseline Immunosuppressant Labs TB GOLD    Latest Ref Rng & Units 09/12/2018    2:17 PM  Quantiferon TB Gold  Quantiferon TB Gold Plus NEGATIVE NEGATIVE    SPEP    Latest Ref Rng & Units 03/16/2023    1:20 PM  Serum Protein Electrophoresis  Total Protein 6.1 - 8.1 g/dL 6.7    W0JW: pending   Chest x-ray: 10/29/19   Does the patient have an allergy to sulfa drugs? No  Patient was counseled on the purpose, proper use, and adverse effects of sulfasalazine including risk of infection and chance of nausea, headache, and sun sensitivity.  Also discussed risk of skin rash and advised patient to stop the medication and let us know if she develops a rash. Also discussed for the potential of discoloration of the urine, sweat, or tears.  Advised patient to avoid live vaccines.  Recommend annual influenza, Pneumovax 23, Prevnar 13, and Shingrix as indicated.   Reviewed the importance of frequent labs to  monitor liver, kidneys, and blood counts.  Standing orders placed and patient to return 1 month after starting therapy and then every 3 months.  Provided patient with educational materials on sulfasalazine and answered all questions.  Patient consented to sulfasalazine use, and consent will be uploaded into the media tab.    Patient dose will be 500 mg 2 tablets twice daily.  Prescription will be sent to pharmacy pending lab results and insurance approval.  CBC and CMP updated on 03/16/23. Orders for CBC and CMP released today.  She will require updated lab work in 1 month and every 3 months. TB gold negative on 07/27/22.  Patient plans on having updated TB Gold with health at work. G6PD ordered today.  Discussed the importance of holding xeljanz and sulfasalazine if she develops signs or symptoms of an infection and to resume once the infection has completely cleared.  - Plan: CBC with Differential/Platelet, COMPLETE METABOLIC PANEL WITH GFR, Glucose 6 phosphate dehydrogenase  Chronic pain of both shoulders: Patient has painful range of motion in both shoulders especially with internal rotation.  Last Friday she had a flare involving the left shoulder which was severe and debilitating.  She has severe nocturnal pain to the point that she had to take 2 showers during the night as well as took several over-the-counter products for pain relief as well as prednisone 20 mg.  She continues to have some residual discomfort in both shoulder joints.  She is willing to continue on Xeljanz as prescribed.  Plan to add on sulfasalazine as discussed above.  Primary osteoarthritis of both hands: CMC, PIP, DIP thickening consistent with osteoarthritis of both hands.  Patient recently had recurrent minor flares involving the right hand specifically her right third MCP joint.  On examination today  she has some thickening and tenderness over the right third MCP joint but no active synovitis was noted.  Right ankle  swelling: No tenderness or synovitis noted on examination today.  Other fatigue: Chronic, stable.  Sicca syndrome (HCC): Chronic nose and mouth dryness.  Other medical conditions are listed as follows:   History of vitamin D deficiency  History of anxiety  History of hypertension  Pulmonary nodule  History of shingles  History of bundle branch block  History of hyperlipidemia -Patient requested lipid panel to be obtained today.  Results will be forwarded to her cardiologist.  plan: Lipid panel  Orders: Orders Placed This Encounter  Procedures   CBC with Differential/Platelet   COMPLETE METABOLIC PANEL WITH GFR   Lipid panel   Glucose 6 phosphate dehydrogenase   No orders of the defined types were placed in this encounter.    Follow-Up Instructions: Return in about 2 months (around 07/11/2023) for Rheumatoid arthritis, Osteoarthritis.   Gearldine Bienenstock, PA-C  Note - This record has been created using Dragon software.  Chart creation errors have been sought, but may not always  have been located. Such creation errors do not reflect on  the standard of medical care.

## 2023-05-01 ENCOUNTER — Other Ambulatory Visit: Payer: Self-pay

## 2023-05-01 ENCOUNTER — Other Ambulatory Visit: Payer: Self-pay | Admitting: Pharmacist

## 2023-05-01 ENCOUNTER — Other Ambulatory Visit (HOSPITAL_COMMUNITY): Payer: Self-pay

## 2023-05-01 DIAGNOSIS — M810 Age-related osteoporosis without current pathological fracture: Secondary | ICD-10-CM

## 2023-05-01 MED ORDER — EVENITY 105 MG/1.17ML ~~LOC~~ SOSY
PREFILLED_SYRINGE | SUBCUTANEOUS | 0 refills | Status: DC
Start: 2023-05-01 — End: 2023-06-08
  Filled 2023-05-01: qty 2.34, 28d supply, fill #0

## 2023-05-01 MED ORDER — EVENITY 105 MG/1.17ML ~~LOC~~ SOSY
PREFILLED_SYRINGE | SUBCUTANEOUS | 0 refills | Status: DC
Start: 2023-05-01 — End: 2023-05-01
  Filled 2023-05-01: qty 2.34, fill #0

## 2023-05-02 ENCOUNTER — Telehealth (HOSPITAL_BASED_OUTPATIENT_CLINIC_OR_DEPARTMENT_OTHER): Payer: Self-pay

## 2023-05-02 ENCOUNTER — Other Ambulatory Visit (HOSPITAL_COMMUNITY): Payer: Self-pay

## 2023-05-02 ENCOUNTER — Other Ambulatory Visit: Payer: Self-pay

## 2023-05-03 ENCOUNTER — Other Ambulatory Visit (HOSPITAL_BASED_OUTPATIENT_CLINIC_OR_DEPARTMENT_OTHER): Payer: Self-pay | Admitting: Family Medicine

## 2023-05-03 DIAGNOSIS — Z1231 Encounter for screening mammogram for malignant neoplasm of breast: Secondary | ICD-10-CM

## 2023-05-04 ENCOUNTER — Other Ambulatory Visit: Payer: Self-pay

## 2023-05-04 ENCOUNTER — Other Ambulatory Visit (HOSPITAL_COMMUNITY): Payer: Self-pay

## 2023-05-05 ENCOUNTER — Other Ambulatory Visit (HOSPITAL_COMMUNITY): Payer: Self-pay

## 2023-05-08 ENCOUNTER — Encounter (HOSPITAL_BASED_OUTPATIENT_CLINIC_OR_DEPARTMENT_OTHER): Payer: Self-pay

## 2023-05-08 ENCOUNTER — Encounter: Payer: Self-pay | Admitting: Family Medicine

## 2023-05-08 ENCOUNTER — Ambulatory Visit (HOSPITAL_BASED_OUTPATIENT_CLINIC_OR_DEPARTMENT_OTHER)
Admission: RE | Admit: 2023-05-08 | Discharge: 2023-05-08 | Disposition: A | Discharge status: Home / Self Care | Payer: Commercial Managed Care - PPO | Source: Ambulatory Visit | Attending: Family Medicine | Admitting: Family Medicine

## 2023-05-08 DIAGNOSIS — Z1231 Encounter for screening mammogram for malignant neoplasm of breast: Secondary | ICD-10-CM | POA: Insufficient documentation

## 2023-05-11 ENCOUNTER — Encounter: Payer: Self-pay | Admitting: Physician Assistant

## 2023-05-11 ENCOUNTER — Ambulatory Visit: Payer: Commercial Managed Care - PPO

## 2023-05-11 ENCOUNTER — Ambulatory Visit (INDEPENDENT_AMBULATORY_CARE_PROVIDER_SITE_OTHER): Payer: Commercial Managed Care - PPO

## 2023-05-11 ENCOUNTER — Ambulatory Visit: Payer: Commercial Managed Care - PPO | Attending: Physician Assistant | Admitting: Physician Assistant

## 2023-05-11 ENCOUNTER — Telehealth: Payer: Self-pay

## 2023-05-11 VITALS — BP 125/71 | HR 57 | Resp 16 | Ht 66.5 in | Wt 194.0 lb

## 2023-05-11 DIAGNOSIS — Z8639 Personal history of other endocrine, nutritional and metabolic disease: Secondary | ICD-10-CM | POA: Diagnosis not present

## 2023-05-11 DIAGNOSIS — Z79899 Other long term (current) drug therapy: Secondary | ICD-10-CM

## 2023-05-11 DIAGNOSIS — M3500 Sicca syndrome, unspecified: Secondary | ICD-10-CM | POA: Diagnosis not present

## 2023-05-11 DIAGNOSIS — Z8659 Personal history of other mental and behavioral disorders: Secondary | ICD-10-CM

## 2023-05-11 DIAGNOSIS — M25471 Effusion, right ankle: Secondary | ICD-10-CM

## 2023-05-11 DIAGNOSIS — M810 Age-related osteoporosis without current pathological fracture: Secondary | ICD-10-CM | POA: Diagnosis not present

## 2023-05-11 DIAGNOSIS — Z8679 Personal history of other diseases of the circulatory system: Secondary | ICD-10-CM | POA: Diagnosis not present

## 2023-05-11 DIAGNOSIS — M19041 Primary osteoarthritis, right hand: Secondary | ICD-10-CM

## 2023-05-11 DIAGNOSIS — G8929 Other chronic pain: Secondary | ICD-10-CM

## 2023-05-11 DIAGNOSIS — Z8619 Personal history of other infectious and parasitic diseases: Secondary | ICD-10-CM

## 2023-05-11 DIAGNOSIS — M25512 Pain in left shoulder: Secondary | ICD-10-CM

## 2023-05-11 DIAGNOSIS — M19042 Primary osteoarthritis, left hand: Secondary | ICD-10-CM

## 2023-05-11 DIAGNOSIS — M0579 Rheumatoid arthritis with rheumatoid factor of multiple sites without organ or systems involvement: Secondary | ICD-10-CM | POA: Diagnosis not present

## 2023-05-11 DIAGNOSIS — R5383 Other fatigue: Secondary | ICD-10-CM | POA: Diagnosis not present

## 2023-05-11 DIAGNOSIS — R911 Solitary pulmonary nodule: Secondary | ICD-10-CM

## 2023-05-11 DIAGNOSIS — M25511 Pain in right shoulder: Secondary | ICD-10-CM

## 2023-05-11 LAB — CBC WITH DIFFERENTIAL/PLATELET
Absolute Monocytes: 521 cells/uL (ref 200–950)
Basophils Absolute: 40 cells/uL (ref 0–200)
Basophils Relative: 0.6 %
Eosinophils Absolute: 119 cells/uL (ref 15–500)
Eosinophils Relative: 1.8 %
HCT: 36.6 % (ref 35.0–45.0)
Hemoglobin: 12.2 g/dL (ref 11.7–15.5)
Lymphs Abs: 1815 cells/uL (ref 850–3900)
MCH: 30.9 pg (ref 27.0–33.0)
MCHC: 33.3 g/dL (ref 32.0–36.0)
MCV: 92.7 fL (ref 80.0–100.0)
MPV: 10.3 fL (ref 7.5–12.5)
Monocytes Relative: 7.9 %
Neutro Abs: 4105 cells/uL (ref 1500–7800)
Neutrophils Relative %: 62.2 %
Platelets: 323 10*3/uL (ref 140–400)
RBC: 3.95 10*6/uL (ref 3.80–5.10)
RDW: 12 % (ref 11.0–15.0)
Total Lymphocyte: 27.5 %
WBC: 6.6 10*3/uL (ref 3.8–10.8)

## 2023-05-11 MED ORDER — ROMOSOZUMAB-AQQG 105 MG/1.17ML ~~LOC~~ SOSY
210.0000 mg | PREFILLED_SYRINGE | Freq: Once | SUBCUTANEOUS | Status: AC
  Administered 2023-05-11: 210 mg via SUBCUTANEOUS

## 2023-05-11 NOTE — Patient Instructions (Addendum)
Standing Labs We placed an order today for your standing lab work.   Please have your standing labs drawn in 1 month and every 3 months   Please have your labs drawn 2 weeks prior to your appointment so that the provider can discuss your lab results at your appointment, if possible.  Please note that you may see your imaging and lab results in MyChart before we have reviewed them. We will contact you once all results are reviewed. Please allow our office up to 72 hours to thoroughly review all of the results before contacting the office for clarification of your results.  WALK-IN LAB HOURS  Monday through Thursday from 8:00 am -12:30 pm and 1:00 pm-5:00 pm and Friday from 8:00 am-12:00 pm.  Patients with office visits requiring labs will be seen before walk-in labs.  You may encounter longer than normal wait times. Please allow additional time. Wait times may be shorter on  Monday and Thursday afternoons.  We do not book appointments for walk-in labs. We appreciate your patience and understanding with our staff.   Labs are drawn by [facility]. Please bring your co-pay at the time of your lab draw.  You may receive a bill from [facility] for your lab work.  Please note if you are on Hydroxychloroquine and and an order has been placed for a Hydroxychloroquine level,  you will need to have it drawn 4 hours or more after your last dose.  If you wish to have your labs drawn at another location, please call the office 24 hours in advance so we can fax the orders.  The office is located at [address], [place], [place] [address]   If you have any questions regarding directions or hours of operation,  please call [phone].   As a reminder, please drink plenty of water prior to coming for your lab work. Thanks!  Sulfasalazine Tablets What is this medication? SULFASALAZINE (sul fa SAL a zeen) treats ulcerative colitis. It works by decreasing inflammation. It belongs to a group of  medications called salicylates. This medicine may be used for other purposes; ask your health care provider or pharmacist if you have questions. COMMON BRAND NAME(S): Azulfidine, Sulfazine What should I tell my care team before I take this medication? They need to know if you have any of these conditions: Asthma Blood disorders or anemia Glucose-6-phosphate dehydrogenase (G6PD) deficiency Intestinal obstruction Kidney disease Liver disease Porphyria Urinary tract obstruction An unusual reaction to sulfasalazine, sulfa medications, salicylates, or other medications, foods, dyes, or preservatives Pregnant or trying to get pregnant Breast-feeding How should I use this medication? Take this medication by mouth with a full glass of water. Take it as directed on the prescription label at the same time every day. You can take it with or without food. If it upsets your stomach, take it with food. Keep taking it unless your care team tells you to stop. Talk to your care team about the use of this medication in children. While this medication may be prescribed for children as young as 6 years for selected conditions, precautions do apply. Patients over 75 years old may have a stronger reaction and need a smaller dose. Overdosage: If you think you have taken too much of this medicine contact a poison control center or emergency room at once. NOTE: This medicine is only for you. Do not share this medicine with others. What if I miss a dose? If you miss a dose, take it as soon  as you can. If it is almost time for your next dose, take only that dose. Do not take double or extra doses. What may interact with this medication? Digoxin Folic acid This list may not describe all possible interactions. Give your health care provider a list of all the medicines, herbs, non-prescription drugs, or dietary supplements you use. Also tell them if you smoke, drink alcohol, or use illegal drugs. Some items may  interact with your medicine. What should I watch for while using this medication? Visit your care team for regular checks on your progress. Tell your care team if your symptoms do not start to get better or if they get worse. You will need frequent blood and urine checks. This medication can make you more sensitive to the sun. Keep out of the sun. If you cannot avoid being in the sun, wear protective clothing and use sunscreen. Do not use sun lamps or tanning beds/booths. Drink plenty of water while taking this medication. What side effects may I notice from receiving this medication? Side effects that you should report to your care team as soon as possible: Allergic reactions--skin rash, itching, hives, swelling of the face, lips, tongue, or throat Aplastic anemia--unusual weakness or fatigue, dizziness, headache, trouble breathing, increased bleeding or bruising Dry cough, shortness of breath or trouble breathing Heart muscle inflammation--unusual weakness or fatigue, shortness of breath, chest pain, fast or irregular heartbeat, dizziness, swelling of the ankles, feet, or hands Infection--fever, chills, cough, sore throat, wounds that don't heal, pain or trouble when passing urine, general feeling of discomfort or being unwell Kidney injury--decrease in the amount of urine, swelling of the ankles, hands, or feet Liver injury--right upper belly pain, loss of appetite, nausea, light-colored stool, dark yellow or brown urine, yellowing skin or eyes, unusual weakness or fatigue Rash, fever, and swollen lymph nodes Redness, blistering, peeling, or loosening of the skin, including inside the mouth Side effects that usually do not require medical attention (report to your care team if they continue or are bothersome): Dark yellow or orange saliva, sweat, or urine Dizziness Headache Loss of appetite Nausea Upset stomach Vomiting This list may not describe all possible side effects. Call your  doctor for medical advice about side effects. You may report side effects to FDA at 1-800-FDA-1088. Where should I keep my medication? Keep out of the reach of children and pets. Store at room temperature between 15 and 30 degrees C (59 and 86 degrees F). Get rid of any unused medication after the expiration date. To get rid of medications that are no longer needed or have expired: Take the medications to a medication take-back program. Check with your pharmacy or law enforcement to find a location. If you cannot return the medication, check the label or package insert to see if the medication should be thrown out in the garbage or flushed down the toilet. If you are not sure, ask your care team. If it is safe to put it in the trash, take the medication out of the container. Mix the medication with cat litter, dirt, coffee grounds, or other unwanted substance. Seal the mixture in a bag or container. Put it in the trash. NOTE: This sheet is a summary. It may not cover all possible information. If you have questions about this medicine, talk to your doctor, pharmacist, or health care provider.  2024 Elsevier/Gold Standard (2021-08-12 00:00:00)

## 2023-05-11 NOTE — Progress Notes (Signed)
Pt here today for 5th out of 12 Evenity injection. Injections were given SubQ in both arms, pt tolerated well. Next injection scheduled for 06/14/2023 Pt supplied.

## 2023-05-11 NOTE — Telephone Encounter (Signed)
Pt came in today for 5 out of 12 Evenity injection. Pt tolerated well.  Pt is scheduled for next injection on 06/14/2023.

## 2023-05-11 NOTE — Progress Notes (Signed)
Pharmacy Note  Subjective:  Patient presents today to [facility] for follow up office visit.  Patient seen by pharmacist for counseling on sulfasalazine for rheumatoid arthritis.  She is currently on Xeljanz 5mg  liquid p.o. twice daily. She is unable to swallow tablets whole.  She has tried and failed: MTX (elevated LFTs), Remicade IV, Orencia SQ, Enbrel, Simponi Aria, Actemra SQ  Objective: CMP     Component Value Date/Time   NA 138 03/16/2023 1320   NA 139 02/22/2022 0949   K 4.1 03/16/2023 1320   CL 102 03/16/2023 1320   CO2 29 03/16/2023 1320   GLUCOSE 89 03/16/2023 1320   BUN 16 03/16/2023 1320   BUN 14 02/22/2022 0949   CREATININE 0.71 03/16/2023 1320   CALCIUM 9.1 03/16/2023 1320   PROT 6.7 03/16/2023 1320   PROT 7.1 02/22/2022 0949   ALBUMIN 4.7 02/22/2022 0949   AST 23 03/16/2023 1320   ALT 40 (H) 03/16/2023 1320   ALKPHOS 81 02/22/2022 0949   BILITOT 0.7 03/16/2023 1320   BILITOT 0.6 02/22/2022 0949   GFRNONAA 74 01/07/2021 1335   GFRAA 86 01/07/2021 1335    CBC    Component Value Date/Time   WBC 5.8 03/16/2023 1320   RBC 4.34 03/16/2023 1320   HGB 13.5 03/16/2023 1320   HGB 13.8 02/22/2022 0949   HCT 40.9 03/16/2023 1320   HCT 42.3 02/22/2022 0949   PLT 234 03/16/2023 1320   PLT 289 02/22/2022 0949   MCV 94.2 03/16/2023 1320   MCV 93 02/22/2022 0949   MCH 31.1 03/16/2023 1320   MCHC 33.0 03/16/2023 1320   RDW 11.8 03/16/2023 1320   RDW 12.5 02/22/2022 0949   LYMPHSABS 2,256 03/16/2023 1320   LYMPHSABS 2.4 02/22/2022 0949   MONOABS 856 03/27/2017 1341   EOSABS 139 03/16/2023 1320   EOSABS 0.1 02/22/2022 0949   BASOSABS 29 03/16/2023 1320   BASOSABS 0.0 02/22/2022 0949     Baseline Immunosuppressant Therapy Labs TB gold negative on 07/27/22.  Hepatitis Panel   HIV No results found for: "HIV" Immunoglobulins   SPEP    Latest Ref Rng & Units 03/16/2023    1:20 PM  Serum Protein Electrophoresis  Total Protein 6.1 - 8.1 g/dL  6.7    D6UY No results found for: "G6PDH" TPMT No results found for: "TPMT"   Chest x-ray: 10/28/2020 - Elevation of the left hemidiaphragm without acute abnormality of the lungs  Does the patient have an allergy to sulfa drugs? No  Assessment/Plan: Patient was counseled on the purpose, proper use, and adverse effects of sulfasalazine including risk of infection and chance of nausea, headache, and sun sensitivity.  Also discussed risk of skin rash and advised patient to stop the medication and let us know if she develops a rash. Also discussed for the potential of discoloration of the urine, sweat, or tears.  Advised patient to avoid live vaccines.  Recommend annual influenza, Pneumovax 23, Prevnar 13, and Shingrix as indicated.   Reviewed the importance of frequent labs to monitor liver, kidneys, and blood counts.  Standing orders placed and patient to return 1 month after starting therapy and then every 3 months.  Provided patient with educational materials on sulfasalazine and answered all questions.  Patient consented to sulfasalazine use, and consent will be uploaded into the media tab.    Patient dose will be sulfasalazine IMMEDIATE-RELEASE 500mg  twice daily x 4-5 days, then increase to 1000mg  twice daily. Patient unable to swallow enteric-coated tabs. Prescription  will be sent to pharmacy pending lab results and insurance approval.  Chesley Mires, PharmD, MPH, BCPS, CPP Clinical Pharmacist (Rheumatology and Pulmonology)

## 2023-05-12 ENCOUNTER — Other Ambulatory Visit (HOSPITAL_COMMUNITY): Payer: Self-pay

## 2023-05-15 ENCOUNTER — Other Ambulatory Visit: Payer: Self-pay

## 2023-05-15 ENCOUNTER — Other Ambulatory Visit (HOSPITAL_COMMUNITY): Payer: Self-pay

## 2023-05-16 ENCOUNTER — Other Ambulatory Visit (HOSPITAL_COMMUNITY): Payer: Self-pay

## 2023-05-16 ENCOUNTER — Other Ambulatory Visit: Payer: Self-pay | Admitting: *Deleted

## 2023-05-16 MED ORDER — SULFASALAZINE 500 MG PO TABS
ORAL_TABLET | ORAL | 0 refills | Status: DC
  Filled 2023-05-16: qty 110, 30d supply, fill #0

## 2023-05-16 NOTE — Telephone Encounter (Signed)
sulfasalazine IMMEDIATE-RELEASE 500mg  twice daily x 4-5 days, then increase to 1000mg  twice daily.

## 2023-05-16 NOTE — Progress Notes (Signed)
Glucose is 108. Rest of CMP WNL.  CBC WNL. Lipid panel WNL G6PD WNL--ok to send in sulfasalazine--non-enteric coated tablets.

## 2023-05-16 NOTE — Telephone Encounter (Signed)
-----   Message from Gearldine Bienenstock sent at 05/16/2023  5:30 AM EDT ----- Glucose is 108. Rest of CMP WNL.  CBC WNL. Lipid panel WNL G6PD WNL--ok to send in sulfasalazine--non-enteric coated tablets.

## 2023-05-17 ENCOUNTER — Other Ambulatory Visit (HOSPITAL_COMMUNITY): Payer: Self-pay

## 2023-05-17 DIAGNOSIS — Z6830 Body mass index (BMI) 30.0-30.9, adult: Secondary | ICD-10-CM | POA: Diagnosis not present

## 2023-05-17 DIAGNOSIS — Z01419 Encounter for gynecological examination (general) (routine) without abnormal findings: Secondary | ICD-10-CM | POA: Diagnosis not present

## 2023-05-18 ENCOUNTER — Encounter (INDEPENDENT_AMBULATORY_CARE_PROVIDER_SITE_OTHER): Payer: Self-pay

## 2023-05-18 NOTE — Progress Notes (Signed)
Cardiology Office Note:    Date:  05/19/2023   ID:  [name], DOB Dec 26, 1960, MRN [id number]  PCP:  Zola Button, Grayling Congress, DO   [facility] Providers Cardiologist:  Sabriyah Wilcher    Referring MD: Zola Button, Grayling Congress, *   Chief Complaint  Patient presents with   LBBB   Hypertension    History of Present Illness:    Sharon Hunter is a 62 y.o. female with a hx of hyperlipidemia and LBBB, HTN   I saw Sharon Hunter several years ago for rate related LBBB  Has a hx of HTN and HLD   Echo from Sept. 8, 2023  reveals nl LV function, mild MR   Hiking some ,  walking some  Needs to get more .   Father developed CAD in his 62s Sister developed CAD in her 64 s   Has some DOE with exertion  Ie walking into work .  Not doing spin class or other   Had an allergic response to IV contrast ( 40 years ago )  Eats seafood all the time  Will premedicate prior to coronary CTA   October 27, 2022: Sharon Hunter  is seen today for follow-up visit.  She has a history of left bundle branch block.  She has a history of hypertension and hyperlipidemia. We originally had scheduled her for a coronary CT angiogram last year but it turned out it was going to be too expensive for her and we agreed to postpone that test.  She did have an echocardiogram on June 10, 2022.  This revealed a normal LVEF of 60 to 65%.  Her diastolic parameters were normal. She has mild mitral regurgitation.  Still having lots of issues with her RA .  Trying new meds.   BP ranges from 120-140 / 70-80  Does not like to take the HCTZ  - she has cut her HCTZ  Is on amlodipine   Was well controlled on Losartan but it did not work well after she gained some weight   Is losing weight - going to the [facility] weight loss program    Feb 07, 2023  Sharon Hunter is seen for follow up of her HTN, HLD, LBBB  Coronary calcium score is 37.8 ( all in the LAD). This is 79th percentile for age /sex matched controls   Lipids from Feb.  2024 Chol = 268 HDL = 105 LDL = 138 Trigs = 125  Has been on steroids for a while Is trying new meds for her arthitis  Mostly in her hands   Wants to go to a weight loss clinic  ? Phentermine   Has CAC. Discussed rosuvastatin   No symptoms but she has not been exercising  (RA limits her , her dog is dying )  Has not been back to a gym since COVID )   Aug. 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   Hypertension    IBS (irritable bowel syndrome) 08/01/2016   Joint pain    Lactose intolerance    Migraine    Murmur 02/03/2011   Obese    Ovarian failure    Palpitation    Pulmonary nodule, left 2009   last CT Nov 2011-- due 08/2011   Rheumatoid arthritis(714.0)    in remission , off meds as of Summer 2012   Vitamin D deficiency     Past Surgical History:  Procedure Laterality Date   CHOLECYSTECTOMY  1999   COLONOSCOPY  10/31/11   EXPLORATORY LAPAROTOMY     TONSILLECTOMY  1969   WISDOM TOOTH EXTRACTION      Current Medications: Current Meds  Medication Sig   amLODipine (NORVASC) 5 MG tablet Take 1 tablet (5 mg total) by mouth daily.   Cholecalciferol 125 MCG (5000 UT) CHEW Chew 1 tablet by mouth daily at 6 (six) AM.   cycloSPORINE, PF, (CEQUA) 0.09 % SOLN Place 1 drop into both eyes 2 (two) times daily.   diclofenac Sodium (VOLTAREN) 1 % GEL Apply 2-4 grams to affected area up to 4 times daily.   Doxylamine Succinate, Sleep, (UNISOM PO) Take 1 tablet by mouth at  bedtime.   hydrochlorothiazide (HYDRODIURIL) 25 MG tablet Take 1/2 tablet (12.5 mg total) by mouth daily.   Ibuprofen (MOTRIN PO) Take by mouth as needed.   Magnesium 200 MG CHEW Chew by mouth.   MELATONIN PO Take 5-10 mg by mouth at bedtime.   Multiple Vitamin (MULTIVITAMIN PO) Take by mouth daily.   Romosozumab-aqqg (EVENITY) 105 MG/1. SOSY injection To be given at office.  Pt appt is 05/11/23   rosuvastatin (CRESTOR) 10 MG tablet Take 1 tablet (10 mg total) by mouth daily.   sertraline (ZOLOFT) 50 MG tablet Take 1 tablet (50 mg total) by mouth daily.   sulfaSALAzine (AZULFIDINE) 500 MG tablet Take 1 tablet (500 mg total) by mouth 2 (two) times daily for 5 days, THEN 2 tablets (1,000 mg total) 2 (two) times daily   Tofacitinib Citrate (XELJANZ) 1 MG/ML SOLN Take 5 mLs by mouth in the morning and at bedtime. Discard open bottle after 60 days from opening.   valsartan (DIOVAN) 80 MG tablet Take 1 tablet (80 mg total) by mouth daily.     Allergies:   Other and Contrast media [iodinated contrast media]   Social History   Socioeconomic History   Marital status: Single    Spouse name: Not on file   Number of children: 1   Years of education: 17   Highest education level: Not on file  Occupational History   Occupation: RN  Tobacco Use   Smoking status: Former    Current packs/day: 0.00    Types: Cigarettes    Quit date: 10/03/1990    Years since quitting: 32.6    Passive exposure: Past   Smokeless tobacco: Never  Vaping Use   Vaping status: Never Used  Substance and Sexual Activity   Alcohol use: Yes    Alcohol/week: 3.0 standard drinks of alcohol    Types: 3 Standard drinks or equivalent per week    Comment: Red wine 4 oz occ   Drug use: No   Sexual activity: Not Currently    Partners: Male  Other Topics Concern   Not on file  Social History Narrative   Exercise-- jogs   Social Determinants of Corporate investment banker  Strain: Not on file  Food Insecurity: Not on  file  Transportation Needs: Not on file  Physical Activity: Not on file  Stress: Not on file  Social Connections: Not on file     Family History: The patient's family history includes Alcohol abuse in her brother and father; Arthritis in her mother; Cancer in her brother and maternal aunt; Diabetes in her brother, mother, and sister; Heart disease in her father and sister; High blood pressure in her father; Hyperlipidemia in her father and sister; Hypertension in her brother, mother, and sister; Sleep apnea in her father; Uterine cancer in her mother.  ROS:   Please see the history of present illness.     All other systems reviewed and are negative.  EKGs/Labs/Other Studies Reviewed:    The following studies were reviewed today:   EKG:     Recent Labs: 05/11/2023: ALT 25; BUN 12; Creat 0.58; Hemoglobin 12.2; Platelets 323; Potassium 4.4; Sodium 138  Recent Lipid Panel    Component Value Date/Time   CHOL 184 05/11/2023 1006   CHOL 273 (H) 11/28/2022 1023   TRIG 133 05/11/2023 1006   HDL 91 05/11/2023 1006   HDL 108 11/28/2022 1023   CHOLHDL 2.0 05/11/2023 1006   VLDL 20.0 10/29/2021 0950   LDLCALC 72 05/11/2023 1006   LDLDIRECT 132.6 05/23/2013 1141     Risk Assessment/Calculations:              Physical Exam:    Physical Exam: Blood pressure 124/78, pulse 76, height 5' 6.5" (1.689 m), weight 193 lb (87.5 kg), last menstrual period 10/03/2005, SpO2 98%.     GEN:  Well nourished, well developed in no acute distress HEENT: Normal NECK: No JVD; No carotid bruits LYMPHATICS: No lymphadenopathy CARDIAC: RRR  1-2/6 systolic murmur under left breast  RESPIRATORY:  Clear to auscultation without rales, wheezing or rhonchi  ABDOMEN: Soft, non-tender, non-distended MUSCULOSKELETAL:  No edema; No deformity  SKIN: Warm and dry NEUROLOGIC:  Alert and oriented x 3     ASSESSMENT:    1. LBBB (left bundle branch block)   2. Mixed hyperlipidemia      PLAN:       LBBB:     Very stable.  Her heart sounds normal.  She had an echocardiogram done last year which shows normal left ventricular systolic function.  I think that she can probably wait until she is 34 before she has another echocardiogram unless she develops further problem.  2.  Hypertension: Blood pressure is well-controlled.  Continue current medications.  3.  Hyperlipidemia :    Recent lipids look great.  4.  Weight gain :    Continues to work on her weight      Medication Adjustments/Labs and Tests Ordered: Current medicines are reviewed at length with the patient today.  Concerns regarding medicines are outlined above.  No orders of the defined types were placed in this encounter.  No orders of the defined types were placed in this encounter.    Patient Instructions  Medication Instructions:  Your physician recommends that you continue on your current medications as directed. Please refer to the Current Medication list given to you today.  *If you need a refill on your cardiac medications before your next appointment, please call your pharmacy*   Lab Work: NONE If you have labs (blood work) drawn today and your tests are completely normal, you will receive your results only by: MyChart Message (if you have MyChart) OR A paper  copy in the mail If you have any lab test that is abnormal or we need to change your treatment, we will call you to review the results.  Testing/Procedures: NONE  Follow-Up: At [facility], you and your health needs are our priority.  As part of our continuing mission to provide you with exceptional heart care, we have created designated Provider Care Teams.  These Care Teams include your primary Cardiologist (physician) and Advanced Practice Providers (APPs -  Physician Assistants and Nurse Practitioners) who all work together to provide you with the care you need, when you need it.  Your next appointment:   1 year(s)  Provider:    Kristeen Miss, MD        Signed, Sharon Miss, MD  05/19/2023 5:18 PM    [facility]

## 2023-05-19 ENCOUNTER — Ambulatory Visit: Payer: Commercial Managed Care - PPO | Admitting: Cardiovascular Disease

## 2023-05-19 ENCOUNTER — Encounter: Payer: Self-pay | Admitting: Cardiovascular Disease

## 2023-05-19 VITALS — BP 124/78 | HR 76 | Ht 66.5 in | Wt 193.0 lb

## 2023-05-19 DIAGNOSIS — E782 Mixed hyperlipidemia: Secondary | ICD-10-CM

## 2023-05-19 DIAGNOSIS — I447 Left bundle-branch block, unspecified: Secondary | ICD-10-CM | POA: Diagnosis not present

## 2023-05-19 NOTE — Patient Instructions (Signed)
Medication Instructions:  Your physician recommends that you continue on your current medications as directed. Please refer to the Current Medication list given to you today.  *If you need a refill on your cardiac medications before your next appointment, please call your pharmacy*   Lab Work: NONE If you have labs (blood work) drawn today and your tests are completely normal, you will receive your results only by: [place] (if you have MyChart) OR A paper copy in the mail If you have any lab test that is abnormal or we need to change your treatment, we will call you to review the results.   Testing/Procedures: NONE   Follow-Up: At [facility], you and your health needs are our priority.  As part of our continuing mission to provide you with exceptional heart care, we have created designated Provider Care Teams.  These Care Teams include your primary Cardiologist (physician) and Advanced Practice Providers (APPs -  Physician Assistants and Nurse Practitioners) who all work together to provide you with the care you need, when you need it.  Your next appointment:   1 year(s)  Provider:   Mertie Moores, MD

## 2023-05-30 ENCOUNTER — Other Ambulatory Visit: Payer: Self-pay | Admitting: Physician Assistant

## 2023-05-30 ENCOUNTER — Other Ambulatory Visit (HOSPITAL_COMMUNITY): Payer: Self-pay

## 2023-05-30 ENCOUNTER — Other Ambulatory Visit: Payer: Self-pay | Admitting: Family Medicine

## 2023-05-30 DIAGNOSIS — M0579 Rheumatoid arthritis with rheumatoid factor of multiple sites without organ or systems involvement: Secondary | ICD-10-CM

## 2023-05-30 DIAGNOSIS — M810 Age-related osteoporosis without current pathological fracture: Secondary | ICD-10-CM

## 2023-05-30 DIAGNOSIS — Z79899 Other long term (current) drug therapy: Secondary | ICD-10-CM

## 2023-05-30 MED ORDER — XELJANZ 1 MG/ML PO SOLN
5.0000 mL | Freq: Two times a day (BID) | ORAL | 2 refills | Status: DC
Start: 2023-05-30 — End: 2023-08-25
  Filled 2023-05-31: qty 240, 24d supply, fill #0
  Filled 2023-07-05: qty 240, 24d supply, fill #1
  Filled 2023-07-24: qty 240, 24d supply, fill #2

## 2023-05-30 NOTE — Telephone Encounter (Signed)
Last Fill: 03/27/2023  Labs: 05/11/2023 Glucose is 108. Rest of CMP WNL. CBC WNL. Lipid panel WNL G6PD WNL--ok to send in sulfasalazine--non-enteric coated tablets.  TB Gold: 07/27/2022 negative   Next Visit: 07/12/2023  Last Visit: 05/11/2023  [id number] arthritis involving multiple sites with positive rheumatoid factor   Current Dose per office note 05/11/2023: Harriette Ohara 5 ml by mouth twice daily.   Okay to refill Harriette Ohara?

## 2023-05-31 ENCOUNTER — Other Ambulatory Visit: Payer: Self-pay

## 2023-06-01 ENCOUNTER — Other Ambulatory Visit: Payer: Self-pay

## 2023-06-04 ENCOUNTER — Other Ambulatory Visit: Payer: Self-pay | Admitting: Medical

## 2023-06-06 ENCOUNTER — Other Ambulatory Visit (HOSPITAL_COMMUNITY): Payer: Self-pay

## 2023-06-06 ENCOUNTER — Other Ambulatory Visit: Payer: Self-pay | Admitting: Family Medicine

## 2023-06-06 DIAGNOSIS — M810 Age-related osteoporosis without current pathological fracture: Secondary | ICD-10-CM

## 2023-06-06 MED ORDER — AMLODIPINE BESYLATE 5 MG PO TABS
5.0000 mg | ORAL_TABLET | Freq: Every day | ORAL | 3 refills | Status: DC
  Filled 2023-06-06: qty 90, 90d supply, fill #0
  Filled 2023-08-31: qty 90, 90d supply, fill #1
  Filled 2023-11-29: qty 90, 90d supply, fill #2
  Filled 2024-02-27: qty 90, 90d supply, fill #3

## 2023-06-08 ENCOUNTER — Other Ambulatory Visit: Payer: Self-pay

## 2023-06-08 ENCOUNTER — Other Ambulatory Visit (HOSPITAL_BASED_OUTPATIENT_CLINIC_OR_DEPARTMENT_OTHER): Payer: Self-pay

## 2023-06-08 ENCOUNTER — Other Ambulatory Visit (HOSPITAL_COMMUNITY): Payer: Self-pay

## 2023-06-08 MED ORDER — EVENITY 105 MG/1.17ML ~~LOC~~ SOSY
PREFILLED_SYRINGE | SUBCUTANEOUS | 6 refills | Status: DC
Start: 2023-06-08 — End: 2023-06-16
  Filled 2023-06-08: qty 2.34, 28d supply, fill #0

## 2023-06-09 ENCOUNTER — Other Ambulatory Visit: Payer: Self-pay

## 2023-06-09 ENCOUNTER — Other Ambulatory Visit (HOSPITAL_COMMUNITY): Payer: Self-pay

## 2023-06-12 ENCOUNTER — Other Ambulatory Visit: Payer: Self-pay

## 2023-06-14 ENCOUNTER — Other Ambulatory Visit (HOSPITAL_COMMUNITY): Payer: Self-pay

## 2023-06-14 ENCOUNTER — Ambulatory Visit (INDEPENDENT_AMBULATORY_CARE_PROVIDER_SITE_OTHER): Payer: Commercial Managed Care - PPO

## 2023-06-14 DIAGNOSIS — M810 Age-related osteoporosis without current pathological fracture: Secondary | ICD-10-CM | POA: Diagnosis not present

## 2023-06-14 MED ORDER — ROMOSOZUMAB-AQQG 105 MG/1.17ML ~~LOC~~ SOSY
210.0000 mg | PREFILLED_SYRINGE | Freq: Once | SUBCUTANEOUS | Status: AC
  Administered 2023-06-14: 210 mg via SUBCUTANEOUS

## 2023-06-14 NOTE — Progress Notes (Signed)
Pt here for Evenity per [place]. Injection given subcutaneous in both arms. Pt handled well. Pt scheduled for month out.

## 2023-06-16 ENCOUNTER — Other Ambulatory Visit (HOSPITAL_COMMUNITY): Payer: Self-pay

## 2023-06-16 ENCOUNTER — Other Ambulatory Visit: Payer: Self-pay | Admitting: Pharmacist

## 2023-06-16 DIAGNOSIS — M810 Age-related osteoporosis without current pathological fracture: Secondary | ICD-10-CM

## 2023-06-16 MED ORDER — EVENITY 105 MG/1.17ML ~~LOC~~ SOSY
PREFILLED_SYRINGE | SUBCUTANEOUS | 6 refills | Status: DC
  Filled 2023-06-16: qty 2.34, fill #0
  Filled 2023-07-05: qty 2.34, 28d supply, fill #0
  Filled 2023-07-24: qty 2.34, 28d supply, fill #1
  Filled 2023-09-13: qty 2.34, 28d supply, fill #2
  Filled 2023-10-24: qty 2.34, 28d supply, fill #3

## 2023-06-18 ENCOUNTER — Other Ambulatory Visit: Payer: Self-pay | Admitting: Physician Assistant

## 2023-06-19 ENCOUNTER — Other Ambulatory Visit (HOSPITAL_COMMUNITY): Payer: Self-pay

## 2023-06-19 ENCOUNTER — Other Ambulatory Visit: Payer: Self-pay

## 2023-06-19 DIAGNOSIS — H5213 Myopia, bilateral: Secondary | ICD-10-CM | POA: Diagnosis not present

## 2023-06-19 DIAGNOSIS — H52221 Regular astigmatism, right eye: Secondary | ICD-10-CM | POA: Diagnosis not present

## 2023-06-19 DIAGNOSIS — H524 Presbyopia: Secondary | ICD-10-CM | POA: Diagnosis not present

## 2023-06-19 MED ORDER — SULFASALAZINE 500 MG PO TABS
1000.0000 mg | ORAL_TABLET | Freq: Two times a day (BID) | ORAL | 2 refills | Status: DC
  Filled 2023-06-19 (×2): qty 120, 30d supply, fill #0

## 2023-06-19 NOTE — Telephone Encounter (Signed)
Last Fill: 05/16/2023  Labs: 05/11/2023 glucose is 108. Rest of CMP WNL. CBC WNL.  Next Visit: 07/12/2023  Last Visit: 05/11/2023  DX: Rheumatoid arthritis involving multiple sites with positive rheumatoid factor   Current Dose per office note 05/11/2023: sulfasalazine IMMEDIATE-RELEASE 500mg  twice daily x 4-5 days, then increase to 1000mg  twice daily   Spoke with patient and she states she has been able to increase her medication to SSZ 2 tabs twice daily. Patient reminded she is due to update labs. Patient states she will come on Thursday to update.   Okay to refill Sulfasalazine?

## 2023-06-22 ENCOUNTER — Encounter: Payer: Self-pay | Admitting: Rheumatology

## 2023-06-24 ENCOUNTER — Other Ambulatory Visit: Payer: Self-pay

## 2023-06-26 ENCOUNTER — Other Ambulatory Visit (HOSPITAL_COMMUNITY): Payer: Self-pay

## 2023-06-27 ENCOUNTER — Other Ambulatory Visit: Payer: Self-pay

## 2023-06-28 ENCOUNTER — Other Ambulatory Visit: Payer: Self-pay | Admitting: *Deleted

## 2023-06-28 ENCOUNTER — Other Ambulatory Visit (HOSPITAL_COMMUNITY): Payer: Self-pay

## 2023-06-28 DIAGNOSIS — Z79899 Other long term (current) drug therapy: Secondary | ICD-10-CM

## 2023-06-28 DIAGNOSIS — Z5181 Encounter for therapeutic drug level monitoring: Secondary | ICD-10-CM | POA: Diagnosis not present

## 2023-06-28 DIAGNOSIS — M0579 Rheumatoid arthritis with rheumatoid factor of multiple sites without organ or systems involvement: Secondary | ICD-10-CM

## 2023-06-28 MED ORDER — PREDNISONE 5 MG PO TABS
ORAL_TABLET | ORAL | 0 refills | Status: AC
  Filled 2023-06-28: qty 40, 16d supply, fill #0

## 2023-06-28 NOTE — Telephone Encounter (Signed)
Ok to send in prednisone 20 mg tapering by 5 mg every 4 days.  Take prednisone in the morning with food and avoid the use of NSAIDs.

## 2023-06-28 NOTE — Progress Notes (Unsigned)
Office Visit Note  Patient: Sharon Hunter             Date of Birth: October 16, 1960           MRN: [id number]             PCP: Donato Schultz, DO Referring: Donato Schultz, * Visit Date: 07/12/2023 Occupation: @GUAROCC @  Subjective:  Discuss medication options   History of Present Illness: DESI FRED is a 62 y.o. female with history of seropositive rheumatoid arthritis and osteoarthritis.  She is currently prescribed Xeljanz 5mg  liquid p.o. twice daily and sulfasalazine 500 mg 2 tablets by mouth twice daily. She is unable to swallow tablets whole. She has not noticed any improvement in her symptoms since adding on sulfasalazine after her last office visit.  She has been experiencing GI side effects since initiating sulfasalazine including upset stomach as well as reflux at night.  She has reduced Xeljanz to once daily and sulfasalazine to once daily which has alleviated her side effects.  Patient presents today to discuss treatment options.  She continues to have persistent pain and inflammation involving both shoulders and both hands.  Patient requested a referral to physical therapy for her shoulder pain which has been placed.  She continues to have significant fatigue and does not feel well which she attributes to the ongoing inflammation she has been experiencing. She is considering establishing care with a functional medicine rheumatologist in [place] to see if there are any other triggers for her flares.    Activities of Daily Living:  Patient reports morning stiffness for 8  hours.   Patient Reports nocturnal pain.  Difficulty dressing/grooming: Reports Difficulty climbing stairs: Denies Difficulty getting out of chair: Denies Difficulty using hands for taps, buttons, cutlery, and/or writing: Reports  Review of Systems  Constitutional:  Positive for fatigue.  HENT:  Positive for mouth dryness. Negative for mouth sores.   Eyes:  Positive for dryness.  Respiratory:   Negative for shortness of breath.   Cardiovascular:  Negative for chest pain and palpitations.  Gastrointestinal:  Negative for blood in stool, constipation and diarrhea.  Endocrine: Negative for increased urination.  Genitourinary:  Negative for involuntary urination.  Musculoskeletal:  Positive for joint pain, joint pain, joint swelling, myalgias, muscle weakness, morning stiffness, muscle tenderness and myalgias. Negative for gait problem.  Skin:  Positive for hair loss and sensitivity to sunlight. Negative for color change and rash.  Allergic/Immunologic: Negative for susceptible to infections.  Neurological:  Negative for dizziness and headaches.  Hematological:  Negative for swollen glands.  Psychiatric/Behavioral:  Negative for depressed mood and sleep disturbance. The patient is not nervous/anxious.     PMFS History:  Patient Active Problem List   Diagnosis Date Noted   Mood disorder (HCC)- Emotional Eating 08/02/2022   Class 1 obesity with serious comorbidity and body mass index (BMI) of 30.0 to 30.9 in adult 08/02/2022   At risk for impaired metabolic function 05/30/2022   Perioral dermatitis 05/26/2022   Preventative health care 05/26/2022   Essential hypertension 05/26/2022   Abnormal EKG 05/26/2022   Prediabetes 03/09/2022   Vitamin B12 deficiency 03/09/2022   Primary hypertension 02/22/2022   Hyperlipidemia 02/22/2022   GAD (generalized anxiety disorder) 02/22/2022   Vitamin D deficiency 02/22/2022   Ear pain, left 08/13/2021   History of anxiety 03/27/2017   History of hypertension 03/27/2017   History of bundle branch block 03/27/2017   Primary osteoarthritis of both hands 12/22/2016  History of shingles 08/02/2016   High risk medication use 08/02/2016   Hypertension 08/01/2016   IBS (irritable bowel syndrome) 08/01/2016   Anxiety 08/01/2016   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   LBBB (left bundle branch block) 01/30/2015   Palpitations 01/30/2015    Pulmonary nodule 07/04/2011   Murmur 02/03/2011   Rheumatoid arthritis (HCC) 02/03/2011   Hair loss 02/03/2011   Fatigue 02/03/2011    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   Hypertension    IBS (irritable bowel syndrome) 08/01/2016   Joint pain    Lactose intolerance    Migraine    Murmur 02/03/2011   Obese    Ovarian failure    Palpitation    Pulmonary nodule, left 2009   last CT Nov 2011-- due 08/2011   Rheumatoid arthritis(714.0)    in remission , off meds as of Summer 2012   Vitamin D deficiency     Family History  Problem Relation Age of Onset   Arthritis Mother    Uterine cancer Mother    Hypertension Mother    Diabetes Mother    Alcohol abuse Father    Hyperlipidemia Father    Heart disease Father    High blood pressure Father    Sleep apnea Father    Heart disease Sister        cardiac stent   Diabetes Sister    Hyperlipidemia Sister    Hypertension Sister    Alcohol abuse Brother    Diabetes Brother    Hypertension Brother    Cancer Brother        lung   Cancer Maternal Aunt        lung----non smoker   Past Surgical History:  Procedure Laterality Date   CHOLECYSTECTOMY  1999   COLONOSCOPY  10/31/11   EXPLORATORY LAPAROTOMY     TONSILLECTOMY  1969   WISDOM TOOTH EXTRACTION     Social History   Social History Narrative   Exercise-- jogs   Immunization History  Administered Date(s) Administered   Influenza Split 07/03/2013   Influenza,inj,Quad PF,6-35 Mos 07/17/2020   Influenza-Unspecified 07/03/2014, 07/04/2015   PFIZER(Purple Top)SARS-COV-2 Vaccination 01/13/2020, 05/15/2020   Tdap 12/01/2009     Objective: Vital Signs: BP 131/82 (BP Location: Left Arm, Patient Position: Sitting, Cuff Size: Small)   Pulse 65   Resp 12   Ht 5\' 7"  (1.702 m)   Wt 193 lb (87.5 kg) Comment: Patient reported. Patient refused to weigh in office  today.  LMP 10/03/2005   BMI 30.23 kg/m    Physical Exam Vitals and nursing note reviewed.  Constitutional:      Appearance: She is well-developed.  HENT:     Head: Normocephalic and atraumatic.  Eyes:     Conjunctiva/sclera: Conjunctivae normal.  Cardiovascular:     Rate and Rhythm: Normal rate and regular rhythm.     Heart sounds: Normal heart sounds.  Pulmonary:     Effort: Pulmonary effort is normal.     Breath sounds: Normal breath sounds.  Abdominal:     General: Bowel sounds are normal.     Palpations: Abdomen is soft.  Musculoskeletal:     Cervical back: Normal range of motion.  Lymphadenopathy:     Cervical: No cervical adenopathy.  Skin:    General: Skin is warm and dry.     Capillary Refill:  Capillary refill takes less than 2 seconds.  Neurological:     Mental Status: She is alert and oriented to person, place, and time.  Psychiatric:        Behavior: Behavior normal.      Musculoskeletal Exam: Painful range of motion of both shoulder joints especially the right shoulder.  No tenderness or synovitis along the elbow joint line.  Wrist joints have good range of motion with mild tenderness.  Tenderness and synovitis of bilateral second and third MCP joints.  Inflammation noted in the right first PIP joint.  Tenderness over bilateral second and third MCP joints.  Hip joints have good range of motion with no groin pain.  Knee joints have good range of motion no warmth or effusion.  Synovial thickening of the left ankle noted.  No tenderness or synovitis over MTP joints currently.  CDAI Exam: CDAI Score: 25  Patient Global: 30 / 100; Provider Global: 60 / 100 Swollen: 5 ; Tender: 11  Joint Exam 07/12/2023      Right  Left  Glenohumeral   Tender   Tender  MCP 2  Swollen Tender  Swollen Tender  MCP 3  Swollen Tender  Swollen Tender  IP (thumb)  Swollen Tender     PIP 2 (finger)   Tender   Tender  PIP 3 (finger)   Tender   Tender     Investigation: No  additional findings.  Imaging: No results found.  Recent Labs: Lab Results  Component Value Date   WBC 9.5 06/28/2023   HGB 12.4 06/28/2023   PLT 266 06/28/2023   NA 138 06/28/2023   K 4.2 06/28/2023   CL 102 06/28/2023   CO2 26 06/28/2023   GLUCOSE 112 (H) 06/28/2023   BUN 20 06/28/2023   CREATININE 0.64 06/28/2023   BILITOT 0.5 06/28/2023   ALKPHOS 81 02/22/2022   AST 16 06/28/2023   ALT 24 06/28/2023   PROT 7.0 06/28/2023   ALBUMIN 4.7 02/22/2022   CALCIUM 9.6 06/28/2023   GFRAA 86 01/07/2021   QFTBGOLDPLUS NEGATIVE 09/12/2018    Speciality Comments: TB Gold: 07/25/2022 Neg  Prior therapy: methotrexate, remicade, orencia, and enbrel (inadequate response), Simponi stopped Dec 2023, Actemra started 10/17/22-June 2024; Harriette Ohara liq started 03/27/23  Procedures:  No procedures performed Allergies: Other and Contrast media [iodinated contrast media]   Assessment / Plan:     Visit Diagnoses: Rheumatoid arthritis involving multiple sites with positive rheumatoid factor (HCC): Patient continues to experience frequent and severe rheumatoid arthritis flares primarily involving both shoulders and both hands.  On examination today she has painful range of motion of both shoulders with tenderness palpation over the right shoulder.  She has tenderness and synovitis of bilateral second and third MCP joints as well as the right first PIP joint.  She is currently prescribed Xeljanz 5 ml BID solution and sulfasalazine 500 mg 2 tablets by mouth twice daily.  Sulfasalazine was added after her last office visit.  She has been experiencing increased GI side effects since adding on sulfasalazine.  Plan to discontinue sulfasalazine due to inadequate response and GI side effects.  Different treatment options were discussed today.  Plan to add on Arava as combination therapy.  Indications, contraindications, potential side effects of Arava were discussed today in detail.  All questions were addressed and  consent was obtained.  Plan to start Arava 10 mg daily x 2 weeks and if labs are stable she will increase to 20 mg daily.  She will remain  on [place] as combination therapy for now.  If she continues to have recurrent flares she would like to consider restarting IV Remicade infusions which were previously very effective for her.  She will follow-up in the office in 6 weeks to assess her response.   Medication counseling:  Patient was counseled on the purpose, proper use, and adverse effects of leflunomide including risk of infection, nausea/diarrhea/weight loss, increase in blood pressure, rash, hair loss, tingling in the hands and feet, and signs and symptoms of interstitial lung disease.   Also counseled on Black Box warning of liver injury and importance of avoiding alcohol while on therapy. Discussed that there is the possibility of an increased risk of malignancy but it is not well understood if this increased risk is due to the medication or the disease state.  Counseled patient to avoid live vaccines. Recommend annual influenza, Pneumovax 23, Prevnar 13, and Shingrix as indicated.   Discussed the importance of frequent monitoring of liver function and blood count.  Standing orders placed.  Discussed importance of birth control while on leflunomide due to risk of congenital abnormalities, and patient confirms postmenopausal.  Provided patient with educational materials on leflunomide and answered all questions.  Patient consented to [place] use, and consent will be uploaded into the media tab.   Patient dose will be 10 mg daily x2 weeks and if labs are stable she will increase to 20 mg daily.  Prescription pending lab results and/or insurance approval.  High risk medication use -Plan to add on Arava 10 mg daily x2 weeks and if labs are stable she will increase to 20 mg daily.  She will remain on Xeljanz 5 ml by mouth twice daily.   CBC and CMP updated on 06/28/23.  She will return for updated lab work in  2 weeks x 2 then every 3 months. TB gold negative on 07/27/22.  Patient plans on having updated TB Gold with health at work.  Previous therapy includes methotrexate, Remicade, Orencia, Enbrel, Simponi, and actemra, and sulfasalazine.  Unable to swallow tablets whole.  No recent or recurrent infections.  Discussed the importance of holding arava and xeljanz if she develops signs or symptoms of an infection and to resume once the infection has completely cleared.   Chronic pain of both shoulders: Chronic pain and stiffness.  Limited ROM with discomfort.  Nocturnal pain intermittently.  Patient declined a shoulder joint injection today.  Referral to PT was placed as requested.   Primary osteoarthritis of both hands: PIP and DIP thickening.  She continues to have recurrent rheumatoid arthritis flares. Plan to switch from sulfasalazine to arava as discussed above.  Right ankle swelling: No synovitis noted on examination today.  Intermittent stiffness in the right ankle.   Other fatigue: She has been experiencing profound fatigue which she attributes to frequent and severe RA flares.    Sicca syndrome (HCC): Chronic, unchanged.  Age-related osteoporosis without current pathological fracture: History of long term prednisone use.  DEXA 12/03/20: The BMD measured at [facility] L1-L2 is 0.851 g/cm2 with a T-score of -2.6. She is taking vitamin D 5000 units daily.  Currently on Evenity monthly injections.   History of vitamin D deficiency: She is taking vitamin D 5000 units daily.  Other medical conditions are listed as follows:   History of anxiety  History of hypertension: Blood pressure was 131/82 today in the office.  Pulmonary nodule  History of shingles  History of bundle branch block  History of hyperlipidemia  Orders: No orders of the defined types were placed in this encounter.  No orders of the defined types were placed in this encounter.   Follow-Up Instructions: Return in  about 6 weeks (around 08/23/2023) for Rheumatoid arthritis, Osteoarthritis.   Gearldine Bienenstock, PA-C  Note - This record has been created using Dragon software.  Chart creation errors have been sought, but may not always  have been located. Such creation errors do not reflect on  the standard of medical care.

## 2023-06-29 LAB — COMPLETE METABOLIC PANEL WITH GFR
AG Ratio: 1.8 (calc) (ref 1.0–2.5)
ALT: 24 U/L (ref 6–29)
AST: 16 U/L (ref 10–35)
Albumin: 4.5 g/dL (ref 3.6–5.1)
Alkaline phosphatase (APISO): 71 U/L (ref 37–153)
BUN: 20 mg/dL (ref 7–25)
CO2: 26 mmol/L (ref 20–32)
Calcium: 9.6 mg/dL (ref 8.6–10.4)
Chloride: 102 mmol/L (ref 98–110)
Creat: 0.64 mg/dL (ref 0.50–1.05)
Globulin: 2.5 g/dL (calc) (ref 1.9–3.7)
Glucose, Bld: 112 mg/dL — ABNORMAL HIGH (ref 65–99)
Potassium: 4.2 mmol/L (ref 3.5–5.3)
Sodium: 138 mmol/L (ref 135–146)
Total Bilirubin: 0.5 mg/dL (ref 0.2–1.2)
Total Protein: 7 g/dL (ref 6.1–8.1)
eGFR: 100 mL/min/{1.73_m2} (ref >=60)

## 2023-06-29 LAB — CBC WITH DIFFERENTIAL/PLATELET
Absolute Monocytes: 323 cells/uL (ref 200–950)
Basophils Absolute: 29 cells/uL (ref 0–200)
Basophils Relative: 0.3 %
Eosinophils Absolute: 10 cells/uL — ABNORMAL LOW (ref 15–500)
Eosinophils Relative: 0.1 %
HCT: 37.8 % (ref 35.0–45.0)
Hemoglobin: 12.4 g/dL (ref 11.7–15.5)
Lymphs Abs: 779 cells/uL — ABNORMAL LOW (ref 850–3900)
MCH: 31.3 pg (ref 27.0–33.0)
MCHC: 32.8 g/dL (ref 32.0–36.0)
MCV: 95.5 fL (ref 80.0–100.0)
MPV: 10.4 fL (ref 7.5–12.5)
Monocytes Relative: 3.4 %
Neutro Abs: 8360 cells/uL — ABNORMAL HIGH (ref 1500–7800)
Neutrophils Relative %: 88 %
Platelets: 266 10*3/uL (ref 140–400)
RBC: 3.96 10*6/uL (ref 3.80–5.10)
RDW: 12.2 % (ref 11.0–15.0)
Total Lymphocyte: 8.2 %
WBC: 9.5 10*3/uL (ref 3.8–10.8)

## 2023-06-29 NOTE — Progress Notes (Signed)
CBC and CMP is stable.  Lymphocyte count is low due to immunosuppression.

## 2023-07-03 ENCOUNTER — Other Ambulatory Visit (HOSPITAL_COMMUNITY): Payer: Self-pay

## 2023-07-04 ENCOUNTER — Other Ambulatory Visit: Payer: Self-pay

## 2023-07-05 ENCOUNTER — Other Ambulatory Visit: Payer: Self-pay

## 2023-07-05 NOTE — Progress Notes (Signed)
Specialty Pharmacy Refill Coordination Note  Sharon Hunter is a 62 y.o. female contacted today regarding refills of specialty medication(s) Tofacitinib Citrate (Antirheumatic - Enzyme Inhibitors)   Patient requested Delivery   Delivery date: 07/07/23   Verified address: Patient address [address]  [place] [address]   Medication will be filled on 07/06/23.

## 2023-07-05 NOTE — Progress Notes (Signed)
Specialty Pharmacy Refill Coordination Note  Sharon Hunter is a 62 y.o. female contacted today regarding refills of specialty medication(s) Romosozumab-Aqqg   Patient requested Courier to Provider Office   Delivery date: 07/07/23   Verified address: [address] [facility] [place] [address]   Medication will be filled on 07/06/23.

## 2023-07-06 ENCOUNTER — Other Ambulatory Visit (HOSPITAL_COMMUNITY): Payer: Self-pay

## 2023-07-06 ENCOUNTER — Other Ambulatory Visit: Payer: Self-pay

## 2023-07-07 ENCOUNTER — Telehealth: Payer: Self-pay | Admitting: Family Medicine

## 2023-07-07 NOTE — Telephone Encounter (Signed)
Received Evenity from [employer]. Placed in clinic refrigerator.

## 2023-07-07 NOTE — Telephone Encounter (Signed)
Pt has nurse visit on 07/12/23.

## 2023-07-08 ENCOUNTER — Other Ambulatory Visit: Payer: Self-pay | Admitting: Family Medicine

## 2023-07-10 ENCOUNTER — Other Ambulatory Visit (HOSPITAL_COMMUNITY): Payer: Self-pay

## 2023-07-10 ENCOUNTER — Other Ambulatory Visit: Payer: Self-pay

## 2023-07-10 DIAGNOSIS — M25511 Pain in right shoulder: Secondary | ICD-10-CM

## 2023-07-10 MED ORDER — SERTRALINE HCL 50 MG PO TABS
50.0000 mg | ORAL_TABLET | Freq: Every day | ORAL | 0 refills | Status: DC
  Filled 2023-07-10: qty 30, 30d supply, fill #0

## 2023-07-10 NOTE — Telephone Encounter (Signed)
We can discuss treatment options at upcoming visit.    Ok to place referral to PT for bilateral shoulder pain.

## 2023-07-11 ENCOUNTER — Other Ambulatory Visit: Payer: Self-pay

## 2023-07-12 ENCOUNTER — Other Ambulatory Visit (HOSPITAL_COMMUNITY): Payer: Self-pay

## 2023-07-12 ENCOUNTER — Ambulatory Visit: Payer: Commercial Managed Care - PPO | Attending: Physician Assistant | Admitting: Physician Assistant

## 2023-07-12 ENCOUNTER — Ambulatory Visit: Payer: Commercial Managed Care - PPO

## 2023-07-12 ENCOUNTER — Encounter: Payer: Self-pay | Admitting: Physician Assistant

## 2023-07-12 ENCOUNTER — Other Ambulatory Visit: Payer: Self-pay

## 2023-07-12 VITALS — BP 131/82 | HR 65 | Resp 12 | Ht 67.0 in | Wt 193.0 lb

## 2023-07-12 DIAGNOSIS — M19041 Primary osteoarthritis, right hand: Secondary | ICD-10-CM

## 2023-07-12 DIAGNOSIS — M3500 Sicca syndrome, unspecified: Secondary | ICD-10-CM

## 2023-07-12 DIAGNOSIS — M25511 Pain in right shoulder: Secondary | ICD-10-CM | POA: Diagnosis not present

## 2023-07-12 DIAGNOSIS — Z8619 Personal history of other infectious and parasitic diseases: Secondary | ICD-10-CM

## 2023-07-12 DIAGNOSIS — Z7901 Long term (current) use of anticoagulants: Secondary | ICD-10-CM

## 2023-07-12 DIAGNOSIS — M19042 Primary osteoarthritis, left hand: Secondary | ICD-10-CM

## 2023-07-12 DIAGNOSIS — M25512 Pain in left shoulder: Secondary | ICD-10-CM

## 2023-07-12 DIAGNOSIS — M25471 Effusion, right ankle: Secondary | ICD-10-CM

## 2023-07-12 DIAGNOSIS — Z8639 Personal history of other endocrine, nutritional and metabolic disease: Secondary | ICD-10-CM | POA: Diagnosis not present

## 2023-07-12 DIAGNOSIS — G8929 Other chronic pain: Secondary | ICD-10-CM

## 2023-07-12 DIAGNOSIS — R5383 Other fatigue: Secondary | ICD-10-CM | POA: Diagnosis not present

## 2023-07-12 DIAGNOSIS — M810 Age-related osteoporosis without current pathological fracture: Secondary | ICD-10-CM

## 2023-07-12 DIAGNOSIS — Z8679 Personal history of other diseases of the circulatory system: Secondary | ICD-10-CM | POA: Diagnosis not present

## 2023-07-12 DIAGNOSIS — Z8659 Personal history of other mental and behavioral disorders: Secondary | ICD-10-CM

## 2023-07-12 DIAGNOSIS — M0579 Rheumatoid arthritis with rheumatoid factor of multiple sites without organ or systems involvement: Secondary | ICD-10-CM

## 2023-07-12 DIAGNOSIS — Z79899 Other long term (current) drug therapy: Secondary | ICD-10-CM | POA: Diagnosis not present

## 2023-07-12 DIAGNOSIS — R911 Solitary pulmonary nodule: Secondary | ICD-10-CM

## 2023-07-12 MED ORDER — LEFLUNOMIDE 10 MG PO TABS
ORAL_TABLET | ORAL | 0 refills | Status: DC
  Filled 2023-07-12: qty 42, 30d supply, fill #0

## 2023-07-12 MED ORDER — ROMOSOZUMAB-AQQG 105 MG/1.17ML ~~LOC~~ SOSY
210.0000 mg | PREFILLED_SYRINGE | Freq: Once | SUBCUTANEOUS | Status: AC
Start: 2023-07-12 — End: 2023-07-12
  Administered 2023-07-12: 210 mg via SUBCUTANEOUS

## 2023-07-12 NOTE — Telephone Encounter (Signed)
Please review and sign pended Arava rx. Thanks!

## 2023-07-12 NOTE — Progress Notes (Signed)
Pt here for Evenity per [place]. Injection given subcutaneous in both arms.  Pt supplied. Pt handled well. Pt scheduled for month out.

## 2023-07-12 NOTE — Patient Instructions (Addendum)
Standing Labs We placed an order today for your standing lab work.   Please have your standing labs drawn in 2 weeks x2   Please have your labs drawn 2 weeks prior to your appointment so that the provider can discuss your lab results at your appointment, if possible.  Please note that you may see your imaging and lab results in MyChart before we have reviewed them. We will contact you once all results are reviewed. Please allow our office up to 72 hours to thoroughly review all of the results before contacting the office for clarification of your results.  WALK-IN LAB HOURS  Monday through Thursday from 8:00 am -12:30 pm and 1:00 pm-5:00 pm and Friday from 8:00 am-12:00 pm.  Patients with office visits requiring labs will be seen before walk-in labs.  You may encounter longer than normal wait times. Please allow additional time. Wait times may be shorter on  Monday and Thursday afternoons.  We do not book appointments for walk-in labs. We appreciate your patience and understanding with our staff.   Labs are drawn by [facility]. Please bring your co-pay at the time of your lab draw.  You may receive a bill from [facility] for your lab work.  Please note if you are on Hydroxychloroquine and and an order has been placed for a Hydroxychloroquine level,  you will need to have it drawn 4 hours or more after your last dose.  If you wish to have your labs drawn at another location, please call the office 24 hours in advance so we can fax the orders.  The office is located at [address], [place], [place] [address]   If you have any questions regarding directions or hours of operation,  please call [phone].   As a reminder, please drink plenty of water prior to coming for your lab work. Thanks!    Leflunomide Tablets What is this medication? LEFLUNOMIDE (le FLOO na mide) treats the symptoms of rheumatoid arthritis. It works by slowing down an overactive immune system. This  decreases inflammation. It belongs to a group of medications called DMARDs. This medicine may be used for other purposes; ask your health care provider or pharmacist if you have questions. COMMON BRAND NAME(S): Arava What should I tell my care team before I take this medication? They need to know if you have any of these conditions: Cancer Diabetes High blood pressure Immune system problems Infection Kidney disease Liver disease Low blood cell levels (white cells, red cells, and platelets) Lung or breathing disease, such as asthma or COPD Recent or upcoming vaccine Skin conditions Tingling of the fingers or toes, or other nerve disorder An unusual or allergic reaction to leflunomide, other medications, food, dyes, or preservatives Pregnant or trying to get pregnant Breastfeeding How should I use this medication? Take this medication by mouth with a full glass of water. Take it as directed on the prescription label at the same time every day. Keep taking it unless your care team tells you to stop. Talk to your care team about the use of this medication in children. Special care may be needed. Overdosage: If you think you have taken too much of this medicine contact a poison control center or emergency room at once. NOTE: This medicine is only for you. Do not share this medicine with others. What if I miss a dose? If you miss a dose, take it as soon as you can. If it is almost time for your next dose,  take only that dose. Do not take double or extra doses. What may interact with this medication? Do not take this medication with any of the following: Teriflunomide This medication may also interact with the following: Alosetron Caffeine Cefaclor Certain medications for diabetes, such as nateglinide, repaglinide, rosiglitazone, pioglitazone Certain medications for high cholesterol, such as atorvastatin, pravastatin, rosuvastatin,  simvastatin Charcoal Cholestyramine Ciprofloxacin Duloxetine Estrogen and progestin hormones Furosemide Ketoprofen Live virus vaccines Medications that increase your risk for infection Methotrexate Mitoxantrone Paclitaxel Penicillin Theophylline Tizanidine Warfarin This list may not describe all possible interactions. Give your health care provider a list of all the medicines, herbs, non-prescription drugs, or dietary supplements you use. Also tell them if you smoke, drink alcohol, or use illegal drugs. Some items may interact with your medicine. What should I watch for while using this medication? Visit your care team for regular checks on your progress. Tell your care team if your symptoms do not start to get better or if they get worse. You may need blood work done while you are taking this medication. This medication may cause serious skin reactions. They can happen weeks to months after starting the medication. Contact your care team right away if you notice fevers or flu-like symptoms with a rash. The rash may be red or purple and then turn into blisters or peeling of the skin. You may also notice a red rash with swelling of the face, lips, or lymph nodes in your neck or under your arms. You should not receive certain vaccines during your treatment and for a certain time after your treatment with this medication ends. Talk to your care team for more information. This medication may stay in your body for up to 2 years after your last dose. Tell your care team about any unusual side effects or symptoms. A medication can be given to help lower your blood levels of this medication more quickly. Talk to your care team if you may be pregnant. This medication can cause serious birth defects if taken during pregnancy and for a while after the last dose. You will need a negative pregnancy test before starting this medication. Contraception is recommended while taking this medication and for a  while after the last dose. Your care team can help you find the option that works for you. Do not breastfeed while taking this medication. What side effects may I notice from receiving this medication? Side effects that you should report to your care team as soon as possible: Allergic reactions--skin rash, itching, hives, swelling of the face, lips, tongue, or throat Dry cough, shortness of breath or trouble breathing Increase in blood pressure Infection--fever, chills, cough, sore throat, wounds that don't heal, pain or trouble when passing urine, general feeling of discomfort or being unwell Redness, blistering, peeling, or loosening of the skin, including inside the mouth Liver injury--right upper belly pain, loss of appetite, nausea, light-colored stool, dark yellow or brown urine, yellowing skin or eyes, unusual weakness or fatigue Pain, tingling, or numbness in the hands or feet Unusual bruising or bleeding Side effects that usually do not require medical attention (report to your care team if they continue or are bothersome): Back pain Diarrhea Hair loss Headache Nausea This list may not describe all possible side effects. Call your doctor for medical advice about side effects. You may report side effects to FDA at 1-800-FDA-1088. Where should I keep my medication? Keep out of the reach of children and pets. Store at room temperature between 20 and  25 degrees C (68 and 77 degrees F). Protect from moisture and light. Keep the container tightly closed. Get rid of any unused medication after the expiration date. To get rid of medications that are no longer needed or have expired: Take the medication to a medication take-back program. Check with your pharmacy or law enforcement to find a location. If you cannot return the medication, ask your pharmacist or care team how to get rid of this medication safely. NOTE: This sheet is a summary. It may not cover all possible information. If you  have questions about this medicine, talk to your doctor, pharmacist, or health care provider.  2024 Elsevier/Gold Standard (2022-02-17 00:00:00)

## 2023-07-19 ENCOUNTER — Other Ambulatory Visit: Payer: Self-pay

## 2023-07-24 ENCOUNTER — Other Ambulatory Visit: Payer: Self-pay

## 2023-07-24 NOTE — Progress Notes (Signed)
Specialty Pharmacy Refill Coordination Note  Sharon Hunter is a 62 y.o. female contacted today regarding refills of specialty medication(s) Tofacitinib Citrate (Antirheumatic - Enzyme Inhibitors)   Patient requested Delivery   Delivery date: 08/07/23   Verified address: Patient address [address]  [place] [address]   Medication will be filled on 07/04/23.

## 2023-07-24 NOTE — Progress Notes (Signed)
Specialty Pharmacy Refill Coordination Note  Sharon Hunter is a 62 y.o. female contacted today regarding refills of specialty medication(s) Romosozumab-Aqqg   Patient requested Delivery   Delivery date: 08/04/23   Verified address: [address] [facility])   Medication will be filled on 08/03/23.

## 2023-07-25 NOTE — Therapy (Signed)
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   Hypertension    IBS (irritable bowel syndrome) 08/01/2016   Joint pain    Lactose intolerance    Migraine    Murmur 02/03/2011   Obese    Ovarian failure    Palpitation    Pulmonary nodule, left 2009   last CT Nov 2011-- due 08/2011   Rheumatoid arthritis(714.0)    in remission , off meds as of Summer 2012   Vitamin D deficiency    Past Surgical History:  Procedure Laterality Date   CHOLECYSTECTOMY  1999   COLONOSCOPY  10/31/11   EXPLORATORY LAPAROTOMY     TONSILLECTOMY  1969   WISDOM TOOTH EXTRACTION     Patient Active Problem List   Diagnosis Date Noted   Mood disorder (HCC)- Emotional Eating 08/02/2022   Class 1 obesity with serious comorbidity and body mass index (BMI) of 30.0 to 30.9 in adult 08/02/2022   At risk for impaired metabolic function 05/30/2022   Perioral dermatitis 05/26/2022   Preventative health care 05/26/2022   Essential hypertension 05/26/2022   Abnormal EKG 05/26/2022   Prediabetes 03/09/2022   Vitamin B12 deficiency 03/09/2022   Primary hypertension 02/22/2022   Hyperlipidemia 02/22/2022   GAD (generalized anxiety disorder) 02/22/2022   Vitamin D deficiency 02/22/2022   Ear pain, left 08/13/2021   History of anxiety 03/27/2017   History of hypertension 03/27/2017   History of bundle branch block 03/27/2017   Primary osteoarthritis  of both hands 12/22/2016   History of shingles 08/02/2016   High risk medication use 08/02/2016   Hypertension 08/01/2016   IBS (irritable bowel syndrome) 08/01/2016   Anxiety 08/01/2016   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 I have RA, I have had it for 35 years but I have been out of control of it for 9 months. I have been trying new medicines. I am afraid I am losing ROM in my shoulders.   Hand dominance: Right  PERTINENT HISTORY: RIAN MCMILLON is a 62 y.o. female with history of seropositive rheumatoid arthritis and osteoarthritis.  She is currently prescribed Xeljanz 5mg  liquid p.o. twice daily and sulfasalazine 500 mg 2 tablets by mouth twice daily. She has not noticed any improvement in her symptoms since adding on sulfasalazine after her last office visit. She has reduced Xeljanz to once daily and sulfasalazine to once daily which has alleviated her side effects.  She continues to have persistent pain and inflammation involving both shoulders and both hands.  Patient requested a referral to physical therapy for her shoulder pain which has been placed.  She continues to have significant fatigue and does not feel well  which she attributes to the ongoing inflammation she has been experiencing.  PAIN:  Are you having pain? No  PRECAUTIONS: None  RED FLAGS: None   WEIGHT BEARING RESTRICTIONS: No  FALLS:  Has patient fallen in last 6 months? 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  DIAGNOSTIC FINDINGS:  Only one for shoulder was 2 yrs ago for R shoulder--No glenohumeral joint space narrowing was noted.  Acromioclavicular narrowing was noted.  No chondrocalcinosis was noted.   Impression: These findings are consistent with acromioclavicular arthritis.    COGNITION: Overall cognitive status: Within functional limits for tasks assessed     SENSATION: WFL  POSTURE: slightly rounded shoulders  UPPER EXTREMITY ROM: grossly WFL    UPPER EXTREMITY MMT: all 5/5    SHOULDER SPECIAL TESTS: Impingement tests: Hawkins/Kennedy impingement test: positive  and Painful arc test: negative  Rotator cuff assessment: Empty can test: negative   JOINT MOBILITY TESTING:  Full active and passive ROM   PALPATION:  No TTP    TODAY'S TREATMENT:                                                                                                                                         DATE: EVAL 07/26/23   PATIENT EDUCATION: Education details: HEP Person educated: Patient Education method: Explanation Education comprehension: verbalized understanding  HOME EXERCISE PROGRAM: Access Code: [email or web address] URL: [email or web address] Date: 07/26/2023 Prepared by: Cassie Freer  Exercises - Shoulder External Rotation and Scapular Retraction with Resistance  - 1 x daily - 7 x weekly - 2 sets - 10 reps - Standing Shoulder Horizontal  Abduction with Resistance  -  1 x daily - 7 x weekly - 2 sets - 10 reps - Standing Shoulder Row with Anchored Resistance  - 1 x daily - 7 x weekly - 2 sets - 10 reps - Shoulder extension with resistance - Neutral  - 1 x daily - 7 x weekly - 2 sets - 10 reps - Doorway Pec Stretch at 90 Degrees Abduction  - 1 x daily - 7 x weekly - 2 reps - 30 hold - Corner Pec Major Stretch  - 1 x daily - 7 x weekly - 2 reps - 30 hold - Doorway Pec Stretch at 60 Elevation  - 1 x daily - 7 x weekly - 2 reps - 30 hold  ASSESSMENT:  CLINICAL IMPRESSION: Patient is a 62 y.o. female who was seen today for physical therapy evaluation and treatment for bilateral shoulder pain. She most likely experienced a RA flare up but it has since calmed down since starting her new medication. Patient reports her pain has subsided for now but she would like some exercises to work on. She presents with good overall motion and strength. Has some pain is end range movements and at [facility] joints. Her X-rays from 2 years ago show some AC joint arthritis and narrowing. Was given some HEP to work mostly on postural and shoulder strengthening.  PT discussed this w/ pt and she was receptive. Pt encouraged to call back with any specific changes or development of limitations during functional activities, as well as to continue follow-up with physician, as needed.?    OBJECTIVE IMPAIRMENTS: pain.   REHAB POTENTIAL: Good  CLINICAL DECISION MAKING: Stable/uncomplicated  EVALUATION COMPLEXITY: Low   GOALS: Goals reviewed with patient? Yes  SHORT TERM GOALS: Target date: 07/26/23  Pt will verbalize understanding of role/purpose of physical therapy services and be able to independently identify if/when she may need to seek an add'l referral for services to assist w/ participation in functional activities Goal status: MET   PLAN:  PT FREQUENCY: one time visit  PT DURATION: other: one time visit  PLANNED INTERVENTIONS: Skilled PT services  are not warranted at this time; pt encouraged to call back with any changes in functional status or limitations.    [place], PT 07/26/2023, 1:51 PM

## 2023-07-26 ENCOUNTER — Ambulatory Visit: Payer: Commercial Managed Care - PPO | Attending: Physician Assistant

## 2023-07-26 DIAGNOSIS — M069 Rheumatoid arthritis, unspecified: Secondary | ICD-10-CM | POA: Insufficient documentation

## 2023-07-26 DIAGNOSIS — M25512 Pain in left shoulder: Secondary | ICD-10-CM | POA: Diagnosis not present

## 2023-07-26 DIAGNOSIS — G8929 Other chronic pain: Secondary | ICD-10-CM | POA: Diagnosis not present

## 2023-07-26 DIAGNOSIS — M25511 Pain in right shoulder: Secondary | ICD-10-CM | POA: Diagnosis not present

## 2023-07-28 ENCOUNTER — Telehealth: Payer: Self-pay | Admitting: Rheumatology

## 2023-07-28 DIAGNOSIS — Z79899 Other long term (current) drug therapy: Secondary | ICD-10-CM

## 2023-07-28 NOTE — Telephone Encounter (Signed)
Lab Orders released.  

## 2023-07-28 NOTE — Telephone Encounter (Signed)
Patient contacted the office requesting lab orders to be be release to [facility].   Patient plans to have labs on Friday 07/28/23 .

## 2023-07-29 LAB — CBC WITH DIFFERENTIAL/PLATELET
Basophils Absolute: 0 10*3/uL (ref 0.0–0.2)
Basos: 1 %
EOS (ABSOLUTE): 0.1 10*3/uL (ref 0.0–0.4)
Eos: 1 %
Hematocrit: 38.6 % (ref 34.0–46.6)
Hemoglobin: 12.7 g/dL (ref 11.1–15.9)
Immature Grans (Abs): 0 10*3/uL (ref 0.0–0.1)
Immature Granulocytes: 0 %
Lymphocytes Absolute: 3.2 10*3/uL — ABNORMAL HIGH (ref 0.7–3.1)
Lymphs: 40 %
MCH: 31.4 pg (ref 26.6–33.0)
MCHC: 32.9 g/dL (ref 31.5–35.7)
MCV: 95 fL (ref 79–97)
Monocytes Absolute: 0.6 10*3/uL (ref 0.1–0.9)
Monocytes: 8 %
Neutrophils Absolute: 3.9 10*3/uL (ref 1.4–7.0)
Neutrophils: 50 %
Platelets: 263 10*3/uL (ref 150–450)
RBC: 4.05 x10E6/uL (ref 3.77–5.28)
RDW: 12.6 % (ref 11.7–15.4)
WBC: 7.9 10*3/uL (ref 3.4–10.8)

## 2023-07-29 LAB — CMP14+EGFR
ALT: 34 [IU]/L — ABNORMAL HIGH (ref 0–32)
AST: 19 [IU]/L (ref 0–40)
Albumin: 4.7 g/dL (ref 3.9–4.9)
Alkaline Phosphatase: 78 [IU]/L (ref 44–121)
BUN/Creatinine Ratio: 17 (ref 12–28)
BUN: 12 mg/dL (ref 8–27)
Bilirubin Total: 0.4 mg/dL (ref 0.0–1.2)
CO2: 24 mmol/L (ref 20–29)
Calcium: 9.5 mg/dL (ref 8.7–10.3)
Chloride: 100 mmol/L (ref 96–106)
Creatinine, Ser: 0.72 mg/dL (ref 0.57–1.00)
Globulin, Total: 2.6 g/dL (ref 1.5–4.5)
Glucose: 101 mg/dL — ABNORMAL HIGH (ref 70–99)
Potassium: 3.9 mmol/L (ref 3.5–5.2)
Sodium: 138 mmol/L (ref 134–144)
Total Protein: 7.3 g/dL (ref 6.0–8.5)
eGFR: 94 mL/min/{1.73_m2} (ref >59)

## 2023-07-30 NOTE — Progress Notes (Signed)
CBC and CMP are stable.  Liver function is mildly elevated.  If patient is on leflunomide 10 mg p.o. daily she should stay on the same dose and not to increase to 20 mg a day.  She should avoid all NSAIDs.  Please repeat LFTs (AST and ALT) in 1 month.

## 2023-07-31 ENCOUNTER — Encounter: Payer: Self-pay | Admitting: Family Medicine

## 2023-07-31 ENCOUNTER — Ambulatory Visit (INDEPENDENT_AMBULATORY_CARE_PROVIDER_SITE_OTHER): Payer: Commercial Managed Care - PPO | Admitting: Family Medicine

## 2023-07-31 ENCOUNTER — Telehealth: Payer: Self-pay | Admitting: *Deleted

## 2023-07-31 VITALS — BP 120/80 | HR 62 | Temp 97.9°F | Resp 18 | Ht 67.0 in | Wt 201.4 lb

## 2023-07-31 DIAGNOSIS — M0579 Rheumatoid arthritis with rheumatoid factor of multiple sites without organ or systems involvement: Secondary | ICD-10-CM

## 2023-07-31 DIAGNOSIS — I10 Essential (primary) hypertension: Secondary | ICD-10-CM | POA: Diagnosis not present

## 2023-07-31 DIAGNOSIS — E785 Hyperlipidemia, unspecified: Secondary | ICD-10-CM | POA: Diagnosis not present

## 2023-07-31 DIAGNOSIS — Z79899 Other long term (current) drug therapy: Secondary | ICD-10-CM

## 2023-07-31 NOTE — Assessment & Plan Note (Signed)
Well controlled, no changes to meds. Encouraged heart healthy diet such as the DASH diet and exercise as tolerated.  °

## 2023-07-31 NOTE — Progress Notes (Signed)
Established Patient Office Visit  Subjective   Patient ID: Sharon Hunter, female    DOB: 28-Jul-1961  Age: 62 y.o. MRN: [id number]  Chief Complaint  Patient presents with   Hypertension   Follow-up    HPI Discussed the use of AI scribe software for clinical note transcription with the patient, who gave verbal consent to proceed.  History of Present Illness   The patient, with a history of rheumatoid arthritis (RA) and anxiety, presents with concerns about weight gain and a desire to taper off Zoloft. She reports that the medication is effective for her anxiety but believes it has contributed to her weight gain. She has been managing her RA with Arava and Harriette Ohara, which she reports have finally brought her symptoms under control. She also mentions a slight elevation in her liver function tests, likely due to her RA medications.  The patient also expresses concerns about her risk for cancer, given her extensive family history of various types of cancer. She is considering undergoing genetic testing to assess her risk. She also mentions a history of lung nodules, which have been stable since 2013.  In addition to her own health, the patient discusses her mother's declining health and recent family bereavement. Her mother, aged 87, is showing signs of cognitive decline, which the patient attributes to stress.      Patient Active Problem List   Diagnosis Date Noted   Mood disorder (HCC)- Emotional Eating 08/02/2022   Class 1 obesity with serious comorbidity and body mass index (BMI) of 30.0 to 30.9 in adult 08/02/2022   At risk for impaired metabolic function 05/30/2022   Perioral dermatitis 05/26/2022   Preventative health care 05/26/2022   Essential hypertension 05/26/2022   Abnormal EKG 05/26/2022   Prediabetes 03/09/2022   Vitamin B12 deficiency 03/09/2022   Primary hypertension 02/22/2022   Hyperlipidemia 02/22/2022   GAD (generalized anxiety disorder) 02/22/2022   Vitamin D  deficiency 02/22/2022   Ear pain, left 08/13/2021   History of anxiety 03/27/2017   History of hypertension 03/27/2017   History of bundle branch block 03/27/2017   Primary osteoarthritis of both hands 12/22/2016   History of shingles 08/02/2016   High risk medication use 08/02/2016   Hypertension 08/01/2016   IBS (irritable bowel syndrome) 08/01/2016   Anxiety 08/01/2016   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   LBBB (left bundle branch block) 01/30/2015   Palpitations 01/30/2015   Pulmonary nodule 07/04/2011   Murmur 02/03/2011   Rheumatoid arthritis (HCC) 02/03/2011   Hair loss 02/03/2011   Fatigue 02/03/2011   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   Hypertension    IBS (irritable bowel syndrome) 08/01/2016   Joint pain    Lactose intolerance    Migraine    Murmur 02/03/2011   Obese    Ovarian failure    Palpitation    Pulmonary nodule, left 2009   last CT Nov 2011-- due 08/2011   Rheumatoid arthritis(714.0)    in remission , off meds as of Summer 2012   Vitamin D deficiency    Past Surgical History:  Procedure Laterality Date   CHOLECYSTECTOMY  1999   COLONOSCOPY  10/31/11   EXPLORATORY LAPAROTOMY     TONSILLECTOMY  1969   WISDOM TOOTH EXTRACTION     Social History   Tobacco Use   Smoking status: Former  Current packs/day: 0.00    Types: Cigarettes    Quit date: 10/03/1990    Years since quitting: 32.8    Passive exposure: Past   Smokeless tobacco: Never  Vaping Use   Vaping status: Never Used  Substance Use Topics   Alcohol use: Yes    Alcohol/week: 3.0 standard drinks of alcohol    Types: 3 Standard drinks or equivalent per week    Comment: Red wine 4 oz occ   Drug use: No   Social History   Socioeconomic History   Marital status: Single    Spouse name: Not on file   Number of children: 1   Years of education: 17   Highest  education level: Not on file  Occupational History   Occupation: RN  Tobacco Use   Smoking status: Former    Current packs/day: 0.00    Types: Cigarettes    Quit date: 10/03/1990    Years since quitting: 32.8    Passive exposure: Past   Smokeless tobacco: Never  Vaping Use   Vaping status: Never Used  Substance and Sexual Activity   Alcohol use: Yes    Alcohol/week: 3.0 standard drinks of alcohol    Types: 3 Standard drinks or equivalent per week    Comment: Red wine 4 oz occ   Drug use: No   Sexual activity: Not Currently    Partners: Male  Other Topics Concern   Not on file  Social History Narrative   Exercise-- jogs   Social Determinants of Health   Financial Resource Strain: Not on file  Food Insecurity: Not on file  Transportation Needs: Not on file  Physical Activity: Not on file  Stress: Not on file  Social Connections: Not on file  Intimate Partner Violence: Not on file   Family Status  Relation Name Status   Mother  Alive   Father  Deceased   Sister  Deceased at age 37   Brother  Alive   Brother  Deceased at age 77       lung cancer   Mat Aunt  Alive  No partnership data on file   Family History  Problem Relation Age of Onset   Cancer Mother    Arthritis Mother    Uterine cancer Mother    Hypertension Mother    Diabetes Mother    Alcohol abuse Father    Hyperlipidemia Father    Heart disease Father    High blood pressure Father    Sleep apnea Father    Uterine cancer Sister    Heart disease Sister        cardiac stent   Diabetes Sister    Hyperlipidemia Sister    Hypertension Sister    Stroke Sister    Alcohol abuse Brother    Diabetes Brother    Hypertension Brother    Cancer Brother        lung   Cancer Maternal Aunt        lung----non smoker   Allergies  Allergen Reactions   Other Swelling    Contrast dye- head swelling per pt.   Contrast Media [Iodinated Contrast Media]       Review of Systems  Constitutional:  Negative for  fever and malaise/fatigue.  HENT:  Negative for congestion.   Eyes:  Negative for blurred vision.  Respiratory:  Negative for cough and shortness of breath.   Cardiovascular:  Negative for chest pain, palpitations and leg swelling.  Gastrointestinal:  Negative for abdominal  pain, blood in stool, nausea and vomiting.  Genitourinary:  Negative for dysuria and frequency.  Musculoskeletal:  Negative for back pain and falls.  Skin:  Negative for rash.  Neurological:  Negative for dizziness, loss of consciousness and headaches.  Endo/Heme/Allergies:  Negative for environmental allergies.  Psychiatric/Behavioral:  Negative for depression. The patient is not nervous/anxious.       Objective:     BP 120/80 (BP Location: Right Arm, Patient Position: Sitting, Cuff Size: Large)   Pulse 62   Temp 97.9 F (36.6 C) (Oral)   Resp 18   Ht 5\' 7"  (1.702 m)   Wt 201 lb 6.4 oz (91.4 kg)   LMP 10/03/2005   SpO2 97%   BMI 31.54 kg/m  BP Readings from Last 3 Encounters:  07/31/23 120/80  07/12/23 131/82  05/19/23 124/78   Wt Readings from Last 3 Encounters:  07/31/23 201 lb 6.4 oz (91.4 kg)  07/12/23 193 lb (87.5 kg)  05/19/23 193 lb (87.5 kg)   SpO2 Readings from Last 3 Encounters:  07/31/23 97%  05/19/23 98%  02/07/23 95%      Physical Exam Vitals and nursing note reviewed.  Constitutional:      General: She is not in acute distress.    Appearance: Normal appearance. She is well-developed.  HENT:     Head: Normocephalic and atraumatic.  Eyes:     General: No scleral icterus.       Right eye: No discharge.        Left eye: No discharge.  Cardiovascular:     Rate and Rhythm: Normal rate and regular rhythm.     Heart sounds: No murmur heard. Pulmonary:     Effort: Pulmonary effort is normal. No respiratory distress.     Breath sounds: Normal breath sounds.  Musculoskeletal:        General: Normal range of motion.     Cervical back: Normal range of motion and neck supple.      Right lower leg: No edema.     Left lower leg: No edema.  Skin:    General: Skin is warm and dry.  Neurological:     Mental Status: She is alert and oriented to person, place, and time.  Psychiatric:        Mood and Affect: Mood normal.        Behavior: Behavior normal.        Thought Content: Thought content normal.        Judgment: Judgment normal.      No results found for any visits on 07/31/23.  Last CBC Lab Results  Component Value Date   WBC 7.9 07/28/2023   HGB 12.7 07/28/2023   HCT 38.6 07/28/2023   MCV 95 07/28/2023   MCH 31.4 07/28/2023   RDW 12.6 07/28/2023   PLT 263 07/28/2023   Last metabolic panel Lab Results  Component Value Date   GLUCOSE 101 (H) 07/28/2023   NA 138 07/28/2023   K 3.9 07/28/2023   CL 100 07/28/2023   CO2 24 07/28/2023   BUN 12 07/28/2023   CREATININE 0.72 07/28/2023   EGFR 94 07/28/2023   CALCIUM 9.5 07/28/2023   PROT 7.3 07/28/2023   ALBUMIN 4.7 07/28/2023   LABGLOB 2.6 07/28/2023   AGRATIO 2.0 02/22/2022   BILITOT 0.4 07/28/2023   ALKPHOS 78 07/28/2023   AST 19 07/28/2023   ALT 34 (H) 07/28/2023   Last lipids Lab Results  Component Value Date   CHOL 184 05/11/2023  HDL 91 05/11/2023   LDLCALC 72 05/11/2023   LDLDIRECT 132.6 05/23/2013   TRIG 133 05/11/2023   CHOLHDL 2.0 05/11/2023   Last hemoglobin A1c Lab Results  Component Value Date   HGBA1C 5.9 (H) 11/28/2022   Last thyroid functions Lab Results  Component Value Date   TSH 0.753 02/22/2022   Last vitamin D Lab Results  Component Value Date   VD25OH 30.1 11/28/2022   Last vitamin B12 and Folate Lab Results  Component Value Date   VITAMINB12 1,042 05/30/2022      The 10-year ASCVD risk score (Arnett DK, et al., 2019) is: 3.4%    Assessment & Plan:  Assessment and Plan    Anxiety Managed with Zoloft 25mg , but patient wishes to taper off due to weight gain concerns. -Taper Zoloft by taking half dose for a couple of weeks, then discontinue.  Patient can resume full dose if uncomfortable with the change.  Rheumatoid Arthritis Managed with Ranae Plumber and Harriette Ohara. Recent labs show slightly elevated liver function tests (LFTs). -Continue current medication regimen. -Avoid NSAIDs. -Repeat LFTs in one month.  Cancer Risk Patient has a strong family history of various cancers and is concerned about her own risk. -Schedule genetic cancer screening through [facility] program.  Weight Management Patient has gained weight, possibly due to Zoloft and wishes to lose weight. -Encourage healthy diet and regular exercise.  General Health Maintenance -Continue regular screenings (Pap smears, colonoscopies, mammograms) as recommended by respective specialists. -Ensure cholesterol levels are monitored regularly. -Continue monitoring blood pressure.       Problem List Items Addressed This Visit       Unprioritized   Rheumatoid arthritis (HCC) - Primary    Per rheumatology      Hypertension    Well controlled, no changes to meds. Encouraged heart healthy diet such as the DASH diet and exercise as tolerated.        Hyperlipidemia    Encourage heart healthy diet such as MIND or DASH diet, increase exercise, avoid trans fats, simple carbohydrates and processed foods, consider a krill or fish or flaxseed oil cap daily.         Return in about 6 months (around 01/29/2024).    Donato Schultz, DO

## 2023-07-31 NOTE — Assessment & Plan Note (Signed)
Encourage heart healthy diet such as MIND or DASH diet, increase exercise, avoid trans fats, simple carbohydrates and processed foods, consider a krill or fish or flaxseed oil cap daily.  °

## 2023-07-31 NOTE — Assessment & Plan Note (Signed)
Per rheumatology 

## 2023-07-31 NOTE — Telephone Encounter (Signed)
-----   Message from [facility] sent at 07/30/2023  8:57 AM EDT ----- CBC and CMP are stable.  Liver function is mildly elevated.  If patient is on leflunomide 10 mg p.o. daily she should stay on the same dose and not to increase to 20 mg a day.  She should avoid all NSAIDs.  Please repeat LFTs (AST and ALT) in 1 month .

## 2023-08-02 ENCOUNTER — Other Ambulatory Visit (HOSPITAL_COMMUNITY): Payer: Self-pay

## 2023-08-02 ENCOUNTER — Other Ambulatory Visit: Payer: Self-pay | Admitting: Family Medicine

## 2023-08-02 MED ORDER — SERTRALINE HCL 50 MG PO TABS
50.0000 mg | ORAL_TABLET | Freq: Every day | ORAL | 2 refills | Status: DC
  Filled 2023-08-02: qty 30, 30d supply, fill #0

## 2023-08-03 ENCOUNTER — Other Ambulatory Visit (HOSPITAL_COMMUNITY): Payer: Self-pay

## 2023-08-03 ENCOUNTER — Other Ambulatory Visit: Payer: Self-pay

## 2023-08-04 DIAGNOSIS — B029 Zoster without complications: Secondary | ICD-10-CM

## 2023-08-04 HISTORY — DX: Zoster without complications: B02.9

## 2023-08-05 ENCOUNTER — Other Ambulatory Visit: Payer: Self-pay | Admitting: Physician Assistant

## 2023-08-07 ENCOUNTER — Other Ambulatory Visit: Payer: Self-pay

## 2023-08-07 ENCOUNTER — Other Ambulatory Visit (HOSPITAL_COMMUNITY): Payer: Self-pay

## 2023-08-07 MED ORDER — LEFLUNOMIDE 10 MG PO TABS
10.0000 mg | ORAL_TABLET | Freq: Every day | ORAL | 0 refills | Status: DC
  Filled 2023-08-07: qty 30, 30d supply, fill #0

## 2023-08-07 NOTE — Progress Notes (Signed)
Pt called and LVM asking about Xeljanz delivery. Called pt back to confirm medication will be delivered today, 08/07/23.

## 2023-08-07 NOTE — Telephone Encounter (Signed)
Last Fill: 07/12/2023  Labs: 07/28/2023 CBC and CMP are stable.  Liver function is mildly elevated.  If patient is on leflunomide 10 mg p.o. daily she should stay on the same dose and not to increase to 20 mg a day.  She should avoid all NSAIDs.  Please repeat LFTs (AST and ALT) in 1 month.   Next Visit: 08/25/2023  Last Visit: 07/12/2023    DX: Rheumatoid arthritis involving multiple sites with positive rheumatoid factor   Current Dose per office note 07/12/2023: 07/28/2023 lab results Liver function is mildly elevated.  If patient is on leflunomide 10 mg p.o. daily she should stay on the same dose and not to increase to 20 mg a day.  She should avoid all NSAIDs.  Please repeat LFTs (AST and ALT) in 1 month.   Okay to refill Arava ?

## 2023-08-11 ENCOUNTER — Encounter: Payer: Self-pay | Admitting: Family Medicine

## 2023-08-11 ENCOUNTER — Ambulatory Visit: Payer: Commercial Managed Care - PPO | Admitting: Family Medicine

## 2023-08-11 ENCOUNTER — Other Ambulatory Visit (HOSPITAL_BASED_OUTPATIENT_CLINIC_OR_DEPARTMENT_OTHER): Payer: Self-pay

## 2023-08-11 VITALS — BP 118/78 | HR 65 | Temp 98.2°F | Resp 18 | Ht 67.0 in

## 2023-08-11 DIAGNOSIS — F39 Unspecified mood [affective] disorder: Secondary | ICD-10-CM | POA: Diagnosis not present

## 2023-08-11 DIAGNOSIS — H6690 Otitis media, unspecified, unspecified ear: Secondary | ICD-10-CM | POA: Diagnosis not present

## 2023-08-11 MED ORDER — SERTRALINE HCL 50 MG PO TABS
50.0000 mg | ORAL_TABLET | Freq: Every day | ORAL | 1 refills | Status: DC
  Filled 2023-08-11 – 2023-08-31 (×2): qty 90, 90d supply, fill #0
  Filled 2023-12-07: qty 90, 90d supply, fill #1

## 2023-08-11 MED ORDER — OFLOXACIN 0.3 % OT SOLN
10.0000 [drp] | Freq: Every day | OTIC | 1 refills | Status: DC
  Filled 2023-08-11: qty 5, 10d supply, fill #0

## 2023-08-11 MED ORDER — CEFDINIR 300 MG PO CAPS
300.0000 mg | ORAL_CAPSULE | Freq: Two times a day (BID) | ORAL | 0 refills | Status: DC
  Filled 2023-08-11: qty 20, 10d supply, fill #0

## 2023-08-11 NOTE — Assessment & Plan Note (Signed)
Omnicef for 7 dayys  Floxin otitc Con't nasonex  Pt is on prednisone for RA F/u as needed

## 2023-08-11 NOTE — Progress Notes (Signed)
Office Visit Note  Patient: Sharon Hunter             Date of Birth: [birth year]           MRN: [id number]             PCP: Donato Schultz, DO Referring: Donato Schultz, * Visit Date: 08/25/2023 Occupation: @GUAROCC @  Subjective:  Discuss medications  History of Present Illness: Sharon Hunter is a 62 y.o. female with history of seropositive rheumatoid arthritis.  Patient reports that she has been holding both [place] and [place] for the past 2 weeks while recovering from an outbreak of shingles on the left side of her face.  Patient states that the lesions within her mouth healed quickly and she has been able to return to work this week.  Patient states that she prescribed a prednisone taper by her PCP which she completed yesterday.  She states that while taking prednisone she has not had any signs or symptoms of a rheumatoid arthritis flare.  She has not been experiencing any joint swelling and states that this is the best her joints have felt in many months.  Patient did not find Xeljanz to be efficacious and would like to discontinue.  Patient states that her [place] did seem to improve her fatigue so she is willing to resume Arava with close lab monitoring.  Patient would like to discuss initiating long-term prednisone instead of going back on a biologic at this time.  She is concerned about the cancer risks of being on a biologic as well as the risk for immunosuppression.       Activities of Daily Living:  Patient reports morning stiffness for 0 minutes.   Patient Denies nocturnal pain.  Difficulty dressing/grooming: Denies Difficulty climbing stairs: Denies Difficulty getting out of chair: Denies Difficulty using hands for taps, buttons, cutlery, and/or writing: Denies  Review of Systems  Constitutional:  Negative for fatigue.  HENT:  Positive for mouth dryness. Negative for mouth sores.   Eyes:  Positive for dryness. Negative for pain and visual disturbance.   Respiratory:  Negative for shortness of breath.   Cardiovascular:  Negative for chest pain and palpitations.  Gastrointestinal:  Negative for blood in stool, constipation and diarrhea.  Endocrine: Negative for increased urination.  Genitourinary:  Negative for involuntary urination.  Musculoskeletal:  Negative for joint pain, gait problem, joint pain, joint swelling, myalgias, muscle weakness, morning stiffness, muscle tenderness and myalgias.  Skin:  Positive for rash and hair loss. Negative for color change and sensitivity to sunlight.  Allergic/Immunologic: Negative for susceptible to infections.  Neurological:  Negative for dizziness and headaches.  Hematological:  Negative for swollen glands.  Psychiatric/Behavioral:  Positive for sleep disturbance. Negative for depressed mood. The patient is nervous/anxious.     PMFS History:  Patient Active Problem List   Diagnosis Date Noted   Acute otitis media 08/11/2023   Mood disorder (HCC)- Emotional Eating 08/02/2022   Class 1 obesity with serious comorbidity and body mass index (BMI) of 30.0 to 30.9 in adult 08/02/2022   At risk for impaired metabolic function 05/30/2022   Perioral dermatitis 05/26/2022   Preventative health care 05/26/2022   Essential hypertension 05/26/2022   Abnormal EKG 05/26/2022   Prediabetes 03/09/2022   Vitamin B12 deficiency 03/09/2022   Primary hypertension 02/22/2022   Hyperlipidemia 02/22/2022   GAD (generalized anxiety disorder) 02/22/2022   Vitamin D deficiency 02/22/2022   Ear pain, left 08/13/2021   History  of anxiety 03/27/2017   History of hypertension 03/27/2017   History of bundle branch block 03/27/2017   Primary osteoarthritis of both hands 12/22/2016   History of shingles 08/02/2016   High risk medication use 08/02/2016   Hypertension 08/01/2016   IBS (irritable bowel syndrome) 08/01/2016   Anxiety 08/01/2016   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   LBBB (left bundle branch  block) 01/30/2015   Palpitations 01/30/2015   Pulmonary nodule 07/04/2011   Murmur 02/03/2011   Rheumatoid arthritis (HCC) 02/03/2011   Hair loss 02/03/2011   Fatigue 02/03/2011    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   Hypertension    IBS (irritable bowel syndrome) 08/01/2016   Joint pain    Lactose intolerance    Migraine    Murmur 02/03/2011   Obese    Ovarian failure    Palpitation    Pulmonary nodule, left 2009   last CT Nov 2011-- due 08/2011   Rheumatoid arthritis(714.0)    in remission , off meds as of Summer 2012   Shingles 08/2023   Vitamin D deficiency     Family History  Problem Relation Age of Onset   Cancer Mother    Arthritis Mother    Uterine cancer Mother    Hypertension Mother    Diabetes Mother    Esophageal cancer Mother    Alcohol abuse Father    Hyperlipidemia Father    Heart disease Father    High blood pressure Father    Sleep apnea Father    Uterine cancer Sister    Heart disease Sister        cardiac stent   Diabetes Sister    Hyperlipidemia Sister    Hypertension Sister    Stroke Sister    Alcohol abuse Brother    Diabetes Brother    Hypertension Brother    Cancer Brother        lung   Cancer Maternal Aunt        lung----non smoker   Past Surgical History:  Procedure Laterality Date   CHOLECYSTECTOMY  1999   COLONOSCOPY  10/31/11   EXPLORATORY LAPAROTOMY     TONSILLECTOMY  1969   WISDOM TOOTH EXTRACTION     Social History   Social History Narrative   Exercise-- jogs   Immunization History  Administered Date(s) Administered   Influenza Split 07/03/2013   Influenza,inj,Quad PF,6+ Mos 07/01/2023   Influenza,inj,Quad PF,6-35 Mos 07/17/2020   Influenza-Unspecified 07/03/2014, 07/04/2015   PFIZER(Purple Top)SARS-COV-2 Vaccination 01/13/2020, 05/15/2020   Tdap 12/01/2009     Objective: Vital Signs: BP 130/84  (BP Location: Left Arm, Patient Position: Sitting, Cuff Size: Normal)   Pulse (!) 56   Resp 16   Ht 5\' 7"  (1.702 m)   Wt 202 lb (91.6 kg)   LMP 10/03/2005   BMI 31.64 kg/m    Physical Exam Vitals and nursing note reviewed.  Constitutional:      Appearance: She is well-developed.  HENT:     Head: Normocephalic and atraumatic.  Eyes:     Conjunctiva/sclera: Conjunctivae normal.  Cardiovascular:     Rate and Rhythm: Normal rate and regular rhythm.     Heart sounds: Normal heart sounds.  Pulmonary:     Effort: Pulmonary effort is normal.     Breath sounds: Normal breath sounds.  Abdominal:     General:  Bowel sounds are normal.     Palpations: Abdomen is soft.  Musculoskeletal:     Cervical back: Normal range of motion.  Lymphadenopathy:     Cervical: No cervical adenopathy.  Skin:    General: Skin is warm and dry.     Capillary Refill: Capillary refill takes less than 2 seconds.  Neurological:     Mental Status: She is alert and oriented to person, place, and time.  Psychiatric:        Behavior: Behavior normal.      Musculoskeletal Exam: C-spine, thoracic spine, lumbar spine have good range of motion.  Shoulder joints, elbow joints, wrist joints, MCPs, PIPs, DIPs of range of motion with no synovitis.  Complete fist formation bilaterally.  Synovial thickening of bilateral second and third MCP joints.  Hip joints have good range of motion with no groin pain.  Knee joints have good range of motion no warmth or effusion.  Ankle joints have good range of motion with no synovitis.  CDAI Exam: CDAI Score: -- Patient Global: --; Provider Global: -- Swollen: --; Tender: -- Joint Exam 08/25/2023   No joint exam has been documented for this visit   There is currently no information documented on the homunculus. Go to the Rheumatology activity and complete the homunculus joint exam.  Investigation: No additional findings.  Imaging: No results found.  Recent Labs: Lab  Results  Component Value Date   WBC 7.9 07/28/2023   HGB 12.7 07/28/2023   PLT 263 07/28/2023   NA 138 07/28/2023   K 3.9 07/28/2023   CL 100 07/28/2023   CO2 24 07/28/2023   GLUCOSE 101 (H) 07/28/2023   BUN 12 07/28/2023   CREATININE 0.72 07/28/2023   BILITOT 0.4 07/28/2023   ALKPHOS 78 07/28/2023   AST 19 07/28/2023   ALT 34 (H) 07/28/2023   PROT 7.3 07/28/2023   ALBUMIN 4.7 07/28/2023   CALCIUM 9.5 07/28/2023   GFRAA 86 01/07/2021   QFTBGOLDPLUS NEGATIVE 09/12/2018    Speciality Comments: TB Gold: 07/25/2022 Neg  Prior therapy: methotrexate, remicade, orencia, and enbrel (inadequate response), Simponi stopped Dec 2023, Actemra started 10/17/22-June 2024; Harriette Ohara liq started 03/27/23  Procedures:  No procedures performed Allergies: Other and Contrast media [iodinated contrast media]   Assessment / Plan:     Visit Diagnoses: Rheumatoid arthritis involving multiple sites with positive rheumatoid factor (HCC): She has no synovitis on examination today.  Patient completed a course of prednisone yesterday and is currently asymptomatic.  She has been holding both [place] for the past 2 weeks while recovering from a shingles outbreak.  Patient does not plan to resume Harriette Ohara due to inadequate response and recurrence of shingles.  Different treatment options were discussed today in detail.  The patient would like to discuss remaining on long-term prednisone.  Discussed the risks of long-term prednisone use especially given her history of osteoporosis--currently on Evenity. She is apprehensive to reinitiate a biologic due to the increased risk for cancer as well as immunosuppression.  She is agreeable to restart Arava since it seemed to help with her level of fatigue.  A prescription for prednisone 5 mg 1 tablet daily for 1 month will be sent to the pharmacy.  We would then try reducing prednisone to 2.5 mg x 1 month then discontinue.  She will notify us if she develops signs or  symptoms of a flare. We discussed reinitiating biologic infusion in the future but she would like to hold off at this time.  She will follow-up in the office in 2 months or sooner if needed.  High risk medication use -Patient plans on restarting Arava 10 mg 1 tablet by mouth daily.  If labs are stable we can try increasing Arava to 20 mg daily. Discontinued xeljanz due to shingles outbreak and inadequate response.  Patient has requested to initiate long-term prednisone.  Discussed the risks of long-term prednisone use today in detail.  She is apprehensive to restart a biologic due to the increased risk for cancer as well as immunosuppression.  Different treatment options were discussed today in detail.  Patient is more inclined to reinitiate Arava and low-dose prednisone.  A prescription for prednisone 5 mg 1 tablet daily will be sent to the pharmacy today.  She will take 5 mg of prednisone for 1 month and then will try tapering to 2.5 mg for 1 month then discontinue. TB gold negative on 07/28/2023. Previous therapy includes methotrexate, Remicade, Orencia, Enbrel, Simponi, actemra, sulfasalazine, xeljanz. Unable to swallow tablets whole. CBC and CMP updated on 07/28/23.  Orders for CBC and CMP were released today. Lipid panel updated on 05/11/23.  Plan: CBC with Differential/Platelet, COMPLETE METABOLIC PANEL WITH GFR  Chronic pain of both shoulders: She has good range of motion of both shoulder joints on examination today.  No tenderness or effusion noted.   Primary osteoarthritis of both hands: No tenderness or synovitis noted today.   Right ankle swelling: Resolved.   Other fatigue: Improved while taking Arava.   Sicca syndrome (HCC): Chronic, stable.   History of vitamin D deficiency: She is taking a daily vitamin D supplement.   Age-related osteoporosis without current pathological fracture - hx of long term prednisone use. DEXA 12/03/20: The BMD measured at [facility] L1-L2 is 0.851 g/cm2  with a T-score of -2.6. Currently on Evenity monthly injections. Discouraged long term use of prednisone.   Other medical conditions are listed as follows:  History of anxiety  History of hypertension: Blood pressure was 130/84 today in the office.  Pulmonary nodule  History of shingles: x3--most recent outbreak occurred while on [place].  Harriette Ohara has been discontinued.   History of bundle branch block  History of hyperlipidemia  Orders: Orders Placed This Encounter  Procedures   CBC with Differential/Platelet   COMPLETE METABOLIC PANEL WITH GFR   No orders of the defined types were placed in this encounter.    Follow-Up Instructions: Return in about 2 months (around 10/25/2023) for Rheumatoid arthritis.   Gearldine Bienenstock, PA-C  Note - This record has been created using Dragon software.  Chart creation errors have been sought, but may not always  have been located. Such creation errors do not reflect on  the standard of medical care.

## 2023-08-11 NOTE — Progress Notes (Signed)
Established Patient Office Visit  Subjective   Patient ID: Sharon Hunter, female    DOB: 03/28/1961  Age: 62 y.o. MRN: [id number]  Chief Complaint  Patient presents with   Ear Pain    X5 days, left ear. No trouble hearing.    HPI Discussed the use of AI scribe software for clinical note transcription with the patient, who gave verbal consent to proceed.  History of Present Illness   The patient presents with a chief complaint of uncontrolled rheumatoid arthritis (RA) flares affecting her hand and wrist. She also reports a new onset of severe jaw pain, described as a 'knitting needle' sensation, persisting for five days. The pain is not relieved by steroids, which she has been self-administering in response to the flares. The patient notes that Tylenol provides temporary relief, but the pain returns.  In addition to the RA flares and jaw pain, the patient reports a sensation of fluid and irritation in her left ear, which is significantly different from the right ear. She has been using Nasonex, which provides minimal relief. The patient is also on prednisone, which she tries to avoid taking regularly.  The patient has a history of heart disease and is on amlodipine for blood pressure control. She reports dryness, which she attributes to her medications. She also mentions a recent change in her sertraline prescription from a 30-day to a 90-day supply.  The patient is also dealing with significant personal stressors, including the recent death of a sibling and the declining health of her mother. She is considering moving her mother into her home or finding a care facility for her.     ory (Optional):[id number]}  Review of Systems  Constitutional:  Negative for chills, fever and malaise/fatigue.  HENT:  Negative for congestion and hearing loss.   Eyes:  Negative for blurred vision and discharge.  Respiratory:  Negative for cough, sputum production and shortness of breath.   Cardiovascular:   Negative for chest pain, palpitations and leg swelling.  Gastrointestinal:  Negative for abdominal pain, blood in stool, constipation, diarrhea, heartburn, nausea and vomiting.  Genitourinary:  Negative for dysuria, frequency, hematuria and urgency.  Musculoskeletal:  Negative for back pain, falls and myalgias.  Skin:  Negative for rash.  Neurological:  Negative for dizziness, sensory change, loss of consciousness, weakness and headaches.  Endo/Heme/Allergies:  Negative for environmental allergies. Does not bruise/bleed easily.  Psychiatric/Behavioral:  Negative for depression and suicidal ideas. The patient is not nervous/anxious and does not have insomnia.       Objective:     BP 118/78 (BP Location: Right Arm, Patient Position: Sitting, Cuff Size: Normal)   Pulse 65   Temp 98.2 F (36.8 C) (Oral)   Resp 18   Ht 5\' 7"  (1.702 m)   LMP 10/03/2005   SpO2 96%   BMI 31.54 kg/m  BP Readings from Last 3 Encounters:  08/11/23 118/78  07/31/23 120/80  07/12/23 131/82   Wt Readings from Last 3 Encounters:  07/31/23 201 lb 6.4 oz (91.4 kg)  07/12/23 193 lb (87.5 kg)  05/19/23 193 lb (87.5 kg)   SpO2 Readings from Last 3 Encounters:  08/11/23 96%  07/31/23 97%  05/19/23 98%      Physical Exam Vitals and nursing note reviewed.  Constitutional:      General: She is not in acute distress.    Appearance: Normal appearance. She is well-developed.  HENT:     Head: Normocephalic and atraumatic.     Right  Ear: Hearing, tympanic membrane, ear canal and external ear normal. There is no impacted cerumen.     Left Ear: Hearing, ear canal and external ear normal. A middle ear effusion is present. There is no impacted cerumen. Tympanic membrane is erythematous and bulging.     Nose: Nose normal.     Mouth/Throat:     Mouth: Mucous membranes are moist.     Pharynx: Oropharynx is clear. No oropharyngeal exudate or posterior oropharyngeal erythema.  Eyes:     General: No scleral icterus.        Right eye: No discharge.        Left eye: No discharge.     Conjunctiva/sclera: Conjunctivae normal.     Pupils: Pupils are equal, round, and reactive to light.  Neck:     Thyroid: No thyromegaly or thyroid tenderness.     Vascular: No JVD.  Cardiovascular:     Rate and Rhythm: Normal rate and regular rhythm.     Heart sounds: Normal heart sounds. No murmur heard. Pulmonary:     Effort: Pulmonary effort is normal. No respiratory distress.     Breath sounds: Normal breath sounds.  Abdominal:     General: Bowel sounds are normal. There is no distension.     Palpations: Abdomen is soft. There is no mass.     Tenderness: There is no abdominal tenderness. There is no guarding or rebound.  Genitourinary:    Vagina: Normal.  Musculoskeletal:        General: Normal range of motion.     Cervical back: Normal range of motion and neck supple.     Right lower leg: No edema.     Left lower leg: No edema.  Lymphadenopathy:     Cervical: No cervical adenopathy.  Skin:    General: Skin is warm and dry.     Findings: No erythema or rash.  Neurological:     Mental Status: She is alert and oriented to person, place, and time.     Cranial Nerves: No cranial nerve deficit.     Deep Tendon Reflexes: Reflexes are normal and symmetric.  Psychiatric:        Mood and Affect: Mood normal.        Behavior: Behavior normal.        Thought Content: Thought content normal.        Judgment: Judgment normal.      No results found for any visits on 08/11/23.  Last CBC Lab Results  Component Value Date   WBC 7.9 07/28/2023   HGB 12.7 07/28/2023   HCT 38.6 07/28/2023   MCV 95 07/28/2023   MCH 31.4 07/28/2023   RDW 12.6 07/28/2023   PLT 263 07/28/2023   Last metabolic panel Lab Results  Component Value Date   GLUCOSE 101 (H) 07/28/2023   NA 138 07/28/2023   K 3.9 07/28/2023   CL 100 07/28/2023   CO2 24 07/28/2023   BUN 12 07/28/2023   CREATININE 0.72 07/28/2023   EGFR 94 07/28/2023    CALCIUM 9.5 07/28/2023   PROT 7.3 07/28/2023   ALBUMIN 4.7 07/28/2023   LABGLOB 2.6 07/28/2023   AGRATIO 2.0 02/22/2022   BILITOT 0.4 07/28/2023   ALKPHOS 78 07/28/2023   AST 19 07/28/2023   ALT 34 (H) 07/28/2023   Last lipids Lab Results  Component Value Date   CHOL 184 05/11/2023   HDL 91 05/11/2023   LDLCALC 72 05/11/2023   LDLDIRECT 132.6 05/23/2013   TRIG  133 05/11/2023   CHOLHDL 2.0 05/11/2023   Last hemoglobin A1c Lab Results  Component Value Date   HGBA1C 5.9 (H) 11/28/2022   Last thyroid functions Lab Results  Component Value Date   TSH 0.753 02/22/2022   Last vitamin D Lab Results  Component Value Date   VD25OH 30.1 11/28/2022   Last vitamin B12 and Folate Lab Results  Component Value Date   VITAMINB12 1,042 05/30/2022      The 10-year ASCVD risk score (Arnett DK, et al., 2019) is: 3.3%    Assessment & Plan:   Problem List Items Addressed This Visit       Unprioritized   Mood disorder (HCC)- Emotional Eating   Relevant Medications   sertraline (ZOLOFT) 50 MG tablet   Acute otitis media - Primary    Omnicef for 7 dayys  Floxin otitc Con't nasonex  Pt is on prednisone for RA F/u as needed       Relevant Medications   cefdinir (OMNICEF) 300 MG capsule   ofloxacin (FLOXIN) 0.3 % OTIC solution  Assessment and Plan    Rheumatoid Arthritis (RA) Flare   She reports a severe RA flare affecting her jaw, hand, and wrist, experiencing 'knitting needle' pain for five days, unrelieved by prednisone and Tylenol, and prefers not to use decongestants due to dryness and hypertension medication contraindications. We will prescribe Omnicef capsules for one week, twice daily, advising her to open the capsules and mix with applesauce if she is unable to swallow pills. Additionally, we will prescribe ear drops, administering 10 drops in the left ear.  Hypertension   She is on amlodipine for blood pressure management, with decongestants contraindicated due  to this medication. We will continue the current antihypertensive regimen.  Allergic Rhinitis   She is using Nasonex with partial relief, with decongestants contraindicated due to hypertension medication. We will continue Nasonex as prescribed.  Osteoporosis   She is on Evenity, planning to switch to Prolia in January but prefers to continue [place]. We discussed seeking pharmaceutical assistance for coverage and will seek assistance from pharmaceutical reps to continue Evenity.  Depression   She is on sertraline, recently changed to a 90-day prescription. Considering weaning off but decided to wait due to recent stressors including a family death and RA flare. We will continue sertraline as prescribed and inform the pharmacy that a 90-day prescription is not needed immediately.  General Health Maintenance   We discussed the care of her elderly mother and the potential need for social work assistance. We will offer social worker assistance for managing the care of her elderly mother.  Follow-up   She will fill Omnicef and ear drops prescriptions downstairs and follow up if symptoms persist or worsen.        Return if symptoms worsen or fail to improve.    Donato Schultz, DO

## 2023-08-11 NOTE — Patient Instructions (Signed)

## 2023-08-12 ENCOUNTER — Telehealth: Payer: Commercial Managed Care - PPO | Admitting: Physician Assistant

## 2023-08-12 DIAGNOSIS — H9202 Otalgia, left ear: Secondary | ICD-10-CM

## 2023-08-12 DIAGNOSIS — R21 Rash and other nonspecific skin eruption: Secondary | ICD-10-CM

## 2023-08-12 DIAGNOSIS — Z8739 Personal history of other diseases of the musculoskeletal system and connective tissue: Secondary | ICD-10-CM

## 2023-08-12 NOTE — Patient Instructions (Signed)
Sharon Hunter, thank you for joining Laure Kidney, PA-C for today's virtual visit.  While this provider is not your primary care provider (PCP), if your PCP is located in our provider database this encounter information will be shared with them immediately following your visit.   A [facility] MyChart account gives you access to today's visit and all your visits, tests, and labs performed at [facility] " click here if you don't have a [facility] MyChart account or go to [email or web address]  Consent: (Patient) Sharon Hunter provided verbal consent for this virtual visit at the beginning of the encounter.  Current Medications:  Current Outpatient Medications:    amLODipine (NORVASC) 5 MG tablet, Take 1 tablet (5 mg total) by mouth daily., Disp: 90 tablet, Rfl: 3   cefdinir (OMNICEF) 300 MG capsule, Take 1 capsule (300 mg total) by mouth 2 (two) times daily., Disp: 20 capsule, Rfl: 0   Cholecalciferol 125 MCG (5000 UT) CHEW, Chew 1 tablet by mouth daily at 6 (six) AM., Disp: 30 tablet, Rfl: 0   diclofenac Sodium (VOLTAREN) 1 % GEL, Apply 2-4 grams to affected area up to 4 times daily., Disp: 400 g, Rfl: 2   Doxylamine Succinate, Sleep, (UNISOM PO), Take 1 tablet by mouth at bedtime., Disp: , Rfl:    hydrochlorothiazide (HYDRODIURIL) 25 MG tablet, Take 1/2 tablet (12.5 mg total) by mouth daily., Disp: 45 tablet, Rfl: 3   Ibuprofen (MOTRIN PO), Take by mouth as needed., Disp: , Rfl:    leflunomide (ARAVA) 10 MG tablet, Take 1 tablet (10 mg total) by mouth daily., Disp: 30 tablet, Rfl: 0   Magnesium 200 MG CHEW, Chew by mouth., Disp: , Rfl:    MELATONIN PO, Take 5-10 mg by mouth at bedtime., Disp: , Rfl:    Multiple Vitamin (MULTIVITAMIN PO), Take by mouth daily., Disp: , Rfl:    ofloxacin (FLOXIN) 0.3 % OTIC solution, Place 10 drops into the left ear daily., Disp: 5 mL, Rfl: 1   Romosozumab-aqqg (EVENITY) 105 MG/1. SOSY injection, To be given at office.  Pt appt is  06/14/23, Disp: 2.34 mL, Rfl: 6   rosuvastatin (CRESTOR) 10 MG tablet, Take 1 tablet (10 mg total) by mouth daily., Disp: 90 tablet, Rfl: 3   sertraline (ZOLOFT) 50 MG tablet, Take 1 tablet (50 mg total) by mouth daily., Disp: 90 tablet, Rfl: 1   Tofacitinib Citrate (XELJANZ) 1 MG/ML SOLN, Take 5 mLs by mouth in the morning and at bedtime. Discard open bottle after 60 days from opening. (Patient taking differently: Take 5 mLs by mouth daily. Discard open bottle after 60 days from opening.), Disp: 240 mL, Rfl: 2   valsartan (DIOVAN) 80 MG tablet, Take 1 tablet (80 mg total) by mouth daily., Disp: 90 tablet, Rfl: 3   Medications ordered in this encounter:  No orders of the defined types were placed in this encounter.    *If you need refills on other medications prior to your next appointment, please contact your pharmacy*  Follow-Up: Call back or seek an in-person evaluation if the symptoms worsen or if the condition fails to improve as anticipated.  [facility] [phone]  Other Instructions Report to nearest UC or ER for evaluation.    If you have been instructed to have an in-person evaluation today at a local Urgent Care facility, please use the link below. It will take you to a list of all of our available [facility], including address, phone  number and hours of operation. Please do not delay care.  [facility]  If you or a family member do not have a primary care provider, use the link below to schedule a visit and establish care. When you choose a [facility] primary care physician or advanced practice provider, you gain a long-term partner in health. Find a Primary Care Provider  Learn more about [facility]'s in-office and virtual care options: [facility] - Get Care Now

## 2023-08-12 NOTE — Progress Notes (Signed)
Virtual Visit Consent   Sharon Hunter, you are scheduled for a virtual visit with a [facility] provider today. Just as with appointments in the office, your consent must be obtained to participate. Your consent will be active for this visit and any virtual visit you may have with one of our providers in the next 365 days. If you have a MyChart account, a copy of this consent can be sent to you electronically.  As this is a virtual visit, video technology does not allow for your provider to perform a traditional examination. This may limit your provider's ability to fully assess your condition. If your provider identifies any concerns that need to be evaluated in person or the need to arrange testing (such as labs, EKG, etc.), we will make arrangements to do so. Although advances in technology are sophisticated, we cannot ensure that it will always work on either your end or our end. If the connection with a video visit is poor, the visit may have to be switched to a telephone visit. With either a video or telephone visit, we are not always able to ensure that we have a secure connection.  By engaging in this virtual visit, you consent to the provision of healthcare and authorize for your insurance to be billed (if applicable) for the services provided during this visit. Depending on your insurance coverage, you may receive a charge related to this service.  I need to obtain your verbal consent now. Are you willing to proceed with your visit today? Sharon Hunter has provided verbal consent on 08/12/2023 for a virtual visit (video or telephone). Laure Kidney, [place]  Date: 08/12/2023 1:25 PM  Virtual Visit via Video Note   I, Laure Kidney, connected with  Sharon Hunter  ([id number], May 11, 1961) on 08/12/23 at  1:00 PM EST by a video-enabled telemedicine application and verified that I am speaking with the correct person using two identifiers.  Location: Patient: Virtual Visit Location Patient:  Home Provider: Virtual Visit Location Provider: Home Office   I discussed the limitations of evaluation and management by telemedicine and the availability of in person appointments. The patient expressed understanding and agreed to proceed.    History of Present Illness: Sharon Hunter is a 62 y.o. who identifies as a female who was assigned female at birth, and is being seen today for left ear pain. Previously seen by PCP and started on abx. Also on steroids and immunosuppressants for RA. Concerned that lesion could be possible shingles exacerbation. Marland Kitchen  HPI: Otalgia  There is pain in the left ear. This is a new problem. The current episode started in the past 7 days. The problem occurs constantly. The problem has been gradually improving. There has been no fever. Associated symptoms include a rash. She has tried antibiotics for the symptoms. The treatment provided mild relief. There is no history of a chronic ear infection, hearing loss or a tympanostomy tube.    Problems:  Patient Active Problem List   Diagnosis Date Noted   Acute otitis media 08/11/2023   Mood disorder (HCC)- Emotional Eating 08/02/2022   Class 1 obesity with serious comorbidity and body mass index (BMI) of 30.0 to 30.9 in adult 08/02/2022   At risk for impaired metabolic function 05/30/2022   Perioral dermatitis 05/26/2022   Preventative health care 05/26/2022   Essential hypertension 05/26/2022   Abnormal EKG 05/26/2022   Prediabetes 03/09/2022   Vitamin B12 deficiency 03/09/2022   Primary hypertension 02/22/2022  Hyperlipidemia 02/22/2022   GAD (generalized anxiety disorder) 02/22/2022   Vitamin D deficiency 02/22/2022   Ear pain, left 08/13/2021   History of anxiety 03/27/2017   History of hypertension 03/27/2017   History of bundle branch block 03/27/2017   Primary osteoarthritis of both hands 12/22/2016   History of shingles 08/02/2016   High risk medication use 08/02/2016   Hypertension 08/01/2016   IBS  (irritable bowel syndrome) 08/01/2016   Anxiety 08/01/2016   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   Contrast Media [Iodinated Contrast Media]    Medications:  Current Outpatient Medications:    amLODipine (NORVASC) 5 MG tablet, Take 1 tablet (5 mg total) by mouth daily., Disp: 90 tablet, Rfl: 3   cefdinir (OMNICEF) 300 MG capsule, Take 1 capsule (300 mg total) by mouth 2 (two) times daily., Disp: 20 capsule, Rfl: 0   Cholecalciferol 125 MCG (5000 UT) CHEW, Chew 1 tablet by mouth daily at 6 (six) AM., Disp: 30 tablet, Rfl: 0   diclofenac Sodium (VOLTAREN) 1 % GEL, Apply 2-4 grams to affected area up to 4 times daily., Disp: 400 g, Rfl: 2   Doxylamine Succinate, Sleep, (UNISOM PO), Take 1 tablet by mouth at bedtime., Disp: , Rfl:    hydrochlorothiazide (HYDRODIURIL) 25 MG tablet, Take 1/2 tablet (12.5 mg total) by mouth daily., Disp: 45 tablet, Rfl: 3   Ibuprofen (MOTRIN PO), Take by mouth as needed., Disp: , Rfl:    leflunomide (ARAVA) 10 MG tablet, Take 1 tablet (10 mg total) by mouth daily., Disp: 30 tablet, Rfl: 0   Magnesium 200 MG CHEW, Chew by mouth., Disp: , Rfl:    MELATONIN PO, Take 5-10 mg by mouth at bedtime., Disp: , Rfl:    Multiple Vitamin (MULTIVITAMIN PO), Take by mouth daily., Disp: , Rfl:    ofloxacin (FLOXIN) 0.3 % OTIC solution, Place 10 drops into the left ear daily., Disp: 5 mL, Rfl: 1   Romosozumab-aqqg (EVENITY) 105 MG/1. SOSY injection, To be given at office.  Pt appt is 06/14/23, Disp: 2.34 mL, Rfl: 6   rosuvastatin (CRESTOR) 10 MG tablet, Take 1 tablet (10 mg total) by mouth daily., Disp: 90 tablet, Rfl: 3   sertraline (ZOLOFT) 50 MG tablet,  Take 1 tablet (50 mg total) by mouth daily., Disp: 90 tablet, Rfl: 1   Tofacitinib Citrate (XELJANZ) 1 MG/ML SOLN, Take 5 mLs by mouth in the morning and at bedtime. Discard open bottle after 60 days from opening. (Patient taking differently: Take 5 mLs by mouth daily. Discard open bottle after 60 days from opening.), Disp: 240 mL, Rfl: 2   valsartan (DIOVAN) 80 MG tablet, Take 1 tablet (80 mg total) by mouth daily., Disp: 90 tablet, Rfl: 3  Observations/Objective: Patient is well-developed, well-nourished in no acute distress.  Resting comfortably in her parked car in the parking lot.  Head is normocephalic, atraumatic.  No labored breathing.  Speech is clear and coherent with logical content.  Patient is alert and oriented at baseline.  Small red perioral appearing rash at eft of face with also reddened area at preauricular region. No trismus   Assessment and Plan: 1. Acute otalgia, left  2. Facial rash  3. History of rheumatoid arthritis Given patients history and presentation, I recommended that she report to UC for in person evaluation. Concerned for possible shingles/ramsay hunt which would need to be ruled out in person. She agreed to this plan.   Follow Up Instructions: I discussed the assessment and treatment plan with the patient. The patient was provided an opportunity to ask questions and all were answered. The patient agreed with the plan and demonstrated an understanding of the instructions.  A copy of instructions were sent to the patient via MyChart unless otherwise noted below.    The patient was advised to call back or seek an in-person evaluation if the symptoms worsen or if the condition fails to improve as anticipated.    Laure Kidney, PA-C

## 2023-08-14 ENCOUNTER — Other Ambulatory Visit (HOSPITAL_BASED_OUTPATIENT_CLINIC_OR_DEPARTMENT_OTHER): Payer: Self-pay

## 2023-08-14 ENCOUNTER — Encounter: Payer: Self-pay | Admitting: Family Medicine

## 2023-08-14 ENCOUNTER — Ambulatory Visit: Payer: Commercial Managed Care - PPO | Admitting: Family Medicine

## 2023-08-14 ENCOUNTER — Telehealth: Payer: Self-pay | Admitting: Rheumatology

## 2023-08-14 VITALS — BP 118/80 | HR 67 | Temp 98.1°F | Resp 18 | Ht 67.0 in

## 2023-08-14 DIAGNOSIS — B029 Zoster without complications: Secondary | ICD-10-CM | POA: Diagnosis not present

## 2023-08-14 MED ORDER — VALACYCLOVIR HCL 1 G PO TABS
1000.0000 mg | ORAL_TABLET | Freq: Three times a day (TID) | ORAL | 0 refills | Status: DC
  Filled 2023-08-14: qty 30, 10d supply, fill #0

## 2023-08-14 MED ORDER — PREDNISONE 10 MG PO TABS
ORAL_TABLET | ORAL | 0 refills | Status: AC
  Filled 2023-08-14: qty 20, 12d supply, fill #0

## 2023-08-14 NOTE — Telephone Encounter (Signed)
Ladona Ridgel responded to my chart message and addressed patient's questions and concerns.

## 2023-08-14 NOTE — Progress Notes (Signed)
Established Patient Office Visit  Subjective   Patient ID: GERALDEEN MINASYAN, female    DOB: May 27, 1961  Age: 62 y.o. MRN: [id number]  Chief Complaint  Patient presents with   Rash    Sxs started last week, rash on face, inside mouth. Pt states having pain down her throat.     HPI Discussed the use of AI scribe software for clinical note transcription with the patient, who gave verbal consent to proceed.  History of Present Illness   The patient, with a history of rheumatoid arthritis, presented with a new onset of facial pain and rash. She noticed two red spots on her face after showering, which she initially attributed to scrubbing too hard. However, by the evening, the pain had intensified and the rash had spread, leading her to suspect shingles. The pain was described as 'zaps' and was severe enough to cause her to yelp. The rash and pain were localized to the left side of her face, including the ear and mouth, and she reported a pustule had formed inside her mouth.  The patient was also experiencing confusion regarding her medication regimen. She had stopped her antibiotics and rheumatoid medications due to uncertainty about their compatibility with her current condition. She was previously on steroids for her rheumatoid arthritis, but had run out and was unsure if she should continue them. She was awaiting guidance from her rheumatologist.  The patient also mentioned that she was due to work that day, but was unsure if she would be allowed to due to her suspected shingles. She was aware that she could potentially be contagious to those who had not had chickenpox.      Patient Active Problem List   Diagnosis Date Noted   Acute otitis media 08/11/2023   Mood disorder (HCC)- Emotional Eating 08/02/2022   Class 1 obesity with serious comorbidity and body mass index (BMI) of 30.0 to 30.9 in adult 08/02/2022   At risk for impaired metabolic function 05/30/2022   Perioral dermatitis  05/26/2022   Preventative health care 05/26/2022   Essential hypertension 05/26/2022   Abnormal EKG 05/26/2022   Prediabetes 03/09/2022   Vitamin B12 deficiency 03/09/2022   Primary hypertension 02/22/2022   Hyperlipidemia 02/22/2022   GAD (generalized anxiety disorder) 02/22/2022   Vitamin D deficiency 02/22/2022   Ear pain, left 08/13/2021   History of anxiety 03/27/2017   History of hypertension 03/27/2017   History of bundle branch block 03/27/2017   Primary osteoarthritis of both hands 12/22/2016   History of shingles 08/02/2016   High risk medication use 08/02/2016   Hypertension 08/01/2016   IBS (irritable bowel syndrome) 08/01/2016   Anxiety 08/01/2016   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   LBBB (left bundle branch block) 01/30/2015   Palpitations 01/30/2015   Pulmonary nodule 07/04/2011   Murmur 02/03/2011   Rheumatoid arthritis (HCC) 02/03/2011   Hair loss 02/03/2011   Fatigue 02/03/2011   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   Hypertension    IBS (irritable bowel syndrome) 08/01/2016   Joint pain    Lactose intolerance    Migraine    Murmur 02/03/2011   Obese    Ovarian failure    Palpitation    Pulmonary nodule, left 2009   last CT Nov 2011-- due 08/2011   Rheumatoid arthritis(714.0)    in remission , off meds  as of Summer 2012   Vitamin D deficiency    Past Surgical History:  Procedure Laterality Date   CHOLECYSTECTOMY  1999   COLONOSCOPY  10/31/11   EXPLORATORY LAPAROTOMY     TONSILLECTOMY  1969   WISDOM TOOTH EXTRACTION     Social History   Tobacco Use   Smoking status: Former    Current packs/day: 0.00    Types: Cigarettes    Quit date: 10/03/1990    Years since quitting: 32.8    Passive exposure: Past   Smokeless tobacco: Never  Vaping Use   Vaping status: Never Used  Substance Use Topics   Alcohol use: Yes     Alcohol/week: 3.0 standard drinks of alcohol    Types: 3 Standard drinks or equivalent per week    Comment: Red wine 4 oz occ   Drug use: No   Social History   Socioeconomic History   Marital status: Divorced    Spouse name: Not on file   Number of children: 1   Years of education: 17   Highest education level: Not on file  Occupational History   Occupation: RN  Tobacco Use   Smoking status: Former    Current packs/day: 0.00    Types: Cigarettes    Quit date: 10/03/1990    Years since quitting: 32.8    Passive exposure: Past   Smokeless tobacco: Never  Vaping Use   Vaping status: Never Used  Substance and Sexual Activity   Alcohol use: Yes    Alcohol/week: 3.0 standard drinks of alcohol    Types: 3 Standard drinks or equivalent per week    Comment: Red wine 4 oz occ   Drug use: No   Sexual activity: Not Currently    Partners: Male  Other Topics Concern   Not on file  Social History Narrative   Exercise-- jogs   Social Determinants of Health   Financial Resource Strain: Not on file  Food Insecurity: Not on file  Transportation Needs: Not on file  Physical Activity: Not on file  Stress: Not on file  Social Connections: Not on file  Intimate Partner Violence: Not on file   Family Status  Relation Name Status   Mother  Alive   Father  Deceased   Sister  Deceased at age 57   Brother  Alive   Brother  Deceased at age 6       lung cancer   Mat Aunt  Alive  No partnership data on file   Family History  Problem Relation Age of Onset   Cancer Mother    Arthritis Mother    Uterine cancer Mother    Hypertension Mother    Diabetes Mother    Alcohol abuse Father    Hyperlipidemia Father    Heart disease Father    High blood pressure Father    Sleep apnea Father    Uterine cancer Sister    Heart disease Sister        cardiac stent   Diabetes Sister    Hyperlipidemia Sister    Hypertension Sister    Stroke Sister    Alcohol abuse Brother    Diabetes  Brother    Hypertension Brother    Cancer Brother        lung   Cancer Maternal Aunt        lung----non smoker   Allergies  Allergen Reactions   Other Swelling    Contrast dye- head swelling per pt.   Contrast  Media [Iodinated Contrast Media]     Review of Systems  Constitutional:  Negative for fever and malaise/fatigue.  HENT:  Negative for congestion.   Eyes:  Negative for blurred vision.  Respiratory:  Negative for shortness of breath.   Cardiovascular:  Negative for chest pain, palpitations and leg swelling.  Gastrointestinal:  Negative for abdominal pain, blood in stool and nausea.  Genitourinary:  Negative for dysuria and frequency.  Musculoskeletal:  Negative for falls.  Skin:  Positive for rash.  Neurological:  Negative for dizziness, loss of consciousness and headaches.  Endo/Heme/Allergies:  Negative for environmental allergies.  Psychiatric/Behavioral:  Negative for depression. The patient is not nervous/anxious.       Objective:     BP 118/80 (BP Location: Right Arm, Patient Position: Sitting, Cuff Size: Normal)   Pulse 67   Temp 98.1 F (36.7 C) (Oral)   Resp 18   Ht 5\' 7"  (1.702 m)   LMP 10/03/2005   SpO2 98%   BMI 31.54 kg/m  BP Readings from Last 3 Encounters:  08/14/23 118/80  08/11/23 118/78  07/31/23 120/80   Wt Readings from Last 3 Encounters:  07/31/23 201 lb 6.4 oz (91.4 kg)  07/12/23 193 lb (87.5 kg)  05/19/23 193 lb (87.5 kg)   SpO2 Readings from Last 3 Encounters:  08/14/23 98%  08/11/23 96%  07/31/23 97%      Physical Exam Vitals and nursing note reviewed.  Constitutional:      General: She is not in acute distress.    Appearance: Normal appearance. She is well-developed.  HENT:     Head: Normocephalic and atraumatic.  Eyes:     General: No scleral icterus.       Right eye: No discharge.        Left eye: No discharge.  Cardiovascular:     Rate and Rhythm: Normal rate and regular rhythm.     Heart sounds: No murmur  heard. Pulmonary:     Effort: Pulmonary effort is normal. No respiratory distress.     Breath sounds: Normal breath sounds.  Musculoskeletal:        General: Normal range of motion.     Cervical back: Normal range of motion and neck supple.     Right lower leg: No edema.     Left lower leg: No edema.  Skin:    General: Skin is warm and dry.     Findings: Rash present. Rash is vesicular.          Comments: Vesicular rash ---- L side face , jaw and up to ear Nothing inside ear  + painful vesicle inside mouth L side   Neurological:     Mental Status: She is alert and oriented to person, place, and time.  Psychiatric:        Mood and Affect: Mood normal.        Behavior: Behavior normal.        Thought Content: Thought content normal.        Judgment: Judgment normal.     No results found for any visits on 08/14/23.  Last CBC Lab Results  Component Value Date   WBC 7.9 07/28/2023   HGB 12.7 07/28/2023   HCT 38.6 07/28/2023   MCV 95 07/28/2023   MCH 31.4 07/28/2023   RDW 12.6 07/28/2023   PLT 263 07/28/2023   Last metabolic panel Lab Results  Component Value Date   GLUCOSE 101 (H) 07/28/2023   NA 138 07/28/2023  K 3.9 07/28/2023   CL 100 07/28/2023   CO2 24 07/28/2023   BUN 12 07/28/2023   CREATININE 0.72 07/28/2023   EGFR 94 07/28/2023   CALCIUM 9.5 07/28/2023   PROT 7.3 07/28/2023   ALBUMIN 4.7 07/28/2023   LABGLOB 2.6 07/28/2023   AGRATIO 2.0 02/22/2022   BILITOT 0.4 07/28/2023   ALKPHOS 78 07/28/2023   AST 19 07/28/2023   ALT 34 (H) 07/28/2023   Last lipids Lab Results  Component Value Date   CHOL 184 05/11/2023   HDL 91 05/11/2023   LDLCALC 72 05/11/2023   LDLDIRECT 132.6 05/23/2013   TRIG 133 05/11/2023   CHOLHDL 2.0 05/11/2023   Last hemoglobin A1c Lab Results  Component Value Date   HGBA1C 5.9 (H) 11/28/2022   Last thyroid functions Lab Results  Component Value Date   TSH 0.753 02/22/2022   Last vitamin D Lab Results  Component  Value Date   VD25OH 30.1 11/28/2022   Last vitamin B12 and Folate Lab Results  Component Value Date   VITAMINB12 1,042 05/30/2022      The 10-year ASCVD risk score (Arnett DK, et al., 2019) is: 3.3%    Assessment & Plan:   Problem List Items Addressed This Visit   None Visit Diagnoses     Herpes zoster without complication    -  Primary   Relevant Medications   valACYclovir (VALTREX) 1000 MG tablet   predniSONE (DELTASONE) 10 MG tablet     Assessment and Plan    Herpes Zoster (Shingles) She presents with acute shingles localized to the left face, including the ear and mouth, with symptoms starting shortly after the last visit. Initial symptoms were red spots, progressing to pain and pustules, with pain described as 'zaps' and worsening with eating and drinking. We discussed Valtrex's compatibility with her rheumatoid arthritis (RA) medications and its contagiousness, which is limited to those without prior chickenpox exposure. We will prescribe Valtrex and a prednisone taper (10 mg: 3/day for 3 days, 2/day for 3 days, 1/day). A consultation with a pharmacist about crushing Valtrex tablets is necessary. She will continue using ear drops and consider viscous lidocaine for oral pain. Consultations with a rheumatologist regarding RA medications Harriette Ohara, [place]) and with her employer about work restrictions due to contagiousness are planned.  Rheumatoid Arthritis Her chronic condition is managed with Harriette Ohara and Ranae Plumber, which were stopped due to concerns about interactions with shingles treatment, leading to a flare. She is on maintenance prednisone (10 mg daily). We will consult a rheumatologist regarding the RA medications Harriette Ohara, [place]) and continue the maintenance prednisone.  General Health Maintenance She is managing multiple medications with concerns about drug interactions and the impact on work. Consultations with a pharmacist regarding medication interactions and  administration, and with her employer about work restrictions due to contagiousness, are advised.        Return if symptoms worsen or fail to improve.    Donato Schultz, DO

## 2023-08-14 NOTE — Telephone Encounter (Signed)
Pt called and sent a mychart message requesting an appointment today. Pt states she may have shingles and is on new meds. Pt would like to talk about medication and if its okay to take everything together. Pt will be going to her PCP today at 8:40AM for the shingles.

## 2023-08-14 NOTE — Patient Instructions (Signed)
Shingles  Shingles, or herpes zoster, is an infection. It gives you a skin rash and blisters. These infected areas may hurt a lot. Shingles only happens if: You've had chickenpox. You've been given a shot called a vaccine to protect you from getting chickenpox. Shingles is rare in this case. What are the causes? Shingles is caused by a germ called the varicella-zoster virus. This is the same germ that causes chickenpox. After you're exposed to the germ, it stays in your body but is dormant. This means it isn't active. Shingles happens if the germ becomes active again. This can happen years after you're first exposed to the germ. What increases the risk? You may be more likely to get shingles if: You're older than 62 years of age. You're under a lot of stress. You have a weak immune system. The immune system is your body's defense system. It may be weak if: You have human immunodeficiency virus (HIV). You have acquired immunodeficiency syndrome (AIDS). You have cancer. You take medicines that weaken your immune system. These include organ transplant medicines. What are the signs or symptoms? The first symptoms of shingles may be itching, tingling, or pain. Your skin may feel like it's burning. A few days or weeks later, you'll get a rash. Here's what you can expect: The rash is likely to be on one side of your body. The rash may be shaped like a belt or a band. Over time, it will turn into blisters filled with fluid. The blisters will break open and change into scabs. The scabs will dry up in about 2-3 weeks. You may also have: A fever. Chills. A headache. Nausea. How is this diagnosed? Shingles is diagnosed with a skin exam. A sample called a culture may be taken from one of your blisters and sent to a lab. This will show if you have shingles. How is this treated? The rash may last for several weeks. There's no cure for shingles, but your health care provider may give you medicines.  These medicines may: Help with pain. Help with itching. Help with irritation and swelling. Help you get better sooner. Help to prevent long-term problems. If the rash is on your face, you may need to see an eye doctor or an ear, nose, and throat (ENT) doctor. Follow these instructions at home: Medicines Take your medicines only as told by your provider. Put an anti-itch cream or numbing cream on the rash or blisters as told by your provider. Relieving itching and discomfort  To help with itching: Put cold, wet cloths called cold compresses on the rash or blisters. Take a cool bath. Try adding baking soda or dry oatmeal to the water. Do not bathe in hot water. Use calamine lotion on the rash or blisters. You can get this type of lotion at the store. Blister and rash care Keep your rash covered with a loose bandage. Wear loose clothes that don't rub on your rash. Take care of your rash as told by your provider. Make sure you: Wash your hands with soap and water for at least 20 seconds before and after you change your bandage. If you can't use soap and water, use hand sanitizer. Keep your rash and blisters clean by washing them with mild soap and cool water. Change your bandage. Check your rash every day for signs of infection. Check for: More redness, swelling, or pain. Fluid or blood. Warmth. Pus or a bad smell. Do not scratch your rash. Do not pick at your  blisters. To help you not scratch: Keep your fingernails clean and cut short. Try to wear gloves or mittens when you sleep. General instructions Rest. Wash your hands often with soap and water for at least 20 seconds. If you can't use soap and water, use hand sanitizer. Washing your hands lowers your chance of getting a skin infection. Your infection can cause chickenpox in others. If you have blisters that aren't scabs yet, stay away from: Babies. Pregnant people. Children who have eczema. Older people who have organ  transplants. People who have a long-term, or chronic, illness. Anyone who hasn't had chickenpox before. Anyone who hasn't gotten the chickenpox vaccine. How is this prevented? Vaccines are the best way to prevent you from getting chickenpox or shingles. Talk with your provider about getting these shots. Where to find more information Centers for Disease Control and Prevention (CDC): TonerPromos.no Contact a health care provider if: Your pain doesn't get better with medicine. Your pain doesn't get better after the rash heals. You have any signs of infection around the rash. Your rash or blisters get worse. You have a fever or chills. Get help right away if: The rash is on your face or nose. You have pain in your face or by your eye. You lose feeling on one side of your face. You have trouble seeing. You have ear pain or ringing in your ear. This information is not intended to replace advice given to you by your health care provider. Make sure you discuss any questions you have with your health care provider. Document Revised: 11/04/2022 Document Reviewed: 11/04/2022 Elsevier Patient Education  2024 [employer].

## 2023-08-16 ENCOUNTER — Ambulatory Visit: Payer: Commercial Managed Care - PPO

## 2023-08-21 ENCOUNTER — Telehealth: Payer: Self-pay | Admitting: *Deleted

## 2023-08-21 NOTE — Telephone Encounter (Signed)
Called patient about Evenity injection.  She cancelled appointment due to her mom being sick and her being sick.   Her mom has an appointment on 08/25/23 with Dr. Laury Axon and she will get on that day.

## 2023-08-22 ENCOUNTER — Other Ambulatory Visit: Payer: Self-pay

## 2023-08-24 ENCOUNTER — Other Ambulatory Visit: Payer: Self-pay

## 2023-08-25 ENCOUNTER — Other Ambulatory Visit: Payer: Self-pay | Admitting: Pharmacist

## 2023-08-25 ENCOUNTER — Other Ambulatory Visit (HOSPITAL_COMMUNITY): Payer: Self-pay

## 2023-08-25 ENCOUNTER — Encounter: Payer: Self-pay | Admitting: Physician Assistant

## 2023-08-25 ENCOUNTER — Ambulatory Visit: Payer: Commercial Managed Care - PPO

## 2023-08-25 ENCOUNTER — Ambulatory Visit: Payer: Commercial Managed Care - PPO | Attending: Physician Assistant | Admitting: Physician Assistant

## 2023-08-25 ENCOUNTER — Other Ambulatory Visit: Payer: Self-pay

## 2023-08-25 VITALS — BP 130/84 | HR 56 | Resp 16 | Ht 67.0 in | Wt 202.0 lb

## 2023-08-25 DIAGNOSIS — M25471 Effusion, right ankle: Secondary | ICD-10-CM

## 2023-08-25 DIAGNOSIS — Z8679 Personal history of other diseases of the circulatory system: Secondary | ICD-10-CM

## 2023-08-25 DIAGNOSIS — Z8639 Personal history of other endocrine, nutritional and metabolic disease: Secondary | ICD-10-CM | POA: Diagnosis not present

## 2023-08-25 DIAGNOSIS — M19041 Primary osteoarthritis, right hand: Secondary | ICD-10-CM

## 2023-08-25 DIAGNOSIS — G8929 Other chronic pain: Secondary | ICD-10-CM

## 2023-08-25 DIAGNOSIS — Z8659 Personal history of other mental and behavioral disorders: Secondary | ICD-10-CM | POA: Diagnosis not present

## 2023-08-25 DIAGNOSIS — M810 Age-related osteoporosis without current pathological fracture: Secondary | ICD-10-CM

## 2023-08-25 DIAGNOSIS — Z8619 Personal history of other infectious and parasitic diseases: Secondary | ICD-10-CM

## 2023-08-25 DIAGNOSIS — M3500 Sicca syndrome, unspecified: Secondary | ICD-10-CM | POA: Diagnosis not present

## 2023-08-25 DIAGNOSIS — Z79899 Other long term (current) drug therapy: Secondary | ICD-10-CM

## 2023-08-25 DIAGNOSIS — M25511 Pain in right shoulder: Secondary | ICD-10-CM | POA: Diagnosis not present

## 2023-08-25 DIAGNOSIS — R5383 Other fatigue: Secondary | ICD-10-CM | POA: Diagnosis not present

## 2023-08-25 DIAGNOSIS — M25512 Pain in left shoulder: Secondary | ICD-10-CM

## 2023-08-25 DIAGNOSIS — M0579 Rheumatoid arthritis with rheumatoid factor of multiple sites without organ or systems involvement: Secondary | ICD-10-CM | POA: Diagnosis not present

## 2023-08-25 DIAGNOSIS — M19042 Primary osteoarthritis, left hand: Secondary | ICD-10-CM

## 2023-08-25 DIAGNOSIS — R911 Solitary pulmonary nodule: Secondary | ICD-10-CM

## 2023-08-25 MED ORDER — ROMOSOZUMAB-AQQG 105 MG/1.17ML ~~LOC~~ SOSY
210.0000 mg | PREFILLED_SYRINGE | Freq: Once | SUBCUTANEOUS | Status: AC
Start: 2023-08-25 — End: 2023-08-25
  Administered 2023-08-25: 210 mg via SUBCUTANEOUS

## 2023-08-25 MED ORDER — PREDNISONE 5 MG PO TABS
5.0000 mg | ORAL_TABLET | Freq: Every day | ORAL | 0 refills | Status: DC
  Filled 2023-08-25: qty 30, 30d supply, fill #0

## 2023-08-25 NOTE — Telephone Encounter (Signed)
Please review and sign pended prednisone rx. Thanks!

## 2023-08-25 NOTE — Progress Notes (Signed)
Patient discontinuing Harriette Ohara and all biologics for RA due to recurrence of Shingles. Patient is concerned regarding immunosuppression and risk for cancer with biologics.  She will restart low-dose leflunomide and low-dose maintenance prednisone  Chesley Mires, PharmD, MPH, BCPS, CPP Clinical Pharmacist (Rheumatology and Pulmonology)

## 2023-08-25 NOTE — Progress Notes (Signed)
Pt is in office today for Evenity injection. Pt supplied Evenity.   Evenity 105mg /1.49mL injected into L and R arm subcutaneous. Pt tolerated injection well.   Next in 1 month.

## 2023-08-26 LAB — COMPLETE METABOLIC PANEL WITH GFR
AG Ratio: 1.8 (calc) (ref 1.0–2.5)
ALT: 36 U/L — ABNORMAL HIGH (ref 6–29)
AST: 18 U/L (ref 10–35)
Albumin: 4.3 g/dL (ref 3.6–5.1)
Alkaline phosphatase (APISO): 58 U/L (ref 37–153)
BUN: 15 mg/dL (ref 7–25)
CO2: 30 mmol/L (ref 20–32)
Calcium: 9.5 mg/dL (ref 8.6–10.4)
Chloride: 99 mmol/L (ref 98–110)
Creat: 0.75 mg/dL (ref 0.50–1.05)
Globulin: 2.4 g/dL (ref 1.9–3.7)
Glucose, Bld: 106 mg/dL — ABNORMAL HIGH (ref 65–99)
Potassium: 4.3 mmol/L (ref 3.5–5.3)
Sodium: 138 mmol/L (ref 135–146)
Total Bilirubin: 0.6 mg/dL (ref 0.2–1.2)
Total Protein: 6.7 g/dL (ref 6.1–8.1)
eGFR: 90 mL/min/{1.73_m2} (ref >=60)

## 2023-08-26 LAB — CBC WITH DIFFERENTIAL/PLATELET
Absolute Lymphocytes: 2873 {cells}/uL (ref 850–3900)
Absolute Monocytes: 851 {cells}/uL (ref 200–950)
Basophils Absolute: 30 {cells}/uL (ref 0–200)
Basophils Relative: 0.4 %
Eosinophils Absolute: 91 {cells}/uL (ref 15–500)
Eosinophils Relative: 1.2 %
HCT: 39.2 % (ref 35.0–45.0)
Hemoglobin: 13.1 g/dL (ref 11.7–15.5)
MCH: 31.3 pg (ref 27.0–33.0)
MCHC: 33.4 g/dL (ref 32.0–36.0)
MCV: 93.8 fL (ref 80.0–100.0)
MPV: 10.3 fL (ref 7.5–12.5)
Monocytes Relative: 11.2 %
Neutro Abs: 3754 {cells}/uL (ref 1500–7800)
Neutrophils Relative %: 49.4 %
Platelets: 303 10*3/uL (ref 140–400)
RBC: 4.18 10*6/uL (ref 3.80–5.10)
RDW: 12.7 % (ref 11.0–15.0)
Total Lymphocyte: 37.8 %
WBC: 7.6 10*3/uL (ref 3.8–10.8)

## 2023-08-28 NOTE — Progress Notes (Signed)
Glucose is 106. ALT remains borderline elevated.  AST WNL.  Continue on low dose of arava 10 mg 1 tablet by mouth daily.  Rest of CMP WNL.   CBC WNL

## 2023-09-01 ENCOUNTER — Encounter: Payer: Self-pay | Admitting: Pharmacist

## 2023-09-01 ENCOUNTER — Other Ambulatory Visit: Payer: Self-pay

## 2023-09-01 ENCOUNTER — Other Ambulatory Visit (HOSPITAL_COMMUNITY): Payer: Self-pay

## 2023-09-04 ENCOUNTER — Other Ambulatory Visit: Payer: Self-pay | Admitting: Physician Assistant

## 2023-09-05 ENCOUNTER — Other Ambulatory Visit: Payer: Self-pay

## 2023-09-05 MED ORDER — LEFLUNOMIDE 10 MG PO TABS
10.0000 mg | ORAL_TABLET | Freq: Every day | ORAL | 0 refills | Status: DC
  Filled 2023-09-05: qty 30, 30d supply, fill #0

## 2023-09-05 NOTE — Telephone Encounter (Signed)
Last Fill: 08/07/2023  Labs: 08/25/2023 Glucose is 106. ALT remains borderline elevated.  AST WNL. Rest of CMP WNL.  CBC WNL  Next Visit: 10/25/2023  Last Visit: 08/25/2023  DX: Rheumatoid arthritis involving multiple sites with positive rheumatoid factor   Current Dose per office note 08/25/2023: Patient plans on restarting Arava 10 mg 1 tablet by mouth daily. If labs are stable we can try increasing Arava to 20 mg daily.   Okay to refill Arava ?

## 2023-09-09 ENCOUNTER — Other Ambulatory Visit (HOSPITAL_COMMUNITY): Payer: Self-pay

## 2023-09-11 ENCOUNTER — Other Ambulatory Visit: Payer: Self-pay

## 2023-09-11 ENCOUNTER — Encounter: Payer: Self-pay | Admitting: Pharmacist

## 2023-09-11 NOTE — Telephone Encounter (Signed)
Error

## 2023-09-13 ENCOUNTER — Other Ambulatory Visit (HOSPITAL_COMMUNITY): Payer: Self-pay

## 2023-09-13 ENCOUNTER — Other Ambulatory Visit (HOSPITAL_COMMUNITY): Payer: Self-pay | Admitting: Pharmacy Technician

## 2023-09-13 NOTE — Progress Notes (Signed)
Specialty Pharmacy Refill Coordination Note  Sharon Hunter is a 62 y.o. female contacted today regarding refills of specialty medication(s) Romosozumab-Aqqg   Patient requested Courier to Provider Office   Delivery date: 09/18/23   Verified address: [address]   Medication will be filled on 09/17/23.

## 2023-09-15 ENCOUNTER — Other Ambulatory Visit: Payer: Self-pay

## 2023-09-15 ENCOUNTER — Telehealth: Payer: Self-pay | Admitting: Pharmacy Technician

## 2023-09-15 ENCOUNTER — Other Ambulatory Visit (HOSPITAL_COMMUNITY): Payer: Self-pay

## 2023-09-15 NOTE — Telephone Encounter (Signed)
Received notification from [facility] regarding a prior authorization for  Evenity . Authorization has been APPROVED from 09/15/23 to 09/14/24.    Authorization # (KeyVerlene Mayer) PA Case ID #: [id number]

## 2023-09-15 NOTE — Telephone Encounter (Signed)
[facility] Notified

## 2023-09-15 NOTE — Telephone Encounter (Signed)
Received PA Request from [facility].   Submitted a Prior Authorization request to [facility] for  Evenity  via CoverMyMeds. Will update once we receive a response.  Key: (KeyVerlene Hunter) PA Case ID #: [id number]

## 2023-09-18 ENCOUNTER — Other Ambulatory Visit: Payer: Self-pay

## 2023-09-19 ENCOUNTER — Other Ambulatory Visit: Payer: Self-pay

## 2023-09-19 ENCOUNTER — Other Ambulatory Visit (HOSPITAL_COMMUNITY): Payer: Self-pay

## 2023-09-19 ENCOUNTER — Telehealth: Payer: Self-pay | Admitting: *Deleted

## 2023-09-19 DIAGNOSIS — M810 Age-related osteoporosis without current pathological fracture: Secondary | ICD-10-CM

## 2023-09-19 NOTE — Telephone Encounter (Signed)
Patient due for Evenity around 09/24/23.  Left message on machine to call back to schedule.  Looks like prior [place] team already got prior [place].

## 2023-09-19 NOTE — Telephone Encounter (Signed)
Evenity received from [facility] and placed in clinic fridge.

## 2023-09-20 NOTE — Telephone Encounter (Signed)
Pt scheduled for 09/28/23

## 2023-09-22 ENCOUNTER — Ambulatory Visit: Payer: Commercial Managed Care - PPO

## 2023-09-28 ENCOUNTER — Ambulatory Visit: Payer: Commercial Managed Care - PPO | Admitting: Emergency Medicine

## 2023-09-28 ENCOUNTER — Telehealth: Payer: Self-pay | Admitting: Emergency Medicine

## 2023-09-28 DIAGNOSIS — M810 Age-related osteoporosis without current pathological fracture: Secondary | ICD-10-CM

## 2023-09-28 MED ORDER — ROMOSOZUMAB-AQQG 105 MG/1.17ML ~~LOC~~ SOSY
210.0000 mg | PREFILLED_SYRINGE | Freq: Once | SUBCUTANEOUS | Status: AC
  Administered 2023-09-28: 210 mg via SUBCUTANEOUS

## 2023-09-28 MED ORDER — ROMOSOZUMAB-AQQG 105 MG/1.17ML ~~LOC~~ SOSY: 210.0000 mg | PREFILLED_SYRINGE | Freq: Once | SUBCUTANEOUS | Status: DC

## 2023-09-28 NOTE — Telephone Encounter (Signed)
Evenity given today

## 2023-09-28 NOTE — Progress Notes (Signed)
Pt here for Evenity injections per Dr. Laury Axon.   Injection given in left and right arm. Pt tolerated well

## 2023-10-11 ENCOUNTER — Other Ambulatory Visit: Payer: Self-pay

## 2023-10-11 NOTE — Progress Notes (Unsigned)
Office Visit Note  Patient: [name]             Date of Birth: 01-18-1961           MRN: [id number]             PCP: Donato Schultz, DO Referring: Donato Schultz, * Visit Date: 10/25/2023 Occupation: @GUAROCC @  Subjective:  Medication monitoring   History of Present Illness: JAHIA DILUZIO is a 63 y.o. female with history of seropositive rheumatoid arthritis and osteoarthritis.  Patient remains on arava 10 mg 1 tablet by mouth daily and prednisone 5 mg 1 tablet by mouth daily.  Patient reports that she seems to tolerate Arava without any side effects.  Patient reports that she was started on Crestor around the same time as Arava and was having daily diarrhea and tinnitus.  Patient reports that she discontinued Crestor about a week and a half ago and has no longer been having diarrhea and the tinnitus seems to improved.  Patient reports that her rheumatoid thrice is currently well-controlled and feels that the prednisone has been managing her inflammation. She denies any recent or recurrent infections. She remains on Evenity monthly injections for management of osteoporosis.    Activities of Daily Living:  Patient reports morning stiffness for 0 minute.   Patient Reports nocturnal pain.  Difficulty dressing/grooming: Reports Difficulty climbing stairs: Denies Difficulty getting out of chair: Denies Difficulty using hands for taps, buttons, cutlery, and/or writing: Reports  Review of Systems  Constitutional:  Positive for fatigue.  HENT:  Negative for mouth sores and mouth dryness.   Eyes:  Positive for dryness.  Respiratory:  Negative for shortness of breath.   Cardiovascular:  Negative for chest pain and palpitations.  Gastrointestinal:  Positive for diarrhea. Negative for blood in stool and constipation.  Endocrine: Negative for increased urination.  Genitourinary:  Negative for involuntary urination.  Musculoskeletal:  Positive for joint pain, joint pain and  joint swelling. Negative for gait problem, myalgias, muscle weakness, morning stiffness, muscle tenderness and myalgias.  Skin:  Negative for color change, rash, hair loss and sensitivity to sunlight.  Allergic/Immunologic: Negative for susceptible to infections.  Neurological:  Negative for dizziness and headaches.  Hematological:  Negative for swollen glands.  Psychiatric/Behavioral:  Negative for depressed mood and sleep disturbance. The patient is not nervous/anxious.     PMFS History:  Patient Active Problem List   Diagnosis Date Noted   Acute otitis media 08/11/2023   Mood disorder (HCC)- Emotional Eating 08/02/2022   Class 1 obesity with serious comorbidity and body mass index (BMI) of 30.0 to 30.9 in adult 08/02/2022   At risk for impaired metabolic function 05/30/2022   Perioral dermatitis 05/26/2022   Preventative health care 05/26/2022   Essential hypertension 05/26/2022   Abnormal EKG 05/26/2022   Prediabetes 03/09/2022   Vitamin B12 deficiency 03/09/2022   Primary hypertension 02/22/2022   Hyperlipidemia 02/22/2022   GAD (generalized anxiety disorder) 02/22/2022   Vitamin D deficiency 02/22/2022   Ear pain, left 08/13/2021   History of anxiety 03/27/2017   History of hypertension 03/27/2017   History of bundle branch block 03/27/2017   Primary osteoarthritis of both hands 12/22/2016   History of shingles 08/02/2016   High risk medication use 08/02/2016   Hypertension 08/01/2016   IBS (irritable bowel syndrome) 08/01/2016   Anxiety 08/01/2016   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   LBBB (left bundle branch block) 01/30/2015   Palpitations 01/30/2015  Pulmonary nodule 07/04/2011   Murmur 02/03/2011   Rheumatoid arthritis (HCC) 02/03/2011   Hair loss 02/03/2011   Fatigue 02/03/2011    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   Hypertension     IBS (irritable bowel syndrome) 08/01/2016   Joint pain    Lactose intolerance    Migraine    Murmur 02/03/2011   Obese    Ovarian failure    Palpitation    Pulmonary nodule, left 2009   last CT Nov 2011-- due 08/2011   Rheumatoid arthritis(714.0)    in remission , off meds as of Summer 2012   Shingles 08/2023   Vitamin D deficiency     Family History  Problem Relation Age of Onset   Cancer Mother    Arthritis Mother    Uterine cancer Mother    Hypertension Mother    Diabetes Mother    Esophageal cancer Mother    Alcohol abuse Father    Hyperlipidemia Father    Heart disease Father    High blood pressure Father    Sleep apnea Father    Uterine cancer Sister    Heart disease Sister        cardiac stent   Diabetes Sister    Hyperlipidemia Sister    Hypertension Sister    Stroke Sister    Alcohol abuse Brother    Diabetes Brother    Hypertension Brother    Cancer Brother        lung   Cancer Maternal Aunt        lung----non smoker   Past Surgical History:  Procedure Laterality Date   CHOLECYSTECTOMY  1999   COLONOSCOPY  10/31/11   EXPLORATORY LAPAROTOMY     TONSILLECTOMY  1969   WISDOM TOOTH EXTRACTION     Social History   Social History Narrative   Exercise-- jogs   Immunization History  Administered Date(s) Administered   Influenza Split 07/03/2013   Influenza,inj,Quad PF,6+ Mos 07/01/2023   Influenza,inj,Quad PF,6-35 Mos 07/17/2020   Influenza-Unspecified 07/03/2014, 07/04/2015   PFIZER(Purple Top)SARS-COV-2 Vaccination 01/13/2020, 05/15/2020   Tdap 12/01/2009     Objective: Vital Signs: BP 122/74 (BP Location: Left Arm, Patient Position: Sitting, Cuff Size: Normal)   Pulse 65   Resp 14   Ht 5\' 7"  (1.702 m)   Wt 209 lb (94.8 kg)   LMP 10/03/2005   BMI 32.73 kg/m    Physical Exam Vitals and nursing note reviewed.  Constitutional:      Appearance: She is well-developed.  HENT:     Head: Normocephalic and atraumatic.  Eyes:      Conjunctiva/sclera: Conjunctivae normal.  Cardiovascular:     Rate and Rhythm: Normal rate and regular rhythm.     Heart sounds: Normal heart sounds.  Pulmonary:     Effort: Pulmonary effort is normal.     Breath sounds: Normal breath sounds.  Abdominal:     General: Bowel sounds are normal.     Palpations: Abdomen is soft.  Musculoskeletal:     Cervical back: Normal range of motion.  Lymphadenopathy:     Cervical: No cervical adenopathy.  Skin:    General: Skin is warm and dry.     Capillary Refill: Capillary refill takes less than 2 seconds.  Neurological:     Mental Status: She is alert and oriented to person, place, and time.  Psychiatric:        Behavior: Behavior normal.      Musculoskeletal Exam: C-spine, thoracic spine, and lumbar spine good ROM.  Shoulder joints and elbow joints have good ROM.  Mild tenderness of the right wrist.  Mild thickening of  bilateral 2nd MCP joints.  No synovitis noted. Complete fist formation bilaterally.  Hip joints have good ROM with no groin pain.  Knee joints have good ROM with no warmth or effusion.  Ankle joints have good ROM with no tenderness or joint swelling.   CDAI Exam: CDAI Score: -- Patient Global: --; Provider Global: -- Swollen: --; Tender: -- Joint Exam 10/25/2023   No joint exam has been documented for this visit   There is currently no information documented on the homunculus. Go to the Rheumatology activity and complete the homunculus joint exam.  Investigation: No additional findings.  Imaging: No results found.  Recent Labs: Lab Results  Component Value Date   WBC 7.6 08/25/2023   HGB 13.1 08/25/2023   PLT 303 08/25/2023   NA 138 08/25/2023   K 4.3 08/25/2023   CL 99 08/25/2023   CO2 30 08/25/2023   GLUCOSE 106 (H) 08/25/2023   BUN 15 08/25/2023   CREATININE 0.75 08/25/2023   BILITOT 0.6 08/25/2023   ALKPHOS 78 07/28/2023   AST 18 08/25/2023   ALT 36 (H) 08/25/2023   PROT 6.7 08/25/2023   ALBUMIN  4.7 07/28/2023   CALCIUM 9.5 08/25/2023   GFRAA 86 01/07/2021   QFTBGOLDPLUS NEGATIVE 09/12/2018    Speciality Comments: TB Gold: 07/25/2022 Neg  Prior therapy: methotrexate, remicade, orencia, and enbrel (inadequate response), Simponi stopped Dec 2023, Actemra started 10/17/22-June 2024; Harriette Ohara liq started 03/27/23  Procedures:  No procedures performed Allergies: Other, Contrast media [iodinated contrast media], and Rosuvastatin     Assessment / Plan:     Visit Diagnoses: Rheumatoid arthritis involving multiple sites with positive rheumatoid factor (HCC): She has no synovitis on examination today.  About 1 month ago she had a flare involving the left second and third MCP joints with her since resolved.  She is currently taking low-dose Arava 10 mg by mouth daily and prednisone 5 mg daily.  Patient was started on Crestor around the same time as Arava and was experiencing significant diarrhea on a daily basis.  She discontinued Crestor about 1 and half weeks ago and her diarrhea has improved.  Lab work was obtained on 08/25/2023 which time her ALT was borderline elevated.  Plan to have the patient return in about 2 weeks to recheck lab work and if her liver enzymes have returned to normal we can discuss increasing the dose of Arava to 20 mg daily.  Discussed that the goal will be for her to taper off of prednisone completely once she is on the therapeutic dose of arava.  If she is able to increase Arava to 20 mg daily we will try reducing prednisone to 2.5 mg daily for 1 month then discontinue.  She will follow up in 3 months or sooner if needed.  High risk medication use - Arava 10 mg 1 tablet by mouth daily.  If labs are stable we can try increasing Arava to 20 mg daily. She will require close lab monitoring.  Discontinued xeljanz due to shingles outbreak and IR.  Previous therapy includes methotrexate, Remicade, Orencia, Enbrel, Simponi, Actemra, and Xeljanz. CBC and CMP updated on 08/25/23.   Patient return for updated lab work in 2 weeks (discontinued crestor 1.5 weeks  ago)--if LFTs returned to within normal limits plan to increase Arava to 20 mg daily.  She will require lab work 2 weeks after increasing the dose of Arava. Plan to reduce prednisone from 5 mg to 2.5 mg daily once she increases the dose of arava to 20 mg daily.    Chronic pain of both shoulders: She has good ROM on examination today.  Improved.    Primary osteoarthritis of both hands: No active inflammation currently.   Right ankle swelling: Resolved.  Other fatigue: Her energy level has improved since initiating Arava.  Sicca syndrome (HCC): Chronic dry eyes  History of vitamin D deficiency: She is taking a calcium and vitamin D supplement daily.  Age-related osteoporosis without current pathological fracture - hx of long term prednisone use. DEXA 12/03/20: The BMD measured at AP Spine L1-L2 is 0.851 g/cm2 with a T-score of -2.6.  Currently on Evenity monthly injections. Due to update DEXA in March 2024.  Other medical conditions are listed as follows:  History of anxiety  History of hypertension: Blood pressure was 122/74 today in the office.  Pulmonary nodule  History of shingles  History of bundle branch block  History of hyperlipidemia  Orders: No orders of the defined types were placed in this encounter.  No orders of the defined types were placed in this encounter.     Follow-Up Instructions: Return in about 3 months (around 01/23/2024) for Rheumatoid arthritis, Osteoarthritis.   Gearldine Bienenstock, PA-C  Note - This record has been created using Dragon software.  Chart creation errors have been sought, but may not always  have been located. Such creation errors do not reflect on  the standard of medical care.

## 2023-10-16 ENCOUNTER — Encounter: Payer: Self-pay | Admitting: Cardiovascular Disease

## 2023-10-16 ENCOUNTER — Other Ambulatory Visit: Payer: Self-pay | Admitting: Physician Assistant

## 2023-10-16 ENCOUNTER — Other Ambulatory Visit: Payer: Self-pay | Admitting: Cardiovascular Disease

## 2023-10-17 ENCOUNTER — Other Ambulatory Visit (HOSPITAL_COMMUNITY): Payer: Self-pay

## 2023-10-17 ENCOUNTER — Other Ambulatory Visit: Payer: Self-pay

## 2023-10-17 MED ORDER — VALSARTAN 80 MG PO TABS
80.0000 mg | ORAL_TABLET | Freq: Every day | ORAL | 3 refills | Status: AC
  Filled 2023-10-17: qty 90, 90d supply, fill #0
  Filled 2024-01-13: qty 90, 90d supply, fill #1
  Filled 2024-03-20 – 2024-04-11 (×2): qty 90, 90d supply, fill #2
  Filled 2024-07-15: qty 90, 90d supply, fill #3

## 2023-10-17 MED ORDER — PREDNISONE 5 MG PO TABS
5.0000 mg | ORAL_TABLET | Freq: Every day | ORAL | 0 refills | Status: DC
  Filled 2023-10-17: qty 30, 30d supply, fill #0

## 2023-10-17 MED ORDER — LEFLUNOMIDE 10 MG PO TABS
10.0000 mg | ORAL_TABLET | Freq: Every day | ORAL | 0 refills | Status: DC
  Filled 2023-10-17: qty 30, 30d supply, fill #0

## 2023-10-17 NOTE — Telephone Encounter (Signed)
 Last Fill: 09/05/2023 (Arava ), 08/25/2023 (Prednisone )  Labs: 08/25/2023 Glucose is 106. ALT remains borderline elevated.  AST WNL.  Continue on low dose of arava  10 mg 1 tablet by mouth daily.  Rest of CMP WNL.   CBC WNL  Next Visit: 10/25/2023  Last Visit: 08/25/2023  DX: Rheumatoid arthritis involving multiple sites with positive rheumatoid factor   Current Dose per office note 08/25/2023: Arava  10 mg 1 tablet by mouth daily. prednisone  5 mg 1 tablet daily for 1 month will be sent to the pharmacy. We would then try reducing prednisone  to 2.5 mg x 1 month then discontinue.   Spoke with patient and she tapered for about 2 days. Patient states she had a flare. Patient states she has been under increased stress. Patient states she is currently taking 5 mg.   Patient states she started Crestor  and Arava  around the same time. Patient states she started having diarrhea and tinnitus. Patient states she stopped her Crestor  about 1 week ago and her symptoms have resolved. Patient states she hopes that this will allow her LFTs return to normal.  Okay to refill Arava  ?

## 2023-10-23 ENCOUNTER — Other Ambulatory Visit: Payer: Self-pay

## 2023-10-24 ENCOUNTER — Other Ambulatory Visit (HOSPITAL_COMMUNITY): Payer: Self-pay

## 2023-10-24 NOTE — Progress Notes (Signed)
Specialty Pharmacy Refill Coordination Note  Sharon Hunter is a 63 y.o. female contacted today regarding refills of specialty medication(s) Romosozumab-aqqg Secretary/administrator)   Patient requested Courier to Provider Office   Delivery date: 10/31/23   Verified address: [address], [address] [facility])   Medication will be filled on 10/30/23.

## 2023-10-25 ENCOUNTER — Ambulatory Visit: Payer: Commercial Managed Care - PPO | Attending: Physician Assistant | Admitting: Physician Assistant

## 2023-10-25 ENCOUNTER — Encounter: Payer: Self-pay | Admitting: Physician Assistant

## 2023-10-25 VITALS — BP 122/74 | HR 65 | Resp 14 | Ht 67.0 in | Wt 209.0 lb

## 2023-10-25 DIAGNOSIS — M810 Age-related osteoporosis without current pathological fracture: Secondary | ICD-10-CM

## 2023-10-25 DIAGNOSIS — R5383 Other fatigue: Secondary | ICD-10-CM

## 2023-10-25 DIAGNOSIS — M19041 Primary osteoarthritis, right hand: Secondary | ICD-10-CM | POA: Diagnosis not present

## 2023-10-25 DIAGNOSIS — M3500 Sicca syndrome, unspecified: Secondary | ICD-10-CM | POA: Diagnosis not present

## 2023-10-25 DIAGNOSIS — Z8679 Personal history of other diseases of the circulatory system: Secondary | ICD-10-CM

## 2023-10-25 DIAGNOSIS — M25512 Pain in left shoulder: Secondary | ICD-10-CM

## 2023-10-25 DIAGNOSIS — R911 Solitary pulmonary nodule: Secondary | ICD-10-CM

## 2023-10-25 DIAGNOSIS — M19042 Primary osteoarthritis, left hand: Secondary | ICD-10-CM

## 2023-10-25 DIAGNOSIS — Z8639 Personal history of other endocrine, nutritional and metabolic disease: Secondary | ICD-10-CM

## 2023-10-25 DIAGNOSIS — Z8659 Personal history of other mental and behavioral disorders: Secondary | ICD-10-CM | POA: Diagnosis not present

## 2023-10-25 DIAGNOSIS — Z79899 Other long term (current) drug therapy: Secondary | ICD-10-CM

## 2023-10-25 DIAGNOSIS — M0579 Rheumatoid arthritis with rheumatoid factor of multiple sites without organ or systems involvement: Secondary | ICD-10-CM

## 2023-10-25 DIAGNOSIS — M25471 Effusion, right ankle: Secondary | ICD-10-CM

## 2023-10-25 DIAGNOSIS — G8929 Other chronic pain: Secondary | ICD-10-CM

## 2023-10-25 DIAGNOSIS — Z8619 Personal history of other infectious and parasitic diseases: Secondary | ICD-10-CM

## 2023-10-25 DIAGNOSIS — M25511 Pain in right shoulder: Secondary | ICD-10-CM

## 2023-10-25 NOTE — Patient Instructions (Signed)
 Standing Labs We placed an order today for your standing lab work.   Please have your standing labs drawn in 2 weeks   Please have your labs drawn 2 weeks prior to your appointment so that the provider can discuss your lab results at your appointment, if possible.  Please note that you may see your imaging and lab results in MyChart before we have reviewed them. We will contact you once all results are reviewed. Please allow our office up to 72 hours to thoroughly review all of the results before contacting the office for clarification of your results.  WALK-IN LAB HOURS  Monday through Thursday from 8:00 am -12:30 pm and 1:00 pm-5:00 pm and Friday from 8:00 am-12:00 pm.  Patients with office visits requiring labs will be seen before walk-in labs.  You may encounter longer than normal wait times. Please allow additional time. Wait times may be shorter on  Monday and Thursday afternoons.  We do not book appointments for walk-in labs. We appreciate your patience and understanding with our staff.   Labs are drawn by [facility]. Please bring your co-pay at the time of your lab draw.  You may receive a bill from [facility] for your lab work.  Please note if you are on Hydroxychloroquine and and an order has been placed for a Hydroxychloroquine level,  you will need to have it drawn 4 hours or more after your last dose.  If you wish to have your labs drawn at another location, please call the office 24 hours in advance so we can fax the orders.  The office is located at [address], [address], [place], [place] [address]   If you have any questions regarding directions or hours of operation,  please call [phone].   As a reminder, please drink plenty of water prior to coming for your lab work. Thanks!

## 2023-10-30 ENCOUNTER — Other Ambulatory Visit: Payer: Self-pay

## 2023-10-31 ENCOUNTER — Other Ambulatory Visit: Payer: Self-pay

## 2023-10-31 ENCOUNTER — Ambulatory Visit: Payer: Commercial Managed Care - PPO

## 2023-11-02 ENCOUNTER — Telehealth: Payer: Self-pay

## 2023-11-02 ENCOUNTER — Ambulatory Visit (INDEPENDENT_AMBULATORY_CARE_PROVIDER_SITE_OTHER): Payer: Commercial Managed Care - PPO

## 2023-11-02 ENCOUNTER — Other Ambulatory Visit (HOSPITAL_COMMUNITY): Payer: Self-pay

## 2023-11-02 DIAGNOSIS — M810 Age-related osteoporosis without current pathological fracture: Secondary | ICD-10-CM

## 2023-11-02 MED ORDER — ROMOSOZUMAB-AQQG 105 MG/1.17ML ~~LOC~~ SOSY: 210.0000 mg | PREFILLED_SYRINGE | Freq: Once | SUBCUTANEOUS | Status: DC

## 2023-11-02 MED ORDER — ROMOSOZUMAB-AQQG 105 MG/1.17ML ~~LOC~~ SOSY
210.0000 mg | PREFILLED_SYRINGE | SUBCUTANEOUS | Status: AC
  Administered 2023-11-02: 210 mg via SUBCUTANEOUS

## 2023-11-02 NOTE — Telephone Encounter (Signed)
Pt scheduled for 11/12 Evenity on 12/01/23

## 2023-11-02 NOTE — Telephone Encounter (Signed)
-----   Message from [facility] Windell Moulding S sent at 11/02/2023 11:59 AM EST ----- Evenity administered today. Patient scheduled for next one 12/01/23

## 2023-11-02 NOTE — Telephone Encounter (Signed)
Evenity VOB initiated via [email or web address]  Last Evenity inj: 11/02/23 Next Evenity inj DUE: 12/01/23

## 2023-11-02 NOTE — Progress Notes (Signed)
Sharon Hunter is a 63 y.o. female presents to the office today for evenity injections, per physician's orders. Original order: Per Dr. Chilton Greathouse (med), 2 x 105 mg/ 1.17 ml (dose),  subcutaneously  (route) was administered left and right arm (location) today. Patient tolerated injections.  Patient next injections due: in one month, appt made Yes for 12/01/23.   [place]

## 2023-11-06 ENCOUNTER — Other Ambulatory Visit (HOSPITAL_COMMUNITY): Payer: Self-pay

## 2023-11-06 NOTE — Telephone Encounter (Addendum)
Pt ready for scheduling for EVENITY on or after : 12/01/23  Out-of-pocket cost due at time of visit: $50  Number of injection/visits approved: 12  Primary: [employer] Prol[employer] co-insurance: $25 Admin fee co-insurance: $25  Secondary: --- Prolia co-insurance:  Admin fee co-insurance:   Medical Benefit Details: Date Benefits were checked: 11/06/23 Deductible: NO/ Coinsurance: $25/ Admin Fee: $25  Prior Auth: [id number] Expiration Date: 09/15/23-09/14/24 # of doses approved: 12 Pharmacy benefit: Copay $--- (REFILL TOO SOON, LAST FILLED 10/30/23, NEXT FILL 11/20/23) If patient wants fill through the pharmacy benefit please send prescription to:  --- , and include estimated need by date in rx notes. Pharmacy will ship medication directly to the office.  Patient IS eligible for Evenity Copay Card. Copay Card can make patient's cost as little as $25. Link to apply: https://www.amgensupportplus.com/copay  ** This summary of benefits is an estimation of the patient's out-of-pocket cost. Exact cost may very based on individual plan coverage.

## 2023-11-06 NOTE — Telephone Encounter (Signed)
 Marland Kitchen

## 2023-11-09 ENCOUNTER — Other Ambulatory Visit (HOSPITAL_COMMUNITY): Payer: Self-pay

## 2023-11-09 NOTE — Telephone Encounter (Signed)
 Pt scheduled for 12/01/23

## 2023-11-10 MED ORDER — ROMOSOZUMAB-AQQG 105 MG/1.17ML ~~LOC~~ SOSY
210.0000 mg | PREFILLED_SYRINGE | SUBCUTANEOUS | Status: AC
  Administered 2023-11-10: 210 mg via SUBCUTANEOUS

## 2023-11-13 ENCOUNTER — Other Ambulatory Visit: Payer: Self-pay | Admitting: *Deleted

## 2023-11-13 DIAGNOSIS — Z79899 Other long term (current) drug therapy: Secondary | ICD-10-CM | POA: Diagnosis not present

## 2023-11-14 ENCOUNTER — Other Ambulatory Visit (HOSPITAL_COMMUNITY): Payer: Self-pay

## 2023-11-14 ENCOUNTER — Other Ambulatory Visit: Payer: Self-pay | Admitting: *Deleted

## 2023-11-14 ENCOUNTER — Other Ambulatory Visit: Payer: Self-pay

## 2023-11-14 LAB — CBC WITH DIFFERENTIAL/PLATELET
Absolute Lymphocytes: 1195 {cells}/uL (ref 850–3900)
Absolute Monocytes: 647 {cells}/uL (ref 200–950)
Basophils Absolute: 42 {cells}/uL (ref 0–200)
Basophils Relative: 0.5 %
Eosinophils Absolute: 199 {cells}/uL (ref 15–500)
Eosinophils Relative: 2.4 %
HCT: 38.7 % (ref 35.0–45.0)
Hemoglobin: 12.9 g/dL (ref 11.7–15.5)
MCH: 31 pg (ref 27.0–33.0)
MCHC: 33.3 g/dL (ref 32.0–36.0)
MCV: 93 fL (ref 80.0–100.0)
MPV: 10.7 fL (ref 7.5–12.5)
Monocytes Relative: 7.8 %
Neutro Abs: 6217 {cells}/uL (ref 1500–7800)
Neutrophils Relative %: 74.9 %
Platelets: 310 10*3/uL (ref 140–400)
RBC: 4.16 10*6/uL (ref 3.80–5.10)
RDW: 11.8 % (ref 11.0–15.0)
Total Lymphocyte: 14.4 %
WBC: 8.3 10*3/uL (ref 3.8–10.8)

## 2023-11-14 LAB — COMPLETE METABOLIC PANEL WITH GFR
AG Ratio: 1.7 (calc) (ref 1.0–2.5)
ALT: 16 U/L (ref 6–29)
AST: 12 U/L (ref 10–35)
Albumin: 4.3 g/dL (ref 3.6–5.1)
Alkaline phosphatase (APISO): 61 U/L (ref 37–153)
BUN: 17 mg/dL (ref 7–25)
CO2: 28 mmol/L (ref 20–32)
Calcium: 9.4 mg/dL (ref 8.6–10.4)
Chloride: 101 mmol/L (ref 98–110)
Creat: 0.71 mg/dL (ref 0.50–1.05)
Globulin: 2.5 g/dL (ref 1.9–3.7)
Glucose, Bld: 103 mg/dL — ABNORMAL HIGH (ref 65–99)
Potassium: 4.1 mmol/L (ref 3.5–5.3)
Sodium: 137 mmol/L (ref 135–146)
Total Bilirubin: 0.4 mg/dL (ref 0.2–1.2)
Total Protein: 6.8 g/dL (ref 6.1–8.1)
eGFR: 96 mL/min/{1.73_m2} (ref >=60)

## 2023-11-14 MED ORDER — LEFLUNOMIDE 20 MG PO TABS
20.0000 mg | ORAL_TABLET | Freq: Every day | ORAL | 0 refills | Status: DC
  Filled 2023-11-14: qty 30, 30d supply, fill #0

## 2023-11-14 MED ORDER — PREDNISONE 5 MG PO TABS
2.5000 mg | ORAL_TABLET | Freq: Every day | ORAL | 0 refills | Status: DC
  Filled 2023-11-14: qty 15, 30d supply, fill #0

## 2023-11-14 NOTE — Telephone Encounter (Signed)
Last Fill: 10/17/2023   Labs: 11/13/2023 CBC and CMP are normal.   Next Visit: 01/23/2024  Last Visit: 10/25/2023   DX: Rheumatoid arthritis involving multiple sites with positive rheumatoid factor   Current Dose per office note 10/25/2023: If she is able to increase Arava to 20 mg daily we will try reducing prednisone to 2.5 mg daily for 1 month then discontinue   Okay to refill Arava and prednisone?

## 2023-11-14 NOTE — Progress Notes (Signed)
CBC and CMP are normal.

## 2023-11-16 ENCOUNTER — Other Ambulatory Visit (HOSPITAL_COMMUNITY): Payer: Self-pay

## 2023-11-17 ENCOUNTER — Other Ambulatory Visit: Payer: Self-pay

## 2023-11-17 ENCOUNTER — Other Ambulatory Visit: Payer: Self-pay | Admitting: Family Medicine

## 2023-11-17 DIAGNOSIS — M810 Age-related osteoporosis without current pathological fracture: Secondary | ICD-10-CM

## 2023-11-17 NOTE — Progress Notes (Signed)
Specialty Pharmacy Refill Coordination Note  Sharon Hunter is a 63 y.o. female contacted today regarding refills of specialty medication(s) Romosozumab-aqqg Sheilah Mins)   Patient requested Courier to Provider Office   Delivery date: 11/22/23   Verified address: [address] [facility] [place] [address]   Medication will be filled on 11/21/23.    This fill date is pending response to refill request from provider. Patient is aware and if they have not received fill by intended date they must follow up with pharmacy.

## 2023-11-22 ENCOUNTER — Other Ambulatory Visit: Payer: Self-pay | Admitting: Pharmacist

## 2023-11-22 ENCOUNTER — Other Ambulatory Visit: Payer: Self-pay

## 2023-11-22 ENCOUNTER — Other Ambulatory Visit (HOSPITAL_COMMUNITY): Payer: Self-pay

## 2023-11-22 DIAGNOSIS — M810 Age-related osteoporosis without current pathological fracture: Secondary | ICD-10-CM

## 2023-11-22 MED ORDER — EVENITY 105 MG/1.17ML ~~LOC~~ SOSY
PREFILLED_SYRINGE | SUBCUTANEOUS | 6 refills | Status: DC
  Filled 2023-11-22: qty 2.34, 28d supply, fill #0
  Filled 2023-12-21: qty 2.34, 28d supply, fill #1

## 2023-11-22 MED ORDER — EVENITY 105 MG/1.17ML ~~LOC~~ SOSY
PREFILLED_SYRINGE | SUBCUTANEOUS | 6 refills | Status: DC
  Filled 2023-11-22: qty 2.34, fill #0

## 2023-11-22 NOTE — Progress Notes (Signed)
11/22/23-CA: Evenity  Rescheduled to courier MDO on 02/25 due to inclement weather. Test claim $25 which is normal copay. Appointment is 02/28.

## 2023-11-27 ENCOUNTER — Encounter: Payer: Self-pay | Admitting: Family Medicine

## 2023-11-27 ENCOUNTER — Other Ambulatory Visit: Payer: Self-pay

## 2023-11-27 ENCOUNTER — Other Ambulatory Visit: Payer: Self-pay | Admitting: *Deleted

## 2023-11-27 ENCOUNTER — Other Ambulatory Visit: Payer: Self-pay | Admitting: Physician Assistant

## 2023-11-27 DIAGNOSIS — M810 Age-related osteoporosis without current pathological fracture: Secondary | ICD-10-CM

## 2023-11-27 MED ORDER — ROMOSOZUMAB-AQQG 105 MG/1.17ML ~~LOC~~ SOSY
210.0000 mg | PREFILLED_SYRINGE | Freq: Once | SUBCUTANEOUS | Status: AC
Start: 2023-11-27 — End: 2023-12-07
  Administered 2023-12-07: 210 mg via SUBCUTANEOUS

## 2023-11-28 ENCOUNTER — Other Ambulatory Visit: Payer: Self-pay

## 2023-11-28 ENCOUNTER — Other Ambulatory Visit (HOSPITAL_COMMUNITY): Payer: Self-pay

## 2023-11-28 DIAGNOSIS — Z0279 Encounter for issue of other medical certificate: Secondary | ICD-10-CM

## 2023-11-28 MED ORDER — PREDNISONE 5 MG PO TABS
5.0000 mg | ORAL_TABLET | Freq: Every day | ORAL | 0 refills | Status: DC
  Filled 2023-11-28 – 2023-11-29 (×3): qty 30, 30d supply, fill #0

## 2023-11-28 NOTE — Telephone Encounter (Signed)
 Last Fill: 11/14/2023  Next Visit: 01/23/2024  Last Visit: 10/25/2023  Dx: Rheumatoid arthritis involving multiple sites with positive rheumatoid factor   Current Dose per my chart message on 11/20/2023: ok to continue 5 mg daily for now.   Okay to refill Prednisone?

## 2023-11-29 ENCOUNTER — Other Ambulatory Visit (HOSPITAL_COMMUNITY): Payer: Self-pay

## 2023-11-30 ENCOUNTER — Other Ambulatory Visit: Payer: Self-pay | Admitting: *Deleted

## 2023-11-30 DIAGNOSIS — Z79899 Other long term (current) drug therapy: Secondary | ICD-10-CM

## 2023-12-01 ENCOUNTER — Ambulatory Visit: Payer: Commercial Managed Care - PPO

## 2023-12-01 LAB — COMPLETE METABOLIC PANEL WITH GFR
AG Ratio: 1.8 (calc) (ref 1.0–2.5)
ALT: 15 U/L (ref 6–29)
AST: 11 U/L (ref 10–35)
Albumin: 4.1 g/dL (ref 3.6–5.1)
Alkaline phosphatase (APISO): 58 U/L (ref 37–153)
BUN: 21 mg/dL (ref 7–25)
CO2: 27 mmol/L (ref 20–32)
Calcium: 9.1 mg/dL (ref 8.6–10.4)
Chloride: 103 mmol/L (ref 98–110)
Creat: 0.71 mg/dL (ref 0.50–1.05)
Globulin: 2.3 g/dL (ref 1.9–3.7)
Glucose, Bld: 98 mg/dL (ref 65–99)
Potassium: 3.7 mmol/L (ref 3.5–5.3)
Sodium: 138 mmol/L (ref 135–146)
Total Bilirubin: 0.5 mg/dL (ref 0.2–1.2)
Total Protein: 6.4 g/dL (ref 6.1–8.1)
eGFR: 96 mL/min/{1.73_m2} (ref >=60)

## 2023-12-01 LAB — CBC WITH DIFFERENTIAL/PLATELET
Absolute Lymphocytes: 1666 {cells}/uL (ref 850–3900)
Absolute Monocytes: 694 {cells}/uL (ref 200–950)
Basophils Absolute: 61 {cells}/uL (ref 0–200)
Basophils Relative: 0.9 %
Eosinophils Absolute: 197 {cells}/uL (ref 15–500)
Eosinophils Relative: 2.9 %
HCT: 38.3 % (ref 35.0–45.0)
Hemoglobin: 12.6 g/dL (ref 11.7–15.5)
MCH: 30.9 pg (ref 27.0–33.0)
MCHC: 32.9 g/dL (ref 32.0–36.0)
MCV: 93.9 fL (ref 80.0–100.0)
MPV: 10.3 fL (ref 7.5–12.5)
Monocytes Relative: 10.2 %
Neutro Abs: 4182 {cells}/uL (ref 1500–7800)
Neutrophils Relative %: 61.5 %
Platelets: 316 10*3/uL (ref 140–400)
RBC: 4.08 10*6/uL (ref 3.80–5.10)
RDW: 11.7 % (ref 11.0–15.0)
Total Lymphocyte: 24.5 %
WBC: 6.8 10*3/uL (ref 3.8–10.8)

## 2023-12-01 NOTE — Progress Notes (Deleted)
 Sharon Hunter is a 63 y.o. female presents to the office today for 11/12 Evenity injections, per physician's orders. Evenity 210 mg SQ , was administered *** arm today. Patient tolerated injection. Patient next injection due: 6 months, appt made: No- will schedule in 5 months after benifits are ran again Initial injection: no Patient supplied: no Patient next injection due: 1 month for 12/12 Evenity, appt made {yes/no:20286} CAM placed.    Creft, Feliberto Harts

## 2023-12-01 NOTE — Progress Notes (Signed)
 CBC and CMP normal

## 2023-12-07 ENCOUNTER — Ambulatory Visit

## 2023-12-07 ENCOUNTER — Other Ambulatory Visit (HOSPITAL_COMMUNITY): Payer: Self-pay

## 2023-12-07 ENCOUNTER — Other Ambulatory Visit: Payer: Self-pay

## 2023-12-07 ENCOUNTER — Other Ambulatory Visit: Payer: Self-pay | Admitting: Physician Assistant

## 2023-12-07 DIAGNOSIS — M810 Age-related osteoporosis without current pathological fracture: Secondary | ICD-10-CM

## 2023-12-07 MED ORDER — LEFLUNOMIDE 20 MG PO TABS
20.0000 mg | ORAL_TABLET | Freq: Every day | ORAL | 0 refills | Status: DC
  Filled 2023-12-07 – 2023-12-08 (×2): qty 90, 90d supply, fill #0

## 2023-12-07 NOTE — Telephone Encounter (Signed)
 Last Fill: 11/14/2023 (30 day supply)  Labs: 11/30/2023 CBC and CMP normal.   Next Visit: 01/23/2024  Last Visit: 10/25/2023  DX: Rheumatoid arthritis involving multiple sites with positive rheumatoid factor   Current Dose per office note 10/25/2023: Arava to 20 mg daily.   Okay to refill Arava ?

## 2023-12-07 NOTE — Progress Notes (Signed)
 Sharon Hunter is a 63 y.o. female presents to the office today for 11/12 Evenity injections, per physician's orders. Original order: Per Dr. Chilton Greathouse (med), 2 x 105 mg/ 1.17 ml (dose),  subcutaneously  (route) was administered left and right arm (location) today. Patient tolerated injections. Patient next injection due: 1  month, appt made:  YES Initial injection: NO Patient supplied: YES   Creft, Melton Alar L

## 2023-12-08 ENCOUNTER — Other Ambulatory Visit: Payer: Self-pay

## 2023-12-18 NOTE — Telephone Encounter (Signed)
 I would recommend evaluation by Dr. Corliss Skains to discuss treatment alternative.  An increased dose of prenisone can be used temporarily but we will need to discuss long term medication options to prevent recurrent flares.

## 2023-12-19 ENCOUNTER — Other Ambulatory Visit: Payer: Self-pay | Admitting: Cardiovascular Disease

## 2023-12-19 ENCOUNTER — Ambulatory Visit: Attending: Rheumatology | Admitting: Rheumatology

## 2023-12-19 ENCOUNTER — Other Ambulatory Visit: Payer: Self-pay | Admitting: Family Medicine

## 2023-12-19 ENCOUNTER — Other Ambulatory Visit: Payer: Self-pay

## 2023-12-19 ENCOUNTER — Other Ambulatory Visit (HOSPITAL_COMMUNITY): Payer: Self-pay

## 2023-12-19 ENCOUNTER — Encounter: Payer: Self-pay | Admitting: Rheumatology

## 2023-12-19 VITALS — BP 130/80 | HR 84 | Resp 14 | Ht 67.0 in | Wt 209.4 lb

## 2023-12-19 DIAGNOSIS — M19041 Primary osteoarthritis, right hand: Secondary | ICD-10-CM | POA: Diagnosis not present

## 2023-12-19 DIAGNOSIS — Z8639 Personal history of other endocrine, nutritional and metabolic disease: Secondary | ICD-10-CM

## 2023-12-19 DIAGNOSIS — F39 Unspecified mood [affective] disorder: Secondary | ICD-10-CM

## 2023-12-19 DIAGNOSIS — Z8659 Personal history of other mental and behavioral disorders: Secondary | ICD-10-CM

## 2023-12-19 DIAGNOSIS — M19042 Primary osteoarthritis, left hand: Secondary | ICD-10-CM

## 2023-12-19 DIAGNOSIS — Z79899 Other long term (current) drug therapy: Secondary | ICD-10-CM | POA: Diagnosis not present

## 2023-12-19 DIAGNOSIS — R5383 Other fatigue: Secondary | ICD-10-CM

## 2023-12-19 DIAGNOSIS — M810 Age-related osteoporosis without current pathological fracture: Secondary | ICD-10-CM | POA: Diagnosis not present

## 2023-12-19 DIAGNOSIS — M25511 Pain in right shoulder: Secondary | ICD-10-CM | POA: Diagnosis not present

## 2023-12-19 DIAGNOSIS — M3500 Sicca syndrome, unspecified: Secondary | ICD-10-CM

## 2023-12-19 DIAGNOSIS — M25512 Pain in left shoulder: Secondary | ICD-10-CM

## 2023-12-19 DIAGNOSIS — M25471 Effusion, right ankle: Secondary | ICD-10-CM | POA: Diagnosis not present

## 2023-12-19 DIAGNOSIS — Z8679 Personal history of other diseases of the circulatory system: Secondary | ICD-10-CM

## 2023-12-19 DIAGNOSIS — M0579 Rheumatoid arthritis with rheumatoid factor of multiple sites without organ or systems involvement: Secondary | ICD-10-CM

## 2023-12-19 DIAGNOSIS — G8929 Other chronic pain: Secondary | ICD-10-CM

## 2023-12-19 DIAGNOSIS — R911 Solitary pulmonary nodule: Secondary | ICD-10-CM

## 2023-12-19 DIAGNOSIS — Z8619 Personal history of other infectious and parasitic diseases: Secondary | ICD-10-CM

## 2023-12-19 DIAGNOSIS — Z7952 Long term (current) use of systemic steroids: Secondary | ICD-10-CM

## 2023-12-19 MED ORDER — PREDNISONE 5 MG PO TABS
5.0000 mg | ORAL_TABLET | Freq: Every day | ORAL | 0 refills | Status: DC
  Filled 2023-12-19 – 2023-12-25 (×2): qty 30, 30d supply, fill #0

## 2023-12-19 MED ORDER — SERTRALINE HCL 50 MG PO TABS
50.0000 mg | ORAL_TABLET | Freq: Every day | ORAL | 1 refills | Status: DC
Start: 2023-12-19 — End: 2024-04-23
  Filled 2023-12-19 – 2024-03-10 (×5): qty 90, 90d supply, fill #0

## 2023-12-19 NOTE — Patient Instructions (Addendum)
 Golimumab Injection What is this medication? GOLIMUMAB (goe LIM ue mab) treats autoimmune conditions, such as arthritis and ulcerative colitis. It works by slowing down an overactive immune system. It belongs to a group of medications called TNF inhibitors. It is a monoclonal antibody. This medicine may be used for other purposes; ask your health care provider or pharmacist if you have questions. COMMON BRAND NAME(S): Simponi, SIMPONI ARIA What should I tell my care team before I take this medication? They need to know if you have any of these conditions: Cancer Diabetes Guillain-Barre syndrome Heart failure Hepatitis B or history of hepatitis B infection Immune system problems Infection or history of infections Low blood counts, such as low white cell, platelet, or red cell counts Multiple sclerosis Psoriasis Recently received or scheduled to receive a vaccine Tuberculosis, a positive skin test for tuberculosis, or recent close contact with someone who has tuberculosis An unusual or allergic reaction to golimumab, other medications, latex, rubber, foods, dyes, or preservatives Pregnant or trying to get pregnant Breast-feeding How should I use this medication? This medication is injected into a vein or under the skin. It is usually given by your care team in a hospital or clinic setting. It may also be given at home. If you get this medication at home, you will be taught how to prepare and give it. Use exactly as directed. Take it as directed on the prescription label at the same time every day. Keep taking it unless your care team tells you to stop. It is important that you put your used injectors, needles, and syringes in a special sharps container. Do not put them in a trash can. If you do not have a sharps container, call your pharmacist or care team to get one. A special MedGuide will be given to you by the pharmacist with each prescription and refill. Be sure to read this information  carefully each time. If you get this medication in a hospital or clinic setting, a special MedGuide will be given to you before each treatment. Be sure to read this information carefully each time. Talk to your care team about the use of this medication in children. While it may be prescribed for children as young as 2 years for selected conditions, precautions do apply. Overdosage: If you think you have taken too much of this medicine contact a poison control center or emergency room at once. NOTE: This medicine is only for you. Do not share this medicine with others. What if I miss a dose? If you get this medication at the hospital or clinic: It is important not to miss your dose. Call your care team if you are unable to keep an appointment. If you give yourself this medication at home: If you miss a dose, take it as soon as you can. If it is almost time for your next dose, take only that dose. Do not take double or extra doses. Call your care team with questions. What may interact with this medication? Do not take this medication with any of the following: Abatacept Adalimumab Anakinra Certolizumab Etanercept Infliximab Live virus vaccines Rilonacept Rituximab Tocilizumab This medication may also interact with the following: Cyclosporine Theophylline Vaccines Warfarin This list may not describe all possible interactions. Give your health care provider a list of all the medicines, herbs, non-prescription drugs, or dietary supplements you use. Also tell them if you smoke, drink alcohol, or use illegal drugs. Some items may interact with your medicine. What should I watch for while  using this medication? Visit your care team for regular checks on your progress. Tell your care team if your symptoms do not start to get better or if they get worse. You will be tested for tuberculosis (TB) before you start this medication. If your care team prescribes any medication for TB, you should start  taking the TB medication before starting this medication. Make sure to finish the full course of TB medication. This medication may increase your risk of getting an infection. Call you care team if you get a fever, chills, sore throat, or other symptoms of a cold or flu. Do not treat yourself. Try to avoid being around people who are sick. Talk to your care team about your risk of cancer. You may be more at risk for certain types of cancers if you take this medication. What side effects may I notice from receiving this medication? Side effects that you should report to your care team as soon as possible: Allergic reactions--skin rash, itching, hives, swelling of the face, lips, tongue, or throat Aplastic anemia--unusual weakness or fatigue, dizziness, headache, trouble breathing, increased bleeding or bruising Body pain, tingling, or numbness Heart failure--shortness of breath, swelling of the ankles, feet, or hands, sudden weight gain, unusual weakness or fatigue Infection--fever, chills, cough, sore throat, wounds that don't heal, pain or trouble when passing urine, general feeling of discomfort or being unwell Lupus-like syndrome--joint pain, swelling, or stiffness, butterfly-shaped rash on the face, rashes that get worse in the sun, fever, unusual weakness or fatigue Unusual bruising or bleeding Side effects that usually do not require medical attention (report to your care team if they continue or are bothersome): Dizziness Fever Increase in blood pressure Runny or stuffy nose Sore throat This list may not describe all possible side effects. Call your doctor for medical advice about side effects. You may report side effects to FDA at 1-800-FDA-1088. Where should I keep my medication? Infusions will be given in a hospital or clinic. They will not be stored at home. Storage for syringes or injectors given under the skin and stored at home: Keep out of the reach of children and  pets. Refrigeration (preferred): Store in the refrigerator. Do not freeze. Do not shake. Keep this medication in the original container. Protect from light. Get rid of any unused medication after the expiration date. Room Temperature: This medication may be stored at room temperature between 20 and 25 degrees C (68 and 77 degrees F) for up to 30 days. Do not put the medication back in the refrigerator after it reaches room temperature. Keep this medication in the original container. Protect from light. Do not freeze. Do not shake. If it is stored at room temperature, get rid any unused medication after 30 days or after it expires, whichever comes first. To get rid of medications that are no longer needed or have expired: Take the medication to a medication take-back program. Check with your pharmacy or law enforcement to find a location. If you cannot return the medication, ask your pharmacist or care team how to get rid of this medication safely. NOTE: This sheet is a summary. It may not cover all possible information. If you have questions about this medicine, talk to your doctor, pharmacist, or health care provider.  2024 Elsevier/Gold Standard (2021-12-03 00:00:00)  Standing Labs We placed an order today for your standing lab work.   Please have your standing labs drawn in  1 month after Simponi Aria and then every 3 months  Please have your labs drawn 2 weeks prior to your appointment so that the provider can discuss your lab results at your appointment, if possible.  Please note that you may see your imaging and lab results in MyChart before we have reviewed them. We will contact you once all results are reviewed. Please allow our office up to 72 hours to thoroughly review all of the results before contacting the office for clarification of your results.  WALK-IN LAB HOURS  Monday through Thursday from 8:00 am -12:30 pm and 1:00 pm-5:00 pm and Friday from 8:00 am-12:00 pm.  Patients with  office visits requiring labs will be seen before walk-in labs.  You may encounter longer than normal wait times. Please allow additional time. Wait times may be shorter on  Monday and Thursday afternoons.  We do not book appointments for walk-in labs. We appreciate your patience and understanding with our staff.   Labs are drawn by [facility]. Please bring your co-pay at the time of your lab draw.  You may receive a bill from [facility] for your lab work.  Please note if you are on Hydroxychloroquine and and an order has been placed for a Hydroxychloroquine level,  you will need to have it drawn 4 hours or more after your last dose.  If you wish to have your labs drawn at another location, please call the office 24 hours in advance so we can fax the orders.  The office is located at [address], [place], [place] [address]   If you have any questions regarding directions or hours of operation,  please call [phone].   As a reminder, please drink plenty of water prior to coming for your lab work. Thanks!   Vaccines You are taking a medication(s) that can suppress your immune system.  The following immunizations are recommended: Flu annually Covid-19  RSV Td/Tdap (tetanus, diphtheria, pertussis) every 10 years Pneumonia (Prevnar 15 then Pneumovax 23 at least 1 year apart.  Alternatively, can take Prevnar 20 without needing additional dose) Shingrix: 2 doses from 4 weeks to 6 months apart  Please check with your PCP to make sure you are up to date. If you have signs or symptoms of an infection or start antibiotics: First, call your PCP for workup of your infection. Hold your medication through the infection, until you complete your antibiotics, and until symptoms resolve if you take the following: Injectable medication (Actemra, Benlysta, Cimzia, Cosentyx, Enbrel, Humira, Kevzara, Orencia, Remicade, Simponi, Stelara, Taltz, Tremfya) Methotrexate Leflunomide (Arava) Mycophenolate  (Cellcept) Harriette Ohara, Olumiant, or Rinvoq

## 2023-12-19 NOTE — Progress Notes (Signed)
 Office Visit Note  Patient: Sharon Hunter             Date of Birth: 1961/01/06           MRN: [id number]             PCP: Donato Schultz, DO Referring: Donato Schultz, * Visit Date: 12/19/2023 Occupation: @GUAROCC @  Subjective:  Pain and swelling in multiple joints  History of Present Illness: Sharon Hunter is a 63 y.o. female with seropositive rheumatoid arthritis and osteoarthritis.  She returns today after her last visit on October 25, 2023.  She states she continues to have flares every 4 to 5 days.  The last flare started couple of days ago.  She has been on prednisone 5 mg p.o. daily.  She increase the dose of prednisone to 10 mg p.o. daily with this recent flare.  She has been on leflunomide 10 mg p.o. daily since October 2024.  She increase the dose to 20 mg p.o. daily after her last labs in February.  She has been having pain and discomfort in her right shoulder, pain and swelling in her right wrist and right hand.  None of the other joints are painful or swollen today.  She had increased pain and discomfort last week in her hips which is resolved.  She has been taking Evenity injections on a regular basis for osteoporosis.    Activities of Daily Living:  Patient reports morning stiffness for all day.  Patient Reports nocturnal pain.  Difficulty dressing/grooming: Reports Difficulty climbing stairs: Reports Difficulty getting out of chair: Reports Difficulty using hands for taps, buttons, cutlery, and/or writing: Reports  Review of Systems  Constitutional:  Positive for fatigue.  HENT:  Positive for mouth dryness. Negative for mouth sores.   Eyes:  Positive for dryness.  Respiratory:  Negative for shortness of breath.   Cardiovascular:  Negative for chest pain and palpitations.  Gastrointestinal:  Negative for blood in stool, constipation and diarrhea.  Endocrine: Negative for increased urination.  Genitourinary:  Negative for involuntary urination.   Musculoskeletal:  Positive for joint pain, gait problem, joint pain, joint swelling, myalgias, muscle weakness, morning stiffness, muscle tenderness and myalgias.  Skin:  Negative for color change, rash, hair loss and sensitivity to sunlight.  Allergic/Immunologic: Negative for susceptible to infections.  Neurological:  Negative for dizziness and headaches.  Hematological:  Negative for swollen glands.  Psychiatric/Behavioral:  Positive for sleep disturbance. Negative for depressed mood. The patient is nervous/anxious.     PMFS History:  Patient Active Problem List   Diagnosis Date Noted   Acute otitis media 08/11/2023   Mood disorder (HCC)- Emotional Eating 08/02/2022   Class 1 obesity with serious comorbidity and body mass index (BMI) of 30.0 to 30.9 in adult 08/02/2022   At risk for impaired metabolic function 05/30/2022   Perioral dermatitis 05/26/2022   Preventative health care 05/26/2022   Essential hypertension 05/26/2022   Abnormal EKG 05/26/2022   Prediabetes 03/09/2022   Vitamin B12 deficiency 03/09/2022   Primary hypertension 02/22/2022   Hyperlipidemia 02/22/2022   GAD (generalized anxiety disorder) 02/22/2022   Vitamin D deficiency 02/22/2022   Ear pain, left 08/13/2021   History of anxiety 03/27/2017   History of hypertension 03/27/2017   History of bundle branch block 03/27/2017   Primary osteoarthritis of both hands 12/22/2016   History of shingles 08/02/2016   High risk medication use 08/02/2016   Hypertension 08/01/2016   IBS (irritable bowel  syndrome) 08/01/2016   Anxiety 08/01/2016   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   LBBB (left bundle branch block) 01/30/2015   Palpitations 01/30/2015   Pulmonary nodule 07/04/2011   Murmur 02/03/2011   Rheumatoid arthritis (HCC) 02/03/2011   Hair loss 02/03/2011   Fatigue 02/03/2011    Past Medical History:  Diagnosis Date   Anxiety    Breast lump    stable-- gets annual mammogram   History of  primary ovarian failure 08/01/2016   Hypertension    IBS (irritable bowel syndrome) 08/01/2016   Joint pain    Lactose intolerance    Migraine    Murmur 02/03/2011   Obese    Ovarian failure    Palpitation    Pulmonary nodule, left 2009   last CT Nov 2011-- due 08/2011   Rheumatoid arthritis(714.0)    in remission , off meds as of Summer 2012   Shingles 08/2023   Vitamin D deficiency     Family History  Problem Relation Age of Onset   Cancer Mother    Arthritis Mother    Uterine cancer Mother    Hypertension Mother    Diabetes Mother    Esophageal cancer Mother    Alcohol abuse Father    Hyperlipidemia Father    Heart disease Father    High blood pressure Father    Sleep apnea Father    Uterine cancer Sister    Heart disease Sister        cardiac stent   Diabetes Sister    Hyperlipidemia Sister    Hypertension Sister    Stroke Sister    Alcohol abuse Brother    Diabetes Brother    Hypertension Brother    Cancer Brother        lung   Cancer Maternal Aunt        lung----non smoker   Past Surgical History:  Procedure Laterality Date   CHOLECYSTECTOMY  1999   COLONOSCOPY  10/31/11   EXPLORATORY LAPAROTOMY     TONSILLECTOMY  1969   WISDOM TOOTH EXTRACTION     Social History   Social History Narrative   Exercise-- jogs   Immunization History  Administered Date(s) Administered   Influenza Split 07/03/2013   Influenza,inj,Quad PF,6+ Mos 07/01/2023   Influenza,inj,Quad PF,6-35 Mos 07/17/2020   Influenza-Unspecified 07/03/2014, 07/04/2015   PFIZER(Purple Top)SARS-COV-2 Vaccination 01/13/2020, 05/15/2020   Tdap 12/01/2009     Objective: Vital Signs: BP 130/80 (BP Location: Left Arm, Patient Position: Sitting, Cuff Size: Normal)   Pulse 84   Resp 14   Ht 5\' 7"  (1.702 m)   Wt 209 lb 6.4 oz (95 kg)   LMP 10/03/2005   BMI 32.80 kg/m    Physical Exam Vitals and nursing note reviewed.  Constitutional:      Appearance: She is well-developed.  HENT:      Head: Normocephalic and atraumatic.  Eyes:     Conjunctiva/sclera: Conjunctivae normal.  Cardiovascular:     Rate and Rhythm: Normal rate and regular rhythm.     Heart sounds: Normal heart sounds.  Pulmonary:     Effort: Pulmonary effort is normal.     Breath sounds: Normal breath sounds.  Abdominal:     General: Bowel sounds are normal.     Palpations: Abdomen is soft.  Musculoskeletal:     Cervical back: Normal range of motion.  Lymphadenopathy:     Cervical: No cervical adenopathy.  Skin:    General: Skin is warm and dry.  Capillary Refill: Capillary refill takes less than 2 seconds.  Neurological:     Mental Status: She is alert and oriented to person, place, and time.  Psychiatric:        Behavior: Behavior normal.      Musculoskeletal Exam: Cervical spine with good range of motion.  Right shoulder abduction and forward flexion was limited to 30 degrees.  She had limited internal rotation.  Left shoulder joint was in full range of motion.  Elbow joints and wrist joints in good range of motion.  She had tenderness send mild synovitis of her right wrist joint.  She has synovitis in several MCPs and PIPs as described below.  She had warmth on palpation of bilateral knee joints.  All the joints and full range of motion without synovitis.  CDAI Exam: CDAI Score: 30  Patient Global: 90 / 100; Provider Global: 60 / 100 Swollen: 6 ; Tender: 9  Joint Exam 12/19/2023      Right  Left  Glenohumeral   Tender     Wrist  Swollen Tender     MCP 2  Swollen Tender     MCP 3  Swollen Tender     MCP 5  Swollen Tender     PIP 4 (finger)  Swollen Tender     PIP 5 (finger)  Swollen Tender     Knee   Tender   Tender     Investigation: No additional findings.  Imaging: No results found.  Recent Labs: Lab Results  Component Value Date   WBC 6.8 11/30/2023   HGB 12.6 11/30/2023   PLT 316 11/30/2023   NA 138 11/30/2023   K 3.7 11/30/2023   CL 103 11/30/2023   CO2 27  11/30/2023   GLUCOSE 98 11/30/2023   BUN 21 11/30/2023   CREATININE 0.71 11/30/2023   BILITOT 0.5 11/30/2023   ALKPHOS 78 07/28/2023   AST 11 11/30/2023   ALT 15 11/30/2023   PROT 6.4 11/30/2023   ALBUMIN 4.7 07/28/2023   CALCIUM 9.1 11/30/2023   GFRAA 86 01/07/2021   QFTBGOLDPLUS NEGATIVE 09/12/2018    Speciality Comments: TB Gold: 07/25/2022 Neg  Prior therapy: methotrexate, remicade, orencia, and enbrel (inadequate response), Simponi stopped Dec 2023, Actemra started 10/17/22-June 2024; Harriette Ohara liq started 03/27/23  Procedures:  No procedures performed Allergies: Other, Contrast media [iodinated contrast media], and Rosuvastatin   Assessment / Plan:     Visit Diagnoses: Rheumatoid arthritis involving multiple sites with positive rheumatoid factor (HCC)-patient reports having recurrent flare for many months.  She states she gets flares about 4-5 times a month.  She had recent flare about couple of days ago.  She states she stays on prednisone 5 mg a day and she increased prednisone dose to 10 mg a day recently.  She had been taking Arava 20 mg p.o. daily for the last month.  She has not noticed much improvement.  We had a detailed discussion regarding different treatment options and their side effects.  She is taken several medications in the past including Remicade IV, Orencia, Enbrel, Simponi, Actemra and Xeljanz.  She had an inadequate response to almost all of these medications.  I discussed trying a combination therapy of Areva 10 mg p.o. daily along with Simponi Aria infusions.  Patient was in agreement.  Side effects were discussed at length.  A handout was given and consent was taken.  Patient is quite frustrated about ongoing swelling and chronic discomfort.  She is considering applying for disability in  the future.  Medication counseling:   TB Gold was negative on July 28, 2023.  He  Does patient have diagnosis of heart failure?  No  Counseled patient that Simponi Jarold Song is  a TNF blocking agent.  Reviewed Simponi Aria dose of 2 mg/kg at weeks 0, 4, and then every 8 weeks thereafter.  Counseled patient on purpose, proper use, and adverse effects of Simponi.  Reviewed the most common adverse effects including infections and infusion reactions.  Discussed that there is the possibility of an increased risk of malignancy but it is not well understood if this increased risk is due to the medication or the disease state.  Advised patient to get yearly dermatology exams due to risk of skin cancer.  Reviewed the importance of regular labs while on Simponi therapy.  Counseled patient that Simponi should be held prior to scheduled surgery.  Counseled patient to avoid live vaccines while on Simponi.  Advised patient to get annual influenza vaccine and the pneumococcal vaccine as indicated.  Provided patient with medication education material and answered all questions.  Patient voiced understanding.  Patient consented to Simponi.  Will upload consent into the media tab.  Will submit benefits investigation for Simponi Aria.     High risk medication use - Arava 20mg  by mouth daily.D/d Harriette Ohara due to shingles outbreak and inadequate response. Previous therapy: methotrexate, Remicade, Orencia, Enbrel, Simponi, Actemra, Xeljanz.  November 30, 2023 CBC and CMP were normal.  TB Gold was negative on July 28, 2023.  Patient was advised to get labs 1 month after the infusion and then every 3 months.  Information on immunization was placed in the AVS.  She was advised to hold Simponi Aria infusions and leflunomide if she develops an infection resume after the infection resolves.  Long-term use of systemic steroids-patient has been on prednisone 5 mg p.o. daily since October 07, 2023.  She is unable to taper prednisone.  We may be able to taper prednisone after starting Simponi Aria infusion.  I will keep her on prednisone 5 mg p.o. daily for now.  Will check hemoglobin A1c with next labs.  Side effects  of prednisone including weight gain, hypertension, diabetes, cataracts, osteoporosis and increased risk of heart disease were discussed.  Chronic pain of both shoulders- she has chronic discomfort in her shoulders.  She had limited range of motion of right shoulder joint with abduction limited to about 30 degrees.  Primary osteoarthritis of both hands-she has rheumatoid arthritis and osteoarthritis overlap.  She has synovitis in multiple MCPs and PIPs as described above.  She also had some swelling over her right wrist.  Right ankle swelling - Resolved.  Other fatigue-she could history of ongoing fatigue.  Sicca syndrome (HCC)-over-the-counter products were discussed.  History of vitamin D deficiency-vitamin D was 30 on November 28, 2022.  She has been taking vitamin D supplement.  Age-related osteoporosis without current pathological fracture - hx of long term prednisone use. DEXA 12/03/20: The BMD measured at [facility] L1-L2 is 0.851 g/cm2 with a T-score of -2.6.  Currently on Evenity monthly injections.  History of anxiety  History of hypertension-pressure was normal at 130/80.  Pulmonary nodule  History of bundle branch block  History of shingles-patient plans to get shingles vaccine.  Information immunization was placed in the AVS.  History of hyperlipidemia  Orders: Orders Placed This Encounter  Procedures   Hemoglobin A1c   Meds ordered this encounter  Medications   predniSONE (DELTASONE) 5 MG tablet  Sig: Take 1 tablet (5 mg total) by mouth daily.    Dispense:  30 tablet    Refill:  0    Okay to refill early.     Follow-Up Instructions: Return in about 2 months (around 02/18/2024) for Rheumatoid arthritis, Osteoarthritis, Osteoporosis.   Pollyann Savoy, MD  Note - This record has been created using Animal nutritionist.  Chart creation errors have been sought, but may not always  have been located. Such creation errors do not reflect on  the standard of medical  care.

## 2023-12-19 NOTE — Telephone Encounter (Signed)
 I called patient, appt 12/19/2023, patient is having a flare.

## 2023-12-20 ENCOUNTER — Other Ambulatory Visit: Payer: Self-pay | Admitting: Physician Assistant

## 2023-12-20 ENCOUNTER — Other Ambulatory Visit: Payer: Self-pay

## 2023-12-20 ENCOUNTER — Other Ambulatory Visit (HOSPITAL_COMMUNITY): Payer: Self-pay

## 2023-12-21 ENCOUNTER — Other Ambulatory Visit (HOSPITAL_COMMUNITY): Payer: Self-pay

## 2023-12-21 ENCOUNTER — Encounter: Payer: Self-pay | Admitting: Rheumatology

## 2023-12-21 ENCOUNTER — Telehealth: Payer: Self-pay | Admitting: Pharmacist

## 2023-12-21 ENCOUNTER — Other Ambulatory Visit: Payer: Self-pay

## 2023-12-21 MED ORDER — HYDROCHLOROTHIAZIDE 25 MG PO TABS
12.5000 mg | ORAL_TABLET | Freq: Every day | ORAL | 1 refills | Status: DC
  Filled 2023-12-21: qty 45, 90d supply, fill #0
  Filled 2024-03-24: qty 45, 90d supply, fill #1

## 2023-12-21 NOTE — Progress Notes (Signed)
 Specialty Pharmacy Refill Coordination Note  Sharon Hunter is a 63 y.o. female contacted today regarding refills of specialty medication(s) Romosozumab-aqqg Secretary/administrator)   Patient requested Courier to Provider Office   Delivery date: 01/08/24   Verified address: [address] [facility] [place] [address]   Medication will be filled on 01/05/24.

## 2023-12-21 NOTE — Telephone Encounter (Addendum)
 Please start Simponi Aria [phone]) BIV through medical benefit.  Dose: 2mg /kg at Weeks 0, 4 then every 8 weeks thereafter  Dx: Rheumatoid arthritis (M05.9)  Previously tried therapies: Methotrexate Azathioprine Remicade Orencia Enbrel (inadequate response) Simponi stopped Dec 2023 Actemra started 10/17/22-June 2024 Harriette Ohara liq started 03/27/23  Current regimen: leflunomide 20mg  mg  Chesley Mires, PharmD, MPH, BCPS, CPP Clinical Pharmacist (Rheumatology and Pulmonology)   ----- Message from [facility] Marissa G sent at 12/19/2023  4:43 PM EDT ----- Please apply for Simponi Aria IV per Dr. Corliss Skains. Thanks!   Consent obtained and sent to the scan center. Patient prefers [facility] for infusion location.

## 2023-12-25 ENCOUNTER — Other Ambulatory Visit (HOSPITAL_COMMUNITY): Payer: Self-pay

## 2023-12-25 ENCOUNTER — Other Ambulatory Visit: Payer: Self-pay

## 2023-12-26 ENCOUNTER — Other Ambulatory Visit: Payer: Self-pay

## 2023-12-26 ENCOUNTER — Other Ambulatory Visit: Payer: Self-pay | Admitting: Physician Assistant

## 2023-12-28 NOTE — Telephone Encounter (Signed)
 Called [employer] for precertification for Sharon Hunter [id number] but their online teleprompt system is down. Will need to use [employer] portal (I do not have a login). Routing to pharmacy pool for urgent submission

## 2023-12-28 NOTE — Telephone Encounter (Signed)
 Is advised patient to reduce the dose of Arava.  We have contacted the pharmacy team again regarding the prior Auth.

## 2023-12-29 ENCOUNTER — Other Ambulatory Visit: Payer: Self-pay

## 2023-12-29 ENCOUNTER — Emergency Department (HOSPITAL_BASED_OUTPATIENT_CLINIC_OR_DEPARTMENT_OTHER)
Admission: EM | Admit: 2023-12-29 | Discharge: 2023-12-29 | Disposition: A | Discharge status: Home / Self Care | Attending: Emergency Medicine | Admitting: Emergency Medicine

## 2023-12-29 ENCOUNTER — Encounter (HOSPITAL_BASED_OUTPATIENT_CLINIC_OR_DEPARTMENT_OTHER): Payer: Self-pay | Admitting: *Deleted

## 2023-12-29 DIAGNOSIS — R519 Headache, unspecified: Secondary | ICD-10-CM | POA: Diagnosis not present

## 2023-12-29 DIAGNOSIS — R6883 Chills (without fever): Secondary | ICD-10-CM | POA: Insufficient documentation

## 2023-12-29 DIAGNOSIS — R197 Diarrhea, unspecified: Secondary | ICD-10-CM | POA: Diagnosis not present

## 2023-12-29 DIAGNOSIS — E860 Dehydration: Secondary | ICD-10-CM | POA: Insufficient documentation

## 2023-12-29 LAB — COMPREHENSIVE METABOLIC PANEL WITH GFR
ALT: 29 U/L (ref 0–44)
AST: 23 U/L (ref 15–41)
Albumin: 3.5 g/dL (ref 3.5–5.0)
Alkaline Phosphatase: 59 U/L (ref 38–126)
Anion gap: 15 (ref 5–15)
BUN: 15 mg/dL (ref 8–23)
CO2: 18 mmol/L — ABNORMAL LOW (ref 22–32)
Calcium: 8.7 mg/dL — ABNORMAL LOW (ref 8.9–10.3)
Chloride: 100 mmol/L (ref 98–111)
Creatinine, Ser: 0.76 mg/dL (ref 0.44–1.00)
GFR, Estimated: >60 mL/min (ref >60)
Glucose, Bld: 116 mg/dL — ABNORMAL HIGH (ref 70–99)
Potassium: 2.8 mmol/L — ABNORMAL LOW (ref 3.5–5.1)
Sodium: 133 mmol/L — ABNORMAL LOW (ref 135–145)
Total Bilirubin: 0.5 mg/dL (ref 0.0–1.2)
Total Protein: 7.1 g/dL (ref 6.5–8.1)

## 2023-12-29 LAB — CBC
HCT: 41.1 % (ref 36.0–46.0)
Hemoglobin: 14.2 g/dL (ref 12.0–15.0)
MCH: 30.5 pg (ref 26.0–34.0)
MCHC: 34.5 g/dL (ref 30.0–36.0)
MCV: 88.4 fL (ref 80.0–100.0)
Platelets: 237 10*3/uL (ref 150–400)
RBC: 4.65 MIL/uL (ref 3.87–5.11)
RDW: 12.8 % (ref 11.5–15.5)
WBC: 5.4 10*3/uL (ref 4.0–10.5)
nRBC: 0 % (ref 0.0–0.2)

## 2023-12-29 LAB — C DIFFICILE QUICK SCREEN W PCR REFLEX
C Diff antigen: NEGATIVE
C Diff interpretation: NOT DETECTED
C Diff toxin: NEGATIVE

## 2023-12-29 LAB — LIPASE, BLOOD: Lipase: 28 U/L (ref 11–51)

## 2023-12-29 LAB — MAGNESIUM: Magnesium: 1.8 mg/dL (ref 1.7–2.4)

## 2023-12-29 MED ORDER — ONDANSETRON HCL 4 MG/2ML IJ SOLN
4.0000 mg | Freq: Once | INTRAMUSCULAR | Status: AC
  Administered 2023-12-29: 4 mg via INTRAVENOUS
  Filled 2023-12-29: qty 2

## 2023-12-29 MED ORDER — SODIUM CHLORIDE 0.9 % IV BOLUS
1000.0000 mL | Freq: Once | INTRAVENOUS | Status: AC
  Administered 2023-12-29: 1000 mL via INTRAVENOUS

## 2023-12-29 MED ORDER — POTASSIUM CHLORIDE CRYS ER 20 MEQ PO TBCR: 40.0000 meq | EXTENDED_RELEASE_TABLET | Freq: Once | ORAL | Status: DC

## 2023-12-29 MED ORDER — LOPERAMIDE HCL 2 MG PO CAPS
2.0000 mg | ORAL_CAPSULE | Freq: Once | ORAL | Status: AC
Start: 2023-12-29 — End: 2023-12-29
  Administered 2023-12-29: 2 mg via ORAL
  Filled 2023-12-29: qty 1

## 2023-12-29 MED ORDER — POTASSIUM CHLORIDE 20 MEQ PO PACK
40.0000 meq | PACK | Freq: Once | ORAL | Status: AC
Start: 2023-12-29 — End: 2023-12-29
  Administered 2023-12-29: 40 meq via ORAL
  Filled 2023-12-29: qty 2

## 2023-12-29 MED ORDER — ACETAMINOPHEN 325 MG PO TABS
650.0000 mg | ORAL_TABLET | Freq: Once | ORAL | Status: AC
  Administered 2023-12-29: 650 mg via ORAL
  Filled 2023-12-29: qty 2

## 2023-12-29 NOTE — ED Provider Notes (Addendum)
 [facility] Provider Note   CSN: [id number] Arrival date & time: 12/29/23  1914     History  Chief Complaint  Patient presents with   Diarrhea    Sharon Hunter is a 63 y.o. female.  Patient is a 63 year old female with past medical history of IBS and rheumatoid arthritis on Arava presenting for complaints of diarrhea.  Patient admits to diffuse watery diarrhea x 3 days with bowel movements every time she eats.  She admits to feelings of dehydration including dry mouth, headache, fatigue, and dry skin.  She admits to chills but denies any subjective fevers or recorded temperatures.  She admits to diffuse abdominal cramping before diarrhea but otherwise denies any specific abdominal pain.  She denies any black or bloody stools.  Denies foreign travel.  Denies new or suspicious food intake.  Denies sick contacts.  Denies history of C. difficile or recent antibiotic use.  States she has been in and out of a hospital/nursing home lately.  Patient attributes her symptoms to recent dosing change and Arava states she has had diarrhea in the past with this medication dose changes.  The history is provided by the patient. No language interpreter was used.  Diarrhea Associated symptoms: chills   Associated symptoms: no abdominal pain, no arthralgias, no fever and no vomiting        Home Medications Prior to Admission medications   Medication Sig Start Date End Date Taking? Authorizing Provider  amLODipine (NORVASC) 5 MG tablet Take 1 tablet (5 mg total) by mouth daily. 06/06/23   Donato Schultz, DO  Calcium-Magnesium (CAL-MAG PO) Take by mouth daily.    [provider]  Cholecalciferol 125 MCG (5000 UT) CHEW Chew 1 tablet by mouth daily at 6 (six) AM. Patient taking differently: Chew 1 tablet by mouth daily at 6 (six) AM. With vitamin K 06/29/22   Opalski, Deborah, DO  diclofenac Sodium (VOLTAREN) 1 % GEL Apply 2-4 grams to affected  area up to 4 times daily. 01/28/22   Gearldine Bienenstock, PA-C  Doxylamine Succinate, Sleep, (UNISOM PO) Take 1 tablet by mouth at bedtime.    [provider]  hydrochlorothiazide (HYDRODIURIL) 25 MG tablet Take 0.5 tablets (12.5 mg total) by mouth daily. 12/21/23   Nahser, Deloris Ping, MD  Ibuprofen (MOTRIN PO) Take by mouth as needed.    [provider]  leflunomide (ARAVA) 20 MG tablet Take 1 tablet (20 mg total) by mouth daily. 12/07/23   Gearldine Bienenstock, PA-C  MELATONIN PO Take 5-10 mg by mouth at bedtime.    [provider]  Multiple Vitamin (MULTIVITAMIN PO) Take by mouth daily.    [provider]  predniSONE (DELTASONE) 5 MG tablet Take 1 tablet (5 mg total) by mouth daily. 12/19/23   Pollyann Savoy, MD  Romosozumab-aqqg (EVENITY) 105 MG/1. SOSY injection To be given at office. 11/22/23   Quentin Angst, MD  sertraline (ZOLOFT) 50 MG tablet Take 1 tablet (50 mg total) by mouth daily. 12/19/23   Donato Schultz, DO  valsartan (DIOVAN) 80 MG tablet Take 1 tablet (80 mg total) by mouth daily. 10/17/23   Nahser, Deloris Ping, MD      Allergies    Other, Contrast media [iodinated contrast media], and Rosuvastatin    Review of Systems   Review of Systems  Constitutional:  Positive for chills and fatigue. Negative for fever.  HENT:  Negative for ear pain and sore  throat.   Eyes:  Negative for pain and visual disturbance.  Respiratory:  Negative for cough and shortness of breath.   Cardiovascular:  Negative for chest pain and palpitations.  Gastrointestinal:  Positive for diarrhea and nausea. Negative for abdominal pain and vomiting.  Genitourinary:  Negative for dysuria and hematuria.  Musculoskeletal:  Negative for arthralgias and back pain.  Skin:  Negative for color change and rash.  Neurological:  Negative for seizures and syncope.  All other systems reviewed and are negative.   Physical Exam Updated Vital Signs BP (!) 156/97   Pulse 80   Temp  98.6 F (37 C) (Oral)   Resp 16   LMP 10/03/2005   SpO2 99%  Physical Exam Vitals and nursing note reviewed.  Constitutional:      General: She is not in acute distress.    Appearance: She is well-developed.  HENT:     Head: Normocephalic and atraumatic.     Mouth/Throat:     Mouth: Mucous membranes are dry.  Eyes:     Conjunctiva/sclera: Conjunctivae normal.  Cardiovascular:     Rate and Rhythm: Normal rate and regular rhythm.     Heart sounds: No murmur heard. Pulmonary:     Effort: Pulmonary effort is normal. No respiratory distress.     Breath sounds: Normal breath sounds.  Abdominal:     Palpations: Abdomen is soft.     Tenderness: There is no abdominal tenderness.  Musculoskeletal:        General: No swelling.     Cervical back: Neck supple.  Skin:    General: Skin is warm and dry.     Capillary Refill: Capillary refill takes less than 2 seconds.     Comments: Dry skin/skin tenting  Neurological:     Mental Status: She is alert.  Psychiatric:        Mood and Affect: Mood normal.     ED Results / Procedures / Treatments   Labs (all labs ordered are listed, but only abnormal results are displayed) Labs Reviewed  COMPREHENSIVE METABOLIC PANEL WITH GFR - Abnormal; Notable for the following components:      Result Value   Sodium 133 (*)    Potassium 2.8 (*)    CO2 18 (*)    Glucose, Bld 116 (*)    Calcium 8.7 (*)    All other components within normal limits  C DIFFICILE QUICK SCREEN W PCR REFLEX    GASTROINTESTINAL PANEL BY PCR, STOOL (REPLACES STOOL CULTURE)  LIPASE, BLOOD  CBC  MAGNESIUM  URINALYSIS, ROUTINE W REFLEX MICROSCOPIC    EKG None  Radiology No results found.  Procedures Procedures    Medications Ordered in ED Medications  acetaminophen (TYLENOL) tablet 650 mg (has no administration in time range)  sodium chloride 0.9 % bolus 1,000 mL (0 mLs Intravenous Stopped 12/29/23 0841)  ondansetron (ZOFRAN) injection 4 mg (4 mg Intravenous  Given 12/29/23 0758)  loperamide (IMODIUM) capsule 2 mg (2 mg Oral Given 12/29/23 0758)  potassium chloride (KLOR-CON) packet 40 mEq (40 mEq Oral Given 12/29/23 1008)    ED Course/ Medical Decision Making/ A&P                                 Medical Decision Making Amount and/or Complexity of Data Reviewed Labs: ordered.  Risk OTC drugs. Prescription drug management.   37:31 AM  63 year old female with past medical history of IBS  and rheumatoid arthritis on Arava presenting for complaints of diarrhea.  Patient is nontoxic-appearing, afebrile, so vital signs.  Abdomen is soft and nontender.  Signs of clinical dehydration with dry mouth, dry skin, and skin tenting.  IV fluids initiated as well as Zofran for nausea, and Imodium for diarrhea.  Frontal diagnosis includes but is not limited to C. difficile colitis, diverticulitis, IBS, gastroenteritis, side effect of dosing changes from of Avara, ect.   Laboratory studies demonstrate some hypokalemia and hyponatremia.  Fluids given was NaCl.  Oral potassium given in ED.  Patient's diarrhea stopped with Imodium.  Abdomen remains soft and nontender.  Stool samples are pending.  I told her I will call her with the results.  CT abdomen not ordered at this time due to soft nontender abdomen.  Patient is recommended though to return if symptoms worsen in any way.  Patient in no distress and overall condition improved here in the ED. Detailed discussions were had with the patient regarding current findings, and need for close f/u with PCP or on call doctor. The patient has been instructed to return immediately if the symptoms worsen in any way for re-evaluation. Patient verbalized understanding and is in agreement with current care plan. All questions answered prior to discharge.     Final Clinical Impression(s) / ED Diagnoses Final diagnoses:  Diarrhea, unspecified type    Rx / DC Orders ED Discharge Orders     None      cia P,  DO 12/29/23 1013    Franne Forts, DO 12/29/23 1014

## 2023-12-29 NOTE — Telephone Encounter (Signed)
 Submitted an authorization request to [employer] through the [employer] portal. I was advised to hold off on submitting clinical chart notes at the time of initial submission as completion of a clinical questionnaire may be required. Will await further correspondence.  Reference#: [id number]

## 2023-12-29 NOTE — ED Triage Notes (Signed)
 Pt has been having diarrhea since Wednesday morning.  Pt states that she has had about 12-15 episodes of watery diarrhea in the past 24 hours.  She states that she has nausea and headache with this.  Pt states that she increased her dose of arava which she takes for RA 6 weeks ago and states that a side effect can be diarrhea.  Pt denies any recent antibiotics or sick contacts.

## 2023-12-29 NOTE — ED Notes (Signed)
 Pt alert and oriented X 4 at the time of discharge. RR even and unlabored. No acute distress noted. Pt verbalized understanding of discharge instructions as discussed. Pt ambulatory to lobby at time of discharge.

## 2023-12-29 NOTE — Discharge Instructions (Addendum)
 Please follow-up with your primary care physician if diarrhea does not improve in the next 3 days for electrolyte recheck.   Stool results will be back in the next several hours.  I will call you for any abnormalities.  Otherwise try to adhere to a bland diet including bread, rice, toast, applesauce.  Stay hydrated.  Choosing a hydration form such as Pedialyte will help keep your electrolytes up if you are still having diarrhea when you go home.

## 2024-01-01 ENCOUNTER — Telehealth (HOSPITAL_BASED_OUTPATIENT_CLINIC_OR_DEPARTMENT_OTHER): Payer: Self-pay | Admitting: Emergency Medicine

## 2024-01-01 LAB — GASTROINTESTINAL PANEL BY PCR, STOOL (REPLACES STOOL CULTURE)

## 2024-01-02 ENCOUNTER — Other Ambulatory Visit (HOSPITAL_COMMUNITY): Payer: Self-pay

## 2024-01-02 ENCOUNTER — Other Ambulatory Visit: Payer: Self-pay

## 2024-01-02 NOTE — Telephone Encounter (Signed)
 Received fax from [place] with clinical questions. Completed form and faxed with clinicals  Auth # [id number] Fax: [phone] Phone: [phone]

## 2024-01-04 ENCOUNTER — Encounter (HOSPITAL_COMMUNITY): Payer: Self-pay

## 2024-01-04 ENCOUNTER — Other Ambulatory Visit (HOSPITAL_COMMUNITY): Payer: Self-pay

## 2024-01-04 ENCOUNTER — Other Ambulatory Visit: Payer: Self-pay | Admitting: Pharmacist

## 2024-01-04 DIAGNOSIS — Z111 Encounter for screening for respiratory tuberculosis: Secondary | ICD-10-CM

## 2024-01-04 DIAGNOSIS — M0579 Rheumatoid arthritis with rheumatoid factor of multiple sites without organ or systems involvement: Secondary | ICD-10-CM

## 2024-01-04 DIAGNOSIS — Z79899 Other long term (current) drug therapy: Secondary | ICD-10-CM

## 2024-01-04 NOTE — Telephone Encounter (Signed)
 Received fax from [facility] regarding pre-certification for [employer]. [id number]). Approved from 12/29/2023 to 12/28/2024 for 366 days  Case # [id number]  Infusion orders placed for Medical Day. MyChart message sent to patient with information for scheduled  Enrolled patient into savings card:    Chesley Mires, PharmD, MPH, BCPS, CPP Clinical Pharmacist (Rheumatology and Pulmonology)

## 2024-01-04 NOTE — Progress Notes (Addendum)
 Patient is new start to [facility] [phone]) Diagnosis:  Dose: 2mg /kg at Weeks 0, 4 then every 8 weeks thereafter  Last Clinic Visit: 12/19/2023 Next Clinic Visit: 02/19/2024  Labs: CBC and CMP on 12/29/2023 TB Gold: 07/27/2022   Orders placed for Simponi Aria (J1602) x 2 doses (Week 0 and Week 4) along with premedication of acetaminophen and diphenhydramine to be administered 30 minutes before medication infusion.  Standing CBC with diff/platelet and CMP with GFR orders placed to be drawn every 2 months.  Next TB gold due (ordered today)  Mychart message sent to patient and provided with phone number for: [facility] [phone]) Ginette Otto

## 2024-01-05 ENCOUNTER — Other Ambulatory Visit: Payer: Self-pay

## 2024-01-05 ENCOUNTER — Ambulatory Visit (HOSPITAL_COMMUNITY)
Admission: RE | Admit: 2024-01-05 | Discharge: 2024-01-05 | Disposition: A | Discharge status: Home / Self Care | Source: Ambulatory Visit | Attending: Rheumatology | Admitting: Rheumatology

## 2024-01-05 DIAGNOSIS — Z111 Encounter for screening for respiratory tuberculosis: Secondary | ICD-10-CM | POA: Diagnosis not present

## 2024-01-05 DIAGNOSIS — Z79899 Other long term (current) drug therapy: Secondary | ICD-10-CM | POA: Diagnosis not present

## 2024-01-05 DIAGNOSIS — M0579 Rheumatoid arthritis with rheumatoid factor of multiple sites without organ or systems involvement: Secondary | ICD-10-CM | POA: Insufficient documentation

## 2024-01-05 MED ORDER — SODIUM CHLORIDE 0.9 % IV SOLN
2.0000 mg/kg | INTRAVENOUS | Status: DC
  Administered 2024-01-05: 190 mg via INTRAVENOUS
  Filled 2024-01-05: qty 15.2

## 2024-01-05 MED ORDER — ACETAMINOPHEN 325 MG PO TABS: 650.0000 mg | ORAL_TABLET | Freq: Once | ORAL | Status: DC

## 2024-01-05 MED ORDER — DIPHENHYDRAMINE HCL 25 MG PO CAPS
25.0000 mg | ORAL_CAPSULE | Freq: Once | ORAL | Status: DC
Start: 2024-01-05 — End: 2024-01-06

## 2024-01-05 NOTE — Addendum Note (Signed)
 Addended by: Murrell Redden on: 01/05/2024 11:09 AM   Modules accepted: Orders

## 2024-01-08 ENCOUNTER — Telehealth: Payer: Self-pay | Admitting: *Deleted

## 2024-01-08 NOTE — Telephone Encounter (Signed)
 She can try taking arava every other day

## 2024-01-08 NOTE — Telephone Encounter (Signed)
 Patient contacted the office stating she has her first infusion last week. Patient states they wanted to draw a TB Gold but she advised them not to because she just had one through [facility]. Patient advised I did locate that result. Patient also states that even with decreasing Arava to 10 mg daily she is still having the diarrhea. Patient states she thinks she can handle it and keep taking the medication but wanted to make you aware.

## 2024-01-09 NOTE — Telephone Encounter (Signed)
 Patient advised she can try taking arava every other day. Patient expressed understanding and states she will contact the office to advise if that helps.

## 2024-01-11 ENCOUNTER — Ambulatory Visit: Admitting: *Deleted

## 2024-01-11 DIAGNOSIS — M810 Age-related osteoporosis without current pathological fracture: Secondary | ICD-10-CM

## 2024-01-11 MED ORDER — ROMOSOZUMAB-AQQG 105 MG/1.17ML ~~LOC~~ SOSY
210.0000 mg | PREFILLED_SYRINGE | SUBCUTANEOUS | Status: DC
Start: 2024-02-10 — End: 2024-01-11

## 2024-01-11 MED ORDER — ROMOSOZUMAB-AQQG 105 MG/1.17ML ~~LOC~~ SOSY
210.0000 mg | PREFILLED_SYRINGE | Freq: Once | SUBCUTANEOUS | Status: AC
Start: 2024-01-11 — End: 2024-01-11
  Administered 2024-01-11: 210 mg via SUBCUTANEOUS

## 2024-01-11 MED ORDER — DENOSUMAB 60 MG/ML ~~LOC~~ SOSY
60.0000 mg | PREFILLED_SYRINGE | Freq: Once | SUBCUTANEOUS | Status: AC
Start: 2024-01-25 — End: 2024-02-08
  Administered 2024-02-08: 60 mg via SUBCUTANEOUS

## 2024-01-11 NOTE — Progress Notes (Signed)
 Patient here for 12/12 Evenity injection per physicians orders  Evenity 105mg   mg SQ , was administered left and right arm today. Patient tolerated injection.  Initial injection: No  Did Prolia come from pharmacy (if yes please select patient supplied): Yes  Cam placed for next injection Yes  Patient will be called about starting Prolia.

## 2024-01-13 ENCOUNTER — Other Ambulatory Visit (HOSPITAL_COMMUNITY): Payer: Self-pay

## 2024-01-15 ENCOUNTER — Other Ambulatory Visit: Payer: Self-pay | Admitting: Rheumatology

## 2024-01-15 ENCOUNTER — Other Ambulatory Visit (HOSPITAL_COMMUNITY): Payer: Self-pay

## 2024-01-15 MED ORDER — PREDNISONE 5 MG PO TABS
5.0000 mg | ORAL_TABLET | Freq: Every day | ORAL | 1 refills | Status: DC
  Filled 2024-01-15 – 2024-01-23 (×2): qty 30, 30d supply, fill #0

## 2024-01-15 NOTE — Telephone Encounter (Signed)
 Last Fill: 12/19/2023  Next Visit: 02/19/2024  Last Visit: 12/19/2023  Dx: Rheumatoid arthritis involving multiple sites with positive rheumatoid factor   Current Dose per office note on 12/19/2022: prednisone 5 mg a day   Okay to refill Prednisone?

## 2024-01-16 ENCOUNTER — Other Ambulatory Visit (HOSPITAL_COMMUNITY): Payer: Self-pay

## 2024-01-16 ENCOUNTER — Telehealth: Payer: Self-pay | Admitting: *Deleted

## 2024-01-16 ENCOUNTER — Encounter: Payer: Self-pay | Admitting: Rheumatology

## 2024-01-16 NOTE — Telephone Encounter (Signed)
 Error

## 2024-01-22 ENCOUNTER — Other Ambulatory Visit: Payer: Self-pay

## 2024-01-22 ENCOUNTER — Telehealth: Payer: Self-pay

## 2024-01-22 ENCOUNTER — Other Ambulatory Visit (HOSPITAL_COMMUNITY): Payer: Self-pay

## 2024-01-22 NOTE — Telephone Encounter (Signed)
 Aaron Aas

## 2024-01-22 NOTE — Telephone Encounter (Signed)
 Pharmacy Patient Advocate Encounter   Received notification from  [employer] Portal that prior authorization for PROLIA  is required/requested.   Insurance verification completed.   The patient is insured through [employer] .   Per test claim: PA required; PA submitted to above mentioned insurance via Availity Key/confirmation #/EOC  [id number] Status is pending

## 2024-01-22 NOTE — Telephone Encounter (Signed)
 Prolia VOB initiated via [email or web address]  Next Prolia inj DUE: NEW START

## 2024-01-23 ENCOUNTER — Other Ambulatory Visit: Payer: Self-pay

## 2024-01-23 ENCOUNTER — Other Ambulatory Visit (HOSPITAL_COMMUNITY): Payer: Self-pay

## 2024-01-23 ENCOUNTER — Ambulatory Visit: Payer: Commercial Managed Care - PPO | Admitting: Physician Assistant

## 2024-01-23 NOTE — Telephone Encounter (Signed)
 PA submitted via CMM for pharmacy benefit. Key: [id number]

## 2024-01-25 ENCOUNTER — Telehealth: Payer: Self-pay | Admitting: *Deleted

## 2024-01-25 ENCOUNTER — Other Ambulatory Visit (HOSPITAL_COMMUNITY): Payer: Self-pay

## 2024-01-25 NOTE — Telephone Encounter (Signed)
 Pt ready for scheduling for PROLIA  on or after : 01/25/24  Option# 1: Buy/Bill (Office supplied medication)  Out-of-pocket cost due at time of clinic visit: $100  Number of injection/visits approved: 2  Primary: [employer] Prolia  co-insurance: $50 Admin fee co-insurance: $50  Secondary: --- Prolia  co-insurance:  Admin fee co-insurance:   Medical Benefit Details: Date Benefits were checked: 01/22/24 Deductible: NO/ Coinsurance: $50/ Admin Fee: $50  Prior Auth: APPROVED PA# [id number]  Expiration Date: 01/22/24-01/20/25  # of doses approved: 2 ----------------------------------------------------------------------- Option# 2- Med Obtained from pharmacy:  Pharmacy benefit: Copay $750 (Paid to pharmacy) Admin Fee: $50 (Pay at clinic)  Prior Auth: APPROVED PA# [id number] Expiration Date: 01/25/24-01/23/25  # of doses approved: 2   If patient wants fill through the pharmacy benefit please send prescription to: [employer], and include estimated need by date in rx notes. Pharmacy will ship medication directly to the office.  Patient IS eligible for Prolia  Copay Card. Copay Card can make patient's cost as little as $25. Link to apply: [email or web address]  ** This summary of benefits is an estimation of the patient's out-of-pocket cost. Exact cost may very based on individual plan coverage.

## 2024-01-25 NOTE — Telephone Encounter (Signed)
 Patient contacted the office regarding her short term disability. Patient states she will need her short ter disability extended. Patient advised that we will have to come for her follow up visit prior to us  being able to complete any additional paperwork. Patient advised she will need the evaluation to determine if it is appropriate to extend her leave. Patient expressed understanding.

## 2024-01-25 NOTE — Telephone Encounter (Signed)
 Pharmacy Patient Advocate Encounter  Received notification from [employer] that Prior Authorization for PROLIA  has been APPROVED from 01/25/24 to 01/23/25. Ran test claim, Copay is $750. This test claim was processed through [facility]- copay amounts may vary at other pharmacies due to pharmacy/plan contracts, or as the patient moves through the different stages of their insurance plan.   PA #/Case ID/Reference #: [id number]

## 2024-01-26 ENCOUNTER — Encounter (HOSPITAL_COMMUNITY): Payer: Self-pay

## 2024-01-26 ENCOUNTER — Other Ambulatory Visit: Payer: Self-pay

## 2024-01-26 ENCOUNTER — Telehealth: Payer: Self-pay

## 2024-01-26 ENCOUNTER — Other Ambulatory Visit: Payer: Self-pay | Admitting: *Deleted

## 2024-01-26 ENCOUNTER — Ambulatory Visit: Attending: Family Medicine | Admitting: Pharmacist

## 2024-01-26 ENCOUNTER — Other Ambulatory Visit (HOSPITAL_COMMUNITY): Payer: Self-pay

## 2024-01-26 ENCOUNTER — Other Ambulatory Visit: Payer: Self-pay | Admitting: Pharmacist

## 2024-01-26 ENCOUNTER — Encounter: Payer: Self-pay | Admitting: Pharmacist

## 2024-01-26 DIAGNOSIS — Z79899 Other long term (current) drug therapy: Secondary | ICD-10-CM

## 2024-01-26 DIAGNOSIS — M810 Age-related osteoporosis without current pathological fracture: Secondary | ICD-10-CM

## 2024-01-26 MED ORDER — DENOSUMAB 60 MG/ML ~~LOC~~ SOSY
60.0000 mg | PREFILLED_SYRINGE | SUBCUTANEOUS | 0 refills | Status: DC
  Filled 2024-01-26 – 2024-02-05 (×2): qty 1, 180d supply, fill #0

## 2024-01-26 MED ORDER — DENOSUMAB 60 MG/ML ~~LOC~~ SOSY
60.0000 mg | PREFILLED_SYRINGE | SUBCUTANEOUS | 0 refills | Status: DC
  Filled 2024-01-26: qty 1, 180d supply, fill #0

## 2024-01-26 NOTE — Progress Notes (Addendum)
 Received new rx for Prolia . Tiffany to address whether will be filled by pharmacy or buy and bill. Will also see if we will d/c Evenity .

## 2024-01-26 NOTE — Telephone Encounter (Signed)
 Copied from CRM [id number]. Topic: Clinical - Medication Question >> Jan 26, 2024 10:03 AM Star [place] wrote: Reason for CRM: Connell Degree with [employer]- they sent over a summary of benefits for prolia  on 4/21, disregard this one. They are working on a new summary- new case number [id number]- phone [phone]

## 2024-01-26 NOTE — Progress Notes (Signed)
 See OV from 01/26/2024 for complete documentation.  Marene Shape, PharmD, Becky Bowels, CPP Clinical Pharmacist [facility] [phone]

## 2024-01-26 NOTE — Progress Notes (Signed)
 Patient is finished with Evenity 

## 2024-01-26 NOTE — Telephone Encounter (Signed)
 Pt ready for scheduling for PROLIA  on or after : 01/25/24   Option# 1: Buy/Bill (Office supplied medication)   Out-of-pocket cost due at time of clinic visit: $50   Number of injection/visits approved: 2   Primary: [employer] Prolia  co-insurance: $50 Admin fee co-insurance: $0   Secondary: --- Prolia  co-insurance:  Admin fee co-insurance:    Medical Benefit Details: Date Benefits were checked: 01/26/24 Deductible: $300 Met of $300 Required/ Coinsurance: $50/ Admin Fee: $0   Prior Auth: APPROVED PA# [id number]  Expiration Date: 01/22/24-01/20/25  # of doses approved: 2 ----------------------------------------------------------------------- Option# 2- Med Obtained from pharmacy:   Pharmacy benefit: Copay $750 (Paid to pharmacy) Admin Fee: $0 (Pay at clinic)   Prior Auth: APPROVED PA# [id number] Expiration Date: 01/25/24-01/23/25  # of doses approved: 2     If patient wants fill through the pharmacy benefit please send prescription to: [employer], and include estimated need by date in rx notes. Pharmacy will ship medication directly to the office.   Patient IS eligible for Prolia  Copay Card. Copay Card can make patient's cost as little as $25. Link to apply: [email or web address]   ** This summary of benefits is an estimation of the patient's out-of-pocket cost. Exact cost may very based on individual plan coverage.

## 2024-01-26 NOTE — Progress Notes (Addendum)
 Patient received 12/12 Evenity  injections and will now be using Prolia .    Pharmacy Patient Advocate Encounter  Insurance verification completed.   The patient is insured through [facility]   Ran test claim for Prolia . Co-pay is $0. Patient has copay card.  This test claim was processed through [facility]- copay amounts may vary at other pharmacies due to pharmacy/plan contracts, or as the patient moves through the different stages of their insurance plan.

## 2024-01-26 NOTE — Progress Notes (Signed)
 S:  Patient presents for review of their specialty medication therapy.  Patient is about to start taking Prolia  for osteoporosis.  She completed 1 year of Evenity . Patient is managed by Dr. Jalene Mayor for this.   Adherence: has not yet started    Efficacy: has not yet started   Dosing: 60 mg once every 6 months   Dose adjustments: Renal: Monitor patients with severe impairment (CrCl <30 mL/minute or on dialysis) closely, as significant and prolonged hypocalcemia (incidence of 29% and potentially lasting weeks to months) and marked elevations of serum parathyroid hormone are serious risks in this population. Ensure adequate calcium  and vitamin D  intake/supplementation. CrCl >=30 mL/minute: No dosage adjustment necessary. CrCl <30 mL/minute: No dosage adjustment necessary; use in conjunction with guidance from patient's nephrology team. Hepatic: no dose adjustments (has not been studied)  Drug-drug interactions: none identified   Screening: TB test: completed, negative  Hepatitis: completed, negative   Monitoring: S/sx of infection: has not started  S/sx of hypersensitivity: has not started  S/sx of hypocalcemia/hypercalcemia: has not started  Dermatitis/skin rash: has not started  Peripheral edema: has not started  HA: has not started  GI upset: has not started   Other side effects: has not started   Last bone density study: 12/2020 - has one scheduled 02/22/2024   O:      Lab Results  Component Value Date   WBC 5.4 12/29/2023   HGB 14.2 12/29/2023   HCT 41.1 12/29/2023   MCV 88.4 12/29/2023   PLT 237 12/29/2023      Chemistry      Component Value Date/Time   NA 133 (L) 12/29/2023 0730   NA 138 07/28/2023 1509   K 2.8 (L) 12/29/2023 0730   CL 100 12/29/2023 0730   CO2 18 (L) 12/29/2023 0730   BUN 15 12/29/2023 0730   BUN 12 07/28/2023 1509   CREATININE 0.76 12/29/2023 0730   CREATININE 0.71 11/30/2023 1138   GLU 88 02/04/2016 0000      Component Value Date/Time    CALCIUM  8.7 (L) 12/29/2023 0730   ALKPHOS 59 12/29/2023 0730   AST 23 12/29/2023 0730   ALT 29 12/29/2023 0730   BILITOT 0.5 12/29/2023 0730   BILITOT 0.4 07/28/2023 1509       A/P: 1. Medication review: Patient currently on Prolia  for osteoporosis. Reviewed the medication with the patient, including the following: Prolia  (denosumab ) is a monoclonal antibody with affinity for nuclear factor-kappa ligand (RANKL). Prolia  binds to RANKL and prevents osteoclast formation, leading to decreased bone resorption and increased bone mass in osteoporosis. Patient educated on purpose, proper use, and potential adverse effects of Prolia . The most common adverse effects are hypersensitivities, peripheral edema, dermatitis/skin rash, GI upset, HA, joint pain, and infection. There is the possibility of atypical femur fracture, serum calcium  disturbances, and osteonecrosis of the jaw. Patients should monitor for and report hip, thigh, or groin pain. Additionally, patients should monitor for and report jaw pain, tooth/periodontal infection, toothache, and/or gingival ulceration/erosion. Prolia  exists as a solution prefilled syringe for SQ administration. Administration: Denosumab  is intended for SubQ route only and should not be administered IV, IM, or intradermally. Prior to administration, bring to room temperature in original container (allow to stand ~15 to 30 minutes); do not warm by any other method. Solution may contain trace amounts of translucent to white protein particles; do not use if cloudy, discolored (normal solution should be clear and colorless to pale yellow), or contains excessive particles or foreign matter.  Avoid vigorous shaking. Administer via SubQ injection in the upper arm, upper thigh, or abdomen; should only be administered by a health care professional. No recommendations for any changes at this time.   Marene Shape, PharmD, Becky Bowels, CPP Clinical Pharmacist [facility] [phone]

## 2024-01-26 NOTE — Telephone Encounter (Signed)
 Sharon Hunter

## 2024-01-26 NOTE — Telephone Encounter (Signed)
 Referral updated with new summary

## 2024-02-02 ENCOUNTER — Encounter (HOSPITAL_COMMUNITY)
Admission: RE | Admit: 2024-02-02 | Discharge: 2024-02-02 | Disposition: A | Discharge status: Home / Self Care | Source: Ambulatory Visit | Attending: Rheumatology | Admitting: Rheumatology

## 2024-02-02 DIAGNOSIS — M25511 Pain in right shoulder: Secondary | ICD-10-CM | POA: Diagnosis not present

## 2024-02-02 DIAGNOSIS — R911 Solitary pulmonary nodule: Secondary | ICD-10-CM | POA: Insufficient documentation

## 2024-02-02 DIAGNOSIS — Z8659 Personal history of other mental and behavioral disorders: Secondary | ICD-10-CM | POA: Diagnosis present

## 2024-02-02 DIAGNOSIS — Z7952 Long term (current) use of systemic steroids: Secondary | ICD-10-CM | POA: Diagnosis not present

## 2024-02-02 DIAGNOSIS — G8929 Other chronic pain: Secondary | ICD-10-CM | POA: Diagnosis not present

## 2024-02-02 DIAGNOSIS — R5383 Other fatigue: Secondary | ICD-10-CM | POA: Diagnosis not present

## 2024-02-02 DIAGNOSIS — M19042 Primary osteoarthritis, left hand: Secondary | ICD-10-CM | POA: Diagnosis not present

## 2024-02-02 DIAGNOSIS — M25512 Pain in left shoulder: Secondary | ICD-10-CM | POA: Insufficient documentation

## 2024-02-02 DIAGNOSIS — M25471 Effusion, right ankle: Secondary | ICD-10-CM | POA: Diagnosis not present

## 2024-02-02 DIAGNOSIS — Z8639 Personal history of other endocrine, nutritional and metabolic disease: Secondary | ICD-10-CM | POA: Insufficient documentation

## 2024-02-02 DIAGNOSIS — Z79899 Other long term (current) drug therapy: Secondary | ICD-10-CM | POA: Diagnosis not present

## 2024-02-02 DIAGNOSIS — M0579 Rheumatoid arthritis with rheumatoid factor of multiple sites without organ or systems involvement: Secondary | ICD-10-CM | POA: Insufficient documentation

## 2024-02-02 DIAGNOSIS — Z8619 Personal history of other infectious and parasitic diseases: Secondary | ICD-10-CM | POA: Insufficient documentation

## 2024-02-02 DIAGNOSIS — Z8679 Personal history of other diseases of the circulatory system: Secondary | ICD-10-CM | POA: Diagnosis present

## 2024-02-02 DIAGNOSIS — M19041 Primary osteoarthritis, right hand: Secondary | ICD-10-CM | POA: Diagnosis not present

## 2024-02-02 DIAGNOSIS — M810 Age-related osteoporosis without current pathological fracture: Secondary | ICD-10-CM | POA: Insufficient documentation

## 2024-02-02 DIAGNOSIS — M3500 Sicca syndrome, unspecified: Secondary | ICD-10-CM | POA: Insufficient documentation

## 2024-02-02 MED ORDER — DIPHENHYDRAMINE HCL 25 MG PO CAPS
25.0000 mg | ORAL_CAPSULE | Freq: Once | ORAL | Status: DC
Start: 2024-02-02 — End: 2024-02-03

## 2024-02-02 MED ORDER — ACETAMINOPHEN 325 MG PO TABS: 650.0000 mg | ORAL_TABLET | Freq: Once | ORAL | Status: DC

## 2024-02-02 MED ORDER — SODIUM CHLORIDE 0.9 % IV SOLN
2.0000 mg/kg | INTRAVENOUS | Status: DC
  Administered 2024-02-02: 181.25 mg via INTRAVENOUS
  Filled 2024-02-02: qty 14.5

## 2024-02-05 ENCOUNTER — Other Ambulatory Visit: Payer: Self-pay

## 2024-02-05 NOTE — Progress Notes (Signed)
 Specialty Pharmacy Refill Coordination Note  Sharon Hunter is a 63 y.o. female contacted today regarding refills of specialty medication(s) Denosumab  (PROLIA )   Patient requested Courier to Provider Office   Delivery date: 02/06/24   Verified address: [address] [facility] [place] [address]   Medication will be filled on 05.05.25.

## 2024-02-07 NOTE — Progress Notes (Signed)
 Office Visit Note  Patient: Sharon Hunter             Date of Birth: 02-13-1961           MRN: [id number]             PCP: Estill Hemming, DO Referring: Estill Hemming, * Visit Date: 02/19/2024 Occupation: @GUAROCC @  Subjective:  Medication monitoring   History of Present Illness: Sharon Hunter is a 63 y.o. female with history of seropositive rheumatoid arthritis.  Patient is currently prescribed simponi  aria IV infusions and arava  20 mg every other day.  Patient has had a total of 2 Simponi  Aria infusions including weeks 0 and week 4.  She has not had a new flare since initiating Simponi  Aria but continues to experience pain and stiffness involving multiple joints.  Her symptoms have been most severe involving both shoulders, left wrist, right hip, and left ankle joint.  Patient has difficulty standing or walking for long periods of time.  She also has difficulty performing ADLs at times.  Her joint stiffness has been lasting all day.  She has been taking Motrin as needed for symptomatic relief.  She denies any recurrent infections.  She denies any new medical conditions. She completed the one year course of evenity  (completed 01/11/24) and has been transitioned to prolia  injections every 6 months.  She received prolia  on 02/05/24.    Activities of Daily Living:  Patient reports morning stiffness for all day.  Patient Reports nocturnal pain.  Difficulty dressing/grooming: Reports Difficulty climbing stairs: Denies Difficulty getting out of chair: Reports Difficulty using hands for taps, buttons, cutlery, and/or writing: Reports  Review of Systems  Constitutional:  Positive for fatigue.  HENT:  Positive for nose dryness. Negative for mouth sores and mouth dryness.   Eyes:  Negative for pain and dryness.  Respiratory:  Negative for shortness of breath and difficulty breathing.   Cardiovascular:  Negative for chest pain and palpitations.  Gastrointestinal:  Positive for  diarrhea. Negative for blood in stool and constipation.  Endocrine: Negative for increased urination.  Genitourinary:  Negative for involuntary urination.  Musculoskeletal:  Positive for joint pain, gait problem, joint pain, joint swelling, muscle weakness and morning stiffness. Negative for myalgias, muscle tenderness and myalgias.  Skin:  Negative for color change, rash, hair loss and sensitivity to sunlight.  Allergic/Immunologic: Positive for susceptible to infections.  Neurological:  Negative for dizziness and headaches.  Hematological:  Negative for swollen glands.  Psychiatric/Behavioral:  Negative for depressed mood and sleep disturbance. The patient is not nervous/anxious.     PMFS History:  Patient Active Problem List   Diagnosis Date Noted   Acute otitis media 08/11/2023   Mood disorder (HCC)- Emotional Eating 08/02/2022   Class 1 obesity with serious comorbidity and body mass index (BMI) of 30.0 to 30.9 in adult 08/02/2022   At risk for impaired metabolic function 05/30/2022   Perioral dermatitis 05/26/2022   Preventative health care 05/26/2022   Essential hypertension 05/26/2022   Abnormal EKG 05/26/2022   Prediabetes 03/09/2022   Vitamin B12 deficiency 03/09/2022   Primary hypertension 02/22/2022   Hyperlipidemia 02/22/2022   GAD (generalized anxiety disorder) 02/22/2022   Vitamin D  deficiency 02/22/2022   Ear pain, left 08/13/2021   History of anxiety 03/27/2017   History of hypertension 03/27/2017   History of bundle branch block 03/27/2017   Primary osteoarthritis of both hands 12/22/2016   History of shingles 08/02/2016   High  risk medication use 08/02/2016   Hypertension 08/01/2016   IBS (irritable bowel syndrome) 08/01/2016   Anxiety 08/01/2016   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   LBBB (left bundle branch block) 01/30/2015   Palpitations 01/30/2015   Pulmonary nodule 07/04/2011   Murmur 02/03/2011   Rheumatoid arthritis (HCC) 02/03/2011    Hair loss 02/03/2011   Fatigue 02/03/2011    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   Hypertension    IBS (irritable bowel syndrome) 08/01/2016   Joint pain    Lactose intolerance    Migraine    Murmur 02/03/2011   Obese    Ovarian failure    Palpitation    Pulmonary nodule, left 2009   last CT Nov 2011-- due 08/2011   Rheumatoid arthritis(714.0)    in remission , off meds as of Summer 2012   Shingles 08/2023   Vitamin D  deficiency     Family History  Problem Relation Age of Onset   Cancer Mother    Arthritis Mother    Uterine cancer Mother    Hypertension Mother    Diabetes Mother    Esophageal cancer Mother    Other Mother        small bowel obstruction   Alcohol abuse Father    Hyperlipidemia Father    Heart disease Father    High blood pressure Father    Sleep apnea Father    Uterine cancer Sister    Heart disease Sister        cardiac stent   Diabetes Sister    Hyperlipidemia Sister    Hypertension Sister    Stroke Sister    Alcohol abuse Brother    Diabetes Brother    Hypertension Brother    Liver cancer Brother    Cancer Brother        lung   Cancer Maternal Aunt        lung----non smoker   Past Surgical History:  Procedure Laterality Date   CHOLECYSTECTOMY  1999   COLONOSCOPY  10/31/11   EXPLORATORY LAPAROTOMY     TONSILLECTOMY  1969   WISDOM TOOTH EXTRACTION     Social History   Social History Narrative   Exercise-- jogs   Immunization History  Administered Date(s) Administered   Influenza Split 07/03/2013   Influenza,inj,Quad PF,6+ Mos 07/01/2023   Influenza,inj,Quad PF,6-35 Mos 07/17/2020   Influenza-Unspecified 07/03/2014, 07/04/2015   PFIZER(Purple Top)SARS-COV-2 Vaccination 01/13/2020, 05/15/2020   Tdap 12/01/2009     Objective: Vital Signs: BP 125/82 (BP Location: Left Arm, Patient Position: Sitting, Cuff Size: Normal)    Pulse 85   Resp 16   Ht 5\' 7"  (1.702 m)   Wt 213 lb 9.6 oz (96.9 kg)   LMP 10/03/2005   BMI 33.45 kg/m    Physical Exam Vitals and nursing note reviewed.  Constitutional:      Appearance: She is well-developed.  HENT:     Head: Normocephalic and atraumatic.  Eyes:     Conjunctiva/sclera: Conjunctivae normal.  Cardiovascular:     Rate and Rhythm: Normal rate and regular rhythm.     Heart sounds: Normal heart sounds.  Pulmonary:     Effort: Pulmonary effort is normal.     Breath sounds: Normal breath sounds.  Abdominal:     General: Bowel sounds are normal.     Palpations: Abdomen is soft.  Musculoskeletal:     Cervical back: Normal range of motion.  Lymphadenopathy:     Cervical: No cervical adenopathy.  Skin:    General: Skin is warm and dry.     Capillary Refill: Capillary refill takes less than 2 seconds.  Neurological:     Mental Status: She is alert and oriented to person, place, and time.  Psychiatric:        Behavior: Behavior normal.      Musculoskeletal Exam: Cervical spine has good range of motion.  Painful range of motion of both shoulders especially with abduction and internal rotation.  Limited abduction to about 90 degrees bilaterally.  Elbow joints have good range of motion with no tenderness along the joint line.  Slightly limited extension of the left wrist noted.  Mild inflammation was noted in the right 2nd and 5th MCP joints and right 4th and 5th PIP joints.  Painful range of motion of the right hip noted.  Knee joints have good range of motion with no warmth or effusion.  Tenderness of both ankle joints, left greater than right.  Tenderness and inflammation noted on the dorsal aspect of the left foot.  CDAI Exam: CDAI Score: -- Patient Global: --; Provider Global: -- Swollen: --; Tender: -- Joint Exam 02/19/2024   No joint exam has been documented for this visit   There is currently no information documented on the homunculus. Go to the  Rheumatology activity and complete the homunculus joint exam.  Investigation: No additional findings.  Imaging: No results found.  Recent Labs: Lab Results  Component Value Date   WBC 5.4 12/29/2023   HGB 14.2 12/29/2023   PLT 237 12/29/2023   NA 133 (L) 12/29/2023   K 2.8 (L) 12/29/2023   CL 100 12/29/2023   CO2 18 (L) 12/29/2023   GLUCOSE 116 (H) 12/29/2023   BUN 15 12/29/2023   CREATININE 0.76 12/29/2023   BILITOT 0.5 12/29/2023   ALKPHOS 59 12/29/2023   AST 23 12/29/2023   ALT 29 12/29/2023   PROT 7.1 12/29/2023   ALBUMIN 3.5 12/29/2023   CALCIUM  8.7 (L) 12/29/2023   GFRAA 86 01/07/2021   QFTBGOLDPLUS NEGATIVE 09/12/2018    Speciality Comments: TB Gold: 07/28/2023 Neg  Prior therapy: methotrexate, remicade , orencia, and enbrel (inadequate response), Simponi  stopped Dec 2023, Actemra  started 10/17/22-June 2024; Xeljanz  liq started 03/27/23  Procedures:  No procedures performed Allergies: Other, Contrast media [iodinated contrast media], and Rosuvastatin     Assessment / Plan:     Visit Diagnoses: Rheumatoid arthritis involving multiple sites with positive rheumatoid factor (HCC) - D/d xeljanz  due to shingles outbreak, inadequate response.Prev therapy:methotrexate,Remicade ,Orencia,Enbrel,Simponi ,Actemra ,Xeljanz :  Patient was initiated on the loading dose of Simponi  Aria on 01/05/2024 and had the second dose at week 4 on 02/02/2024.   She has tolerated Simponi  Aria without any side effects and has started to notice clinical improvement.  She has not noticed any new flares but has had persistent pain and stiffness involving both shoulders, left wrist, right hip, and left foot and ankle.  She is no longer taking prednisone  but takes Motrin as needed for symptomatic relief.  She continues to have difficulty performing ADLs and fine motor skills due to the severity of rheumatoid arthritis and previous joint damage. Plan to extend short-term disability due to the severity of her  symptoms.  A work note was provided today and any additional paperwork will be updated accordingly. Patient will now remain on Simponi  Aria IV infusions every 8 weeks and Arava  20  mg 1 tablet by mouth every other day.  She will notify us  if her symptoms persist or worsen.  She will continue to follow-up closely-2 months or sooner if needed.   High risk medication use - Simponi  Aria IV infusions every 8 weeks as a maintenance dose and Arava  20 mg 1 tablet by mouth every other day. Previous therapy includes methotrexate, Remicade , Orencia, Enbrel, Simponi , Actemra , Xeljanz  CBC and CMP updated on 12/29/23.  She will continue to have lab work drawn with infusions. TB gold negative on 07/28/23.  Discussed the importance of holding Simponi  Aria and Arava  if she develops signs or symptoms of an infection and to resume once the infection is completely cleared.  Long term (current) use of systemic steroids - Previously on prednisone  5 mg daily.   Chronic pain of both shoulders: Chronic pain.  Patient continues to experience discomfort and stiffness involving both shoulders.  She has had ongoing nocturnal pain and has had to have positional changes at night.  On examination she has limited abduction to about 90 degrees as well as discomfort and limitation with internal rotation bilaterally.  She takes Motrin as needed to help alleviate some of her discomfort.  At times the patient has difficulty performing ADLs due to discomfort in her shoulders.  Primary osteoarthritis of both hands: Patient continues to experience intermittent pain and stiffness involving both hands.  She has noticed some soreness in the left wrist and has slightly limited extension on exam.  At times she is difficulty performing ADLs.  She has started to notice some improvement since initiating Simponi  Dennard Fisher but is also been more aware of previous joint damage from rheumatoid arthritis interfering with fine motor skills.  Right ankle swelling  - She has tenderness along the right ankle joint line but no active inflammation was noted today.  She has difficulty walking prolonged distances due to the discomfort in her ankles.  Other fatigue: Chronic, stable.  Sicca syndrome (HCC): Chronic, stable.  History of vitamin D  deficiency: Vitamin D  was 30 on 11/28/2022.  Age-related osteoporosis without current pathological fracture - Hx of long term prednisone  use.  DEXA 12/03/20: The BMD measured at [facility] L1-L2 is 0.851 g/cm2 with a T-score of -2.6.   DEXA order remains in place.  She completed the one year course of evenity  (completed 01/11/24) and has been transitioned to prolia  injections every 6 months.  She received prolia  on 02/05/24.   Other medical conditions are listed as follows:  History of hypertension: Blood pressure was 125/82 today in the office.  History of anxiety  Pulmonary nodule  History of shingles: Patient is planning to proceed with the Shingrix vaccine series soon.  History of bundle branch block  History of hyperlipidemia  Orders: No orders of the defined types were placed in this encounter.  No orders of the defined types were placed in this encounter.   Follow-Up Instructions: Return in about 2 months (around 04/20/2024) for Rheumatoid arthritis.   Romayne Clubs, PA-C  Note - This record has been created using Dragon software.  Chart creation errors have been sought, but may not always  have been located. Such creation errors do not reflect on  the standard of medical care.

## 2024-02-08 ENCOUNTER — Telehealth (HOSPITAL_BASED_OUTPATIENT_CLINIC_OR_DEPARTMENT_OTHER): Payer: Self-pay

## 2024-02-08 ENCOUNTER — Ambulatory Visit

## 2024-02-08 DIAGNOSIS — M810 Age-related osteoporosis without current pathological fracture: Secondary | ICD-10-CM | POA: Diagnosis not present

## 2024-02-08 MED ORDER — DENOSUMAB 60 MG/ML ~~LOC~~ SOSY
60.0000 mg | PREFILLED_SYRINGE | SUBCUTANEOUS | Status: AC
Start: 2024-08-06 — End: 2024-08-28
  Administered 2024-08-28: 60 mg via SUBCUTANEOUS

## 2024-02-08 NOTE — Progress Notes (Signed)
 Patient here for Prolia  injection per physicians orders  Prolia  60 mg SQ , was administered left arm today. Patient tolerated injection.  Patient next injection due: 6 months, appt made:  No- will schedule in 5 months after benefits are ran again  Initial injection: yes  Did Prolia  come from pharmacy (if yes please select patient supplied): yes  Cam placed for next injection: yes

## 2024-02-15 ENCOUNTER — Other Ambulatory Visit: Payer: Self-pay

## 2024-02-19 ENCOUNTER — Encounter: Admitting: Physician Assistant

## 2024-02-19 ENCOUNTER — Encounter: Payer: Self-pay | Admitting: Physician Assistant

## 2024-02-19 VITALS — BP 125/82 | HR 85 | Resp 16 | Ht 67.0 in | Wt 213.6 lb

## 2024-02-19 DIAGNOSIS — M25471 Effusion, right ankle: Secondary | ICD-10-CM

## 2024-02-19 DIAGNOSIS — M0579 Rheumatoid arthritis with rheumatoid factor of multiple sites without organ or systems involvement: Secondary | ICD-10-CM | POA: Diagnosis not present

## 2024-02-19 DIAGNOSIS — Z79899 Other long term (current) drug therapy: Secondary | ICD-10-CM | POA: Diagnosis not present

## 2024-02-19 DIAGNOSIS — M3500 Sicca syndrome, unspecified: Secondary | ICD-10-CM | POA: Diagnosis not present

## 2024-02-19 DIAGNOSIS — Z8619 Personal history of other infectious and parasitic diseases: Secondary | ICD-10-CM

## 2024-02-19 DIAGNOSIS — Z7952 Long term (current) use of systemic steroids: Secondary | ICD-10-CM

## 2024-02-19 DIAGNOSIS — M19041 Primary osteoarthritis, right hand: Secondary | ICD-10-CM

## 2024-02-19 DIAGNOSIS — M25511 Pain in right shoulder: Secondary | ICD-10-CM | POA: Diagnosis not present

## 2024-02-19 DIAGNOSIS — M810 Age-related osteoporosis without current pathological fracture: Secondary | ICD-10-CM

## 2024-02-19 DIAGNOSIS — Z8639 Personal history of other endocrine, nutritional and metabolic disease: Secondary | ICD-10-CM

## 2024-02-19 DIAGNOSIS — R5383 Other fatigue: Secondary | ICD-10-CM

## 2024-02-19 DIAGNOSIS — Z8679 Personal history of other diseases of the circulatory system: Secondary | ICD-10-CM

## 2024-02-19 DIAGNOSIS — Z8659 Personal history of other mental and behavioral disorders: Secondary | ICD-10-CM

## 2024-02-19 DIAGNOSIS — M25512 Pain in left shoulder: Secondary | ICD-10-CM

## 2024-02-19 DIAGNOSIS — M19042 Primary osteoarthritis, left hand: Secondary | ICD-10-CM

## 2024-02-19 DIAGNOSIS — R911 Solitary pulmonary nodule: Secondary | ICD-10-CM

## 2024-02-19 DIAGNOSIS — G8929 Other chronic pain: Secondary | ICD-10-CM

## 2024-02-20 ENCOUNTER — Other Ambulatory Visit: Payer: Self-pay | Admitting: Pharmacist

## 2024-02-20 ENCOUNTER — Encounter: Payer: Self-pay | Admitting: Rheumatology

## 2024-02-20 ENCOUNTER — Encounter: Payer: Self-pay | Admitting: Pharmacist

## 2024-02-20 DIAGNOSIS — M0579 Rheumatoid arthritis with rheumatoid factor of multiple sites without organ or systems involvement: Secondary | ICD-10-CM

## 2024-02-20 DIAGNOSIS — Z79899 Other long term (current) drug therapy: Secondary | ICD-10-CM

## 2024-02-20 NOTE — Progress Notes (Signed)
 Next infusion for Simponi  Aria [phone]) due 03/29/24 (patient will need infusion re-scheduled) Diagnosis: RA  Dose: 2mg /kg every 8 weeks (Week 0 dose completed on 01/05/24 and Week 4 dose completed on 02/02/24)  Last Clinic Visit: 02/19/24 Next Clinic Visit: 05/21/24  Last infusion: 02/02/24 (Week 4 dose)  Labs: CBC and CMP on 12/29/23) TB Gold: negative on 08/02/2023  Orders placed for Simponi  Aria (J1602) x 2 doses along with premedication of acetaminophen  and diphenhydramine  to be administered 30 minutes before medication infusion.  Standing CBC with diff/platelet and CMP with GFR orders placed to be drawn every 2 months.  Next TB gold due Oct 2025)  Message sent to Kristin R via secure chat and MyChart message sent to patient to reschedule as above  Geraldene Kleine, PharmD, MPH, BCPS, CPP Clinical Pharmacist (Rheumatology and Pulmonology)

## 2024-02-22 ENCOUNTER — Other Ambulatory Visit (HOSPITAL_BASED_OUTPATIENT_CLINIC_OR_DEPARTMENT_OTHER)

## 2024-02-22 ENCOUNTER — Other Ambulatory Visit (HOSPITAL_COMMUNITY): Payer: Self-pay

## 2024-02-27 ENCOUNTER — Other Ambulatory Visit: Payer: Self-pay

## 2024-03-01 ENCOUNTER — Telehealth: Payer: Self-pay

## 2024-03-01 ENCOUNTER — Encounter (HOSPITAL_COMMUNITY)

## 2024-03-01 NOTE — Telephone Encounter (Signed)
 Received request from [facility] for patient for diagnostic test results and operative/ procedural notes from 12/20/23 to present. Request set to medical records

## 2024-03-10 ENCOUNTER — Encounter: Payer: Self-pay | Admitting: Rheumatology

## 2024-03-10 ENCOUNTER — Other Ambulatory Visit: Payer: Self-pay | Admitting: Physician Assistant

## 2024-03-11 ENCOUNTER — Other Ambulatory Visit: Payer: Self-pay

## 2024-03-11 ENCOUNTER — Encounter: Payer: Self-pay | Admitting: Rheumatology

## 2024-03-11 ENCOUNTER — Other Ambulatory Visit (HOSPITAL_COMMUNITY): Payer: Self-pay

## 2024-03-11 MED ORDER — LEFLUNOMIDE 20 MG PO TABS
20.0000 mg | ORAL_TABLET | ORAL | 0 refills | Status: DC
  Filled 2024-03-11: qty 45, 90d supply, fill #0

## 2024-03-11 MED ORDER — AMOXICILLIN-POT CLAVULANATE 400-57 MG/5ML PO SUSR
10.0000 mL | Freq: Two times a day (BID) | ORAL | 1 refills | Status: DC
  Filled 2024-03-11 (×2): qty 100, 5d supply, fill #0
  Filled 2024-03-26: qty 100, 5d supply, fill #1
  Filled 2024-04-04 (×2): qty 100, 5d supply, fill #2

## 2024-03-11 NOTE — Telephone Encounter (Signed)
 Reviewed with Dr. Alvira Josephs.   Recommend evaluation by PCP.    Devki--I see lab orders with the infusion orders-do you know why these labs haven't been updated with her infusion?

## 2024-03-11 NOTE — Telephone Encounter (Signed)
 Last Fill: 12/07/2023  Labs: 12/29/2023 Sodium 133 Potassium 2.8 CO2 18 Glucose 116 Calcium  8.7 CBC WNL  Next Visit: 05/21/2024  Last Visit: 02/19/2024  DX: Rheumatoid arthritis involving multiple sites with positive rheumatoid factor   Current Dose per office note 02/19/2024: Arava  20 mg 1 tablet by mouth every other day   Okay to refill Arava  ?

## 2024-03-11 NOTE — Telephone Encounter (Signed)
 Contacted patient , she advised she is taking a  20mg  tablet every other day.   Patient would like to know if she should hold off on taking the Arava  due to just beginning put on an antibiotic for a dental issue? Please advise

## 2024-03-11 NOTE — Telephone Encounter (Signed)
 If there is an infection she will need to hold arava  and simponi  aria until the infection has resolved and the antibiotics are completed.  Recommend clearance by Dr. Gaston Karvonen prior to resuming.

## 2024-03-11 NOTE — Telephone Encounter (Signed)
 I called patient, patient verbalized understanding.

## 2024-03-11 NOTE — Telephone Encounter (Signed)
 Please clarify if she is taking arava  20 mg daily or every other day

## 2024-03-12 ENCOUNTER — Other Ambulatory Visit (HOSPITAL_COMMUNITY): Payer: Self-pay

## 2024-03-12 ENCOUNTER — Other Ambulatory Visit: Payer: Self-pay

## 2024-03-12 MED ORDER — CHLORHEXIDINE GLUCONATE 0.12 % MT SOLN
OROMUCOSAL | 0 refills | Status: DC
  Filled 2024-03-12: qty 473, 15d supply, fill #0

## 2024-03-13 ENCOUNTER — Other Ambulatory Visit (HOSPITAL_COMMUNITY): Payer: Self-pay

## 2024-03-14 ENCOUNTER — Encounter: Payer: Self-pay | Admitting: Family Medicine

## 2024-03-14 ENCOUNTER — Ambulatory Visit: Admitting: Family Medicine

## 2024-03-14 VITALS — BP 128/82 | HR 77 | Temp 98.3°F | Resp 18 | Ht 67.0 in | Wt 213.0 lb

## 2024-03-14 DIAGNOSIS — M810 Age-related osteoporosis without current pathological fracture: Secondary | ICD-10-CM

## 2024-03-14 DIAGNOSIS — I10 Essential (primary) hypertension: Secondary | ICD-10-CM | POA: Diagnosis not present

## 2024-03-14 DIAGNOSIS — E785 Hyperlipidemia, unspecified: Secondary | ICD-10-CM

## 2024-03-14 LAB — COMPREHENSIVE METABOLIC PANEL WITH GFR
AG Ratio: 1.5 (calc) (ref 1.0–2.5)
ALT: 35 U/L — ABNORMAL HIGH (ref 6–29)
AST: 20 U/L (ref 10–35)
Albumin: 4.2 g/dL (ref 3.6–5.1)
Alkaline phosphatase (APISO): 62 U/L (ref 37–153)
BUN: 19 mg/dL (ref 7–25)
CO2: 23 mmol/L (ref 20–32)
Calcium: 9.7 mg/dL (ref 8.6–10.4)
Chloride: 105 mmol/L (ref 98–110)
Creat: 0.65 mg/dL (ref 0.50–1.05)
Globulin: 2.8 g/dL (ref 1.9–3.7)
Glucose, Bld: 98 mg/dL (ref 65–99)
Potassium: 3.6 mmol/L (ref 3.5–5.3)
Sodium: 140 mmol/L (ref 135–146)
Total Bilirubin: 0.3 mg/dL (ref 0.2–1.2)
Total Protein: 7 g/dL (ref 6.1–8.1)
eGFR: 99 mL/min/{1.73_m2} (ref >=60)

## 2024-03-14 LAB — LIPID PANEL
Cholesterol: 253 mg/dL — ABNORMAL HIGH (ref <200)
HDL: 93 mg/dL (ref >=50)
LDL Cholesterol (Calc): 117 mg/dL — ABNORMAL HIGH
Non-HDL Cholesterol (Calc): 160 mg/dL — ABNORMAL HIGH (ref <130)
Total CHOL/HDL Ratio: 2.7 (calc) (ref <5.0)
Triglycerides: 281 mg/dL — ABNORMAL HIGH (ref <150)

## 2024-03-14 NOTE — Progress Notes (Signed)
 Established Patient Office Visit  Subjective   Patient ID: Sharon Hunter, female    DOB: 1961-09-01  Age: 63 y.o. MRN: [id number]  Chief Complaint  Patient presents with  . Joint Swelling    X2 months, left ankle, worse in the evening, pain with walking.     HPI Discussed the use of AI scribe software for clinical note transcription with the patient, who gave verbal consent to proceed.  History of Present Illness Discussed the use of AI scribe software for clinical note transcription with the patient, who gave verbal consent to proceed.  History of Present Illness   The patient, with rheumatoid arthritis, presents with flares and left ankle swelling.  The patient has been experiencing uncontrolled rheumatoid arthritis flares, particularly affecting the left ankle and right hip. Despite being on Arava  and Aurea infusions and recently weaned off steroids, she has had repeated flares every three to four days, which have now slowed, with the last flare occurring six to eight weeks ago. The left ankle swells significantly after about four hours of activity, although there is no associated bone pain, just swelling.  She has been bedridden for two days due to an infection in her left jaw, requiring apical surgery scheduled for the 23rd. This has led to a hold on her RA medications, and she is currently on antibiotics for cellulitis. She has a history of osteoporosis, which was discovered after losing a tooth due to bone loss, possibly related to long-term steroid use. She has doubled her intake of calcium , magnesium, vitamin D , and K to address this.  Significant anxiety has been present, for which she is taking Zoloft . She has also been dealing with the emotional impact of losing multiple family members recently, including her mother, sister, and brother, the latter of whom died from liver cancer. She is in the process of applying for disability Medicare due to her inability to work, having been  off work for three months. She has hired a Clinical research associate to assist with the disability claim process.  She has been trying to get a bone density test for three months, but the machine has been down. She has also experienced a low-grade fever earlier in the week, likely related to her current infection.        Patient Active Problem List   Diagnosis Date Noted  . Acute otitis media 08/11/2023  . Mood disorder (HCC)- Emotional Eating 08/02/2022  . Class 1 obesity with serious comorbidity and body mass index (BMI) of 30.0 to 30.9 in adult 08/02/2022  . At risk for impaired metabolic function 05/30/2022  . Perioral dermatitis 05/26/2022  . Preventative health care 05/26/2022  . Essential hypertension 05/26/2022  . Abnormal EKG 05/26/2022  . Prediabetes 03/09/2022  . Vitamin B12 deficiency 03/09/2022  . Primary hypertension 02/22/2022  . Hyperlipidemia 02/22/2022  . GAD (generalized anxiety disorder) 02/22/2022  . Vitamin D  deficiency 02/22/2022  . Ear pain, left 08/13/2021  . History of anxiety 03/27/2017  . History of hypertension 03/27/2017  . History of bundle branch block 03/27/2017  . Primary osteoarthritis of both hands 12/22/2016  . History of shingles 08/02/2016  . High risk medication use 08/02/2016  . Hypertension 08/01/2016  . IBS (irritable bowel syndrome) 08/01/2016  . Anxiety 08/01/2016  . 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 LBBB (left bundle branch block) 01/30/2015  . Palpitations 01/30/2015  . Pulmonary nodule 07/04/2011  . Murmur 02/03/2011  . Rheumatoid arthritis (HCC) 02/03/2011  . Hair loss  02/03/2011  . Fatigue 02/03/2011   Past Medical History:  Diagnosis Date  . Anxiety   . Breast lump    stable-- gets annual mammogram  . 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 Hypertension   . IBS (irritable bowel syndrome) 08/01/2016  . Joint pain   . Lactose intolerance   . Migraine   . Murmur 02/03/2011  . Obese   . Ovarian failure   .  Palpitation   . Pulmonary nodule, left 2009   last CT Nov 2011-- due 08/2011  . Rheumatoid arthritis(714.0)    in remission , off meds as of Summer 2012  . Shingles 08/2023  . Vitamin D  deficiency    Past Surgical History:  Procedure Laterality Date  . CHOLECYSTECTOMY  1999  . COLONOSCOPY  10/31/11  . EXPLORATORY LAPAROTOMY    . TONSILLECTOMY  1969  . WISDOM TOOTH EXTRACTION     Social History   Tobacco Use  . Smoking status: Former    Current packs/day: 0.00    Types: Cigarettes    Quit date: 10/03/1990    Years since quitting: 33.4    Passive exposure: Past  . Smokeless tobacco: Never  Vaping Use  . Vaping status: Never Used  Substance Use Topics  . Alcohol use: Yes    Alcohol/week: 3.0 standard drinks of alcohol    Types: 3 Standard drinks or equivalent per week    Comment: Red wine 4 oz occ  . Drug use: No   Social History   Socioeconomic History  . Marital status: Divorced    Spouse name: Not on file  . Number of children: 1  . Years of education: 63  . Highest education level: Not on file  Occupational History  . Occupation: Charity fundraiser  Tobacco Use  . Smoking status: Former    Current packs/day: 0.00    Types: Cigarettes    Quit date: 10/03/1990    Years since quitting: 33.4    Passive exposure: Past  . Smokeless tobacco: Never  Vaping Use  . Vaping status: Never Used  Substance and Sexual Activity  . Alcohol use: Yes    Alcohol/week: 3.0 standard drinks of alcohol    Types: 3 Standard drinks or equivalent per week    Comment: Red wine 4 oz occ  . Drug use: No  . Sexual activity: Not Currently    Partners: Male  Other Topics Concern  . Not on file  Social History Narrative   Exercise-- jogs   Social Drivers of Health   Financial Resource Strain: Not on file  Food Insecurity: Not on file  Transportation Needs: Not on file  Physical Activity: Not on file  Stress: Not on file  Social Connections: Not on file  Intimate Partner Violence: Not on file    Family Status  Relation Name Status  . Mother  Deceased  . Father  Deceased  . Sister  Deceased at age 21  . Brother  Deceased  . Brother  Deceased at age 53       lung cancer  . Mat Aunt  (Not Specified)  No partnership data on file   Family History  Problem Relation Age of Onset  . Cancer Mother   . Arthritis Mother   . Uterine cancer Mother   . Hypertension Mother   . Diabetes Mother   . Esophageal cancer Mother   . Other Mother        small bowel obstruction  . Alcohol abuse Father   .  Hyperlipidemia Father   . Heart disease Father   . High blood pressure Father   . Sleep apnea Father   . Uterine cancer Sister   . Heart disease Sister        cardiac stent  . Diabetes Sister   . Hyperlipidemia Sister   . Hypertension Sister   . Stroke Sister   . Alcohol abuse Brother   . Diabetes Brother   . Hypertension Brother   . Liver cancer Brother   . Cancer Brother        lung  . Cancer Maternal Aunt        lung----non smoker   Allergies  Allergen Reactions  . Other Swelling    Contrast dye- head swelling per pt.  . Contrast Media [Iodinated Contrast Media]   . Rosuvastatin  Diarrhea      Review of Systems  Constitutional:  Negative for fever and malaise/fatigue.  HENT:  Negative for congestion.   Eyes:  Negative for blurred vision.  Respiratory:  Negative for cough and shortness of breath.   Cardiovascular:  Positive for leg swelling. Negative for chest pain and palpitations.  Gastrointestinal:  Negative for vomiting.  Musculoskeletal:  Negative for back pain.  Skin:  Negative for rash.  Neurological:  Negative for loss of consciousness and headaches.     Objective:     BP 128/82 (BP Location: Left Arm, Patient Position: Sitting, Cuff Size: Normal)   Pulse 77   Temp 98.3 F (36.8 C) (Oral)   Resp 18   Ht 5' 7 (1.702 m)   Wt 213 lb (96.6 kg)   LMP 10/03/2005   SpO2 97%   BMI 33.36 kg/m  BP Readings from Last 3 Encounters:  03/14/24 128/82   02/19/24 125/82  02/02/24 139/88   Wt Readings from Last 3 Encounters:  03/14/24 213 lb (96.6 kg)  02/19/24 213 lb 9.6 oz (96.9 kg)  02/02/24 200 lb (90.7 kg)   SpO2 Readings from Last 3 Encounters:  03/14/24 97%  02/02/24 100%  01/05/24 100%      Physical Exam Vitals and nursing note reviewed.  Constitutional:      General: She is not in acute distress.    Appearance: Normal appearance. She is well-developed.  HENT:     Head: Normocephalic and atraumatic.   Eyes:     General: No scleral icterus.       Right eye: No discharge.        Left eye: No discharge.    Cardiovascular:     Rate and Rhythm: Normal rate and regular rhythm.     Heart sounds: No murmur heard. Pulmonary:     Effort: Pulmonary effort is normal. No respiratory distress.     Breath sounds: Normal breath sounds.   Musculoskeletal:        General: Normal range of motion.     Cervical back: Normal range of motion and neck supple.     Right lower leg: No edema.     Left lower leg: No edema.   Skin:    General: Skin is warm and dry.   Neurological:     Mental Status: She is alert and oriented to person, place, and time.   Psychiatric:        Mood and Affect: Mood normal.        Behavior: Behavior normal.        Thought Content: Thought content normal.        Judgment: Judgment normal.  Results for orders placed or performed in visit on 03/14/24  Lipid panel  Result Value Ref Range   Cholesterol 253 (H) <200 mg/dL   HDL 93 > OR = 50 mg/dL   Triglycerides 161 (H) <150 mg/dL   LDL Cholesterol (Calc) 117 (H) mg/dL (calc)   Total CHOL/HDL Ratio 2.7 <5.0 (calc)   Non-HDL Cholesterol (Calc) 160 (H) <130 mg/dL (calc)  Comprehensive metabolic panel with GFR  Result Value Ref Range   Glucose, Bld 98 65 - 99 mg/dL   BUN 19 7 - 25 mg/dL   Creat 0.96 0.45 - 4.09 mg/dL   eGFR 99 > OR = 60 WJ/XBJ/4.78G9   BUN/Creatinine Ratio SEE NOTE: 6 - 22 (calc)   Sodium 140 135 - 146 mmol/L   Potassium  3.6 3.5 - 5.3 mmol/L   Chloride 105 98 - 110 mmol/L   CO2 23 20 - 32 mmol/L   Calcium  9.7 8.6 - 10.4 mg/dL   Total Protein 7.0 6.1 - 8.1 g/dL   Albumin 4.2 3.6 - 5.1 g/dL   Globulin 2.8 1.9 - 3.7 g/dL (calc)   AG Ratio 1.5 1.0 - 2.5 (calc)   Total Bilirubin 0.3 0.2 - 1.2 mg/dL   Alkaline phosphatase (APISO) 62 37 - 153 U/L   AST 20 10 - 35 U/L   ALT 35 (H) 6 - 29 U/L    Last CBC Lab Results  Component Value Date   WBC 5.4 12/29/2023   HGB 14.2 12/29/2023   HCT 41.1 12/29/2023   MCV 88.4 12/29/2023   MCH 30.5 12/29/2023   RDW 12.8 12/29/2023   PLT 237 12/29/2023   Last metabolic panel Lab Results  Component Value Date   GLUCOSE 98 03/14/2024   NA 140 03/14/2024   K 3.6 03/14/2024   CL 105 03/14/2024   CO2 23 03/14/2024   BUN 19 03/14/2024   CREATININE 0.65 03/14/2024   GFRNONAA >60 12/29/2023   CALCIUM  9.7 03/14/2024   PROT 7.0 03/14/2024   ALBUMIN 3.5 12/29/2023   LABGLOB 2.6 07/28/2023   AGRATIO 2.0 02/22/2022   BILITOT 0.3 03/14/2024   ALKPHOS 59 12/29/2023   AST 20 03/14/2024   ALT 35 (H) 03/14/2024   ANIONGAP 15 12/29/2023   Last lipids Lab Results  Component Value Date   CHOL 253 (H) 03/14/2024   HDL 93 03/14/2024   LDLCALC 117 (H) 03/14/2024   LDLDIRECT 132.6 05/23/2013   TRIG 281 (H) 03/14/2024   CHOLHDL 2.7 03/14/2024   Last hemoglobin A1c Lab Results  Component Value Date   HGBA1C 5.9 (H) 11/28/2022   Last thyroid  functions Lab Results  Component Value Date   TSH 0.753 02/22/2022   Last vitamin D  Lab Results  Component Value Date   VD25OH 30.1 11/28/2022   Last vitamin B12 and Folate Lab Results  Component Value Date   VITAMINB12 1,042 05/30/2022      The 10-year ASCVD risk score (Arnett DK, et al., 2019) is: 4.7%    Assessment & Plan:   Problem List Items Addressed This Visit       Unprioritized   Primary hypertension - Primary   Relevant Orders   Lipid panel (Completed)   Hyperlipidemia   Relevant Orders   Lipid  panel (Completed)   Other Visit Diagnoses       Osteoporosis, unspecified osteoporosis type, unspecified pathological fracture presence       Relevant Orders   DG Bone Density   Comprehensive metabolic panel with GFR (Completed)  Assessment and Plan    Rheumatoid Arthritis   Rheumatoid arthritis is currently uncontrolled with repeated flares, particularly affecting the left ankle and right hip. Arava  and Aurea infusions have reduced flare frequency, but all RA medications are on hold due to a current infection. Swelling in the left ankle worsens with activity but is not associated with bone pain. She has been off steroids for eight weeks. Document left ankle swelling for disability application. Resume RA medications post-infection clearance with a note from the dentist.  Cellulitis of the Jaw   Cellulitis in the left jaw requires apical surgery scheduled for June 23. RA medications are ceased to prevent complications. She is on antibiotics to manage the infection. Continue antibiotics for cellulitis. Proceed with apical surgery on June 23.  Osteoporosis   Osteoporosis likely secondary to long-term steroid use for rheumatoid arthritis. Bone loss is evidenced by tooth loss due to bone loss rather than dental decay. She is taking calcium , magnesium, vitamin D3, and K2 supplements. A bone density scan is pending due to equipment issues, and alternative locations are being sought. Attempt to schedule a bone density scan at [place] or another location due to equipment issues at the current facility.  Anxiety   Anxiety is likely exacerbated by recent family losses and health issues. Zoloft  is helping to manage symptoms. Significant family loss includes recent deaths of her mother, sister, and brother. Continue Zoloft  for anxiety management.  General Health Maintenance   Significant family history of cancer and interest in genetic testing for cancer risk. Routine blood work is due to monitor  vitamin and mineral levels. She has been advised to sign up for genetic testing through MyChart, expected to be available next month. Check blood work for calcium , vitamin D , B12, and potassium levels. Advise to sign up for genetic testing for cancer risk through MyChart.  Follow-up   Navigating disability applications and nearing the end of short-term disability. Awaiting a decision on long-term disability coverage with legal assistance. Follow up on disability application status. Monitor for updates on long-term disability coverage.        Return if symptoms worsen or fail to improve.    Ellianna Ruest R Lowne Chase, DO

## 2024-03-15 NOTE — Patient Instructions (Signed)
Edema  Edema is an abnormal buildup of fluids in the body tissues and under the skin. Swelling of the legs, feet, and ankles is a common symptom that becomes more likely as you get older. Swelling is also common in looser tissues, such as around the eyes. Pressing on the area may make a temporary dent in your skin (pitting edema). This fluid may also accumulate in your lungs (pulmonary edema). There are many possible causes of edema. Eating too much salt (sodium) and being on your feet or sitting for a long time can cause edema in your legs, feet, and ankles. Common causes of edema include: Certain medical conditions, such as heart failure, liver or kidney disease, and cancer. Weak leg blood vessels. An injury. Pregnancy. Medicines. Being obese. Low protein levels in the blood. Hot weather may make edema worse. Edema is usually painless. Your skin may look swollen or shiny. Follow these instructions at home: Medicines Take over-the-counter and prescription medicines only as told by your health care provider. Your health care provider may prescribe a medicine to help your body get rid of extra water (diuretic). Take this medicine if you are told to take it. Eating and drinking Eat a low-salt (low-sodium) diet to reduce fluid as told by your health care provider. Sometimes, eating less salt may reduce swelling. Depending on the cause of your swelling, you may need to limit how much fluid you drink (fluid restriction). General instructions Raise (elevate) the injured area above the level of your heart while you are sitting or lying down. Do not sit still or stand for long periods of time. Do not wear tight clothing. Do not wear garters on your upper legs. Exercise your legs to get your circulation going. This helps to move the fluid back into your blood vessels, and it may help the swelling go down. Wear compression stockings as told by your health care provider. These stockings help to prevent  blood clots and reduce swelling in your legs. It is important that these are the correct size. These stockings should be prescribed by your health care provider to prevent possible injuries. If elastic bandages or wraps are recommended, use them as told by your health care provider. Contact a health care provider if: Your edema does not get better with treatment. You have heart, liver, or kidney disease and have symptoms of edema. You have sudden and unexplained weight gain. Get help right away if: You develop shortness of breath or chest pain. You cannot breathe when you lie down. You develop pain, redness, or warmth in the swollen areas. You have heart, liver, or kidney disease and suddenly get edema. You have a fever and your symptoms suddenly get worse. These symptoms may be an emergency. Get help right away. Call 911. Do not wait to see if the symptoms will go away. Do not drive yourself to the hospital. Summary Edema is an abnormal buildup of fluids in the body tissues and under the skin. Eating too much salt (sodium)and being on your feet or sitting for a long time can cause edema in your legs, feet, and ankles. Raise (elevate) the injured area above the level of your heart while you are sitting or lying down. Follow your health care provider's instructions about diet and how much fluid you can drink. This information is not intended to replace advice given to you by your health care provider. Make sure you discuss any questions you have with your health care provider. Document Revised: 05/24/2021 Document  Reviewed: 05/24/2021 Elsevier Patient Education  2024 [employer].

## 2024-03-18 ENCOUNTER — Other Ambulatory Visit: Payer: Self-pay

## 2024-03-18 ENCOUNTER — Other Ambulatory Visit (HOSPITAL_COMMUNITY): Payer: Self-pay

## 2024-03-18 MED ORDER — AMOXICILLIN-POT CLAVULANATE 400-57 MG/5ML PO SUSR
10.0000 mL | Freq: Two times a day (BID) | ORAL | 1 refills | Status: DC
  Filled 2024-03-18 (×2): qty 100, 5d supply, fill #0

## 2024-03-19 ENCOUNTER — Other Ambulatory Visit (HOSPITAL_COMMUNITY): Payer: Self-pay

## 2024-03-20 ENCOUNTER — Other Ambulatory Visit (HOSPITAL_COMMUNITY): Payer: Self-pay

## 2024-03-20 ENCOUNTER — Other Ambulatory Visit: Payer: Self-pay | Admitting: Family Medicine

## 2024-03-20 MED ORDER — AMLODIPINE BESYLATE 5 MG PO TABS
5.0000 mg | ORAL_TABLET | Freq: Every day | ORAL | 3 refills | Status: AC
  Filled 2024-03-20 – 2024-05-23 (×2): qty 90, 90d supply, fill #0
  Filled 2024-09-28: qty 90, 90d supply, fill #1

## 2024-03-22 ENCOUNTER — Encounter: Payer: Self-pay | Admitting: Rheumatology

## 2024-03-23 ENCOUNTER — Ambulatory Visit: Payer: Self-pay | Admitting: Family Medicine

## 2024-03-25 ENCOUNTER — Other Ambulatory Visit (HOSPITAL_COMMUNITY): Payer: Self-pay

## 2024-03-26 ENCOUNTER — Other Ambulatory Visit: Payer: Self-pay

## 2024-03-26 ENCOUNTER — Other Ambulatory Visit (HOSPITAL_COMMUNITY): Payer: Self-pay

## 2024-03-27 ENCOUNTER — Other Ambulatory Visit (HOSPITAL_COMMUNITY): Payer: Self-pay

## 2024-03-29 ENCOUNTER — Encounter (HOSPITAL_COMMUNITY)

## 2024-04-02 NOTE — Telephone Encounter (Signed)
 I returned patient's call.  Patient states that she had oral surgery and developed infection after that.  She has been off Simponi  for almost a month now.  She has developed a flare and started taking prednisone .  Patient states that she gets frequent infections.  She plans to restart Simponi  after she gets clearance from Dr. Elpidio, her oral surgeon.

## 2024-04-03 ENCOUNTER — Encounter: Payer: Self-pay | Admitting: Pharmacist

## 2024-04-03 ENCOUNTER — Other Ambulatory Visit: Payer: Self-pay

## 2024-04-03 ENCOUNTER — Other Ambulatory Visit (HOSPITAL_COMMUNITY): Payer: Self-pay

## 2024-04-03 MED ORDER — METHYLPREDNISOLONE 4 MG PO TBPK
ORAL_TABLET | ORAL | 1 refills | Status: DC
  Filled 2024-04-03: qty 21, 6d supply, fill #0

## 2024-04-04 ENCOUNTER — Other Ambulatory Visit (HOSPITAL_COMMUNITY): Payer: Self-pay

## 2024-04-11 ENCOUNTER — Other Ambulatory Visit: Payer: Self-pay

## 2024-04-15 ENCOUNTER — Other Ambulatory Visit (HOSPITAL_COMMUNITY): Payer: Self-pay

## 2024-04-17 ENCOUNTER — Ambulatory Visit

## 2024-04-17 DIAGNOSIS — M810 Age-related osteoporosis without current pathological fracture: Secondary | ICD-10-CM | POA: Diagnosis not present

## 2024-04-23 ENCOUNTER — Other Ambulatory Visit (HOSPITAL_BASED_OUTPATIENT_CLINIC_OR_DEPARTMENT_OTHER): Payer: Self-pay

## 2024-04-23 ENCOUNTER — Encounter: Payer: Self-pay | Admitting: Family Medicine

## 2024-04-23 ENCOUNTER — Ambulatory Visit: Admitting: Family Medicine

## 2024-04-23 VITALS — BP 128/80 | HR 76 | Temp 98.5°F | Resp 18 | Ht 67.0 in | Wt 215.4 lb

## 2024-04-23 DIAGNOSIS — F419 Anxiety disorder, unspecified: Secondary | ICD-10-CM | POA: Diagnosis not present

## 2024-04-23 DIAGNOSIS — F418 Other specified anxiety disorders: Secondary | ICD-10-CM | POA: Insufficient documentation

## 2024-04-23 DIAGNOSIS — M8580 Other specified disorders of bone density and structure, unspecified site: Secondary | ICD-10-CM | POA: Insufficient documentation

## 2024-04-23 MED ORDER — ESCITALOPRAM OXALATE 10 MG PO TABS
10.0000 mg | ORAL_TABLET | Freq: Every day | ORAL | 2 refills | Status: DC
  Filled 2024-04-23: qty 30, 30d supply, fill #0

## 2024-04-23 NOTE — Assessment & Plan Note (Signed)
 Lexapro  10 mg daily  F/u 1 month or sooner as needed

## 2024-04-23 NOTE — Progress Notes (Addendum)
 Established Patient Office Visit  Subjective   Patient ID: Sharon Hunter, female    DOB: 11-19-1960  Age: 63 y.o. MRN: [id number]  Chief Complaint  Patient presents with   Anxiety   Follow-up    HPI Discussed the use of AI scribe software for clinical note transcription with the patient, who gave verbal consent to proceed.  History of Present Illness Sharon Hunter is a 63 year old female who presents with restlessness and sleep disturbances at night.  She experiences significant restlessness and fidgetiness at night, which negatively impacts her sleep quality. She has previously tried medications such as Wellbutrin and Celexa but is uncertain about their effectiveness. She is considering switching to Lexapro .  She has a history of osteopenia and is on a supplement regimen that includes calcium , vitamin D3, K2, B12, and Z. She takes calcium  and vitamin D3 with K2 twice daily, and B12 and Z once daily. Additionally, she mentions that her nails and hair have been growing faster, which she attributes to her supplements.   Patient Active Problem List   Diagnosis Date Noted   Osteopenia 04/23/2024   Depression with anxiety 04/23/2024   Acute otitis media 08/11/2023   Mood disorder (HCC)- Emotional Eating 08/02/2022   Class 1 obesity with serious comorbidity and body mass index (BMI) of 30.0 to 30.9 in adult 08/02/2022   At risk for impaired metabolic function 05/30/2022   Perioral dermatitis 05/26/2022   Preventative health care 05/26/2022   Essential hypertension 05/26/2022   Abnormal EKG 05/26/2022   Prediabetes 03/09/2022   Vitamin B12 deficiency 03/09/2022   Primary hypertension 02/22/2022   Hyperlipidemia 02/22/2022   GAD (generalized anxiety disorder) 02/22/2022   Vitamin D  deficiency 02/22/2022   Ear pain, left 08/13/2021   History of anxiety 03/27/2017   History of hypertension 03/27/2017   History of bundle branch block 03/27/2017   Primary osteoarthritis of  both hands 12/22/2016   History of shingles 08/02/2016   High risk medication use 08/02/2016   Hypertension 08/01/2016   IBS (irritable bowel syndrome) 08/01/2016   Anxiety 08/01/2016   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   LBBB (left bundle branch block) 01/30/2015   Palpitations 01/30/2015   Pulmonary nodule 07/04/2011   Murmur 02/03/2011   Rheumatoid arthritis (HCC) 02/03/2011   Hair loss 02/03/2011   Fatigue 02/03/2011   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   Hypertension    IBS (irritable bowel syndrome) 08/01/2016   Joint pain    Lactose intolerance    Migraine    Murmur 02/03/2011   Obese    Ovarian failure    Palpitation    Pulmonary nodule, left 2009   last CT Nov 2011-- due 08/2011   Rheumatoid arthritis(714.0)    in remission , off meds as of Summer 2012   Shingles 08/2023   Vitamin D  deficiency    Past Surgical History:  Procedure Laterality Date   CHOLECYSTECTOMY  1999   COLONOSCOPY  10/31/11   EXPLORATORY LAPAROTOMY     TONSILLECTOMY  1969   WISDOM TOOTH EXTRACTION     Social History   Tobacco Use   Smoking status: Former    Current packs/day: 0.00    Types: Cigarettes    Quit date: 10/03/1990    Years since quitting: 33.5    Passive exposure: Past   Smokeless  tobacco: Never  Vaping Use   Vaping status: Never Used  Substance Use Topics   Alcohol use: Yes    Alcohol/week: 3.0 standard drinks of alcohol    Types: 3 Standard drinks or equivalent per week    Comment: Red wine 4 oz occ   Drug use: No   Social History   Socioeconomic History   Marital status: Divorced    Spouse name: Not on file   Number of children: 1   Years of education: 17   Highest education level: Not on file  Occupational History   Occupation: RN  Tobacco Use   Smoking status: Former    Current packs/day: 0.00    Types: Cigarettes    Quit date:  10/03/1990    Years since quitting: 33.5    Passive exposure: Past   Smokeless tobacco: Never  Vaping Use   Vaping status: Never Used  Substance and Sexual Activity   Alcohol use: Yes    Alcohol/week: 3.0 standard drinks of alcohol    Types: 3 Standard drinks or equivalent per week    Comment: Red wine 4 oz occ   Drug use: No   Sexual activity: Not Currently    Partners: Male  Other Topics Concern   Not on file  Social History Narrative   Exercise-- jogs   Social Drivers of Health   Financial Resource Strain: Not on file  Food Insecurity: Not on file  Transportation Needs: Not on file  Physical Activity: Not on file  Stress: Not on file  Social Connections: Not on file  Intimate Partner Violence: Not on file   Family Status  Relation Name Status   Mother  Deceased   Father  Deceased   Sister  Deceased at age 51   Brother  Deceased   Brother  Deceased at age 63       lung cancer   Mat Aunt  (Not Specified)  No partnership data on file   Family History  Problem Relation Age of Onset   Cancer Mother    Arthritis Mother    Uterine cancer Mother    Hypertension Mother    Diabetes Mother    Esophageal cancer Mother    Other Mother        small bowel obstruction   Alcohol abuse Father    Hyperlipidemia Father    Heart disease Father    High blood pressure Father    Sleep apnea Father    Uterine cancer Sister    Heart disease Sister        cardiac stent   Diabetes Sister    Hyperlipidemia Sister    Hypertension Sister    Stroke Sister    Alcohol abuse Brother    Diabetes Brother    Hypertension Brother    Liver cancer Brother    Cancer Brother        lung   Cancer Maternal Aunt        lung----non smoker   Allergies  Allergen Reactions   Other Swelling    Contrast dye- head swelling per pt.   Contrast Media [Iodinated Contrast Media]    Rosuvastatin  Diarrhea      Review of Systems  Constitutional:  Negative for fever and malaise/fatigue.  HENT:   Negative for congestion.   Eyes:  Negative for blurred vision.  Respiratory:  Negative for shortness of breath.   Cardiovascular:  Negative for chest pain, palpitations and leg swelling.  Gastrointestinal:  Negative for abdominal pain,  blood in stool and nausea.  Genitourinary:  Negative for dysuria and frequency.  Musculoskeletal:  Negative for falls.  Skin:  Negative for rash.  Neurological:  Negative for dizziness, loss of consciousness and headaches.  Endo/Heme/Allergies:  Negative for environmental allergies.  Psychiatric/Behavioral:  Positive for depression. Negative for suicidal ideas. The patient is nervous/anxious.       Objective:     BP 128/80 (BP Location: Left Arm, Patient Position: Sitting, Cuff Size: Normal)   Pulse 76   Temp 98.5 F (36.9 C) (Oral)   Resp 18   Ht 5' 7 (1.702 m)   Wt 215 lb 6.4 oz (97.7 kg)   LMP 10/03/2005   SpO2 97%   BMI 33.74 kg/m  BP Readings from Last 3 Encounters:  04/23/24 128/80  03/14/24 128/82  02/19/24 125/82   Wt Readings from Last 3 Encounters:  04/23/24 215 lb 6.4 oz (97.7 kg)  03/14/24 213 lb (96.6 kg)  02/19/24 213 lb 9.6 oz (96.9 kg)   SpO2 Readings from Last 3 Encounters:  04/23/24 97%  03/14/24 97%  02/02/24 100%      Physical Exam   No results found for any visits on 04/23/24.  Last CBC Lab Results  Component Value Date   WBC 5.4 12/29/2023   HGB 14.2 12/29/2023   HCT 41.1 12/29/2023   MCV 88.4 12/29/2023   MCH 30.5 12/29/2023   RDW 12.8 12/29/2023   PLT 237 12/29/2023   Last metabolic panel Lab Results  Component Value Date   GLUCOSE 98 03/14/2024   NA 140 03/14/2024   K 3.6 03/14/2024   CL 105 03/14/2024   CO2 23 03/14/2024   BUN 19 03/14/2024   CREATININE 0.65 03/14/2024   EGFR 99 03/14/2024   CALCIUM  9.7 03/14/2024   PROT 7.0 03/14/2024   ALBUMIN 3.5 12/29/2023   LABGLOB 2.6 07/28/2023   AGRATIO 2.0 02/22/2022   BILITOT 0.3 03/14/2024   ALKPHOS 59 12/29/2023   AST 20 03/14/2024    ALT 35 (H) 03/14/2024   ANIONGAP 15 12/29/2023   Last lipids Lab Results  Component Value Date   CHOL 253 (H) 03/14/2024   HDL 93 03/14/2024   LDLCALC 117 (H) 03/14/2024   LDLDIRECT 132.6 05/23/2013   TRIG 281 (H) 03/14/2024   CHOLHDL 2.7 03/14/2024   Last hemoglobin A1c Lab Results  Component Value Date   HGBA1C 5.9 (H) 11/28/2022   Last thyroid  functions Lab Results  Component Value Date   TSH 0.753 02/22/2022   Last vitamin D  Lab Results  Component Value Date   VD25OH 30.1 11/28/2022   Last vitamin B12 and Folate Lab Results  Component Value Date   VITAMINB12 1,042 05/30/2022      The 10-year ASCVD risk score (Arnett DK, et al., 2019) is: 5.3%    Assessment & Plan:   Problem List Items Addressed This Visit       Unprioritized   Osteopenia   Con't calcium  and vita d  Weight bearing exercise Recheck 2 years       Depression with anxiety - Primary   Relevant Medications   escitalopram  (LEXAPRO ) 10 MG tablet   Anxiety   Lexapro  10 mg daily  F/u 1 month or sooner as needed       Relevant Medications   escitalopram  (LEXAPRO ) 10 MG tablet  Assessment and Plan Assessment & Plan Depression   She experiences restlessness and fidgetiness at night. Previously tried Wellbutrin and Celexa. Lexapro , an SSRI, is recommended to alleviate  symptoms. It may cause tiredness, but nighttime dosing could help. Onset of effect ranges from 2 weeks to 2 months. A direct switch to Lexapro  is feasible. Prescribe Lexapro  10 mg daily at night. Follow up in one month to assess effectiveness and adjust the dose if necessary.  Osteopenia   Bone density scan shows baseline osteopenia, an improvement from previous results. She is currently on calcium , vitamin D3, and K2 supplements. Emphasize weight-bearing exercises to enhance bone density. Continue current supplementation and engage in exercises such as walking. Recheck bone density in two years.  General Health Maintenance    She is considering joining a gym with a warm pool for exercise and potential rehabilitation. Taking B12 supplements has improved nail and hair growth, indicating a positive response to the regimen. Consider joining a gym with a warm pool for exercise and rehabilitation needs.    Return in about 4 weeks (around 05/21/2024), or if symptoms worsen or fail to improve.    Petro Talent R Lowne Chase, DO

## 2024-04-23 NOTE — Assessment & Plan Note (Signed)
 Con't calcium  and vita d  Weight bearing exercise Recheck 2 years

## 2024-04-23 NOTE — Patient Instructions (Signed)

## 2024-05-01 ENCOUNTER — Telehealth: Payer: Self-pay | Admitting: Rheumatology

## 2024-05-01 DIAGNOSIS — Z79899 Other long term (current) drug therapy: Secondary | ICD-10-CM

## 2024-05-01 NOTE — Telephone Encounter (Signed)
 Will patient need labs prior to restarting her infusions?

## 2024-05-01 NOTE — Telephone Encounter (Signed)
 Patient called stating she has an appointment scheduled with her oral surgeon on Thursday, 05/02/24 who she is hoping will clear her to restart her Simponi  infusions.  Patient requested a return call to discuss if labs are needed before she schedules the infusion.

## 2024-05-01 NOTE — Telephone Encounter (Signed)
 Please get CBC and CMP prior to the infusion.

## 2024-05-01 NOTE — Telephone Encounter (Signed)
 Patient advised she will need CBC and CMP prior to the infusion. Patient plans to come to office to have them drawn. Future orders placed.

## 2024-05-02 ENCOUNTER — Other Ambulatory Visit: Payer: Self-pay | Admitting: *Deleted

## 2024-05-02 DIAGNOSIS — Z79899 Other long term (current) drug therapy: Secondary | ICD-10-CM

## 2024-05-02 DIAGNOSIS — Z7952 Long term (current) use of systemic steroids: Secondary | ICD-10-CM

## 2024-05-02 LAB — COMPREHENSIVE METABOLIC PANEL WITH GFR
AG Ratio: 1.8 (calc) (ref 1.0–2.5)
ALT: 23 U/L (ref 6–29)
AST: 11 U/L (ref 10–35)
Albumin: 4.4 g/dL (ref 3.6–5.1)
Alkaline phosphatase (APISO): 70 U/L (ref 37–153)
BUN: 12 mg/dL (ref 7–25)
CO2: 28 mmol/L (ref 20–32)
Calcium: 9.5 mg/dL (ref 8.6–10.4)
Chloride: 102 mmol/L (ref 98–110)
Creat: 0.68 mg/dL (ref 0.50–1.05)
Globulin: 2.5 g/dL (ref 1.9–3.7)
Glucose, Bld: 93 mg/dL (ref 65–99)
Potassium: 4.4 mmol/L (ref 3.5–5.3)
Sodium: 138 mmol/L (ref 135–146)
Total Bilirubin: 0.4 mg/dL (ref 0.2–1.2)
Total Protein: 6.9 g/dL (ref 6.1–8.1)
eGFR: 98 mL/min/1.73m2 (ref >=60)

## 2024-05-02 LAB — CBC WITH DIFFERENTIAL/PLATELET
Absolute Lymphocytes: 2008 {cells}/uL (ref 850–3900)
Absolute Monocytes: 613 {cells}/uL (ref 200–950)
Basophils Absolute: 44 {cells}/uL (ref 0–200)
Basophils Relative: 0.6 %
Eosinophils Absolute: 219 {cells}/uL (ref 15–500)
Eosinophils Relative: 3 %
HCT: 40.6 % (ref 35.0–45.0)
Hemoglobin: 13.1 g/dL (ref 11.7–15.5)
MCH: 29.8 pg (ref 27.0–33.0)
MCHC: 32.3 g/dL (ref 32.0–36.0)
MCV: 92.3 fL (ref 80.0–100.0)
MPV: 10.1 fL (ref 7.5–12.5)
Monocytes Relative: 8.4 %
Neutro Abs: 4417 {cells}/uL (ref 1500–7800)
Neutrophils Relative %: 60.5 %
Platelets: 345 Thousand/uL (ref 140–400)
RBC: 4.4 Million/uL (ref 3.80–5.10)
RDW: 12.2 % (ref 11.0–15.0)
Total Lymphocyte: 27.5 %
WBC: 7.3 Thousand/uL (ref 3.8–10.8)

## 2024-05-02 LAB — HEMOGLOBIN A1C
Hgb A1c MFr Bld: 5.9 % — ABNORMAL HIGH (ref <5.7)
Mean Plasma Glucose: 123 mg/dL
eAG (mmol/L): 6.8 mmol/L

## 2024-05-03 ENCOUNTER — Other Ambulatory Visit: Payer: Self-pay | Admitting: Pharmacist

## 2024-05-03 ENCOUNTER — Ambulatory Visit: Admitting: Physician Assistant

## 2024-05-03 ENCOUNTER — Encounter: Payer: Self-pay | Admitting: Pharmacist

## 2024-05-03 ENCOUNTER — Telehealth: Payer: Self-pay | Admitting: Pharmacist

## 2024-05-03 DIAGNOSIS — M0579 Rheumatoid arthritis with rheumatoid factor of multiple sites without organ or systems involvement: Secondary | ICD-10-CM

## 2024-05-03 DIAGNOSIS — Z79899 Other long term (current) drug therapy: Secondary | ICD-10-CM

## 2024-05-03 DIAGNOSIS — Z111 Encounter for screening for respiratory tuberculosis: Secondary | ICD-10-CM

## 2024-05-03 NOTE — Progress Notes (Signed)
 Next infusion for Simponi  Aria [phone]) not yet scheduled and due for updated orders. She has been cleared to resume infusions Diagnosis: RA  Dose: 2mg /kg every 8 weeks  Last Clinic Visit: 02/19/2024 Next Clinic Visit: 05/21/2024  Last infusion: 02/02/2024  Labs: CBC and CMP on 05/02/2024 TB Gold: negative 07/28/2023   Orders placed for Simponi  Aria (J1602) x 2 doses along with premedication of acetaminophen  650mg  p.o. and diphenhydramine  25mg  p.o. to be administered 30 minutes before medication infusion.  Standing CBC with diff/platelet and CMP with GFR orders placed to be drawn every 2 months.  Next TB gold due October 2025  Patient notified via MyChart to call and schedule infusion  Leyda Vanderwerf, PharmD, MPH, BCPS, CPP Clinical Pharmacist (Rheumatology and Pulmonology)

## 2024-05-03 NOTE — Telephone Encounter (Signed)
-----   Message from Sherry GORMAN Pennant sent at 05/02/2024  4:08 PM EDT -----  ----- Message ----- From: Cena Alfonso CROME, LPN Sent: 2/68/7974   3:54 PM EDT To: Rx Rheum/Pulm  Received clearance from oral surgeon, patient is cleared to resume infusions. Patient updated CBC/CMP as requested by Dr. Dolphus prior to resuming infusions.

## 2024-05-06 ENCOUNTER — Telehealth (HOSPITAL_COMMUNITY): Payer: Self-pay

## 2024-05-06 ENCOUNTER — Ambulatory Visit: Payer: Self-pay | Admitting: Physician Assistant

## 2024-05-06 NOTE — Telephone Encounter (Signed)
 Auth Submission: APPROVED Site of care: Site of care: [facility] Payer: Community education officer [facility] Employee) Medication & CPT/J Code(s) submitted: Simponi  aria (Golimumab ) N032161 Diagnosis Code:  Route of submission (phone, fax, portal):  Phone # Fax # Auth type: Buy/Bill HB Units/visits requested: 2mg /kg q 8 weeks Reference number: [id number] Approval from: 12/29/23 to 12/28/24

## 2024-05-07 ENCOUNTER — Ambulatory Visit (HOSPITAL_COMMUNITY)
Admission: RE | Admit: 2024-05-07 | Discharge: 2024-05-07 | Disposition: A | Discharge status: Home / Self Care | Source: Ambulatory Visit | Attending: Rheumatology | Admitting: Rheumatology

## 2024-05-07 ENCOUNTER — Encounter: Payer: Self-pay | Admitting: Rheumatology

## 2024-05-07 DIAGNOSIS — Z79899 Other long term (current) drug therapy: Secondary | ICD-10-CM | POA: Diagnosis not present

## 2024-05-07 DIAGNOSIS — M0579 Rheumatoid arthritis with rheumatoid factor of multiple sites without organ or systems involvement: Secondary | ICD-10-CM | POA: Diagnosis not present

## 2024-05-07 DIAGNOSIS — Z111 Encounter for screening for respiratory tuberculosis: Secondary | ICD-10-CM | POA: Diagnosis not present

## 2024-05-07 MED ORDER — DIPHENHYDRAMINE HCL 25 MG PO CAPS: 25.0000 mg | ORAL_CAPSULE | ORAL | Status: DC

## 2024-05-07 MED ORDER — ACETAMINOPHEN 325 MG PO TABS: 650.0000 mg | ORAL_TABLET | ORAL | Status: DC

## 2024-05-07 MED ORDER — SODIUM CHLORIDE 0.9 % IV SOLN
2.0000 mg/kg | INTRAVENOUS | Status: DC
  Administered 2024-05-07: 195 mg via INTRAVENOUS
  Filled 2024-05-07: qty 15.6

## 2024-05-07 NOTE — Progress Notes (Deleted)
 Office Visit Note  Patient: Sharon Hunter             Date of Birth: April 28, 1961           MRN: [id number]             PCP: Antonio Cyndee Jamee JONELLE, DO Referring: Antonio Cyndee Jamee JONELLE, * Visit Date: 05/21/2024 Occupation: @GUAROCC @  Subjective:    History of Present Illness: SOLE LENGACHER is a 63 y.o. female with history of rheumatoid arthritis.  Patient is currently on simponi  aria infusions every 8 weeks and arava  20 mg every other day.  Her next actemra  infusion is on 06/04/24.   TB gold negative on 05/07/24.   CBC and CMP updated on  05/02/24 Lipid panel updated on 03/14/24.   Discussed the importance of holding simponi  aria and arava  if she develops signs or symptoms of an infection and to resume once the infection has completely cleared.     Activities of Daily Living:  Patient reports morning stiffness for *** {minute/hour:19697}.   Patient {ACTIONS;DENIES/REPORTS:21021675::Denies} nocturnal pain.  Difficulty dressing/grooming: {ACTIONS;DENIES/REPORTS:21021675::Denies} Difficulty climbing stairs: {ACTIONS;DENIES/REPORTS:21021675::Denies} Difficulty getting out of chair: {ACTIONS;DENIES/REPORTS:21021675::Denies} Difficulty using hands for taps, buttons, cutlery, and/or writing: {ACTIONS;DENIES/REPORTS:21021675::Denies}  No Rheumatology ROS completed.   PMFS History:  Patient Active Problem List   Diagnosis Date Noted   Osteopenia 04/23/2024   Depression with anxiety 04/23/2024   Acute otitis media 08/11/2023   Mood disorder (HCC)- Emotional Eating 08/02/2022   Class 1 obesity with serious comorbidity and body mass index (BMI) of 30.0 to 30.9 in adult 08/02/2022   At risk for impaired metabolic function 05/30/2022   Perioral dermatitis 05/26/2022   Preventative health care 05/26/2022   Essential hypertension 05/26/2022   Abnormal EKG 05/26/2022   Prediabetes 03/09/2022   Vitamin B12 deficiency 03/09/2022   Primary hypertension 02/22/2022   Hyperlipidemia  02/22/2022   GAD (generalized anxiety disorder) 02/22/2022   Vitamin D  deficiency 02/22/2022   Ear pain, left 08/13/2021   History of anxiety 03/27/2017   History of hypertension 03/27/2017   History of bundle branch block 03/27/2017   Primary osteoarthritis of both hands 12/22/2016   History of shingles 08/02/2016   High risk medication use 08/02/2016   Hypertension 08/01/2016   IBS (irritable bowel syndrome) 08/01/2016   Anxiety 08/01/2016   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   LBBB (left bundle branch block) 01/30/2015   Palpitations 01/30/2015   Pulmonary nodule 07/04/2011   Murmur 02/03/2011   Rheumatoid arthritis (HCC) 02/03/2011   Hair loss 02/03/2011   Fatigue 02/03/2011    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   Hypertension    IBS (irritable bowel syndrome) 08/01/2016   Joint pain    Lactose intolerance    Migraine    Murmur 02/03/2011   Obese    Ovarian failure    Palpitation    Pulmonary nodule, left 2009   last CT Nov 2011-- due 08/2011   Rheumatoid arthritis(714.0)    in remission , off meds as of Summer 2012   Shingles 08/2023   Vitamin D  deficiency     Family History  Problem Relation Age of Onset   Cancer Mother    Arthritis Mother    Uterine cancer Mother    Hypertension Mother    Diabetes Mother    Esophageal cancer Mother  Other Mother        small bowel obstruction   Alcohol abuse Father    Hyperlipidemia Father    Heart disease Father    High blood pressure Father    Sleep apnea Father    Uterine cancer Sister    Heart disease Sister        cardiac stent   Diabetes Sister    Hyperlipidemia Sister    Hypertension Sister    Stroke Sister    Alcohol abuse Brother    Diabetes Brother    Hypertension Brother    Liver cancer Brother    Cancer Brother        lung   Cancer Maternal Aunt        lung----non smoker    Past Surgical History:  Procedure Laterality Date   CHOLECYSTECTOMY  1999   COLONOSCOPY  10/31/11   EXPLORATORY LAPAROTOMY     TONSILLECTOMY  1969   WISDOM TOOTH EXTRACTION     Social History   Social History Narrative   Exercise-- jogs   Immunization History  Administered Date(s) Administered   Influenza Split 07/03/2013   Influenza,inj,Quad PF,6+ Mos 07/01/2023   Influenza,inj,Quad PF,6-35 Mos 07/17/2020   Influenza-Unspecified 07/03/2014, 07/04/2015   PFIZER(Purple Top)SARS-COV-2 Vaccination 01/13/2020, 05/15/2020   Tdap 12/01/2009     Objective: Vital Signs: LMP 10/03/2005    Physical Exam Vitals and nursing note reviewed.  Constitutional:      Appearance: She is well-developed.  HENT:     Head: Normocephalic and atraumatic.  Eyes:     Conjunctiva/sclera: Conjunctivae normal.  Cardiovascular:     Rate and Rhythm: Normal rate and regular rhythm.     Heart sounds: Normal heart sounds.  Pulmonary:     Effort: Pulmonary effort is normal.     Breath sounds: Normal breath sounds.  Abdominal:     General: Bowel sounds are normal.     Palpations: Abdomen is soft.  Musculoskeletal:     Cervical back: Normal range of motion.  Lymphadenopathy:     Cervical: No cervical adenopathy.  Skin:    General: Skin is warm and dry.     Capillary Refill: Capillary refill takes less than 2 seconds.  Neurological:     Mental Status: She is alert and oriented to person, place, and time.  Psychiatric:        Behavior: Behavior normal.      Musculoskeletal Exam: ***  CDAI Exam: CDAI Score: -- Patient Global: --; Provider Global: -- Swollen: --; Tender: -- Joint Exam 05/21/2024   No joint exam has been documented for this visit   There is currently no information documented on the homunculus. Go to the Rheumatology activity and complete the homunculus joint exam.  Investigation: No additional findings.  Imaging: DG Bone Density Result Date: 04/17/2024 EXAM: DUAL  X-RAY ABSORPTIOMETRY (DXA) FOR BONE MINERAL DENSITY 04/17/2024 12:00 pm CLINICAL DATA:  63 year old Female Postmenopausal. Screening for osteoporosis History of fragility fracture. History of vertebral fracture. Patient is or has been on glucocorticoid therapy. Patient is or has been on bone building therapies. TECHNIQUE: An axial (e.g., hips, spine) and/or appendicular (e.g., radius) exam was performed, as appropriate, using GE Secretary/administrator at [employer]. Images are obtained for bone mineral density measurement and are not obtained for diagnostic purposes. [id number] Exclusions: L3-L4. COMPARISON:  None. New baseline. FINDINGS: Scan quality: Good. LUMBAR SPINE (L1-L2): BMD (in g/cm2): 1.035 T-score: -1.1 Z-score: 0.3 LEFT FEMORAL NECK: BMD (in g/cm2): 0.740  T-score: -2.1 Z-score: -0.8 LEFT TOTAL HIP: BMD (in g/cm2): 0.777 T-score: -1.8 Z-score: -0.8 RIGHT FEMORAL NECK: BMD (in g/cm2): 0.849 T-score: -1.4 Z-score: 0.0 RIGHT TOTAL HIP: BMD (in g/cm2): 0.798 T-score: -1.7 Z-score: -0.6 LEFT FOREARM (RADIUS 33%): BMD (in g/cm2): 0.680 T-score: -2.2 Z-score: -1.1 FRAX 10-YEAR PROBABILITY OF FRACTURE: FRAX not reported as the patient is receiving bone building therapy. IMPRESSION: Osteopenia based on BMD. Fracture risk is unknown due to history of bone building therapy. RECOMMENDATIONS: 1. All patients should optimize calcium  and vitamin D  intake. 2. Consider FDA-approved medical therapies in postmenopausal women and men aged 28 years and older, based on the following: - A hip or vertebral (clinical or morphometric) fracture - T-score less than or equal to -2.5 and secondary causes have been excluded. - Low bone mass (T-score between -1.0 and -2.5) and a 10-year probability of a hip fracture greater than or equal to 3% or a 10-year probability of a major osteoporosis-related fracture greater than or equal to 20% based on the [place] -adapted WHO algorithm. - Clinician judgment and/or patient  preferences may indicate treatment for people with 10-year fracture probabilities above or below these levels 3. Patients with diagnosis of osteoporosis or at high risk for fracture should have regular bone mineral density tests. For patients eligible for Medicare, routine testing is allowed once every 2 years. The testing frequency can be increased to one year for patients who have rapidly progressing disease, those who are receiving or discontinuing medical therapy to restore bone mass, or have additional risk factors. Electronically Signed   By: Dina  Arceo M.D.   On: 04/17/2024 14:57    Recent Labs: Lab Results  Component Value Date   WBC 7.3 05/02/2024   HGB 13.1 05/02/2024   PLT 345 05/02/2024   NA 138 05/02/2024   K 4.4 05/02/2024   CL 102 05/02/2024   CO2 28 05/02/2024   GLUCOSE 93 05/02/2024   BUN 12 05/02/2024   CREATININE 0.68 05/02/2024   BILITOT 0.4 05/02/2024   ALKPHOS 59 12/29/2023   AST 11 05/02/2024   ALT 23 05/02/2024   PROT 6.9 05/02/2024   ALBUMIN 3.5 12/29/2023   CALCIUM  9.5 05/02/2024   GFRAA 86 01/07/2021   QFTBGOLDPLUS NEGATIVE 09/12/2018    Speciality Comments: TB Gold: 07/28/2023 Neg  Prior therapy: methotrexate, remicade , orencia, and enbrel (inadequate response), Simponi  stopped Dec 2023, Actemra  started 10/17/22-June 2024; Xeljanz  liq started 03/27/23  Procedures:  No procedures performed Allergies: Other, Contrast media [iodinated contrast media], and Rosuvastatin    Assessment / Plan:     Visit Diagnoses: Rheumatoid arthritis involving multiple sites with positive rheumatoid factor (HCC)  High risk medication use  Long term (current) use of systemic steroids  Chronic pain of both shoulders  Primary osteoarthritis of both hands  Right ankle swelling  Other fatigue  Sicca syndrome (HCC)  History of vitamin D  deficiency  Age-related osteoporosis without current pathological fracture  History of hypertension  History of  anxiety  Pulmonary nodule  History of shingles  History of bundle branch block  History of hyperlipidemia  Orders: No orders of the defined types were placed in this encounter.  No orders of the defined types were placed in this encounter.   Face-to-face time spent with patient was *** minutes. Greater than 50% of time was spent in counseling and coordination of care.  Follow-Up Instructions: No follow-ups on file.   Waddell CHRISTELLA Craze, PA-C  Note - This record has been created using Dragon software.  Chart  creation errors have been sought, but may not always  have been located. Such creation errors do not reflect on  the standard of medical care.

## 2024-05-08 ENCOUNTER — Other Ambulatory Visit: Payer: Self-pay | Admitting: Pharmacist

## 2024-05-08 DIAGNOSIS — M0579 Rheumatoid arthritis with rheumatoid factor of multiple sites without organ or systems involvement: Secondary | ICD-10-CM

## 2024-05-08 DIAGNOSIS — Z79899 Other long term (current) drug therapy: Secondary | ICD-10-CM

## 2024-05-08 NOTE — Progress Notes (Signed)
 Next infusion for Simponi  Aria (G8397)  due on 06/04/2024. She has been cleared to resume infusions Diagnosis: RA  Dose: 2mg /kg at Week 0 (completed on 05/07/2024), Week 4, then every 8 weeks  Last Clinic Visit: 02/19/2024 Next Clinic Visit: 05/21/2024  Last infusion: 05/07/2024  Labs: CBC and CMP on 05/02/2024 TB Gold: negative 07/28/2023   Orders placed for Simponi  Aria (J1602) x 2 doses along with premedication of acetaminophen  650mg  p.o. and diphenhydramine  25mg  p.o. to be administered 30 minutes before medication infusion.  Standing CBC with diff/platelet and CMP with GFR orders placed to be drawn every 2 months.  Next TB gold due October 2025  Sherry Pennant, PharmD, MPH, BCPS, CPP Clinical Pharmacist (Rheumatology and Pulmonology)

## 2024-05-10 ENCOUNTER — Ambulatory Visit: Payer: Self-pay | Admitting: Physician Assistant

## 2024-05-10 LAB — QUANTIFERON-TB GOLD PLUS (RQFGPL)
QuantiFERON Mitogen Value: >10 [IU]/mL
QuantiFERON Nil Value: 0.01 [IU]/mL
QuantiFERON TB1 Ag Value: 0.04 [IU]/mL
QuantiFERON TB2 Ag Value: 0.01 [IU]/mL

## 2024-05-10 LAB — QUANTIFERON-TB GOLD PLUS: QuantiFERON-TB Gold Plus: NEGATIVE

## 2024-05-10 NOTE — Progress Notes (Signed)
 TB gold negative

## 2024-05-16 ENCOUNTER — Other Ambulatory Visit: Payer: Self-pay | Admitting: Family Medicine

## 2024-05-16 ENCOUNTER — Other Ambulatory Visit (HOSPITAL_COMMUNITY): Payer: Self-pay

## 2024-05-16 ENCOUNTER — Encounter: Payer: Self-pay | Admitting: Family Medicine

## 2024-05-16 DIAGNOSIS — F418 Other specified anxiety disorders: Secondary | ICD-10-CM

## 2024-05-16 MED ORDER — ESCITALOPRAM OXALATE 20 MG PO TABS
20.0000 mg | ORAL_TABLET | Freq: Every day | ORAL | 1 refills | Status: DC
  Filled 2024-05-16: qty 90, 90d supply, fill #0
  Filled 2024-08-09: qty 90, 90d supply, fill #1

## 2024-05-21 ENCOUNTER — Telehealth (INDEPENDENT_AMBULATORY_CARE_PROVIDER_SITE_OTHER): Admitting: Family Medicine

## 2024-05-21 ENCOUNTER — Encounter: Payer: Self-pay | Admitting: Family Medicine

## 2024-05-21 ENCOUNTER — Ambulatory Visit: Admitting: Physician Assistant

## 2024-05-21 DIAGNOSIS — M19041 Primary osteoarthritis, right hand: Secondary | ICD-10-CM

## 2024-05-21 DIAGNOSIS — N952 Postmenopausal atrophic vaginitis: Secondary | ICD-10-CM | POA: Diagnosis not present

## 2024-05-21 DIAGNOSIS — Z124 Encounter for screening for malignant neoplasm of cervix: Secondary | ICD-10-CM | POA: Diagnosis not present

## 2024-05-21 DIAGNOSIS — M810 Age-related osteoporosis without current pathological fracture: Secondary | ICD-10-CM

## 2024-05-21 DIAGNOSIS — Z8659 Personal history of other mental and behavioral disorders: Secondary | ICD-10-CM

## 2024-05-21 DIAGNOSIS — Z79899 Other long term (current) drug therapy: Secondary | ICD-10-CM

## 2024-05-21 DIAGNOSIS — G8929 Other chronic pain: Secondary | ICD-10-CM

## 2024-05-21 DIAGNOSIS — R5383 Other fatigue: Secondary | ICD-10-CM

## 2024-05-21 DIAGNOSIS — R911 Solitary pulmonary nodule: Secondary | ICD-10-CM

## 2024-05-21 DIAGNOSIS — Z8679 Personal history of other diseases of the circulatory system: Secondary | ICD-10-CM

## 2024-05-21 DIAGNOSIS — M3500 Sicca syndrome, unspecified: Secondary | ICD-10-CM

## 2024-05-21 DIAGNOSIS — F418 Other specified anxiety disorders: Secondary | ICD-10-CM | POA: Diagnosis not present

## 2024-05-21 DIAGNOSIS — Z8619 Personal history of other infectious and parasitic diseases: Secondary | ICD-10-CM

## 2024-05-21 DIAGNOSIS — M25471 Effusion, right ankle: Secondary | ICD-10-CM

## 2024-05-21 DIAGNOSIS — Z6834 Body mass index (BMI) 34.0-34.9, adult: Secondary | ICD-10-CM | POA: Diagnosis not present

## 2024-05-21 DIAGNOSIS — L680 Hirsutism: Secondary | ICD-10-CM | POA: Diagnosis not present

## 2024-05-21 DIAGNOSIS — Z01419 Encounter for gynecological examination (general) (routine) without abnormal findings: Secondary | ICD-10-CM | POA: Diagnosis not present

## 2024-05-21 DIAGNOSIS — M0579 Rheumatoid arthritis with rheumatoid factor of multiple sites without organ or systems involvement: Secondary | ICD-10-CM

## 2024-05-21 DIAGNOSIS — Z8639 Personal history of other endocrine, nutritional and metabolic disease: Secondary | ICD-10-CM

## 2024-05-21 DIAGNOSIS — Z1151 Encounter for screening for human papillomavirus (HPV): Secondary | ICD-10-CM | POA: Diagnosis not present

## 2024-05-21 DIAGNOSIS — Z7952 Long term (current) use of systemic steroids: Secondary | ICD-10-CM

## 2024-05-21 NOTE — Progress Notes (Signed)
 MyChart Video Visit    Virtual Visit via Video Note   This patient is at least at moderate risk for complications without adequate follow up. This format is felt to be most appropriate for this patient at this time. Physical exam was limited by quality of the video and audio technology used for the visit. Powell was able to get the patient set up on a video visit.  Patient location: home  Patient and provider in visit Provider location: Office  I discussed the limitations of evaluation and management by telemedicine and the availability of in person appointments. The patient expressed understanding and agreed to proceed.  Visit Date: 05/21/2024  Today's healthcare provider: Jamee JONELLE Antonio Cyndee, DO     Subjective:    Patient ID: Sharon Hunter, female    DOB: 1961-02-06, 63 y.o.   MRN: [id number]  Chief Complaint  Patient presents with   Depression   Follow-up    HPI Patient is in today for f/u depression / anxiety.  Discussed the use of AI scribe software for clinical note transcription with the patient, who gave verbal consent to proceed.  History of Present Illness Sharon Hunter is a 63 year old female who presents with symptoms of depression and anxiety.  She has been experiencing symptoms of depression, which is a new development as she has only had anxiety in the past. She describes an improvement in some symptoms such as crying and difficulty sleeping after an increase in her medication dosage to 20 mg, which was prescribed a week ago. However, she continues to struggle with motivation and 'wanting to do things'.  She was previously on a 10 mg dose of her medication for a month before the increase. She notes feeling 'a little better' after the dosage was increased to 20 mg.  She mentions having an appointment at her women's health provider.    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   Hypertension    IBS (irritable bowel syndrome) 08/01/2016   Joint pain    Lactose intolerance    Migraine    Murmur 02/03/2011   Obese    Ovarian failure    Palpitation    Pulmonary nodule, left 2009   last CT Nov 2011-- due 08/2011   Rheumatoid arthritis(714.0)    in remission , off meds as of Summer 2012   Shingles 08/2023   Vitamin D  deficiency     Past Surgical History:  Procedure Laterality Date   CHOLECYSTECTOMY  1999   COLONOSCOPY  10/31/11   EXPLORATORY LAPAROTOMY     TONSILLECTOMY  1969   WISDOM TOOTH EXTRACTION      Family History  Problem Relation Age of Onset   Cancer Mother    Arthritis Mother    Uterine cancer Mother    Hypertension Mother    Diabetes Mother    Esophageal cancer Mother    Other Mother        small bowel obstruction   Alcohol abuse Father    Hyperlipidemia Father    Heart disease Father    High blood pressure Father    Sleep apnea Father    Uterine cancer Sister    Heart disease Sister        cardiac stent   Diabetes Sister    Hyperlipidemia Sister    Hypertension Sister  Stroke Sister    Alcohol abuse Brother    Diabetes Brother    Hypertension Brother    Liver cancer Brother    Cancer Brother        lung   Cancer Maternal Aunt        lung----non smoker    Social History   Socioeconomic History   Marital status: Divorced    Spouse name: Not on file   Number of children: 1   Years of education: 17   Highest education level: Not on file  Occupational History   Occupation: RN  Tobacco Use   Smoking status: Former    Current packs/day: 0.00    Types: Cigarettes    Quit date: 10/03/1990    Years since quitting: 33.6    Passive exposure: Past   Smokeless tobacco: Never  Vaping Use   Vaping status: Never Used  Substance and Sexual Activity   Alcohol use: Yes    Alcohol/week: 3.0 standard drinks of alcohol    Types: 3 Standard drinks or equivalent per week    Comment: Red  wine 4 oz occ   Drug use: No   Sexual activity: Not Currently    Partners: Male  Other Topics Concern   Not on file  Social History Narrative   Exercise-- jogs   Social Drivers of Health   Financial Resource Strain: Not on file  Food Insecurity: Not on file  Transportation Needs: Not on file  Physical Activity: Not on file  Stress: Not on file  Social Connections: Not on file  Intimate Partner Violence: Not on file    Outpatient Medications Prior to Visit  Medication Sig Dispense Refill   amLODipine  (NORVASC ) 5 MG tablet Take 1 tablet (5 mg total) by mouth daily. 90 tablet 3   Calcium -Magnesium (CAL-MAG PO) Take by mouth 2 (two) times daily.     Cholecalciferol  125 MCG (5000 UT) CHEW Chew 1 tablet by mouth daily at 6 (six) AM. (Patient taking differently: Chew 1 tablet by mouth daily at 6 (six) AM. With vitamin K) 30 tablet 0   denosumab  (PROLIA ) 60 MG/ML SOSY injection Inject 60 mg into the skin every 6 (six) months. Dx code: M81.0 1 mL 0   diclofenac  Sodium (VOLTAREN ) 1 % GEL Apply 2-4 grams to affected area up to 4 times daily. 400 g 2   Doxylamine Succinate, Sleep, (UNISOM PO) Take 1 tablet by mouth at bedtime.     escitalopram  (LEXAPRO ) 20 MG tablet Take 1 tablet (20 mg total) by mouth daily. 90 tablet 1   Golimumab  (SIMPONI  ARIA IV) Inject into the vein. 2mg /kg at Weeks 0, 4 then every 8 weeks thereafter     hydrochlorothiazide  (HYDRODIURIL ) 25 MG tablet Take 0.5 tablets (12.5 mg total) by mouth daily. 45 tablet 1   Ibuprofen (MOTRIN PO) Take by mouth as needed.     MELATONIN PO Take 5-10 mg by mouth at bedtime.     Multiple Vitamin (MULTIVITAMIN PO) Take by mouth daily.     valsartan  (DIOVAN ) 80 MG tablet Take 1 tablet (80 mg total) by mouth daily. 90 tablet 3   leflunomide  (ARAVA ) 20 MG tablet Take 1 tablet (20 mg total) by mouth every other day. 45 tablet 0   Facility-Administered Medications Prior to Visit  Medication Dose Route Frequency Provider Last Rate Last Admin    [START ON 08/06/2024] denosumab  (PROLIA ) injection 60 mg  60 mg Subcutaneous Q6 months Antonio Meth, Slate Debroux R, DO  Allergies  Allergen Reactions   Other Swelling    Contrast dye- head swelling per pt.   Contrast Media [Iodinated Contrast Media]    Rosuvastatin  Diarrhea    Review of Systems  Constitutional:  Negative for fever and malaise/fatigue.  HENT:  Negative for congestion.   Eyes:  Negative for blurred vision.  Respiratory:  Negative for cough and shortness of breath.   Cardiovascular:  Negative for chest pain, palpitations and leg swelling.  Gastrointestinal:  Negative for vomiting.  Musculoskeletal:  Negative for back pain.  Skin:  Negative for rash.  Neurological:  Negative for loss of consciousness and headaches.  Psychiatric/Behavioral:  Positive for depression. Negative for hallucinations, memory loss, substance abuse and suicidal ideas. The patient is nervous/anxious. The patient does not have insomnia.        Objective:    Physical Exam Vitals and nursing note reviewed.  Psychiatric:        Attention and Perception: Attention normal.        Mood and Affect: Affect normal. Mood is anxious and depressed.        Thought Content: Thought content is not paranoid or delusional. Thought content does not include homicidal or suicidal ideation. Thought content does not include homicidal or suicidal plan.     LMP 10/03/2005  Wt Readings from Last 3 Encounters:  04/23/24 215 lb 6.4 oz (97.7 kg)  03/14/24 213 lb (96.6 kg)  02/19/24 213 lb 9.6 oz (96.9 kg)       Assessment & Plan:  Depression with anxiety   Assessment and Plan Assessment & Plan Depression symptoms   She reports depressive symptoms, including insomnia. Symptoms improved after increasing medication to 20 mg a week ago, which is the maximum dosage. She has anxiety but no prior depression, indicating new onset depressive symptoms. Continue medication at 20 mg. Monitor symptoms for three weeks.  Consider additional medication if no improvement after three weeks.    I discussed the assessment and treatment plan with the patient. The patient was provided an opportunity to ask questions and all were answered. The patient agreed with the plan and demonstrated an understanding of the instructions.   The patient was advised to call back or seek an in-person evaluation if the symptoms worsen or if the condition fails to improve as anticipated.  Jamee JONELLE Antonio Cyndee, DO [facility] [phone] (phone) [phone] (fax)  [facility]

## 2024-05-23 ENCOUNTER — Other Ambulatory Visit: Payer: Self-pay

## 2024-05-23 ENCOUNTER — Other Ambulatory Visit (HOSPITAL_BASED_OUTPATIENT_CLINIC_OR_DEPARTMENT_OTHER): Payer: Self-pay | Admitting: Family

## 2024-05-23 ENCOUNTER — Other Ambulatory Visit (HOSPITAL_BASED_OUTPATIENT_CLINIC_OR_DEPARTMENT_OTHER): Payer: Self-pay

## 2024-05-23 DIAGNOSIS — Z1231 Encounter for screening mammogram for malignant neoplasm of breast: Secondary | ICD-10-CM

## 2024-05-23 MED ORDER — PREDNISONE 5 MG PO TABS
ORAL_TABLET | ORAL | 0 refills | Status: AC
  Filled 2024-05-23: qty 40, 16d supply, fill #0

## 2024-05-23 NOTE — Telephone Encounter (Signed)
 Patient contacted the office and states she was going to call the office today about getting an injection for her left shoulder, but now she is having problems with her hands. Patient states her hands are painful, swollen, and she cannot open them. Patient states her left shoulder is still bothering her and it wakes her up at night. Patient states she just started getting back on her medications and has only had one infusion. Patient states she is using Voltaren  and Motrin and nothing is helping. Patient inquired if maybe she needed to go on steroids. Please advise.

## 2024-05-23 NOTE — Telephone Encounter (Signed)
 Patient advised Ok to send in a prednisone  taper starting at 20 mg tapering by 5 mg every 4 days. Avoid the use of NSAIDs while taking prednisone . Take prednisone  in the morning with food. Patient verbalized understanding. Patient states she would like the Prednisone  sent to [facility]. Please review and sign.

## 2024-05-23 NOTE — Addendum Note (Signed)
 Addended by: Jaryah Aracena P on: 05/23/2024 11:30 AM   Modules accepted: Orders

## 2024-05-23 NOTE — Telephone Encounter (Signed)
 Ok to send in a prednisone  taper starting at 20 mg tapering by 5 mg every 4 days.  Avoid the use of NSAIDs while taking prednisone .  Take prednisone  in the morning with food.

## 2024-05-27 ENCOUNTER — Encounter (HOSPITAL_BASED_OUTPATIENT_CLINIC_OR_DEPARTMENT_OTHER): Payer: Self-pay

## 2024-05-27 ENCOUNTER — Ambulatory Visit (HOSPITAL_BASED_OUTPATIENT_CLINIC_OR_DEPARTMENT_OTHER)
Admission: RE | Admit: 2024-05-27 | Discharge: 2024-05-27 | Disposition: A | Discharge status: Home / Self Care | Source: Ambulatory Visit | Attending: Family | Admitting: Family

## 2024-05-27 DIAGNOSIS — Z1231 Encounter for screening mammogram for malignant neoplasm of breast: Secondary | ICD-10-CM | POA: Diagnosis not present

## 2024-05-27 NOTE — Progress Notes (Unsigned)
 Office Visit Note  Patient: Sharon Hunter             Date of Birth: 1961/08/13           MRN: [id number]             PCP: Antonio Cyndee Jamee JONELLE, DO Referring: Antonio Cyndee Jamee JONELLE, * Visit Date: 05/30/2024 Occupation: @GUAROCC @  Subjective:  Pain in multiple joints  History of Present Illness: Sharon Hunter is a 63 y.o. female with history of rheumatoid arthritis.  Patient is currently prescribed Simponi  Aria infusions--most recent dose was administered on 05/10/24 and the next dose is scheduled on 06/04/24.  She will then start spacing simponi  aria to every 8 weeks.  Patient continues to have chronic pain involving multiple joints.  She has had severe discomfort involving both shoulders as well as increased discomfort in her right hip and left ankle joint.  She had a recent fall due to the severity of symptoms.  She uses a cane as needed.  She has difficulty walking any length of time due to the discomfort in the right hip.  She denies any discomfort in the right when getting in and out of the car, at night, or when climbing steps.  She has been wearing bilateral CMC joint braces which have been helpful at providing support.  She is currently taking a prednisone  taper which was sent to the pharmacy on 05/23/2024.  This morning she took 15 mg of prednisone .  The prednisone  has been helping to subside her pain and inflammation but has not resolved it.     Activities of Daily Living:  Patient reports morning stiffness for 6 hours.   Patient Reports nocturnal pain.  Difficulty dressing/grooming: Reports Difficulty climbing stairs: Reports Difficulty getting out of chair: Reports Difficulty using hands for taps, buttons, cutlery, and/or writing: Reports  Review of Systems  Constitutional:  Positive for fatigue.  HENT:  Negative for mouth sores and mouth dryness.   Eyes:  Positive for dryness.  Respiratory:  Negative for shortness of breath.   Cardiovascular:  Negative for chest pain and  palpitations.  Gastrointestinal:  Negative for blood in stool, constipation and diarrhea.  Endocrine: Negative for increased urination.  Genitourinary:  Negative for involuntary urination.  Musculoskeletal:  Positive for joint pain, joint pain, joint swelling, myalgias, morning stiffness, muscle tenderness and myalgias. Negative for gait problem and muscle weakness.  Skin:  Negative for color change, rash, hair loss and sensitivity to sunlight.  Allergic/Immunologic: Positive for susceptible to infections.  Neurological:  Negative for dizziness and headaches.  Hematological:  Negative for swollen glands.  Psychiatric/Behavioral:  Positive for depressed mood and sleep disturbance. The patient is nervous/anxious.     PMFS History:  Patient Active Problem List   Diagnosis Date Noted   Osteopenia 04/23/2024   Depression with anxiety 04/23/2024   Acute otitis media 08/11/2023   Mood disorder (HCC)- Emotional Eating 08/02/2022   Class 1 obesity with serious comorbidity and body mass index (BMI) of 30.0 to 30.9 in adult 08/02/2022   At risk for impaired metabolic function 05/30/2022   Perioral dermatitis 05/26/2022   Preventative health care 05/26/2022   Essential hypertension 05/26/2022   Abnormal EKG 05/26/2022   Prediabetes 03/09/2022   Vitamin B12 deficiency 03/09/2022   Primary hypertension 02/22/2022   Hyperlipidemia 02/22/2022   GAD (generalized anxiety disorder) 02/22/2022   Vitamin D  deficiency 02/22/2022   Ear pain, left 08/13/2021   History of anxiety 03/27/2017  History of hypertension 03/27/2017   History of bundle branch block 03/27/2017   Primary osteoarthritis of both hands 12/22/2016   History of shingles 08/02/2016   High risk medication use 08/02/2016   Hypertension 08/01/2016   IBS (irritable bowel syndrome) 08/01/2016   Anxiety 08/01/2016   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   LBBB (left bundle branch block) 01/30/2015   Palpitations 01/30/2015    Pulmonary nodule 07/04/2011   Murmur 02/03/2011   Rheumatoid arthritis (HCC) 02/03/2011   Hair loss 02/03/2011   Fatigue 02/03/2011    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   Hypertension    IBS (irritable bowel syndrome) 08/01/2016   Joint pain    Lactose intolerance    Migraine    Murmur 02/03/2011   Obese    Ovarian failure    Palpitation    Pulmonary nodule, left 2009   last CT Nov 2011-- due 08/2011   Rheumatoid arthritis(714.0)    in remission , off meds as of Summer 2012   Shingles 08/2023   Vitamin D  deficiency     Family History  Problem Relation Age of Onset   Cancer Mother    Arthritis Mother    Uterine cancer Mother    Hypertension Mother    Diabetes Mother    Esophageal cancer Mother    Other Mother        small bowel obstruction   Alcohol abuse Father    Hyperlipidemia Father    Heart disease Father    High blood pressure Father    Sleep apnea Father    Uterine cancer Sister    Heart disease Sister        cardiac stent   Diabetes Sister    Hyperlipidemia Sister    Hypertension Sister    Stroke Sister    Alcohol abuse Brother    Diabetes Brother    Hypertension Brother    Liver cancer Brother    Cancer Brother        lung   Cancer Maternal Aunt        lung----non smoker   Past Surgical History:  Procedure Laterality Date   CHOLECYSTECTOMY  1999   COLONOSCOPY  10/31/11   EXPLORATORY LAPAROTOMY     TONSILLECTOMY  1969   WISDOM TOOTH EXTRACTION     Social History   Social History Narrative   Exercise-- jogs   Immunization History  Administered Date(s) Administered   Influenza Split 07/03/2013   Influenza,inj,Quad PF,6+ Mos 07/01/2023   Influenza,inj,Quad PF,6-35 Mos 07/17/2020   Influenza-Unspecified 07/03/2014, 07/04/2015   PFIZER(Purple Top)SARS-COV-2 Vaccination 01/13/2020, 05/15/2020   Tdap 12/01/2009      Objective: Vital Signs: BP 124/81 (BP Location: Left Arm, Patient Position: Sitting, Cuff Size: Normal)   Pulse 60   Resp 12   Ht 5' 6.5 (1.689 m)   Wt 211 lb (95.7 kg)   LMP 10/03/2005   BMI 33.55 kg/m    Physical Exam Vitals and nursing note reviewed.  Constitutional:      Appearance: She is well-developed.  HENT:     Head: Normocephalic and atraumatic.  Eyes:     Conjunctiva/sclera: Conjunctivae normal.  Cardiovascular:     Rate and Rhythm: Normal rate and regular rhythm.     Heart sounds: Normal heart sounds.  Pulmonary:     Effort: Pulmonary effort is normal.  Breath sounds: Normal breath sounds.  Abdominal:     General: Bowel sounds are normal.     Palpations: Abdomen is soft.  Musculoskeletal:     Cervical back: Normal range of motion.  Lymphadenopathy:     Cervical: No cervical adenopathy.  Skin:    General: Skin is warm and dry.     Capillary Refill: Capillary refill takes less than 2 seconds.  Neurological:     Mental Status: She is alert and oriented to person, place, and time.  Psychiatric:        Behavior: Behavior normal.      Musculoskeletal Exam: Discomfort with ROM of the C-spine. Painful ROM of both shoulders. Crepitus with ROM of the left shoulder noted.  Elbow joints have good range of motion with no tenderness or swelling along the joint line.  No tenderness or swelling of the wrist joints.  CMC joint prominence and thickening bilaterally.  Synovial thickening of the 1st through 3rd MCP joints noted.  Hip joints have good range of motion with no groin pain on exam.  Knee joints have good range of motion no warmth or effusion.  Synovial thickening and tenderness of the left ankle.  CDAI Exam: CDAI Score: -- Patient Global: --; Provider Global: -- Swollen: --; Tender: -- Joint Exam 05/30/2024   No joint exam has been documented for this visit   There is currently no information documented on the homunculus. Go to the Rheumatology activity  and complete the homunculus joint exam.  Investigation: No additional findings.  Imaging: MM 3D SCREENING MAMMOGRAM BILATERAL BREAST Result Date: 05/29/2024 CLINICAL DATA:  Screening. EXAM: DIGITAL SCREENING BILATERAL MAMMOGRAM WITH TOMOSYNTHESIS AND CAD TECHNIQUE: Bilateral screening digital craniocaudal and mediolateral oblique mammograms were obtained. Bilateral screening digital breast tomosynthesis was performed. The images were evaluated with computer-aided detection. COMPARISON:  Previous exam(s). ACR Breast Density Category b: There are scattered areas of fibroglandular density. FINDINGS: There are no findings suspicious for malignancy. IMPRESSION: No mammographic evidence of malignancy. A result letter of this screening mammogram will be mailed directly to the patient. RECOMMENDATION: Screening mammogram in one year. (Code:SM-B-01Y) BI-RADS CATEGORY  1: Negative. Electronically Signed   By: Reyes Phi M.D.   On: 05/29/2024 15:35     Recent Labs: Lab Results  Component Value Date   WBC 7.3 05/02/2024   HGB 13.1 05/02/2024   PLT 345 05/02/2024   NA 138 05/02/2024   K 4.4 05/02/2024   CL 102 05/02/2024   CO2 28 05/02/2024   GLUCOSE 93 05/02/2024   BUN 12 05/02/2024   CREATININE 0.68 05/02/2024   BILITOT 0.4 05/02/2024   ALKPHOS 59 12/29/2023   AST 11 05/02/2024   ALT 23 05/02/2024   PROT 6.9 05/02/2024   ALBUMIN 3.5 12/29/2023   CALCIUM  9.5 05/02/2024   GFRAA 86 01/07/2021   QFTBGOLDPLUS Negative 05/07/2024    Speciality Comments: TB Gold: 07/28/2023 Neg  Prior therapy: methotrexate, remicade , orencia, and enbrel (inadequate response), Simponi  stopped Dec 2023, Actemra  started 10/17/22-June 2024; Xeljanz  liq started 03/27/23  Procedures:  No procedures performed Allergies: Other, Contrast media [iodinated contrast media], and Rosuvastatin    Assessment / Plan:     Visit Diagnoses: Rheumatoid arthritis involving multiple sites with positive rheumatoid factor (HCC) - D/d  xeljanz  due to shingles outbreak, inadequate response.Prev therapy:methotrexate,Remicade ,Orencia,Enbrel,Simponi ,Actemra ,Xeljanz : Patient presents today with significant pain involving multiple joints.  Her symptoms have been most severe involving both shoulders, both hands, right hip, and left ankle.  She had a recent fall due  to severity of symptoms in her right hip and left ankle.  She has had to use a cane at times.  According to the patient she is qualified for disability.  Her quality of life has been suffering due to the severity of symptoms she has been experiencing secondary to rheumatoid arthritis.  She is unable to walk for any length of time due to the severity of pain in her right hip.  Offered to obtain x-rays of the right hip today for further evaluation but she would like to hold off to see if the right hip pain improves after her next office in the area on 06/04/2024.  On examination she has good range of motion of the right hip joint with no discomfort.  According to the patient the pain is only reproducible while walking. She is currently taking a prednisone  taper which was sent to the pharmacy on 05/23/2024.  She took 15 mg of prednisone  this morning.  Prednisone  has helped to alleviate her symptoms but has not resolved the discomfort.  Patient would like to complete the loading dose of Simponi  Aria prior to making any medication changes.  She is apprehensive of combination therapy due to the risk for immunosuppression.  Patient would like to to further discuss possibly switching back to IV Remicade  in the future if Simponi  Aria does not provide an adequate response.  She will follow up in 3 months or sooner if needed.   High risk medication use - Due to last loading dose on 06/04/24--then will continue Simponi  Aria IV infusions every 8 weeks as a maintenance dose. TB gold negative on 05/07/24.   CBC and CMP updated on  05/02/24. She will continue to have updated lab work with infusions.  Lipid  panel updated on 03/14/24.   No recent or recurrent infections. Discussed the importance of holding simponi  aria and arava  if she develops signs or symptoms of an infection and to resume once the infection has completely cleared.    Long term (current) use of systemic steroids - Previously on prednisone  5 mg daily. She is currently taking a prednisone  taper sent in on 05/23/24--on 15 mg daily currently.    Chronic pain of both shoulders: Chronic pain and stiffness.  Discomfort with ROM and nocturnal pain. Discomfort and crepitus noted with ROM.   Primary osteoarthritis of both hands: Chronic pain.  Thickening of bilateral 1st-3rd MCP joints.  Tenderness of both CMC joints.  She is wearing bilateral CMC joint braces.   Other fatigue: Chronic-persistent-related to difficulty sleeping at night due to nocturnal pain.   Sicca syndrome (HCC): Chronic dry eyes   History of vitamin D  deficiency: She is taking a daily vitamin D  supplement.   Age-related osteoporosis without current pathological fracture - DEXA 04/17/2024:completed the one year course of evenity  (completed 01/11/24), transitioned to prolia  injections every 6 months.  She received prolia  on 02/05/24.  Other medical conditions are listed as follows:   History of hypertension: BP was 124/81 today in the office.   Pulmonary nodule  History of anxiety  History of shingles  History of bundle branch block  History of hyperlipidemia  Orders: No orders of the defined types were placed in this encounter.  No orders of the defined types were placed in this encounter.    Follow-Up Instructions: Return in about 3 months (around 08/30/2024) for Rheumatoid arthritis.   Waddell CHRISTELLA Craze, PA-C  Note - This record has been created using Dragon software.  Chart creation errors have been  sought, but may not always  have been located. Such creation errors do not reflect on  the standard of medical care.

## 2024-05-30 ENCOUNTER — Ambulatory Visit: Attending: Physician Assistant | Admitting: Physician Assistant

## 2024-05-30 ENCOUNTER — Encounter: Payer: Self-pay | Admitting: Physician Assistant

## 2024-05-30 VITALS — BP 124/81 | HR 60 | Resp 12 | Ht 66.5 in | Wt 211.0 lb

## 2024-05-30 DIAGNOSIS — Z8679 Personal history of other diseases of the circulatory system: Secondary | ICD-10-CM

## 2024-05-30 DIAGNOSIS — G8929 Other chronic pain: Secondary | ICD-10-CM

## 2024-05-30 DIAGNOSIS — Z79899 Other long term (current) drug therapy: Secondary | ICD-10-CM | POA: Diagnosis not present

## 2024-05-30 DIAGNOSIS — Z7952 Long term (current) use of systemic steroids: Secondary | ICD-10-CM | POA: Diagnosis not present

## 2024-05-30 DIAGNOSIS — M25512 Pain in left shoulder: Secondary | ICD-10-CM

## 2024-05-30 DIAGNOSIS — Z8639 Personal history of other endocrine, nutritional and metabolic disease: Secondary | ICD-10-CM

## 2024-05-30 DIAGNOSIS — Z8659 Personal history of other mental and behavioral disorders: Secondary | ICD-10-CM

## 2024-05-30 DIAGNOSIS — R5383 Other fatigue: Secondary | ICD-10-CM | POA: Diagnosis not present

## 2024-05-30 DIAGNOSIS — M0579 Rheumatoid arthritis with rheumatoid factor of multiple sites without organ or systems involvement: Secondary | ICD-10-CM

## 2024-05-30 DIAGNOSIS — M25471 Effusion, right ankle: Secondary | ICD-10-CM | POA: Diagnosis not present

## 2024-05-30 DIAGNOSIS — M810 Age-related osteoporosis without current pathological fracture: Secondary | ICD-10-CM

## 2024-05-30 DIAGNOSIS — Z8619 Personal history of other infectious and parasitic diseases: Secondary | ICD-10-CM

## 2024-05-30 DIAGNOSIS — M19042 Primary osteoarthritis, left hand: Secondary | ICD-10-CM

## 2024-05-30 DIAGNOSIS — M3500 Sicca syndrome, unspecified: Secondary | ICD-10-CM

## 2024-05-30 DIAGNOSIS — M25511 Pain in right shoulder: Secondary | ICD-10-CM | POA: Diagnosis not present

## 2024-05-30 DIAGNOSIS — M19041 Primary osteoarthritis, right hand: Secondary | ICD-10-CM

## 2024-05-30 DIAGNOSIS — R911 Solitary pulmonary nodule: Secondary | ICD-10-CM

## 2024-06-04 ENCOUNTER — Ambulatory Visit (HOSPITAL_COMMUNITY)
Admission: RE | Admit: 2024-06-04 | Discharge: 2024-06-04 | Disposition: A | Discharge status: Home / Self Care | Source: Ambulatory Visit | Attending: Rheumatology | Admitting: Rheumatology

## 2024-06-04 DIAGNOSIS — Z79899 Other long term (current) drug therapy: Secondary | ICD-10-CM | POA: Diagnosis present

## 2024-06-04 DIAGNOSIS — M0570 Rheumatoid arthritis with rheumatoid factor of unspecified site without organ or systems involvement: Secondary | ICD-10-CM | POA: Insufficient documentation

## 2024-06-04 DIAGNOSIS — M0579 Rheumatoid arthritis with rheumatoid factor of multiple sites without organ or systems involvement: Secondary | ICD-10-CM | POA: Diagnosis present

## 2024-06-04 MED ORDER — DIPHENHYDRAMINE HCL 25 MG PO CAPS: 25.0000 mg | ORAL_CAPSULE | ORAL | Status: DC

## 2024-06-04 MED ORDER — ACETAMINOPHEN 325 MG PO TABS: 650.0000 mg | ORAL_TABLET | ORAL | Status: DC

## 2024-06-04 MED ORDER — SODIUM CHLORIDE 0.9 % IV SOLN
2.0000 mg/kg | INTRAVENOUS | Status: DC
  Administered 2024-06-04: 195 mg via INTRAVENOUS
  Filled 2024-06-04: qty 15.6

## 2024-06-10 ENCOUNTER — Encounter: Payer: Self-pay | Admitting: Family Medicine

## 2024-06-10 ENCOUNTER — Telehealth: Payer: Self-pay | Admitting: Internal Medicine

## 2024-06-10 ENCOUNTER — Ambulatory Visit: Payer: Self-pay

## 2024-06-10 ENCOUNTER — Ambulatory Visit: Admitting: Family Medicine

## 2024-06-10 VITALS — BP 120/80 | HR 71 | Temp 98.4°F | Resp 18 | Ht 65.5 in | Wt 218.8 lb

## 2024-06-10 DIAGNOSIS — R1011 Right upper quadrant pain: Secondary | ICD-10-CM

## 2024-06-10 LAB — POC URINALSYSI DIPSTICK (AUTOMATED)
Bilirubin, UA: NEGATIVE
Blood, UA: NEGATIVE
Glucose, UA: NEGATIVE
Leukocytes, UA: NEGATIVE
Nitrite, UA: NEGATIVE
Protein, UA: NEGATIVE
Spec Grav, UA: 1.01 (ref 1.010–1.025)
Urobilinogen, UA: 0.2 U/dL
pH, UA: 6.5 (ref 5.0–8.0)

## 2024-06-10 NOTE — Progress Notes (Signed)
 Subjective:    Patient ID: Sharon Hunter, female    DOB: [birth year], 63 y.o.   MRN: [id number]  Chief Complaint  Patient presents with   Abdominal Pain    Sxs started Friday evening, upper right quad into the flank n back. No N/V or constipation or diarrhea     HPI Patient is in today for abd pain.  Discussed the use of AI scribe software for clinical note transcription with the patient, who gave verbal consent to proceed.  History of Present Illness Sharon Hunter is a 64 year old female with rheumatoid arthritis who presents with abdominal pain and bloating.  Her symptoms began on Friday evening after sitting with coworkers for several hours. Initially, she experienced fullness in the right upper quadrant, which progressed to bloating across her abdomen and back by Saturday. No nausea or fever was present at that time.  On Sunday morning, she awoke with excruciating pain and difficulty breathing due to the fullness and tightness in her abdomen. The pain subsided after standing for about 20 minutes. She avoided lying down and instead slept sitting upright in a chair. By Sunday evening, the fullness persisted but was less severe.  She reports normal bowel movements and urine output, with no changes in urine color. She has experienced intermittent diarrhea, which she attributes to her rheumatoid arthritis medications.  She stopped taking Lexapro  due to concerns about liver metabolism but continued taking HCTZ and Diovan . She recently underwent a double infusion of Simponi  Aria and has had liver function spikes in the past. She is uncertain if her symptoms are related to medication or another cause.  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    Past Medical History:  Diagnosis Date   Anxiety    Breast lump    stable-- gets annual mammogram   History of  primary ovarian failure 08/01/2016   Hypertension    IBS (irritable bowel syndrome) 08/01/2016   Joint pain    Lactose intolerance    Migraine    Murmur 02/03/2011   Obese    Ovarian failure    Palpitation    Pulmonary nodule, left 2009   last CT Nov 2011-- due 08/2011   Rheumatoid arthritis(714.0)    in remission , off meds as of Summer 2012   Shingles 08/2023   Vitamin D  deficiency     Past Surgical History:  Procedure Laterality Date   CHOLECYSTECTOMY  1999   COLONOSCOPY  10/31/11   EXPLORATORY LAPAROTOMY     TONSILLECTOMY  1969   WISDOM TOOTH EXTRACTION      Family History  Problem Relation Age of Onset   Cancer Mother    Arthritis Mother    Uterine cancer Mother    Hypertension Mother    Diabetes Mother    Esophageal cancer Mother    Other Mother        small bowel obstruction   Alcohol abuse Father    Hyperlipidemia Father    Heart disease Father    High blood pressure Father    Sleep apnea Father    Uterine cancer Sister    Heart disease Sister        cardiac stent   Diabetes Sister    Hyperlipidemia Sister    Hypertension Sister    Stroke Sister    Alcohol abuse Brother    Diabetes Brother  Hypertension Brother    Liver cancer Brother    Cancer Brother        lung   Cancer Maternal Aunt        lung----non smoker    Social History   Socioeconomic History   Marital status: Divorced    Spouse name: Not on file   Number of children: 1   Years of education: 17   Highest education level: Not on file  Occupational History   Occupation: RN  Tobacco Use   Smoking status: Former    Current packs/day: 0.00    Types: Cigarettes    Quit date: 10/03/1990    Years since quitting: 33.7    Passive exposure: Past   Smokeless tobacco: Never  Vaping Use   Vaping status: Never Used  Substance and Sexual Activity   Alcohol use: Yes    Alcohol/week: 3.0 standard drinks of alcohol    Types: 3 Standard drinks or equivalent per week    Comment: Red wine  4 oz occ   Drug use: No   Sexual activity: Not Currently    Partners: Male  Other Topics Concern   Not on file  Social History Narrative   Exercise-- jogs   Social Drivers of Health   Financial Resource Strain: Not on file  Food Insecurity: Not on file  Transportation Needs: Not on file  Physical Activity: Not on file  Stress: Not on file  Social Connections: Not on file  Intimate Partner Violence: Not on file    Outpatient Medications Prior to Visit  Medication Sig Dispense Refill   amLODipine  (NORVASC ) 5 MG tablet Take 1 tablet (5 mg total) by mouth daily. 90 tablet 3   Calcium -Magnesium (CAL-MAG PO) Take by mouth 2 (two) times daily.     Cholecalciferol  125 MCG (5000 UT) CHEW Chew 1 tablet by mouth daily at 6 (six) AM. (Patient taking differently: Chew 1 tablet by mouth daily at 6 (six) AM. With vitamin K) 30 tablet 0   denosumab  (PROLIA ) 60 MG/ML SOSY injection Inject 60 mg into the skin every 6 (six) months. Dx code: M81.0 1 mL 0   diclofenac  Sodium (VOLTAREN ) 1 % GEL Apply 2-4 grams to affected area up to 4 times daily. 400 g 2   Doxylamine Succinate, Sleep, (UNISOM PO) Take 1 tablet by mouth at bedtime.     escitalopram  (LEXAPRO ) 20 MG tablet Take 1 tablet (20 mg total) by mouth daily. 90 tablet 1   Golimumab  (SIMPONI  ARIA IV) Inject into the vein. 2mg /kg at Weeks 0, 4 then every 8 weeks thereafter     hydrochlorothiazide  (HYDRODIURIL ) 25 MG tablet Take 0.5 tablets (12.5 mg total) by mouth daily. 45 tablet 1   Ibuprofen (MOTRIN PO) Take by mouth as needed.     losartan  (COZAAR ) 100 MG tablet 1 tablet Orally Once a day; Duration: 30 day(s)     MELATONIN PO Take 5-10 mg by mouth at bedtime.     Multiple Vitamin (MULTIVITAMIN PO) Take by mouth daily.     valsartan  (DIOVAN ) 80 MG tablet Take 1 tablet (80 mg total) by mouth daily. 90 tablet 3   Facility-Administered Medications Prior to Visit  Medication Dose Route Frequency Provider Last Rate Last Admin   [START ON  08/06/2024] denosumab  (PROLIA ) injection 60 mg  60 mg Subcutaneous Q6 months Antonio Meth, Mirna Sutcliffe R, DO        Allergies  Allergen Reactions   Other Swelling    Contrast dye- head swelling per  pt.   Contrast Media [Iodinated Contrast Media]    Rosuvastatin  Diarrhea    Review of Systems  Constitutional:  Negative for chills, fever and malaise/fatigue.  HENT:  Negative for congestion and hearing loss.   Eyes:  Negative for blurred vision and discharge.  Respiratory:  Negative for cough, sputum production and shortness of breath.   Cardiovascular:  Negative for chest pain, palpitations and leg swelling.  Gastrointestinal:  Positive for abdominal pain. Negative for blood in stool, constipation, diarrhea, heartburn, nausea and vomiting.  Genitourinary:  Negative for dysuria, frequency, hematuria and urgency.  Musculoskeletal:  Negative for back pain, falls and myalgias.  Skin:  Negative for rash.  Neurological:  Negative for dizziness, sensory change, loss of consciousness, weakness and headaches.  Endo/Heme/Allergies:  Negative for environmental allergies. Does not bruise/bleed easily.  Psychiatric/Behavioral:  Negative for depression and suicidal ideas. The patient is not nervous/anxious and does not have insomnia.        Objective:    Physical Exam Vitals and nursing note reviewed.  Constitutional:      General: She is not in acute distress.    Appearance: Normal appearance. She is well-developed.  HENT:     Head: Normocephalic and atraumatic.  Eyes:     General: No scleral icterus.       Right eye: No discharge.        Left eye: No discharge.  Cardiovascular:     Rate and Rhythm: Normal rate and regular rhythm.     Heart sounds: No murmur heard. Pulmonary:     Effort: Pulmonary effort is normal. No respiratory distress.     Breath sounds: Normal breath sounds.  Abdominal:     General: Bowel sounds are normal.     Palpations: Abdomen is soft.     Tenderness: There is no  abdominal tenderness.     Hernia: No hernia is present.  Musculoskeletal:        General: Normal range of motion.     Cervical back: Normal range of motion and neck supple.     Right lower leg: No edema.     Left lower leg: No edema.  Skin:    General: Skin is warm and dry.  Neurological:     Mental Status: She is alert and oriented to person, place, and time.  Psychiatric:        Mood and Affect: Mood normal.        Behavior: Behavior normal.        Thought Content: Thought content normal.        Judgment: Judgment normal.     BP 120/80 (BP Location: Right Arm, Patient Position: Sitting, Cuff Size: Normal)   Pulse 71   Temp 98.4 F (36.9 C) (Oral)   Resp 18   Ht 5' 5.5 (1.664 m)   Wt 218 lb 12.8 oz (99.2 kg)   LMP 10/03/2005   SpO2 97%   BMI 35.86 kg/m  Wt Readings from Last 3 Encounters:  06/10/24 218 lb 12.8 oz (99.2 kg)  05/30/24 211 lb (95.7 kg)  04/23/24 215 lb 6.4 oz (97.7 kg)    Diabetic Foot Exam - Simple   No data filed    Lab Results  Component Value Date   WBC 7.3 05/02/2024   HGB 13.1 05/02/2024   HCT 40.6 05/02/2024   PLT 345 05/02/2024   GLUCOSE 93 05/02/2024   CHOL 253 (H) 03/14/2024   TRIG 281 (H) 03/14/2024   HDL 93 03/14/2024  LDLDIRECT 132.6 05/23/2013   LDLCALC 117 (H) 03/14/2024   ALT 23 05/02/2024   AST 11 05/02/2024   NA 138 05/02/2024   K 4.4 05/02/2024   CL 102 05/02/2024   CREATININE 0.68 05/02/2024   BUN 12 05/02/2024   CO2 28 05/02/2024   TSH 0.753 02/22/2022   HGBA1C 5.9 (H) 05/02/2024    Lab Results  Component Value Date   TSH 0.753 02/22/2022   Lab Results  Component Value Date   WBC 7.3 05/02/2024   HGB 13.1 05/02/2024   HCT 40.6 05/02/2024   MCV 92.3 05/02/2024   PLT 345 05/02/2024   Lab Results  Component Value Date   NA 138 05/02/2024   K 4.4 05/02/2024   CO2 28 05/02/2024   GLUCOSE 93 05/02/2024   BUN 12 05/02/2024   CREATININE 0.68 05/02/2024   BILITOT 0.4 05/02/2024   ALKPHOS 59 12/29/2023    AST 11 05/02/2024   ALT 23 05/02/2024   PROT 6.9 05/02/2024   ALBUMIN 3.5 12/29/2023   CALCIUM  9.5 05/02/2024   ANIONGAP 15 12/29/2023   EGFR 98 05/02/2024   GFR 84.35 01/26/2015   Lab Results  Component Value Date   CHOL 253 (H) 03/14/2024   Lab Results  Component Value Date   HDL 93 03/14/2024   Lab Results  Component Value Date   LDLCALC 117 (H) 03/14/2024   Lab Results  Component Value Date   TRIG 281 (H) 03/14/2024   Lab Results  Component Value Date   CHOLHDL 2.7 03/14/2024   Lab Results  Component Value Date   HGBA1C 5.9 (H) 05/02/2024       Assessment & Plan:  Right upper quadrant abdominal pain -     Comprehensive metabolic panel with GFR -     CBC with Differential/Platelet -     Amylase -     Lipase -     POCT Urinalysis Dipstick (Automated)  Assessment and Plan Assessment & Plan Right upper quadrant abdominal pain   She experienced intermittent right upper quadrant abdominal pain since Friday night, with a severe exacerbation on Sunday morning. Symptoms included bloating and dyspnea, but no nausea, fever, or changes in bowel or urinary habits. Pain has significantly improved by today. Differential diagnosis includes medication-induced liver dysfunction, biliary tract issues, or other abdominal pathology. Recent double infusion of Simponi  Aria and Lexapro  use noted. Absence of gallbladder reduces likelihood of gallbladder-related issues. Order blood work to assess liver and pancreatic function, including a complete metabolic panel. Check urine for hematuria and other abnormalities. If blood work shows elevated liver enzymes, proceed with CT scan of the abdomen. Advise her to report if pain returns.    Ramaya Guile R Lowne Chase, DO

## 2024-06-10 NOTE — Telephone Encounter (Signed)
 I agree with seeking urgent evaluation by PCP today.  Dr. Antonio will be able to determine imaging/referrals needed.

## 2024-06-10 NOTE — Telephone Encounter (Signed)
 FYI Only or Action Required?: FYI only for provider.  Patient was last seen in primary care on 05/21/2024 by Antonio Meth, Jamee SAUNDERS, DO.  Called Nurse Triage reporting Abdominal Pain.  Symptoms began several days ago.  Interventions attempted: Nothing.  Symptoms are: unchanged.  Triage Disposition: See Physician Within 24 Hours  Patient/caregiver understands and will follow disposition?: Yes   Hx RA and had infusion on Tuesday last week.     Copied from CRM [id number]. Topic: Clinical - Red Word Triage >> Jun 10, 2024  7:34 AM Robinson H wrote: [facility] that prompted transfer to Nurse Triage: Upper abdominal pain Reason for Disposition  [1] MILD pain (e.g., does not interfere with normal activities) AND [2] pain comes and goes (cramps) AND [3] present > 48 hours  (Exception: This same abdominal pain is a chronic symptom recurrent or ongoing AND present > 4 weeks.)  Answer Assessment - Initial Assessment Questions 1. LOCATION: Where does it hurt?      Liver area, states had an infusion on Tuesday, for RA, URQ 2. RADIATION: Does the pain shoot anywhere else? (e.g., chest, back)     To right side 3. ONSET: When did the pain begin? (e.g., minutes, hours or days ago)      Friday evening 4. SUDDEN: Gradual or sudden onset?     gradually 5. PATTERN Does the pain come and go, or is it constant?     States started out crampy, constant 6. SEVERITY: How bad is the pain?  (e.g., Scale 1-10; mild, moderate, or severe)     mild 7. RECURRENT SYMPTOM: Have you ever had this type of stomach pain before? If Yes, ask: When was the last time? and What happened that time?      yes 8. CAUSE: What do you think is causing the stomach pain? (e.g., gallstones, recent abdominal surgery)     States a reaction to her RA infusion 9. RELIEVING/AGGRAVATING FACTORS: What makes it better or worse? (e.g., antacids, bending or twisting motion, bowel movement)     Standing or sitting up  right 10. OTHER SYMPTOMS: Do you have any other symptoms? (e.g., back pain, diarrhea, fever, urination pain, vomiting)       denies 11. PREGNANCY: Is there any chance you are pregnant? When was your last menstrual period?       na  Protocols used: Abdominal Pain - Female-A-AH

## 2024-06-10 NOTE — Telephone Encounter (Signed)
 Patient sent a MyChart message at 8:34am today and [facility] responded advising patient to seek urgent evaluation by PCP today.

## 2024-06-10 NOTE — Telephone Encounter (Signed)
 Pt would like a call from the nurse or Waddell Craze. Pt stated she may be having stomach problems due to medication.

## 2024-06-11 ENCOUNTER — Encounter: Payer: Self-pay | Admitting: Gastroenterology

## 2024-06-11 ENCOUNTER — Encounter: Payer: Self-pay | Admitting: Family Medicine

## 2024-06-11 ENCOUNTER — Ambulatory Visit: Payer: Self-pay | Admitting: Family Medicine

## 2024-06-11 LAB — COMPREHENSIVE METABOLIC PANEL WITH GFR
ALT: 22 U/L (ref 0–35)
AST: 11 U/L (ref 0–37)
Albumin: 4.1 g/dL (ref 3.5–5.2)
Alkaline Phosphatase: 46 U/L (ref 39–117)
BUN: 14 mg/dL (ref 6–23)
CO2: 27 meq/L (ref 19–32)
Calcium: 9.2 mg/dL (ref 8.4–10.5)
Chloride: 100 meq/L (ref 96–112)
Creatinine, Ser: 0.74 mg/dL (ref 0.40–1.20)
GFR: 86.25 mL/min (ref >60.00)
Glucose, Bld: 87 mg/dL (ref 70–99)
Potassium: 3.9 meq/L (ref 3.5–5.1)
Sodium: 137 meq/L (ref 135–145)
Total Bilirubin: 0.3 mg/dL (ref 0.2–1.2)
Total Protein: 6.8 g/dL (ref 6.0–8.3)

## 2024-06-11 LAB — LIPASE: Lipase: 36 U/L (ref 11.0–59.0)

## 2024-06-11 LAB — CBC WITH DIFFERENTIAL/PLATELET
Basophils Absolute: 0.1 K/uL (ref 0.0–0.1)
Basophils Relative: 0.8 % (ref 0.0–3.0)
Eosinophils Absolute: 0.2 K/uL (ref 0.0–0.7)
Eosinophils Relative: 2.3 % (ref 0.0–5.0)
HCT: 37.2 % (ref 36.0–46.0)
Hemoglobin: 12.4 g/dL (ref 12.0–15.0)
Lymphocytes Relative: 25.8 % (ref 12.0–46.0)
Lymphs Abs: 1.9 K/uL (ref 0.7–4.0)
MCHC: 33.3 g/dL (ref 30.0–36.0)
MCV: 91.6 fl (ref 78.0–100.0)
Monocytes Absolute: 0.7 K/uL (ref 0.1–1.0)
Monocytes Relative: 9.4 % (ref 3.0–12.0)
Neutro Abs: 4.7 K/uL (ref 1.4–7.7)
Neutrophils Relative %: 61.7 % (ref 43.0–77.0)
Platelets: 274 K/uL (ref 150.0–400.0)
RBC: 4.06 Mil/uL (ref 3.87–5.11)
RDW: 14.5 % (ref 11.5–15.5)
WBC: 7.5 K/uL (ref 4.0–10.5)

## 2024-06-11 LAB — AMYLASE: Amylase: 30 U/L (ref 27–131)

## 2024-06-12 ENCOUNTER — Other Ambulatory Visit: Payer: Self-pay | Admitting: Medical Genetics

## 2024-06-14 ENCOUNTER — Other Ambulatory Visit (HOSPITAL_COMMUNITY): Payer: Self-pay | Admitting: Rheumatology

## 2024-06-14 DIAGNOSIS — M0579 Rheumatoid arthritis with rheumatoid factor of multiple sites without organ or systems involvement: Secondary | ICD-10-CM | POA: Insufficient documentation

## 2024-06-17 ENCOUNTER — Other Ambulatory Visit (HOSPITAL_COMMUNITY): Payer: Self-pay

## 2024-06-17 ENCOUNTER — Other Ambulatory Visit: Payer: Self-pay

## 2024-06-17 ENCOUNTER — Other Ambulatory Visit: Payer: Self-pay | Admitting: Family Medicine

## 2024-06-17 DIAGNOSIS — I10 Essential (primary) hypertension: Secondary | ICD-10-CM

## 2024-06-18 DIAGNOSIS — H524 Presbyopia: Secondary | ICD-10-CM | POA: Diagnosis not present

## 2024-06-18 DIAGNOSIS — H5213 Myopia, bilateral: Secondary | ICD-10-CM | POA: Diagnosis not present

## 2024-06-18 DIAGNOSIS — H52221 Regular astigmatism, right eye: Secondary | ICD-10-CM | POA: Diagnosis not present

## 2024-06-19 ENCOUNTER — Other Ambulatory Visit: Payer: Self-pay | Admitting: Physician Assistant

## 2024-06-19 ENCOUNTER — Other Ambulatory Visit: Payer: Self-pay

## 2024-06-19 ENCOUNTER — Other Ambulatory Visit (HOSPITAL_COMMUNITY): Payer: Self-pay

## 2024-06-19 ENCOUNTER — Encounter (HOSPITAL_COMMUNITY): Payer: Self-pay | Admitting: Rheumatology

## 2024-06-19 ENCOUNTER — Other Ambulatory Visit (HOSPITAL_BASED_OUTPATIENT_CLINIC_OR_DEPARTMENT_OTHER): Payer: Self-pay

## 2024-06-19 MED ORDER — PREDNISONE 5 MG PO TABS
10.0000 mg | ORAL_TABLET | Freq: Every day | ORAL | 0 refills | Status: AC
  Filled 2024-06-19: qty 60, 30d supply, fill #0

## 2024-06-19 MED ORDER — DICLOFENAC SODIUM 1 % EX GEL
CUTANEOUS | 2 refills | Status: AC
  Filled 2024-06-19: qty 400, 25d supply, fill #0

## 2024-06-19 NOTE — Telephone Encounter (Signed)
 Patient called the office stating that she had her second infusion on 06/04/2024 and is still in lots of pain in the shoulders. Pain is in both shoulders but wose in the left shoulder as well as the right hand and wrist. Patient would like to have a prescription of steroids if possible or would like to know what she should do. Please advise.

## 2024-06-19 NOTE — Telephone Encounter (Signed)
 Last Fill: 01/28/2022  Next Visit: 09/02/2024  Last Visit: 05/30/2024  Dx:  Rheumatoid arthritis involving multiple sites with positive rheumatoid factor   Current Dose per office note on 05/30/2024: dose not mentioned  Okay to refill Voltaren  Gel?

## 2024-06-20 ENCOUNTER — Encounter: Payer: Self-pay | Admitting: Internal Medicine

## 2024-06-20 ENCOUNTER — Ambulatory Visit: Attending: Internal Medicine | Admitting: Internal Medicine

## 2024-06-20 ENCOUNTER — Other Ambulatory Visit: Payer: Self-pay

## 2024-06-20 ENCOUNTER — Other Ambulatory Visit (HOSPITAL_COMMUNITY): Payer: Self-pay

## 2024-06-20 ENCOUNTER — Telehealth: Payer: Self-pay | Admitting: Physician Assistant

## 2024-06-20 VITALS — BP 127/69 | HR 68 | Ht 67.0 in | Wt 216.0 lb

## 2024-06-20 DIAGNOSIS — I447 Left bundle-branch block, unspecified: Secondary | ICD-10-CM | POA: Diagnosis not present

## 2024-06-20 DIAGNOSIS — E782 Mixed hyperlipidemia: Secondary | ICD-10-CM

## 2024-06-20 DIAGNOSIS — M0579 Rheumatoid arthritis with rheumatoid factor of multiple sites without organ or systems involvement: Secondary | ICD-10-CM

## 2024-06-20 DIAGNOSIS — Z79899 Other long term (current) drug therapy: Secondary | ICD-10-CM | POA: Diagnosis not present

## 2024-06-20 DIAGNOSIS — I10 Essential (primary) hypertension: Secondary | ICD-10-CM

## 2024-06-20 DIAGNOSIS — Z8249 Family history of ischemic heart disease and other diseases of the circulatory system: Secondary | ICD-10-CM

## 2024-06-20 MED ORDER — PRAVASTATIN SODIUM 20 MG PO TABS
20.0000 mg | ORAL_TABLET | Freq: Every evening | ORAL | 3 refills | Status: DC
  Filled 2024-06-20: qty 90, 90d supply, fill #0

## 2024-06-20 NOTE — Telephone Encounter (Signed)
 Patient contacted the office and left message stating that she received your message. Patient states she would like the Prednisone  taper sent to the [facility].

## 2024-06-20 NOTE — Telephone Encounter (Signed)
 Patient advised in telephone call note from today.

## 2024-06-20 NOTE — Telephone Encounter (Signed)
 Ok to transfer prescription sent yesterday

## 2024-06-20 NOTE — Telephone Encounter (Signed)
 I attempted to call the patient to discuss options but had to leave a voicemail.  Recommend staying on 10 mg of prednisone  day.   We can discuss possible using of IV remicade  (+low dose methotrexate) or in-office administered cimzia.

## 2024-06-20 NOTE — Telephone Encounter (Signed)
 Return call went straight to voicemail.

## 2024-06-20 NOTE — Progress Notes (Signed)
 Cardiology Office Note   Date:  06/20/2024  ID:  Sharon Hunter, DOB [birth year], MRN [id number] PCP: Antonio Meth, Sharon SAUNDERS, DO  [facility] Providers Cardiologist:  Emeline FORBES Calender, MD     History of Present Illness Sharon Hunter is a 63 y.o. female with a past medical history of hypertension, rate related left bundle branch block, rheumatoid arthritis, contrast allergy, family history of premature CAD, hyperlipidemia, obesity who presents today for annual follow-up.  Patient states that she has been doing okay overall since last time she has seen Dr. Alveta but unfortunately she had 3 family members passed away in the past year and has also had a flareup of her rheumatoid arthritis which was significantly impacting her mobility.  She is considering cord blood stem cells to assist with management and started on prednisone  therapy today.  Due to her RA she has now recently started on disability.  Regarding her lipids, she was previously on rosuvastatin  10 mg but had diarrhea with this and stopped.  She has not tried any other statins since then.  She denies any chest pain, pressure or palpitations, exertional shortness of breath or lower extremity edema  Tobacco use: No Alcohol use: 6 glasses of wine per week Activity level: Limited activity due to rheumatoid arthritis flare Family history: Premature CAD in father and sister in their 37s    ROS:  Review of Systems  All other systems reviewed and are negative.   Physical Exam  Physical Exam Vitals and nursing note reviewed.  Constitutional:      Appearance: Normal appearance.  HENT:     Head: Normocephalic and atraumatic.  Eyes:     Conjunctiva/sclera: Conjunctivae normal.  Neck:     Vascular: No carotid bruit.  Cardiovascular:     Rate and Rhythm: Normal rate and regular rhythm.     Heart sounds: Murmur heard.  Pulmonary:     Effort: Pulmonary effort is normal.     Breath sounds: Normal breath sounds.   Musculoskeletal:        General: No swelling or tenderness.  Skin:    Coloration: Skin is not jaundiced or pale.  Neurological:     Mental Status: She is alert.     VS:  BP 127/69 (BP Location: Left Arm, Patient Position: Sitting, Cuff Size: Large)   Pulse 68   Ht 5' 7 (1.702 m)   Wt 216 lb (98 kg)   LMP 10/03/2005   SpO2 96%   BMI 33.83 kg/m         Wt Readings from Last 3 Encounters:  06/20/24 216 lb (98 kg)  06/10/24 218 lb 12.8 oz (99.2 kg)  05/30/24 211 lb (95.7 kg)     EKG Interpretation Date/Time:    Ventricular Rate:    PR Interval:    QRS Duration:    QT Interval:    QTC Calculation:   R Axis:      Text Interpretation:      Studies Reviewed   Coronary calcium  score 01/23/2023: 38-79th percentile   Echocardiogram 06/10/2022: EF 60 to 65% with no regional wall motion abnormalities Mild MR   Risk Assessment/Calculations             ASCVD risk score: The 10-year ASCVD risk score (Arnett DK, et al., 2019) is: 5.3%   Values used to calculate the score:     Age: 16 years     Clincally relevant sex: Female     Is Non-Hispanic African  American: No     Diabetic: No     Tobacco smoker: No     Systolic Blood Pressure: 127 mmHg     Is BP treated: Yes     HDL Cholesterol: 93 mg/dL     Total Cholesterol: 253 mg/dL  LPa 12 in 7976  ASSESSMENT  Hypertension controlled on amlodipine  5 mg, valsartan  80 mg, HCTZ 12.5 mg Elevated coronary calcium  score 38 Rate related left bundle branch block no ischemic evaluation on file as patient is waiting to turn 65 Rheumatoid arthritis Mitral regurgitation Hyperlipidemia Hypertriglyceridemia, possibly due to alcohol use   Plan  Given the fact the patient has a calcium  score of 38, family history of premature CAD and rheumatoid arthritis, would prefer to have the patient on a statin.  She is in agreement to trialing and will start pravastatin  20 mg.  She will let us  know if she is having adverse effects.    Lipid panel in 3 months Cardiac risk counseling and prevention recommendations: Heart healthy/Mediterranean diet with whole grains, fruits, vegetable, fish, lean meats, nuts, and olive oil. Limit salt. Moderate walking, 3-5 times/week for 30-50 minutes each session. Aim for at least 150 minutes.week. Goal should be pace of 3 miles/hour, or walking 1.5 miles in 30 minutes Avoidance of tobacco products. Avoid excess alcohol.  Follow up: 1 year          Signed, Emeline FORBES Calender, MD

## 2024-06-20 NOTE — Telephone Encounter (Signed)
 Patient called back stating she received a phone call from our office. Pt is requesting a call back from [place].

## 2024-06-20 NOTE — Telephone Encounter (Signed)
 Patient advised W[facility] attempted to call the patient to discuss options but had to leave a voicemail.  Recommend staying on 10 mg of prednisone  day.   We can discuss possible using of IV remicade  (+low dose methotrexate) or in-office administered cimzia. Patient verbalized understanding.

## 2024-06-20 NOTE — Telephone Encounter (Signed)
 She will be unable to switch biologics until when she would be due for her next infusion.  Ok to schedule sooner visit to discuss options in detail and obtain consent.

## 2024-06-20 NOTE — Patient Instructions (Signed)
 Medication Instructions:  Your physician has recommended you make the following change in your medication:   1-START Pravastatin  20 mg by mouth daily.  *If you need a refill on your cardiac medications before your next appointment, please call your pharmacy*  Lab Work: Your physician recommends that you have lipid panel done.  If you have labs (blood work) drawn today and your tests are completely normal, you will receive your results only by: MyChart Message (if you have MyChart) OR A paper copy in the mail If you have any lab test that is abnormal or we need to change your treatment, we will call you to review the results.  Testing/Procedures: None ordered today.  Follow-Up: At [facility], you and your health needs are our priority.  As part of our continuing mission to provide you with exceptional heart care, our providers are all part of one team.  This team includes your primary Cardiologist (physician) and Advanced Practice Providers or APPs (Physician Assistants and Nurse Practitioners) who all work together to provide you with the care you need, when you need it.  Your next appointment:   1 year(s)  Provider:   Emeline FORBES Calender, MD    We recommend signing up for the patient portal called MyChart.  Sign up information is provided on this After Visit Summary.  MyChart is used to connect with patients for Virtual Visits (Telemedicine).  Patients are able to view lab/test results, encounter notes, upcoming appointments, etc.  Non-urgent messages can be sent to your provider as well.   To learn more about what you can do with MyChart, go to [email or web address].

## 2024-06-21 ENCOUNTER — Other Ambulatory Visit (HOSPITAL_COMMUNITY): Payer: Self-pay

## 2024-06-21 NOTE — Telephone Encounter (Signed)
 Contacted the patient and advised She will be unable to switch biologics until when she would be due for her next infusion.  Ok to schedule sooner visit to discuss options in detail and obtain consent. Scheduled that patient an appointment for 07/02/2024 to discuss treatment options.

## 2024-06-21 NOTE — Progress Notes (Unsigned)
 Office Visit Note  Patient: Sharon Hunter             Date of Birth: 1960/10/09           MRN: [id number]             PCP: Antonio Cyndee Jamee JONELLE, DO Referring: Antonio Cyndee Jamee JONELLE, * Visit Date: 07/02/2024 Occupation: Data Unavailable  Subjective:  Medication monitoring   History of Present Illness: Sharon Hunter is a 63 y.o. female with history of seropositive rheumatoid arthritis and osteoarthritis.  Patient's last Simponi  Aria infusion was administered on 06/14/2024.  Patient has continued to flare despite reinitiating Simponi  Aria.  Patient is currently taking prednisone  10 mg daily.  Patient states that she has done her own research and is planning to proceed with umbilical cord mesenchymal stem cell therapy under the [facility] program.  Patient reports that she has been told that she will need to be off of prednisone  and all NSAIDs prior to receiving the treatment. Patient would like to hold off on switching to another biologic at this time and would like to see her response to the umbilical cord stem cell therapy first.  She plans on continuing to use bilateral [facility] joint braces for support which have been helpful.   Activities of Daily Living:  Patient reports joint stiffness all day  Patient Reports nocturnal pain.  Difficulty dressing/grooming: Reports Difficulty climbing stairs: Denies Difficulty getting out of chair: Denies Difficulty using hands for taps, buttons, cutlery, and/or writing: Reports  Review of Systems  Constitutional:  Positive for fatigue.  HENT:  Positive for mouth dryness. Negative for mouth sores.   Eyes:  Positive for dryness.  Respiratory:  Negative for shortness of breath.   Cardiovascular:  Negative for chest pain and palpitations.  Gastrointestinal:  Negative for blood in stool, constipation and diarrhea.  Endocrine: Negative for increased urination.  Genitourinary:  Negative for involuntary urination.  Musculoskeletal:  Positive for  joint pain, joint pain, joint swelling, myalgias, morning stiffness and myalgias. Negative for gait problem, muscle weakness and muscle tenderness.  Skin:  Negative for color change, rash, hair loss and sensitivity to sunlight.  Allergic/Immunologic: Positive for susceptible to infections.  Neurological:  Negative for dizziness and headaches.  Hematological:  Negative for swollen glands.  Psychiatric/Behavioral:  Positive for depressed mood and sleep disturbance. The patient is not nervous/anxious.     PMFS History:  Patient Active Problem List   Diagnosis Date Noted   Rheumatoid arthritis involving multiple sites with positive rheumatoid factor (HCC) 06/14/2024   Osteopenia 04/23/2024   Depression with anxiety 04/23/2024   Acute otitis media 08/11/2023   Mood disorder (HCC)- Emotional Eating 08/02/2022   Class 1 obesity with serious comorbidity and body mass index (BMI) of 30.0 to 30.9 in adult 08/02/2022   At risk for impaired metabolic function 05/30/2022   Perioral dermatitis 05/26/2022   Preventative health care 05/26/2022   Essential hypertension 05/26/2022   Abnormal EKG 05/26/2022   Prediabetes 03/09/2022   Vitamin B12 deficiency 03/09/2022   Primary hypertension 02/22/2022   Hyperlipidemia 02/22/2022   GAD (generalized anxiety disorder) 02/22/2022   Vitamin D  deficiency 02/22/2022   Ear pain, left 08/13/2021   History of anxiety 03/27/2017   History of hypertension 03/27/2017   History of bundle branch block 03/27/2017   Primary osteoarthritis of both hands 12/22/2016   History of shingles 08/02/2016   High risk medication use 08/02/2016   Hypertension 08/01/2016   IBS (irritable  bowel syndrome) 08/01/2016   Anxiety 08/01/2016   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   LBBB (left bundle branch block) 01/30/2015   Palpitations 01/30/2015   Pulmonary nodule 07/04/2011   Murmur 02/03/2011   Rheumatoid arthritis (HCC) 02/03/2011   Hair loss 02/03/2011    Fatigue 02/03/2011    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   Hypertension    IBS (irritable bowel syndrome) 08/01/2016   Joint pain    Lactose intolerance    Migraine    Murmur 02/03/2011   Obese    Ovarian failure    Palpitation    Pulmonary nodule, left 2009   last CT Nov 2011-- due 08/2011   Rheumatoid arthritis(714.0)    in remission , off meds as of Summer 2012   Shingles 08/2023   Vitamin D  deficiency     Family History  Problem Relation Age of Onset   Cancer Mother    Arthritis Mother    Uterine cancer Mother    Hypertension Mother    Diabetes Mother    Esophageal cancer Mother    Other Mother        small bowel obstruction   Alcohol abuse Father    Hyperlipidemia Father    Heart disease Father    High blood pressure Father    Sleep apnea Father    Uterine cancer Sister    Heart disease Sister        cardiac stent   Diabetes Sister    Hyperlipidemia Sister    Hypertension Sister    Stroke Sister    Alcohol abuse Brother    Diabetes Brother    Hypertension Brother    Liver cancer Brother    Cancer Brother        lung   Cancer Maternal Aunt        lung----non smoker   Past Surgical History:  Procedure Laterality Date   CHOLECYSTECTOMY  1999   COLONOSCOPY  10/31/11   EXPLORATORY LAPAROTOMY     TONSILLECTOMY  1969   WISDOM TOOTH EXTRACTION     Social History   Tobacco Use   Smoking status: Former    Current packs/day: 0.00    Types: Cigarettes    Quit date: 10/03/1990    Years since quitting: 33.7    Passive exposure: Past   Smokeless tobacco: Never  Vaping Use   Vaping status: Never Used  Substance Use Topics   Alcohol use: Yes    Alcohol/week: 3.0 standard drinks of alcohol    Types: 3 Standard drinks or equivalent per week    Comment: Red wine 4 oz occ   Drug use: No   Social History   Social History Narrative   Exercise--  jogs     Immunization History  Administered Date(s) Administered   Influenza Split 07/03/2013   Influenza,inj,Quad PF,6+ Mos 07/01/2023   Influenza,inj,Quad PF,6-35 Mos 07/17/2020   Influenza-Unspecified 07/03/2014, 07/04/2015   PFIZER(Purple Top)SARS-COV-2 Vaccination 01/13/2020, 05/15/2020   Tdap 12/01/2009     Objective: Vital Signs: BP 131/79   Pulse 64   Temp 97.8 F (36.6 C)   Resp 14   Ht 5' 7 (1.702 m)   Wt 224 lb (101.6 kg)   LMP 10/03/2005   BMI 35.08 kg/m    Physical Exam Vitals and nursing note reviewed.  Constitutional:      Appearance:  She is well-developed.  HENT:     Head: Normocephalic and atraumatic.  Eyes:     Conjunctiva/sclera: Conjunctivae normal.  Cardiovascular:     Rate and Rhythm: Normal rate and regular rhythm.     Heart sounds: Normal heart sounds.  Pulmonary:     Effort: Pulmonary effort is normal.     Breath sounds: Normal breath sounds.  Abdominal:     General: Bowel sounds are normal.     Palpations: Abdomen is soft.  Musculoskeletal:     Cervical back: Normal range of motion.  Lymphadenopathy:     Cervical: No cervical adenopathy.  Skin:    General: Skin is warm and dry.     Capillary Refill: Capillary refill takes less than 2 seconds.  Neurological:     Mental Status: She is alert and oriented to person, place, and time.  Psychiatric:        Behavior: Behavior normal.      Musculoskeletal Exam: C-spine, thoracic spine, lumbar spine have good range of motion.  No midline spinal tenderness.  No SI joint tenderness.   Painful range of motion of both shoulders with tenderness of the left shoulder on exam.  Tenderness of both CMC joints.  Thickening and tenderness of bilateral 2nd and 3rd MCP joints. Complete fist formation bilaterally.  Hip joints have good range of motion with no groin pain.  Knee joints have good range of motion no warmth or effusion.  Ankle joints have good range of motion no tenderness or joint swelling.     CDAI Exam: CDAI Score: -- Patient Global: --; Provider Global: -- Swollen: --; Tender: -- Joint Exam 07/02/2024   No joint exam has been documented for this visit   There is currently no information documented on the homunculus. Go to the Rheumatology activity and complete the homunculus joint exam.  Investigation: No additional findings.  Imaging: No results found.   Recent Labs: Lab Results  Component Value Date   WBC 7.5 06/10/2024   HGB 12.4 06/10/2024   PLT 274.0 06/10/2024   NA 137 06/10/2024   K 3.9 06/10/2024   CL 100 06/10/2024   CO2 27 06/10/2024   GLUCOSE 87 06/10/2024   BUN 14 06/10/2024   CREATININE 0.74 06/10/2024   BILITOT 0.3 06/10/2024   ALKPHOS 46 06/10/2024   AST 11 06/10/2024   ALT 22 06/10/2024   PROT 6.8 06/10/2024   ALBUMIN 4.1 06/10/2024   CALCIUM  9.2 06/10/2024   GFRAA 86 01/07/2021   QFTBGOLDPLUS Negative 05/07/2024    Speciality Comments: TB Gold: 07/28/2023 Neg  Prior therapy: methotrexate, remicade , orencia, and enbrel (inadequate response), Simponi  stopped Dec 2023, Actemra  started 10/17/22-June 2024; Xeljanz  liq started 03/27/23  Procedures:  No procedures performed Allergies: Other, Contrast media [iodinated contrast media], and Rosuvastatin    Assessment / Plan:     Visit Diagnoses: Rheumatoid arthritis involving multiple sites with positive rheumatoid factor (HCC) - D/d xeljanz  due to shingles outbreak, inadequate response. Prev therapy:methotrexate, Remicade , Orencia, Enbrel, Simponi , Actemra , Xeljanz : Patient received the second dose of Simponi  Aria on 06/14/2024.  She did not notice any clinical benefit since reinitiating Simponi  Aria and presented today to discuss treatment alternatives.  The patient has done her own research and is interested in proceeding with umbilical cord mesenchymal stem cell therapy under the care of [facility].  She does not want to switch biologic medications at this time.  Different treatment  options were discussed today including proceeding with rituximab, Cimzia, or anakinra.  Also  discussed the option of a vagal nerve stimulator which she has declined due to not wanting to proceed with any implantable devices.  She plans on coming off of all immunosuppression at this time.  She is currently taking prednisone  10 mg daily which she is aware that she will have to hold prior to receiving stem cell therapy.  She will notify us  of the results of this treatment.  She will follow-up in the office in 3 months or sooner if needed.  High risk medication use -last dose of Simponi  Aria was administered on 06/14/2024.  She does not plan to proceed with the next dose of Simponi  Aria at this time. Previous treatment includes Xeljanz , methotrexate, Remicade , Orencia, Enbrel, Simponi , Actemra , and Remicade . Future treatment options could include an office administered Cimzia, rituximab, or anakinra.  TB Gold negative on 05/07/2024.  CBC and CMP updated on 06/10/2024.  Long term (current) use of systemic steroids - Prednisone  10 mg daily.  She is aware of the risks of long-term corticosteroid use.  Patient is hoping to come off of prednisone  completely after undergoing umbilical cord mesenchymal stem cell therapy.  Chronic pain of both shoulders - Chronic pain and stiffness.  Tenderness of the left shoulder noted on examination today.  Primary osteoarthritis of both hands - Chronic pain.  Patient is wearing CMC joint braces on both hands which have been helpful.  Other fatigue - Chronic-persistent-related to difficulty sleeping at night due to nocturnal pain.  Sicca syndrome - Chronic dry eyes  History of vitamin D  deficiency: She is taking vitamin D  5000 units daily.  Age-related osteoporosis without current pathological fracture - DEXA 04/17/2024:completed the one year course of evenity  (completed 01/11/24), transitioned to prolia  injections every 6 months.  She received prolia  on 02/05/24.  Other medical  conditions are listed as follows:  Pulmonary nodule  History of hypertension: Blood pressure was 131/79 today in the office.  History of bundle branch block  History of anxiety  History of hyperlipidemia  History of shingles  Orders: No orders of the defined types were placed in this encounter.  No orders of the defined types were placed in this encounter.    Follow-Up Instructions: Return in about 3 months (around 10/01/2024) for Rheumatoid arthritis, Osteoarthritis.   Waddell CHRISTELLA Craze, PA-C  Note - This record has been created using Dragon software.  Chart creation errors have been sought, but may not always  have been located. Such creation errors do not reflect on  the standard of medical care.

## 2024-06-24 ENCOUNTER — Other Ambulatory Visit (HOSPITAL_BASED_OUTPATIENT_CLINIC_OR_DEPARTMENT_OTHER): Payer: Self-pay

## 2024-06-25 ENCOUNTER — Other Ambulatory Visit (HOSPITAL_COMMUNITY): Payer: Self-pay

## 2024-06-27 ENCOUNTER — Other Ambulatory Visit: Payer: Self-pay | Admitting: Internal Medicine

## 2024-06-27 ENCOUNTER — Other Ambulatory Visit (HOSPITAL_COMMUNITY): Payer: Self-pay

## 2024-06-28 ENCOUNTER — Other Ambulatory Visit (HOSPITAL_COMMUNITY): Payer: Self-pay

## 2024-06-28 MED ORDER — HYDROCHLOROTHIAZIDE 25 MG PO TABS
12.5000 mg | ORAL_TABLET | Freq: Every day | ORAL | 1 refills | Status: AC
  Filled 2024-06-28: qty 45, 90d supply, fill #0
  Filled 2024-09-28: qty 45, 90d supply, fill #1

## 2024-07-02 ENCOUNTER — Encounter: Payer: Self-pay | Admitting: Physician Assistant

## 2024-07-02 ENCOUNTER — Ambulatory Visit: Attending: Physician Assistant | Admitting: Physician Assistant

## 2024-07-02 ENCOUNTER — Encounter (HOSPITAL_COMMUNITY)

## 2024-07-02 VITALS — BP 131/79 | HR 64 | Temp 97.8°F | Resp 14 | Ht 67.0 in | Wt 224.0 lb

## 2024-07-02 DIAGNOSIS — M19041 Primary osteoarthritis, right hand: Secondary | ICD-10-CM | POA: Diagnosis not present

## 2024-07-02 DIAGNOSIS — Z8679 Personal history of other diseases of the circulatory system: Secondary | ICD-10-CM

## 2024-07-02 DIAGNOSIS — M0579 Rheumatoid arthritis with rheumatoid factor of multiple sites without organ or systems involvement: Secondary | ICD-10-CM

## 2024-07-02 DIAGNOSIS — M19042 Primary osteoarthritis, left hand: Secondary | ICD-10-CM

## 2024-07-02 DIAGNOSIS — Z8659 Personal history of other mental and behavioral disorders: Secondary | ICD-10-CM

## 2024-07-02 DIAGNOSIS — R5383 Other fatigue: Secondary | ICD-10-CM | POA: Diagnosis not present

## 2024-07-02 DIAGNOSIS — M810 Age-related osteoporosis without current pathological fracture: Secondary | ICD-10-CM | POA: Diagnosis not present

## 2024-07-02 DIAGNOSIS — Z8639 Personal history of other endocrine, nutritional and metabolic disease: Secondary | ICD-10-CM | POA: Diagnosis not present

## 2024-07-02 DIAGNOSIS — M3500 Sicca syndrome, unspecified: Secondary | ICD-10-CM

## 2024-07-02 DIAGNOSIS — M25511 Pain in right shoulder: Secondary | ICD-10-CM

## 2024-07-02 DIAGNOSIS — Z7952 Long term (current) use of systemic steroids: Secondary | ICD-10-CM | POA: Diagnosis not present

## 2024-07-02 DIAGNOSIS — R911 Solitary pulmonary nodule: Secondary | ICD-10-CM | POA: Diagnosis not present

## 2024-07-02 DIAGNOSIS — G8929 Other chronic pain: Secondary | ICD-10-CM

## 2024-07-02 DIAGNOSIS — Z79899 Other long term (current) drug therapy: Secondary | ICD-10-CM

## 2024-07-02 DIAGNOSIS — M25512 Pain in left shoulder: Secondary | ICD-10-CM

## 2024-07-02 DIAGNOSIS — Z8619 Personal history of other infectious and parasitic diseases: Secondary | ICD-10-CM

## 2024-07-08 ENCOUNTER — Other Ambulatory Visit (HOSPITAL_COMMUNITY): Payer: Self-pay

## 2024-07-08 ENCOUNTER — Encounter (HOSPITAL_COMMUNITY): Payer: Self-pay | Admitting: Rheumatology

## 2024-07-08 ENCOUNTER — Telehealth: Payer: Self-pay

## 2024-07-08 NOTE — Telephone Encounter (Signed)
 Prolia VOB initiated via [email or web address]  Next Prolia inj DUE: 10/21/23

## 2024-07-08 NOTE — Telephone Encounter (Signed)
 PA FOR PHARMACY SUBMITTED VIA LATENT. KEY: [id number]    PA DENIED

## 2024-07-10 ENCOUNTER — Encounter (HOSPITAL_COMMUNITY): Payer: Self-pay | Admitting: Rheumatology

## 2024-07-11 ENCOUNTER — Other Ambulatory Visit: Payer: Self-pay

## 2024-07-15 ENCOUNTER — Other Ambulatory Visit (HOSPITAL_COMMUNITY): Payer: Self-pay

## 2024-07-15 ENCOUNTER — Encounter (HOSPITAL_COMMUNITY): Payer: Self-pay | Admitting: Rheumatology

## 2024-07-16 ENCOUNTER — Other Ambulatory Visit (HOSPITAL_COMMUNITY): Payer: Self-pay

## 2024-07-17 ENCOUNTER — Encounter: Payer: Self-pay | Admitting: Family Medicine

## 2024-07-23 ENCOUNTER — Encounter (HOSPITAL_COMMUNITY): Payer: Self-pay | Admitting: Rheumatology

## 2024-07-25 ENCOUNTER — Other Ambulatory Visit: Payer: Self-pay | Admitting: Family Medicine

## 2024-07-25 ENCOUNTER — Other Ambulatory Visit: Payer: Self-pay

## 2024-07-25 DIAGNOSIS — M810 Age-related osteoporosis without current pathological fracture: Secondary | ICD-10-CM

## 2024-07-29 ENCOUNTER — Other Ambulatory Visit: Payer: Self-pay

## 2024-07-30 ENCOUNTER — Encounter (HOSPITAL_COMMUNITY)

## 2024-07-30 ENCOUNTER — Inpatient Hospital Stay (HOSPITAL_COMMUNITY): Admission: RE | Admit: 2024-07-30 | Source: Ambulatory Visit

## 2024-08-05 ENCOUNTER — Other Ambulatory Visit

## 2024-08-05 ENCOUNTER — Encounter (HOSPITAL_COMMUNITY): Payer: Self-pay | Admitting: Rheumatology

## 2024-08-05 ENCOUNTER — Ambulatory Visit (INDEPENDENT_AMBULATORY_CARE_PROVIDER_SITE_OTHER): Admitting: Gastroenterology

## 2024-08-05 VITALS — BP 130/78 | HR 68 | Ht 67.0 in | Wt 222.2 lb

## 2024-08-05 DIAGNOSIS — K219 Gastro-esophageal reflux disease without esophagitis: Secondary | ICD-10-CM | POA: Diagnosis not present

## 2024-08-05 DIAGNOSIS — R140 Abdominal distension (gaseous): Secondary | ICD-10-CM

## 2024-08-05 DIAGNOSIS — R194 Change in bowel habit: Secondary | ICD-10-CM

## 2024-08-05 DIAGNOSIS — Z8719 Personal history of other diseases of the digestive system: Secondary | ICD-10-CM

## 2024-08-05 DIAGNOSIS — R1011 Right upper quadrant pain: Secondary | ICD-10-CM

## 2024-08-05 MED ORDER — FAMOTIDINE 20 MG PO TABS: 20.0000 mg | ORAL_TABLET | Freq: Every day | ORAL | Status: AC

## 2024-08-05 NOTE — Progress Notes (Signed)
 Sharon Hunter [id number] [birth year]   Chief Complaint: Alternating constipation and diarrhea, bloating, abdominal pain  Referring Provider: Antonio Meth, Jamee SAUNDERS, * Primary GI MD: Dr. 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    Seen by PCP 06/10/2024 for RUQ abdominal pain.  Labs with normal CBC, CMP, lipase, amylase.  No recent abdominal imaging.  Discussed the use of AI scribe software for clinical note transcription with the patient, who gave verbal consent to proceed.  History of Present Illness Sharon Hunter is a 63 year old female with IBS and rheumatoid arthritis who presents with changes in bowel habits and bloating.  Altered bowel habits - Alternating constipation and diarrhea - Episodes of urgency and fecal incontinence - Stools occasionally yellow in color - Dark, greasy stools occur intermittently - History of IBS diagnosed 20 years ago, with diarrhea provoked by stress - Typically had bowel movements every three days since childhood - Believes symptoms may be related to changes in rheumatoid arthritis medications - Currently off all RA medications except steroids, awaiting M cell infusion  Abdominal bloating and weight gain - Significant bloating, intermittent - Bloating worsens with eating and constipation - Current weight is the highest she has ever been - Bloating new in the last 6 weeks or so  Liver enzyme abnormalities - Has noticed spikes in liver enzymes which she attributes to use of certain medications - No abnormalities on labs during episodes of upper right quadrant pain  RUQ abdominal pain - Two episodes of upper right quadrant abdominal pain in the last several months, achy in nature and resolved in a few days -  Gallbladder has been removed  Lactose intolerance - Lactose intolerant, sometimes forgets and consumes lactose, which may contribute to symptoms  Gastroesophageal reflux symptoms - Reflux symptoms occur twice weekly - Symptoms attributed to weight gain, new in the last year - Uses Tums for relief - Has reduced caffeine and wine intake, which exacerbated symptoms - No other reflux medications tried  Reports she has a hard time taking pills and has fear of upper endoscopy.  Family history of liver cancer in her brother who had cirrhosis due to drug abuse. Family history of esophageal cancer in her mother.  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   Hypertension    IBS (irritable bowel syndrome) 08/01/2016   Joint pain    Lactose intolerance    LBBB (left bundle branch block) 01/30/2015   Migraine    Murmur 02/03/2011   Obese    Osteoporosis    Ovarian failure    Palpitation    Primary osteoarthritis of both hands 12/22/2016   Pulmonary nodule, left 2009   last CT Nov 2011-- due 08/2011   Rheumatoid arthritis(714.0)    in remission , off meds as of Summer 2012   Shingles 08/2023   Vitamin D  deficiency     Past Surgical History:  Procedure Laterality Date   CHOLECYSTECTOMY  1999   COLONOSCOPY  10/31/11   EXPLORATORY LAPAROTOMY     TONSILLECTOMY  1969  WISDOM TOOTH EXTRACTION      Current Outpatient Medications  Medication Sig Dispense Refill   amLODipine  (NORVASC ) 5 MG tablet Take 1 tablet (5 mg total) by mouth daily. 90 tablet 3   Calcium -Magnesium (CAL-MAG PO) Take by mouth 2 (two) times daily.     Cholecalciferol  125 MCG (5000 UT) CHEW Chew 1 tablet by mouth daily at 6 (six) AM. 30 tablet 0   denosumab  (PROLIA ) 60 MG/ML SOSY injection Inject 60 mg  into the skin every 6 (six) months. Dx code: M81.0 1 mL 0   diclofenac  Sodium (VOLTAREN ) 1 % GEL Apply 2-4 grams to affected area up to 4 times daily. 400 g 2   Doxylamine Succinate, Sleep, (UNISOM PO) Take 1 tablet by mouth at bedtime.     escitalopram  (LEXAPRO ) 20 MG tablet Take 1 tablet (20 mg total) by mouth daily. 90 tablet 1   Golimumab  (SIMPONI  ARIA IV) Inject into the vein. 2mg /kg at Weeks 0, 4 then every 8 weeks thereafter     hydrochlorothiazide  (HYDRODIURIL ) 25 MG tablet Take 0.5 tablets (12.5 mg total) by mouth daily. 45 tablet 1   Ibuprofen (MOTRIN PO) Take by mouth as needed.     MELATONIN PO Take 5-10 mg by mouth at bedtime.     Multiple Vitamin (MULTIVITAMIN PO) Take by mouth daily.     predniSONE  (DELTASONE ) 5 MG tablet Take 2 tablets (10 mg total) by mouth daily with breakfast. 60 tablet 0   valsartan  (DIOVAN ) 80 MG tablet Take 1 tablet (80 mg total) by mouth daily. 90 tablet 3   Current Facility-Administered Medications  Medication Dose Route Frequency Provider Last Rate Last Admin   [START ON 08/06/2024] denosumab  (PROLIA ) injection 60 mg  60 mg Subcutaneous Q6 months Antonio Meth, Jamee R, DO        Allergies as of 08/05/2024 - Review Complete 08/05/2024  Allergen Reaction Noted   Other Swelling 02/03/2011   Contrast media [iodinated contrast media]  12/27/2016   Rosuvastatin  Diarrhea 10/17/2023    Family History  Problem Relation Age of Onset   Cancer Mother    Arthritis Mother    Uterine cancer Mother    Hypertension Mother    Diabetes Mother    Esophageal cancer Mother    Other Mother        small bowel obstruction   Alcohol abuse Father    Hyperlipidemia Father    Heart disease Father    High blood pressure Father    Sleep apnea Father    Uterine cancer Sister    Heart disease Sister        cardiac stent   Diabetes Sister    Hyperlipidemia Sister    Hypertension Sister    Stroke Sister    Alcohol abuse Brother    Diabetes Brother    Hypertension  Brother    Liver cancer Brother    Cancer Brother        lung   Cancer Maternal Aunt        lung----non smoker    Social History   Tobacco Use   Smoking status: Former    Current packs/day: 0.00    Types: Cigarettes    Quit date: 10/03/1990    Years since quitting: 33.8    Passive exposure: Past   Smokeless tobacco: Never  Vaping Use   Vaping status: Never Used  Substance Use Topics   Alcohol use: Yes    Alcohol/week: 3.0 standard drinks of alcohol    Types:  3 Standard drinks or equivalent per week    Comment: Red wine 4 oz occ   Drug use: No     Review of Systems:    Constitutional: No weight loss, fever, chills Cardiovascular: No chest pain Respiratory: No SOB Gastrointestinal: See HPI and otherwise negative   Physical Exam:  Vital signs: BP 130/78   Pulse 68   Ht 5' 7 (1.702 m)   Wt 222 lb 3.2 oz (100.8 kg)   LMP 10/03/2005   BMI 34.80 kg/m   Constitutional: Pleasant, obese female in NAD, alert and cooperative Head:  Normocephalic and atraumatic.  Eyes: No scleral icterus.  Respiratory: Respirations even and unlabored. Lungs clear to auscultation bilaterally.  No wheezes, crackles, or rhonchi.  Cardiovascular:  Regular rate and rhythm. No murmurs. No peripheral edema. Gastrointestinal:  Soft, nondistended, nontender. No rebound or guarding. Normal bowel sounds. No appreciable masses or hepatomegaly. Rectal:  Not performed.  Neurologic:  Alert and oriented x4;  grossly normal neurologically.  Skin:   Dry and intact without significant lesions or rashes. Psychiatric: Oriented to person, place and time. Demonstrates good judgement and reason without abnormal affect or behaviors.   RELEVANT LABS AND IMAGING: CBC    Component Value Date/Time   WBC 7.5 06/10/2024 1350   RBC 4.06 06/10/2024 1350   HGB 12.4 06/10/2024 1350   HGB 12.7 07/28/2023 1509   HCT 37.2 06/10/2024 1350   HCT 38.6 07/28/2023 1509   PLT 274.0 06/10/2024 1350   PLT 263 07/28/2023 1509    MCV 91.6 06/10/2024 1350   MCV 95 07/28/2023 1509   MCH 29.8 05/02/2024 1127   MCHC 33.3 06/10/2024 1350   RDW 14.5 06/10/2024 1350   RDW 12.6 07/28/2023 1509   LYMPHSABS 1.9 06/10/2024 1350   LYMPHSABS 3.2 (H) 07/28/2023 1509   MONOABS 0.7 06/10/2024 1350   EOSABS 0.2 06/10/2024 1350   EOSABS 0.1 07/28/2023 1509   BASOSABS 0.1 06/10/2024 1350   BASOSABS 0.0 07/28/2023 1509    CMP     Component Value Date/Time   NA 137 06/10/2024 1350   NA 138 07/28/2023 1509   K 3.9 06/10/2024 1350   CL 100 06/10/2024 1350   CO2 27 06/10/2024 1350   GLUCOSE 87 06/10/2024 1350   BUN 14 06/10/2024 1350   BUN 12 07/28/2023 1509   CREATININE 0.74 06/10/2024 1350   CREATININE 0.68 05/02/2024 1127   CALCIUM  9.2 06/10/2024 1350   PROT 6.8 06/10/2024 1350   PROT 7.3 07/28/2023 1509   ALBUMIN 4.1 06/10/2024 1350   ALBUMIN 4.7 07/28/2023 1509   AST 11 06/10/2024 1350   ALT 22 06/10/2024 1350   ALKPHOS 46 06/10/2024 1350   BILITOT 0.3 06/10/2024 1350   BILITOT 0.4 07/28/2023 1509   GFRNONAA >60 12/29/2023 0730   GFRNONAA 74 01/07/2021 1335   GFRAA 86 01/07/2021 1335   Echocardiogram 06/10/2022 1. Left ventricular ejection fraction, by estimation, is 60 to 65% . The left ventricle has normal function. The left ventricle has no regional wall motion abnormalities. Left ventricular diastolic parameters were normal.  2. Right ventricular systolic function is normal. The right ventricular size is normal.  3. The mitral valve is normal in structure. Mild mitral valve regurgitation. No evidence of mitral stenosis.  4. The aortic valve is tricuspid. Aortic valve regurgitation is not visualized. No aortic stenosis is present.  5. The inferior vena cava is normal in size with greater than 50% respiratory variability, suggesting right atrial pressure of 3 mmHg.  Assessment/Plan:    Assessment and Plan Assessment & Plan Bloating and change in bowel habits (alternating constipation and  diarrhea) Symptoms include alternating constipation and diarrhea with bloating exacerbated by eating. Possible contributing factors include medication changes and dietary factors.  Has history of IBS, previously with constipation, now having alternating bowel habits.   Bloating new in the last 6 weeks.  Has had change in bowel habits over the previous year which she attributes to changes in her rheumatoid arthritis medications.  States she has come off of most of her medications and overall feels better though continues to have intermittent loose but not watery stools.  No blood in stool.  Has noticed intermittent dark stools which she describes as dark green, not occurring frequently.  No anemia on recent labs. Found to have norovirus on GI pathogen panel in March, otherwise normal and negative C. difficile.  - Schedule colonoscopy. I thoroughly discussed the procedure with the patient to include nature of the procedure, alternatives, benefits, and risks (including but not limited to bleeding, infection, perforation, anesthesia/cardiac/pulmonary complications). Patient verbalized understanding and gave verbal consent to proceed with procedure.  (Patient will call back to schedule, needs to arrange a ride). - Provided information on low FODMAP diet. - Recommended OTC Gas-X as needed for bloating. - Consider testing versus empirically treating for SIBO if symptoms persist - Labs today: CBC, CMP - Further workup pending findings on colonoscopy.  - Consider abdominal imaging  Abdominal pain, episodic, right upper quadrant Episodic right upper quadrant pain occurred twice, with no recurrence.  History of cholecystectomy.  No recent abdominal imaging.  - Order abdominal ultrasound if pain recurs. - Declined H. pylori stool test  Gastroesophageal reflux disease without esophagitis Reflux symptoms occur twice a week, possibly related to weight gain. Managed with Tums.  New in the last year.  States she  cannot take pills due to fear regarding choking (episode of choking as a child) but has no issues swallowing food or liquid.  - Discussed adding EGD to upcoming colonoscopy, but patient has fear of EGD and does not want to schedule at this time. - Discussed using OTC Pepcid chewables - Will give information on lifestyle modifications for GERD  Lactose intolerance Lactose intolerance contributes to bloating. She occasionally consumes lactose-containing products.  - Advise avoidance of lactose-containing products.  Rheumatoid arthritis Currently off all RA medications except steroids, awaiting M cell infusion.     Camie Furbish, PA-C [facility] 08/05/2024, 3:36 PM  Patient Care Team: Antonio Meth, Jamee SAUNDERS, DO as PCP - General (Family Medicine) Kriste Emeline BRAVO, MD as PCP - Cardiology (Cardiology) Dolphus Reiter, MD as Consulting Physician (Rheumatology) Avram Lupita BRAVO, MD as Consulting Physician (Gastroenterology)

## 2024-08-05 NOTE — Patient Instructions (Addendum)
 It has been recommended to you by your physician that you have a(n) colonoscopy with Dr. Avram completed. Per your request, we did not schedule the procedure(s) today. Please contact our office at [phone] when you decide to have the procedure completed. You will be scheduled for a pre-visit and procedure at that time.  Please go to the lab in the basement of our building to have lab work done as you leave today. Hit B for basement when you get on the elevator.  When the doors open the lab is on your left.  We will call you with the results. Thank you.  We are giving you a Low-FODMAP diet handout today. FODMAPs are short-chain carbohydrates (sugars) that are highly fermentable, which means that they go through chemical changes in the GI system, and are poorly absorbed during digestion. When FODMAPs reach the colon (large intestine), bacteria ferment these sugars, turning them into gas and chemicals. This stretches the walls of the colon, causing abdominal bloating, distension, cramping, pain, and/or changes in bowel habits in many patients with IBS. FODMAPs are not unhealthy or harmful, but may exacerbate GI symptoms in those with sensitive GI tracts.     Please purchase the following medications over the counter and take as directed: Pepcid daily Gas X or Phazyme as needed  Increase dietary fiber  Thank you for entrusting me with your care and for choosing [facility], Camie Furbish, PA  _______________________________________________________  If your blood pressure at your visit was 140/90 or greater, please contact your primary care physician to follow up on this.  _______________________________________________________  If you are age 63 or older, your body mass index should be between 23-30. Your Body mass index is 34.8 kg/m. If this is out of the aforementioned range listed, please consider follow up with your Primary Care Provider.  If you are age 23 or younger, your body  mass index should be between 19-25. Your Body mass index is 34.8 kg/m. If this is out of the aformentioned range listed, please consider follow up with your Primary Care Provider.   ________________________________________________________  The  GI providers would like to encourage you to use MYCHART to communicate with providers for non-urgent requests or questions.  Due to long hold times on the telephone, sending your provider a message by [facility] may be a faster and more efficient way to get a response.  Please allow 48 business hours for a response.  Please remember that this is for non-urgent requests.  _______________________________________________________  [facility] is using a team-based approach to care.  Your team is made up of your doctor and two to three APPS. Our APPS (Nurse Practitioners and Physician Assistants) work with your physician to ensure care continuity for you. They are fully qualified to address your health concerns and develop a treatment plan. They communicate directly with your gastroenterologist to care for you. Seeing the Advanced Practice Practitioners on your physician's team can help you by facilitating care more promptly, often allowing for earlier appointments, access to diagnostic testing, procedures, and other specialty referrals.

## 2024-08-06 ENCOUNTER — Telehealth (HOSPITAL_COMMUNITY): Payer: Self-pay | Admitting: Pharmacy Technician

## 2024-08-06 ENCOUNTER — Other Ambulatory Visit: Payer: Self-pay

## 2024-08-06 ENCOUNTER — Telehealth: Payer: Self-pay | Admitting: Gastroenterology

## 2024-08-06 ENCOUNTER — Other Ambulatory Visit (HOSPITAL_COMMUNITY): Payer: Self-pay

## 2024-08-06 ENCOUNTER — Encounter: Payer: Self-pay | Admitting: Internal Medicine

## 2024-08-06 ENCOUNTER — Encounter (HOSPITAL_COMMUNITY): Payer: Self-pay | Admitting: Rheumatology

## 2024-08-06 DIAGNOSIS — R194 Change in bowel habit: Secondary | ICD-10-CM

## 2024-08-06 MED ORDER — NA SULFATE-K SULFATE-MG SULF 17.5-3.13-1.6 GM/177ML PO SOLN: 1.0000 | Freq: Once | ORAL | 0 refills | Status: AC

## 2024-08-06 MED ORDER — NA SULFATE-K SULFATE-MG SULF 17.5-3.13-1.6 GM/177ML PO SOLN
1.0000 | Freq: Once | ORAL | 0 refills | Status: DC
  Filled 2024-08-06 (×3): qty 354, 1d supply, fill #0

## 2024-08-06 NOTE — Telephone Encounter (Signed)
 Per patient request, Suprep sent to [facility] on [address]

## 2024-08-06 NOTE — Telephone Encounter (Signed)
 Auth Submission: APPROVED Site of care: [facility] Payer: [employer] Next (ins change) Medication & CPT/J Code(s) submitted: Simponi  aria (Golimumab ) [id number] Diagnosis Code: M05.79 Route of submission (phone, fax, portal): Submit through [facility]  Phone # Fax # [phone] Auth type: Buy/Bill HB Units/visits requested: 2mg /kg Q 8 WEEKS Reference number: [id number] Approval from: 08/06/24 to 08/06/25    Dagoberto Armour, CPhT [facility] Phone: [phone] 08/06/2024

## 2024-08-06 NOTE — Telephone Encounter (Signed)
 Patient was scheduled with Dr. Avram on Sharon Hunter, 11-20 at 9am. Amb ref placed and Suprep sent to [facility] pharmacy.  Instructions sent to MyChart. Called and spoke to patient and went through detailed instructions.   Received message from [facility] that Suprep is not covered.  She will call them and make sure they have her correct insurance. She doesn't have [employer] anymore and uses [employer].  [facility] has generic Suprep for $35 at [facility] on [address] in [place]. She will let us  know if she wants us  to send a script there instead

## 2024-08-06 NOTE — Telephone Encounter (Signed)
 Inbound call from patient wanting to schedule her colonoscopy. Patient was scheduled for 11/20 at 9:00 and is requesting a call back to discuss prep medication and instructions. Please advise.

## 2024-08-07 LAB — IGA: Immunoglobulin A: 222 mg/dL (ref 70–320)

## 2024-08-07 LAB — TISSUE TRANSGLUTAMINASE, IGA: (tTG) Ab, IgA: <1 U/mL

## 2024-08-07 NOTE — Telephone Encounter (Signed)
 Prolia VOB initiated via [email or web address]

## 2024-08-08 ENCOUNTER — Ambulatory Visit: Payer: Self-pay | Admitting: Gastroenterology

## 2024-08-09 ENCOUNTER — Other Ambulatory Visit: Payer: Self-pay

## 2024-08-09 ENCOUNTER — Encounter: Payer: Self-pay | Admitting: Family Medicine

## 2024-08-09 DIAGNOSIS — M810 Age-related osteoporosis without current pathological fracture: Secondary | ICD-10-CM

## 2024-08-09 NOTE — Telephone Encounter (Signed)
 For the pharmacy benefit can we send for an appeal.  Patient has had Evenity  for 12 months and has started Prolia .

## 2024-08-12 ENCOUNTER — Telehealth: Payer: Self-pay | Admitting: Pharmacist

## 2024-08-12 NOTE — Telephone Encounter (Signed)
 Information has been sent to clinical pharmacist for appeals review. It may take 5-7 days to prepare the necessary documentation to request the appeal from the insurance.

## 2024-08-12 NOTE — Telephone Encounter (Signed)
 Appeal has been submitted. Will advise when response is received or follow up in 1 week. Please be advised that most companies may take 30 days to make a decision. Appeal letter and supporting documentation have been faxed to [phone] on 08/12/2024 @1 :24 pm.  Thank you, Devere Pandy, PharmD Clinical Pharmacist  [facility]  Direct Dial: [phone]

## 2024-08-13 ENCOUNTER — Other Ambulatory Visit: Payer: Self-pay

## 2024-08-14 ENCOUNTER — Telehealth: Payer: Self-pay | Admitting: Family Medicine

## 2024-08-14 NOTE — Telephone Encounter (Signed)
 Per [employer]: Please note that there is no coverage for Prolia  through the MD purchase pathway. Specialty Pharmacy is mandatory.  Appeal for pharmacy has been submitted.

## 2024-08-14 NOTE — Telephone Encounter (Signed)
 Checking on medical benefits first.  Still have not got auth back.

## 2024-08-14 NOTE — Telephone Encounter (Signed)
 Copied from CRM [id number]. Topic: Appointments - Scheduling Inquiry for Clinic >> Aug 14, 2024  1:42 PM Rea C wrote: Reason for CRM: Patient stated that she was prior auth for a Prolia  injection and was calling to schedule.    [phone] (M)

## 2024-08-14 NOTE — Telephone Encounter (Signed)
 Has pt medical benefits came back yet from [employer]?

## 2024-08-21 ENCOUNTER — Other Ambulatory Visit: Payer: Self-pay

## 2024-08-21 ENCOUNTER — Other Ambulatory Visit (HOSPITAL_COMMUNITY): Payer: Self-pay

## 2024-08-21 MED ORDER — DENOSUMAB 60 MG/ML ~~LOC~~ SOSY
60.0000 mg | PREFILLED_SYRINGE | SUBCUTANEOUS | 0 refills | Status: AC
  Filled 2024-08-21: qty 1, 180d supply, fill #0

## 2024-08-21 NOTE — Progress Notes (Signed)
 Specialty Pharmacy Refill Coordination Note  Sharon Hunter is a 63 y.o. female assessed today regarding refills of clinic administered specialty medication(s) Denosumab  (PROLIA )   Clinic requested Courier to Provider Office   Delivery date: 08/22/24   Verified address: [address] [facility] [place] [address]   Medication will be filled on: 08/21/24  Copay: $0.00 Appointment: 11.26.25

## 2024-08-21 NOTE — Telephone Encounter (Signed)
 Pt stated that she has received message from insurance about Prolia  being overturned.

## 2024-08-21 NOTE — Telephone Encounter (Signed)
 Per test claim through [facility], this has been approved by her insurance and had a copay of $0.00

## 2024-08-21 NOTE — Addendum Note (Signed)
 Addended by: ESTELLE GILLIS D on: 08/21/2024 12:06 PM   Modules accepted: Orders

## 2024-08-22 ENCOUNTER — Encounter: Admitting: Internal Medicine

## 2024-08-22 NOTE — Telephone Encounter (Signed)
 Pt has been scheduled and rx sent to pharmacy.

## 2024-08-23 ENCOUNTER — Other Ambulatory Visit (HOSPITAL_COMMUNITY)
Admission: RE | Admit: 2024-08-23 | Discharge: 2024-08-23 | Disposition: A | Discharge status: Home / Self Care | Payer: Self-pay | Source: Ambulatory Visit | Attending: Medical Genetics | Admitting: Medical Genetics

## 2024-08-28 ENCOUNTER — Ambulatory Visit (INDEPENDENT_AMBULATORY_CARE_PROVIDER_SITE_OTHER)

## 2024-08-28 DIAGNOSIS — M810 Age-related osteoporosis without current pathological fracture: Secondary | ICD-10-CM | POA: Diagnosis not present

## 2024-08-28 MED ORDER — DENOSUMAB 60 MG/ML ~~LOC~~ SOSY: 60.0000 mg | PREFILLED_SYRINGE | SUBCUTANEOUS | Status: AC

## 2024-08-28 NOTE — Progress Notes (Signed)
 Pt was in office today for Prolia  injection,injection was given subcutaneous in L arm. Pt tolerated well, pt is aware she will receive a phone call to schedule next injection.

## 2024-09-02 ENCOUNTER — Ambulatory Visit: Admitting: Physician Assistant

## 2024-09-02 NOTE — Progress Notes (Unsigned)
 Office Visit Note  Patient: Sharon Hunter             Date of Birth: 06/23/1961           MRN: [id number]             PCP: Antonio Cyndee Jamee JONELLE, DO Referring: Antonio Cyndee Jamee JONELLE, * Visit Date: 09/03/2024 Occupation: Data Unavailable  Subjective:  Diffuse aching   History of Present Illness: Sharon Hunter is a 63 y.o. female with history of seropositive rheumatoid arthritis.  Patient reports that she has been off of prednisone  since 08/17/2024.  Patient reports that she received the mesenchymal cell infusion on 08/23/2024.  Patient states that by 08/26/2024 she had noticed mild relief.  Patient states that since then she has had a flare affecting the right hand as well as increased soreness and aching in both shoulders.  Patient states he is also having some increased fatigue and brain fog.  Patient states that she has a difficulty performing ADLs due to the pain and swelling affecting the right hand.  Has been using Voltaren  gel topically as needed for pain relief.  She can take Tylenol  as needed but was advised to avoid the use of prednisone  or NSAIDs at least two weeks out from the infusion.  She experiences some soreness and stiffness in both knee joints and both ankle joints especially during times of a flare.  She requested a temporary handicap placard to use during flares.  Patient states that she is currently having diffuse arthralgias rather than severe joint pain like she was previously experiencing with rheumatoid arthritis flares.  She was hopeful that the mesenchymal infusion will help to alleviate her symptoms with time. She continues to have intermittent diarrhea and is scheduled for a colonoscopy this month.      Activities of Daily Living:  Patient reports joint stiffness all day  Patient Reports nocturnal pain.  Difficulty dressing/grooming: Reports Difficulty climbing stairs: Denies Difficulty getting out of chair: Reports Difficulty using hands for taps, buttons,  cutlery, and/or writing: Reports  Review of Systems  Constitutional:  Positive for fatigue.  HENT:  Positive for mouth dryness. Negative for mouth sores.   Eyes:  Positive for dryness.  Respiratory:  Negative for shortness of breath.   Cardiovascular:  Negative for chest pain and palpitations.  Gastrointestinal:  Positive for diarrhea. Negative for blood in stool and constipation.  Endocrine: Negative for increased urination.  Genitourinary:  Negative for involuntary urination.  Musculoskeletal:  Positive for joint pain, joint pain, joint swelling, myalgias, muscle weakness, morning stiffness, muscle tenderness and myalgias. Negative for gait problem.  Skin:  Positive for rash. Negative for color change, hair loss and sensitivity to sunlight.  Allergic/Immunologic: Positive for susceptible to infections.  Neurological:  Negative for dizziness and headaches.  Hematological:  Negative for swollen glands.  Psychiatric/Behavioral:  Positive for sleep disturbance. Negative for depressed mood. The patient is not nervous/anxious.     PMFS History:  Patient Active Problem List   Diagnosis Date Noted   Rheumatoid arthritis involving multiple sites with positive rheumatoid factor (HCC) 06/14/2024   Osteopenia 04/23/2024   Depression with anxiety 04/23/2024   Acute otitis media 08/11/2023   Mood disorder (HCC)- Emotional Eating 08/02/2022   Class 1 obesity with serious comorbidity and body mass index (BMI) of 30.0 to 30.9 in adult 08/02/2022   At risk for impaired metabolic function 05/30/2022   Perioral dermatitis 05/26/2022   Preventative health care 05/26/2022  Essential hypertension 05/26/2022   Abnormal EKG 05/26/2022   Prediabetes 03/09/2022   Vitamin B12 deficiency 03/09/2022   Primary hypertension 02/22/2022   Hyperlipidemia 02/22/2022   GAD (generalized anxiety disorder) 02/22/2022   Vitamin D  deficiency 02/22/2022   Ear pain, left 08/13/2021   History of anxiety 03/27/2017    History of hypertension 03/27/2017   History of bundle branch block 03/27/2017   Primary osteoarthritis of both hands 12/22/2016   History of shingles 08/02/2016   High risk medication use 08/02/2016   Hypertension 08/01/2016   IBS (irritable bowel syndrome) 08/01/2016   Anxiety 08/01/2016   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   LBBB (left bundle branch block) 01/30/2015   Palpitations 01/30/2015   Pulmonary nodule 07/04/2011   Murmur 02/03/2011   Rheumatoid arthritis (HCC) 02/03/2011   Hair loss 02/03/2011   Fatigue 02/03/2011    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   Hypertension    IBS (irritable bowel syndrome) 08/01/2016   Joint pain    Lactose intolerance    LBBB (left bundle branch block) 01/30/2015   Migraine    Murmur 02/03/2011   Obese    Osteoporosis    Ovarian failure    Palpitation    Primary osteoarthritis of both hands 12/22/2016   Pulmonary nodule, left 2009   last CT Nov 2011-- due 08/2011   Rheumatoid arthritis(714.0)    in remission , off meds as of Summer 2012   Shingles 08/2023   Vitamin D  deficiency     Family History  Problem Relation Age of Onset   Cancer Mother    Arthritis Mother    Uterine cancer Mother    Hypertension Mother    Diabetes Mother    Esophageal cancer Mother    Other Mother        small bowel obstruction   Alcohol abuse Father    Hyperlipidemia Father    Heart disease Father    High blood pressure Father    Sleep apnea Father    Uterine cancer Sister    Heart disease Sister        cardiac stent   Diabetes Sister    Hyperlipidemia Sister    Hypertension Sister    Stroke Sister    Alcohol abuse Brother    Diabetes Brother    Hypertension Brother    Liver cancer Brother    Cancer Brother        lung   Cancer Maternal Aunt        lung----non smoker   Past Surgical History:  Procedure  Laterality Date   CHOLECYSTECTOMY  1999   COLONOSCOPY  10/31/11   EXPLORATORY LAPAROTOMY     TONSILLECTOMY  1969   WISDOM TOOTH EXTRACTION     Social History   Tobacco Use   Smoking status: Former    Current packs/day: 0.00    Types: Cigarettes    Quit date: 10/03/1990    Years since quitting: 33.9    Passive exposure: Past   Smokeless tobacco: Never  Vaping Use   Vaping status: Never Used  Substance Use Topics   Alcohol use: Yes    Alcohol/week: 3.0 standard drinks of alcohol    Types: 3 Standard drinks or equivalent per week    Comment: Red wine 4 oz occ   Drug use: No   Social History  Social History Narrative   Exercise-- jogs     Immunization History  Administered Date(s) Administered   Influenza Split 07/03/2013   Influenza,inj,Quad PF,6+ Mos 07/01/2023   Influenza,inj,Quad PF,6-35 Mos 07/17/2020   Influenza-Unspecified 07/03/2014, 07/04/2015   PFIZER(Purple Top)SARS-COV-2 Vaccination 01/13/2020, 05/15/2020   Tdap 12/01/2009     Objective: Vital Signs: BP 117/68   Pulse 67   Temp 98.2 F (36.8 C)   Resp 14   Ht 5' 7 (1.702 m)   Wt 221 lb 9.6 oz (100.5 kg)   LMP 10/03/2005   BMI 34.71 kg/m    Physical Exam Vitals and nursing note reviewed.  Constitutional:      Appearance: She is well-developed.  HENT:     Head: Normocephalic and atraumatic.  Eyes:     Conjunctiva/sclera: Conjunctivae normal.  Cardiovascular:     Rate and Rhythm: Normal rate and regular rhythm.     Heart sounds: Normal heart sounds.  Pulmonary:     Effort: Pulmonary effort is normal.     Breath sounds: Normal breath sounds.  Abdominal:     General: Bowel sounds are normal.     Palpations: Abdomen is soft.  Musculoskeletal:     Cervical back: Normal range of motion.  Lymphadenopathy:     Cervical: No cervical adenopathy.  Skin:    General: Skin is warm and dry.     Capillary Refill: Capillary refill takes less than 2 seconds.  Neurological:     Mental Status: She is  alert and oriented to person, place, and time.  Psychiatric:        Behavior: Behavior normal.      Musculoskeletal Exam: C-spine has discomfort with lateral rotation.  Painful range of motion of both shoulders, left greater than right.  CMC joint prominence bilaterally.  Tenderness and synovitis of several MCP and PIP joints, most severe affecting the right 2nd and 3rd MCP joints.  Hip joints have good range of motion.  Knee joints have good range of motion no warmth or effusion.  Ankle joints have good range of motion with some tenderness bilaterally.   CDAI Exam: CDAI Score: -- Patient Global: --; Provider Global: -- Swollen: 8 ; Tender: 12  Joint Exam 09/03/2024      Right  Left  Glenohumeral   Tender   Tender  MCP 1     Swollen Tender  MCP 2  Swollen Tender  Swollen Tender  MCP 3  Swollen Tender  Swollen Tender  MCP 4  Swollen Tender     PIP 2 (finger)  Swollen Tender     PIP 3 (finger)  Swollen Tender     Ankle   Tender   Tender     Investigation: No additional findings.  Imaging: No results found.  Recent Labs: Lab Results  Component Value Date   WBC 7.5 06/10/2024   HGB 12.4 06/10/2024   PLT 274.0 06/10/2024   NA 137 06/10/2024   K 3.9 06/10/2024   CL 100 06/10/2024   CO2 27 06/10/2024   GLUCOSE 87 06/10/2024   BUN 14 06/10/2024   CREATININE 0.74 06/10/2024   BILITOT 0.3 06/10/2024   ALKPHOS 46 06/10/2024   AST 11 06/10/2024   ALT 22 06/10/2024   PROT 6.8 06/10/2024   ALBUMIN 4.1 06/10/2024   CALCIUM  9.2 06/10/2024   GFRAA 86 01/07/2021   QFTBGOLDPLUS Negative 05/07/2024    Speciality Comments: TB Gold: 07/28/2023 Neg  Prior therapy: methotrexate, remicade , orencia, and enbrel (inadequate response), Simponi  stopped  Dec 2023, Actemra  started 10/17/22-June 2024; Xeljanz  liq started 03/27/23  Procedures:  No procedures performed Allergies: Other, Contrast media [iodinated contrast media], and Rosuvastatin       Assessment / Plan:     Visit  Diagnoses: Rheumatoid arthritis involving multiple sites with positive rheumatoid factor (HCC) - - D/d xeljanz  due to shingles outbreak, inadequate response. Prev therapy:methotrexate, Remicade , Orencia, Enbrel, Simponi , Actemra , Xeljanz : Patient presents today experiencing a flare affecting both hands and both shoulders.  She has tenderness and synovitis affecting several MCP and PIP joints as described above.  She has discomfort and stiffness with range of motion of both shoulders.  She has been using Voltaren  gel topically as needed for symptomatic relief.  She has been off of prednisone  since 08/17/2024.  She underwent a mesenchymal cell infusion on 08/23/2024 and started to notice mild relief by 08/26/2024.  She is now having diffuse arthralgias, fatigue, and brain fog.  According to the patient her joint pain feels different than a typical rheumatoid arthritis flare so she is hopeful that she will have relief with the mesenchymal cell infusion with time.  She does not want to make any medication changes at this time. A temporary handicap placard was provided to the patient to use as needed during flares. She plans on keeping her follow-up visit scheduled on 10/07/2024 to assess her response to the infusion.  High risk medication use -Off prednisone  since 08/17/2024.  Received the mesenchymal cell infusion on 08/23/2024. Last dose of Simponi  Aria was administered on 06/14/2024.  She does not plan to proceed with the next dose of Simponi  Aria at this time. Previous treatment includes Xeljanz , Arava , methotrexate, Remicade , Orencia, Enbrel, Simponi , Actemra , and Remicade . Future treatment options could include an office administered Cimzia, rituximab, Plaquenil, or anakinra.  TB Gold negative on 05/07/2024.   CBC and CMP updated on 06/10/2024. Orders for CBC and CMP released today.   Plan: CBC with Differential/Platelet, Comprehensive metabolic panel with GFR  Long term (current) use of systemic steroids -  Off prednisone  since 08/17/24.   Chronic pain of both shoulders: She continues to have chronic pain and stiffness in both shoulders, left worse than right.    Primary osteoarthritis of both hands: Patient presents today experiencing a flare affecting both hands, right greater than left.  She has had difficulty performing ADLs due to the severity of symptoms.   Other fatigue: Patient is currently experiencing increased fatigue and brain fog.  She has difficulty sleeping at night due to nocturnal pain.  Sicca syndrome: Patient continues to have chronic sicca symptoms which have been stable.  Age-related osteoporosis without current pathological fracture - DEXA 04/17/2024:completed the one year course of evenity  (completed 01/11/24), transitioned to prolia  injections every 6 months.  She received prolia  on 08/28/24.   History of vitamin D  deficiency: She is taking vitamin D  5000 units daily.  Other medical conditions are listed as follows:  Pulmonary nodule  History of hypertension: Blood pressure is 117/68 today in the office.  History of bundle branch block  History of anxiety  History of hyperlipidemia  History of shingles  Orders: Orders Placed This Encounter  Procedures   CBC with Differential/Platelet   Comprehensive metabolic panel with GFR   No orders of the defined types were placed in this encounter.    Follow-Up Instructions: Return for Rheumatoid arthritis.   Waddell CHRISTELLA Craze, PA-C  Note - This record has been created using Dragon software.  Chart creation errors have been sought, but may not  always  have been located. Such creation errors do not reflect on  the standard of medical care.

## 2024-09-03 ENCOUNTER — Encounter: Payer: Self-pay | Admitting: Physician Assistant

## 2024-09-03 ENCOUNTER — Ambulatory Visit: Attending: Physician Assistant | Admitting: Physician Assistant

## 2024-09-03 VITALS — BP 117/68 | HR 67 | Temp 98.2°F | Resp 14 | Ht 67.0 in | Wt 221.6 lb

## 2024-09-03 DIAGNOSIS — M25512 Pain in left shoulder: Secondary | ICD-10-CM

## 2024-09-03 DIAGNOSIS — M810 Age-related osteoporosis without current pathological fracture: Secondary | ICD-10-CM

## 2024-09-03 DIAGNOSIS — Z8679 Personal history of other diseases of the circulatory system: Secondary | ICD-10-CM

## 2024-09-03 DIAGNOSIS — M25511 Pain in right shoulder: Secondary | ICD-10-CM | POA: Diagnosis not present

## 2024-09-03 DIAGNOSIS — R911 Solitary pulmonary nodule: Secondary | ICD-10-CM

## 2024-09-03 DIAGNOSIS — M0579 Rheumatoid arthritis with rheumatoid factor of multiple sites without organ or systems involvement: Secondary | ICD-10-CM | POA: Diagnosis not present

## 2024-09-03 DIAGNOSIS — Z79899 Other long term (current) drug therapy: Secondary | ICD-10-CM

## 2024-09-03 DIAGNOSIS — Z8659 Personal history of other mental and behavioral disorders: Secondary | ICD-10-CM

## 2024-09-03 DIAGNOSIS — M3500 Sicca syndrome, unspecified: Secondary | ICD-10-CM

## 2024-09-03 DIAGNOSIS — R5383 Other fatigue: Secondary | ICD-10-CM

## 2024-09-03 DIAGNOSIS — Z8619 Personal history of other infectious and parasitic diseases: Secondary | ICD-10-CM

## 2024-09-03 DIAGNOSIS — G8929 Other chronic pain: Secondary | ICD-10-CM

## 2024-09-03 DIAGNOSIS — Z7952 Long term (current) use of systemic steroids: Secondary | ICD-10-CM | POA: Diagnosis not present

## 2024-09-03 DIAGNOSIS — Z8639 Personal history of other endocrine, nutritional and metabolic disease: Secondary | ICD-10-CM

## 2024-09-03 DIAGNOSIS — M19042 Primary osteoarthritis, left hand: Secondary | ICD-10-CM

## 2024-09-03 DIAGNOSIS — M19041 Primary osteoarthritis, right hand: Secondary | ICD-10-CM

## 2024-09-04 ENCOUNTER — Ambulatory Visit: Payer: Self-pay | Admitting: Physician Assistant

## 2024-09-04 ENCOUNTER — Encounter: Payer: Self-pay | Admitting: Internal Medicine

## 2024-09-04 LAB — CBC WITH DIFFERENTIAL/PLATELET
Absolute Lymphocytes: 2056 {cells}/uL (ref 850–3900)
Absolute Monocytes: 584 {cells}/uL (ref 200–950)
Basophils Absolute: 48 {cells}/uL (ref 0–200)
Basophils Relative: 0.6 %
Eosinophils Absolute: 104 {cells}/uL (ref 15–500)
Eosinophils Relative: 1.3 %
HCT: 41.1 % (ref 35.9–46.0)
Hemoglobin: 13.5 g/dL (ref 11.7–15.5)
MCH: 29.9 pg (ref 27.0–33.0)
MCHC: 32.8 g/dL (ref 31.6–35.4)
MCV: 91.1 fL (ref 81.4–101.7)
MPV: 10.2 fL (ref 7.5–12.5)
Monocytes Relative: 7.3 %
Neutro Abs: 5208 {cells}/uL (ref 1500–7800)
Neutrophils Relative %: 65.1 %
Platelets: 381 Thousand/uL (ref 140–400)
RBC: 4.51 Million/uL (ref 3.80–5.10)
RDW: 12.6 % (ref 11.0–15.0)
Total Lymphocyte: 25.7 %
WBC: 8 Thousand/uL (ref 3.8–10.8)

## 2024-09-04 LAB — COMPREHENSIVE METABOLIC PANEL WITH GFR
AG Ratio: 1.5 (calc) (ref 1.0–2.5)
ALT: 24 U/L (ref 6–29)
AST: 14 U/L (ref 10–35)
Albumin: 4.5 g/dL (ref 3.6–5.1)
Alkaline phosphatase (APISO): 65 U/L (ref 37–153)
BUN: 11 mg/dL (ref 7–25)
CO2: 25 mmol/L (ref 20–32)
Calcium: 9.5 mg/dL (ref 8.6–10.4)
Chloride: 99 mmol/L (ref 98–110)
Creat: 0.7 mg/dL (ref 0.50–1.05)
Globulin: 3 g/dL (ref 1.9–3.7)
Glucose, Bld: 105 mg/dL — ABNORMAL HIGH (ref 65–99)
Potassium: 4.2 mmol/L (ref 3.5–5.3)
Sodium: 136 mmol/L (ref 135–146)
Total Bilirubin: 0.6 mg/dL (ref 0.2–1.2)
Total Protein: 7.5 g/dL (ref 6.1–8.1)
eGFR: 97 mL/min/1.73m2 (ref >=60)

## 2024-09-04 NOTE — Progress Notes (Signed)
 CBC and CMP WNL

## 2024-09-05 NOTE — Telephone Encounter (Signed)
 Consent for plaquenil was not obtained.  Upon further thinking about the use of plaquenil--plaquenil cannot be crushed so I am not sure if she will have difficulty swallowing the tablets.   Could consider ultrasound guided injections if it is 1-2 joints but if more joints are involved oral prednisone  may be a better option.

## 2024-09-05 NOTE — Telephone Encounter (Signed)
 We may need to check with the pharmacy team regarding plaquenil formulations available.  If this is an option she will need to come by to sign a consent form and will need to schedule a baseline eye exam.

## 2024-09-06 NOTE — Progress Notes (Signed)
 Rx for hydroxychloroquine  COMPOUNDED MEDICATION: hydroxychloroquine 25mg /mL liquid Take 200mg  by mouth twice daily. #460ml (30 days) x 2 refills

## 2024-09-06 NOTE — Telephone Encounter (Signed)
 Hydroxychloroquine can be compounded - recipe has strength of 25mg /mL  For 200mg  single dose she'll need 8mL (ex. 200mg  twice daily = 8mL twice daily)  Rx would need to be sent to compounding pharmacy  [email or web address]

## 2024-09-07 LAB — GENECONNECT MOLECULAR SCREEN: Genetic Analysis Overall Interpretation: NEGATIVE

## 2024-09-09 ENCOUNTER — Telehealth: Payer: Self-pay | Admitting: Internal Medicine

## 2024-09-09 NOTE — Telephone Encounter (Signed)
 Contacted the patient and advised Sharon Hunter is unsure of the cost to compound plaquenil but it is ok to send to compound pharmacy if she is ready to start. Patient verbalized understanding. Patient states it is okay to send the medication to the pharmacy. Patient states she would like the medication sent to [facility] on [address].   Having a hard time finding any compounded version of Hydroxychloroquine. Please pend medication if possible.

## 2024-09-09 NOTE — Telephone Encounter (Signed)
 Patient came into the office today and signed a Plaquenil consent form. Please advise.

## 2024-09-09 NOTE — Telephone Encounter (Signed)
 Spoke with pt about Pre-authorization. Pt called to check status of referral. Advised pt hat pre authorization had not been completed. Pt requesting a status update, please advise.

## 2024-09-09 NOTE — Telephone Encounter (Signed)
 I am unsure of the cost to compound plaquenil but it is ok to send to compound pharmacy if she is ready to start.

## 2024-09-10 NOTE — Telephone Encounter (Signed)
 Per my previous notes, patient needs written rx for hydroxychloroquine compound. I cannot manage this remotely. Also needs to be sent to a pharmacy that compounds (not a [facility] pharmacy). Likely will be cashed out by pharmacy as insurnace is unlikely to cover anyways  Awilda Covin, PharmD, MPH, BCPS, CPP Clinical Pharmacist [facility])

## 2024-09-16 NOTE — Progress Notes (Unsigned)
 [facility] History and Physical   Primary Care Physician:  Antonio Meth, Jamee SAUNDERS, DO   Reason for Procedure:    Encounter Diagnosis  Name Primary?   Change in bowel habits Yes     Plan:    colonoscopy   The patient was provided an opportunity to ask questions and all were answered. The patient agreed with the plan.   HPI: Sharon Hunter is a 63 y.o. female here to evaluate change in bowel habits.Last colonoscopy 2013 - [facility]   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   Hypertension    IBS (irritable bowel syndrome) 08/01/2016   Joint pain    Lactose intolerance    LBBB (left bundle branch block) 01/30/2015   Migraine    Murmur 02/03/2011   Obese    Osteoporosis    Ovarian failure    Palpitation    Primary osteoarthritis of both hands 12/22/2016   Pulmonary nodule, left 2009   last CT Nov 2011-- due 08/2011   Rheumatoid arthritis(714.0)    in remission , off meds as of Summer 2012   Shingles 08/2023   Vitamin D  deficiency     Past Surgical History:  Procedure Laterality Date   CHOLECYSTECTOMY  1999   COLONOSCOPY  10/31/11   EXPLORATORY LAPAROTOMY     TONSILLECTOMY  1969   WISDOM TOOTH EXTRACTION       Current Outpatient Medications  Medication Sig Dispense Refill   amLODipine  (NORVASC ) 5 MG tablet Take 1 tablet (5 mg total) by mouth daily. 90 tablet 3   Calcium -Magnesium (CAL-MAG PO) Take by mouth 2 (two) times daily.     Cholecalciferol  125 MCG (5000 UT) CHEW Chew 1 tablet by mouth daily at 6 (six) AM. 30 tablet 0   denosumab  (PROLIA ) 60 MG/ML SOSY injection Inject 60 mg into the skin every 6 (six) months. Dx code: M81.0.  pt has appt on 08/28/24 1 mL 0   diclofenac  Sodium (VOLTAREN ) 1 % GEL Apply 2-4 grams to affected area up to 4 times daily. 400 g 2   Doxylamine Succinate, Sleep, (UNISOM PO) Take 1 tablet by mouth at bedtime. (Patient  taking differently: Take 1 tablet by mouth as needed.)     escitalopram  (LEXAPRO ) 20 MG tablet Take 1 tablet (20 mg total) by mouth daily. 90 tablet 1   famotidine  (PEPCID ) 20 MG tablet Take 1 tablet (20 mg total) by mouth daily. (Patient taking differently: Take 20 mg by mouth as needed.)     Golimumab  (SIMPONI  ARIA IV) Inject into the vein. 2mg /kg at Weeks 0, 4 then every 8 weeks thereafter (Patient not taking: Reported on 09/03/2024)     hydrochlorothiazide  (HYDRODIURIL ) 25 MG tablet Take 0.5 tablets (12.5 mg total) by mouth daily. 45 tablet 1   Ibuprofen (MOTRIN PO) Take by mouth as needed. (Patient not taking: Reported on 09/03/2024)     MELATONIN PO Take 5-10 mg by mouth at bedtime.     Multiple Vitamin (MULTIVITAMIN PO) Take by mouth daily. (Patient taking differently: Take by mouth as needed.)     predniSONE  (DELTASONE ) 5 MG tablet Take 2 tablets (10 mg total) by mouth daily with breakfast. (Patient not taking: Reported on 09/03/2024) 60 tablet 0   valsartan  (DIOVAN ) 80 MG tablet Take 1 tablet (80 mg total) by mouth daily. 90 tablet 3   Current Facility-Administered Medications  Medication Dose Route Frequency Provider Last Rate Last Admin   [START ON 02/24/2025] denosumab  (PROLIA ) injection 60 mg  60 mg Subcutaneous Q6 months Frann Mabel Mt, DO        Allergies as of 09/17/2024 - Review Complete 09/03/2024  Allergen Reaction Noted   Other Swelling 02/03/2011   Contrast media [iodinated contrast media]  12/27/2016   Rosuvastatin  Diarrhea 10/17/2023    Family History  Problem Relation Age of Onset   Cancer Mother    Arthritis Mother    Uterine cancer Mother    Hypertension Mother    Diabetes Mother    Esophageal cancer Mother    Other Mother        small bowel obstruction   Alcohol abuse Father    Hyperlipidemia Father    Heart disease Father    High blood pressure Father    Sleep apnea Father    Uterine cancer Sister    Heart disease Sister        cardiac stent    Diabetes Sister    Hyperlipidemia Sister    Hypertension Sister    Stroke Sister    Alcohol abuse Brother    Diabetes Brother    Hypertension Brother    Liver cancer Brother    Cancer Brother        lung   Cancer Maternal Aunt        lung----non smoker    Social History   Socioeconomic History   Marital status: Divorced    Spouse name: Not on file   Number of children: 1   Years of education: 17   Highest education level: Not on file  Occupational History   Occupation: RN  Tobacco Use   Smoking status: Former    Current packs/day: 0.00    Types: Cigarettes    Quit date: 10/03/1990    Years since quitting: 33.9    Passive exposure: Past   Smokeless tobacco: Never  Vaping Use   Vaping status: Never Used  Substance and Sexual Activity   Alcohol use: Yes    Alcohol/week: 3.0 standard drinks of alcohol    Types: 3 Standard drinks or equivalent per week    Comment: Red wine 4 oz occ   Drug use: No   Sexual activity: Not Currently    Partners: Male  Other Topics Concern   Not on file  Social History Narrative   Exercise-- jogs   Social Drivers of Health   Tobacco Use: Medium Risk (09/03/2024)   Patient History    Smoking Tobacco Use: Former    Smokeless Tobacco Use: Never    Passive Exposure: Past  Programmer, Applications: Not on [employer] Insecurity: Not on file  Transportation Needs: Not on file  Physical Activity: Not on file  Stress: Not on file  Social Connections: Not on file  Intimate Partner Violence: Not on file  Depression (PHQ2-9): High Risk (04/23/2024)   Depression (PHQ2-9)    PHQ-2 Score: 14  Alcohol Screen: Not on file  Housing: Not on file  Utilities: Not on file  Health Literacy: Not on file    Review of Systems: Positive for *** All other review of systems negative except as mentioned in the HPI.  Physical Exam: Vital signs LMP 10/03/2005   General:   Alert,  Well-developed, well-nourished, pleasant and cooperative in NAD Lungs:   Clear throughout to auscultation.   Heart:  Regular rate and rhythm; no murmurs, clicks, rubs,  or gallops. Abdomen:  Soft, nontender and nondistended. Normal bowel sounds.   Neuro/Psych:  Alert and cooperative. Normal mood and affect. A and O x 3   @Jareb Radoncic  Sharon Commander, MD, [facility] Gastroenterology [phone] (pager) 09/16/2024 6:55 PM@

## 2024-09-17 ENCOUNTER — Encounter: Payer: Self-pay | Admitting: Internal Medicine

## 2024-09-17 ENCOUNTER — Ambulatory Visit: Admitting: Internal Medicine

## 2024-09-17 VITALS — BP 119/66 | HR 66 | Temp 98.2°F | Resp 18 | Ht 67.0 in | Wt 222.0 lb

## 2024-09-17 DIAGNOSIS — R194 Change in bowel habit: Secondary | ICD-10-CM

## 2024-09-17 MED ORDER — SODIUM CHLORIDE 0.9 % IV SOLN: 500.0000 mL | INTRAVENOUS | Status: DC

## 2024-09-17 NOTE — Op Note (Signed)
 [facility] Patient Name: Sharon Hunter Procedure Date: 09/17/2024 9:31 AM MRN: [id number] Endoscopist: Lupita FORBES Commander , MD, [id number] Age: 63 Referring MD:  Date of Birth: 08/09/1961 Gender: Female Account #: [id number] Procedure:                Colonoscopy Indications:              Change in bowel habits Medicines:                Monitored Anesthesia Care Procedure:                Pre-Anesthesia Assessment:                           - Prior to the procedure, a History and Physical                            was performed, and patient medications and                            allergies were reviewed. The patient's tolerance of                            previous anesthesia was also reviewed. The risks                            and benefits of the procedure and the sedation                            options and risks were discussed with the patient.                            All questions were answered, and informed consent                            was obtained. Prior Anticoagulants: The patient has                            taken no anticoagulant or antiplatelet agents. ASA                            Grade Assessment: II - A patient with mild systemic                            disease. After reviewing the risks and benefits,                            the patient was deemed in satisfactory condition to                            undergo the procedure.                           After obtaining informed consent, the colonoscope  was passed under direct vision. Throughout the                            procedure, the patient's blood pressure, pulse, and                            oxygen saturations were monitored continuously. The                            CF [id number] #[id number] was introduced through the anus                            and advanced to the the terminal ileum, with                            identification of the appendiceal  orifice and IC                            valve. The colonoscopy was performed without                            difficulty. The patient tolerated the procedure                            well. The quality of the bowel preparation was                            excellent. The terminal ileum, ileocecal valve,                            appendiceal orifice, and rectum were photographed.                            The bowel preparation used was SUPREP via split                            dose instruction. Scope In: 9:49:17 AM Scope Out: 10:00:40 AM Scope Withdrawal Time: 0 hours 8 minutes 42 seconds  Total Procedure Duration: 0 hours 11 minutes 23 seconds  Findings:                 The perianal and digital rectal examinations were                            normal.                           The terminal ileum appeared normal.                           External hemorrhoids were found during retroflexion.                           Anal papilla(e) were hypertrophied.  The exam was otherwise without abnormality on                            direct and retroflexion views.                           Biopsies for histology were taken with a cold                            forceps from the entire colon for evaluation of                            microscopic colitis. Complications:            No immediate complications. Estimated Blood Loss:     Estimated blood loss was minimal. Impression:               - The examined portion of the ileum was normal.                           - External hemorrhoids.                           - Anal papilla(e) were hypertrophied.                           - The examination was otherwise normal on direct                            and retroflexion views.                           - Biopsies were taken with a cold forceps from the                            entire colon for evaluation of microscopic colitis. Recommendation:           -  Patient has a contact number available for                            emergencies. The signs and symptoms of potential                            delayed complications were discussed with the                            patient. Return to normal activities tomorrow.                            Written discharge instructions were provided to the                            patient.                           - Resume previous diet.                           -  Continue present medications.                           - Await pathology results. Consider bile salt                            diarrhea (though was having alternating bowel                            habits now reports diarrhea predominance, she                            sttributed constipation to medication that was                            stopped)                           - Repeat colonoscopy in 10 years. Lupita FORBES Commander, MD 09/17/2024 10:09:52 AM This report has been signed electronically.

## 2024-09-17 NOTE — Progress Notes (Signed)
 Called to room to assist during endoscopic procedure.  Patient ID and intended procedure confirmed with present staff. Received instructions for my participation in the procedure from the performing physician.

## 2024-09-17 NOTE — Patient Instructions (Addendum)
 Colonoscopy was normal though your hemorrhoids were a little swollen (commonly seen).  I took biopsies of the colon lining to see if that would explain why you are having diarrhea.  Once I see those results I will make recommendations and plans.  I appreciate the opportunity to care for you. Lupita CHARLENA Commander, MD, FACG  YOU HAD AN ENDOSCOPIC PROCEDURE TODAY AT THE [facility]:   Refer to the procedure report that was given to you for any specific questions about what was found during the examination.  If the procedure report does not answer your questions, please call your gastroenterologist to clarify.  If you requested that your care partner not be given the details of your procedure findings, then the procedure report has been included in a sealed envelope for you to review at your convenience later.  YOU SHOULD EXPECT: Some feelings of bloating in the abdomen. Passage of more gas than usual.  Walking can help get rid of the air that was put into your GI tract during the procedure and reduce the bloating. If you had a lower endoscopy (such as a colonoscopy or flexible sigmoidoscopy) you may notice spotting of blood in your stool or on the toilet paper. If you underwent a bowel prep for your procedure, you may not have a normal bowel movement for a few days.  Please Note:  You might notice some irritation and congestion in your nose or some drainage.  This is from the oxygen used during your procedure.  There is no need for concern and it should clear up in a day or so.  SYMPTOMS TO REPORT IMMEDIATELY:  Following lower endoscopy (colonoscopy or flexible sigmoidoscopy):  Excessive amounts of blood in the stool  Significant tenderness or worsening of abdominal pains  Swelling of the abdomen that is new, acute  Fever of 100F or higher  For urgent or emergent issues, a gastroenterologist can be reached at any hour by calling [phone]. Do not use MyChart messaging for urgent  concerns.    DIET:  We do recommend a small meal at first, but then you may proceed to your regular diet.  Drink plenty of fluids but you should avoid alcoholic beverages for 24 hours.  ACTIVITY:  You should plan to take it easy for the rest of today and you should NOT DRIVE or use heavy machinery until tomorrow (because of the sedation medicines used during the test).    FOLLOW UP: Our staff will call the number listed on your records the next business day following your procedure.  We will call around 7:15- 8:00 am to check on you and address any questions or concerns that you may have regarding the information given to you following your procedure. If we do not reach you, we will leave a message.     If any biopsies were taken you will be contacted by phone or by letter within the next 1-3 weeks.  Please call us  at [phone] if you have not heard about the biopsies in 3 weeks.    SIGNATURES/CONFIDENTIALITY: You and/or your care partner have signed paperwork which will be entered into your electronic medical record.  These signatures attest to the fact that that the information above on your After Visit Summary has been reviewed and is understood.  Full responsibility of the confidentiality of this discharge information lies with you and/or your care-partner.

## 2024-09-17 NOTE — Progress Notes (Signed)
 Report to PACU, RN, vss, BBS= Clear.

## 2024-09-18 ENCOUNTER — Telehealth: Payer: Self-pay

## 2024-09-18 NOTE — Telephone Encounter (Signed)
°  Follow up Call-     09/17/2024    8:43 AM  Call back number  Post procedure Call Back phone  # [phone]  Permission to leave phone message Yes     Patient questions:  Do you have a fever, pain , or abdominal swelling? No. Pain Score  0 *  Have you tolerated food without any problems? Yes.    Have you been able to return to your normal activities? Yes.    Do you have any questions about your discharge instructions: Diet   No. Medications  No. Follow up visit  No.  Do you have questions or concerns about your Care? No.  Actions: * If pain score is 4 or above: No action needed, pain <4.

## 2024-09-19 LAB — SURGICAL PATHOLOGY

## 2024-09-23 NOTE — Telephone Encounter (Signed)
 A representative at [facility] contacted the office and states she plans on sending over a letter with information for us . The letter is going to be faxed over today. The representative's phone number is [phone] ex. [phone] and the fax number is [phone].

## 2024-09-24 ENCOUNTER — Other Ambulatory Visit: Payer: Self-pay

## 2024-09-24 ENCOUNTER — Encounter (HOSPITAL_COMMUNITY)

## 2024-09-24 ENCOUNTER — Ambulatory Visit: Payer: Self-pay | Admitting: Internal Medicine

## 2024-09-24 NOTE — Progress Notes (Signed)
 "  Office Visit Note  Patient: Sharon Hunter             Date of Birth: 1961-02-08           MRN: [id number]             PCP: Antonio Cyndee Jamee JONELLE, DO Referring: Antonio Cyndee Jamee JONELLE, DO Visit Date: 10/07/2024 Occupation: Data Unavailable  Subjective:  Discuss medication options   History of Present Illness: Sharon Hunter is a 63 y.o. female with history of rheumatoid arthritis.   Patient presents today with increased pain, stiffness, and inflammation affecting multiple joints.  She continues to have frequent flares involving multiple joints.  She has been taking Motrin 2 tablets twice daily for symptomatic relief.  She has been trying to avoid the use of prednisone .  She decided against initiating Plaquenil. She underwent a mesenchymal cell infusion on 08/23/2024 and is planning on holding off on reinitiating a DMARD/biologic until at least a 30-month or possibly a 3-month mark. Patient would like to discuss options to better manage her pain levels.   Activities of Daily Living:  Patient reports morning stiffness for all day. Patient Reports nocturnal pain.  Difficulty dressing/grooming: Reports Difficulty climbing stairs: Reports Difficulty getting out of chair: Reports Difficulty using hands for taps, buttons, cutlery, and/or writing: Reports  Review of Systems  Constitutional:  Positive for fatigue.  HENT:  Positive for mouth dryness. Negative for mouth sores.   Eyes:  Positive for dryness.  Respiratory:  Negative for shortness of breath.   Cardiovascular:  Negative for chest pain and palpitations.  Gastrointestinal:  Positive for constipation and diarrhea. Negative for blood in stool.  Endocrine: Negative for increased urination.  Genitourinary:  Negative for involuntary urination.  Musculoskeletal:  Positive for joint pain, gait problem, joint pain, joint swelling, muscle weakness and morning stiffness. Negative for myalgias, muscle tenderness and myalgias.  Skin:  Negative  for color change, rash, hair loss and sensitivity to sunlight.  Allergic/Immunologic: Positive for susceptible to infections.  Neurological:  Negative for dizziness and headaches.  Hematological:  Negative for swollen glands.  Psychiatric/Behavioral:  Negative for depressed mood and sleep disturbance. The patient is not nervous/anxious.     PMFS History:  Patient Active Problem List   Diagnosis Date Noted   Rheumatoid arthritis involving multiple sites with positive rheumatoid factor (HCC) 06/14/2024   Osteopenia 04/23/2024   Depression with anxiety 04/23/2024   Acute otitis media 08/11/2023   Mood disorder (HCC)- Emotional Eating 08/02/2022   Class 1 obesity with serious comorbidity and body mass index (BMI) of 30.0 to 30.9 in adult 08/02/2022   At risk for impaired metabolic function 05/30/2022   Perioral dermatitis 05/26/2022   Preventative health care 05/26/2022   Essential hypertension 05/26/2022   Abnormal EKG 05/26/2022   Prediabetes 03/09/2022   Vitamin B12 deficiency 03/09/2022   Primary hypertension 02/22/2022   Hyperlipidemia 02/22/2022   GAD (generalized anxiety disorder) 02/22/2022   Vitamin D  deficiency 02/22/2022   Ear pain, left 08/13/2021   History of anxiety 03/27/2017   History of hypertension 03/27/2017   History of bundle branch block 03/27/2017   Primary osteoarthritis of both hands 12/22/2016   History of shingles 08/02/2016   High risk medication use 08/02/2016   Hypertension 08/01/2016   IBS (irritable bowel syndrome) 08/01/2016   Anxiety 08/01/2016   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   LBBB (left bundle branch block) 01/30/2015   Palpitations 01/30/2015   Pulmonary nodule 07/04/2011  Murmur 02/03/2011   Rheumatoid arthritis (HCC) 02/03/2011   Hair loss 02/03/2011   Fatigue 02/03/2011    Past Medical History:  Diagnosis Date   Anxiety    Breast lump    stable-- gets annual mammogram   History of primary ovarian failure  08/01/2016   Hypertension    IBS (irritable bowel syndrome) 08/01/2016   Joint pain    Lactose intolerance    LBBB (left bundle branch block) 01/30/2015   Migraine    Murmur 02/03/2011   Obese    Osteoporosis    Ovarian failure    Palpitation    Primary osteoarthritis of both hands 12/22/2016   Pulmonary nodule, left 2009   last CT Nov 2011-- due 08/2011   Rheumatoid arthritis(714.0)    in remission , off meds as of Summer 2012   Shingles 08/2023   Vitamin D  deficiency     Family History  Problem Relation Age of Onset   Cancer Mother    Arthritis Mother    Uterine cancer Mother    Hypertension Mother    Diabetes Mother    Esophageal cancer Mother    Other Mother        small bowel obstruction   Alcohol abuse Father    Hyperlipidemia Father    Heart disease Father    High blood pressure Father    Sleep apnea Father    Uterine cancer Sister    Heart disease Sister        cardiac stent   Diabetes Sister    Hyperlipidemia Sister    Hypertension Sister    Stroke Sister    Alcohol abuse Brother    Diabetes Brother    Hypertension Brother    Liver cancer Brother    Cancer Brother        lung   Cancer Maternal Aunt        lung----non smoker   Past Surgical History:  Procedure Laterality Date   CHOLECYSTECTOMY  1999   COLONOSCOPY  10/31/11   EXPLORATORY LAPAROTOMY     TONSILLECTOMY  1969   WISDOM TOOTH EXTRACTION     Social History[1] Social History   Social History Narrative   Exercise-- jogs     Immunization History  Administered Date(s) Administered   Influenza Split 07/03/2013   Influenza,inj,Quad PF,6+ Mos 07/01/2023   Influenza,inj,Quad PF,6-35 Mos 07/17/2020   Influenza-Unspecified 07/03/2014, 07/04/2015   PFIZER(Purple Top)SARS-COV-2 Vaccination 01/13/2020, 05/15/2020   Tdap 12/01/2009     Objective: Vital Signs: BP 120/77   Pulse 75   Temp 97.9 F (36.6 C)   Resp 16   Ht 5' 6.75 (1.695 m)   Wt 216 lb 6.4 oz (98.2 kg)   LMP  10/03/2005   BMI 34.15 kg/m    Physical Exam Vitals and nursing note reviewed.  Constitutional:      Appearance: She is well-developed.  HENT:     Head: Normocephalic and atraumatic.  Eyes:     Conjunctiva/sclera: Conjunctivae normal.  Cardiovascular:     Rate and Rhythm: Normal rate and regular rhythm.     Heart sounds: Normal heart sounds.  Pulmonary:     Effort: Pulmonary effort is normal.     Breath sounds: Normal breath sounds.  Abdominal:     General: Bowel sounds are normal.     Palpations: Abdomen is soft.  Musculoskeletal:     Cervical back: Normal range of motion.  Lymphadenopathy:     Cervical: No cervical adenopathy.  Skin:  General: Skin is warm and dry.     Capillary Refill: Capillary refill takes less than 2 seconds.  Neurological:     Mental Status: She is alert and oriented to person, place, and time.  Psychiatric:        Behavior: Behavior normal.      Musculoskeletal Exam: C-spine has limited range of motion with discomfort. Painful range of motion of both shoulders with tenderness upon palpation.  No tenderness along the elbow joint line.  Inflammation noted on the dorsal aspect of both wrist joints.  Tenderness and synovitis affecting several MCP and PIP joints as described below.  Good range of motion of both hip joints with no groin pain currently.  Painful range of motion of the left knee with warmth and swelling.  No warmth or effusion of the right knee joint noted.  Tenderness of both ankle joints.   CDAI Exam: CDAI Score: -- Patient Global: --; Provider Global: -- Swollen: 10 ; Tender: 16  Joint Exam 10/07/2024      Right  Left  Glenohumeral   Tender   Tender  Wrist   Tender   Tender  MCP 2  Swollen Tender  Swollen Tender  MCP 3  Swollen Tender  Swollen Tender  MCP 5  Swollen Tender  Swollen Tender  PIP 2 (finger)  Swollen Tender     PIP 5 (finger)  Swollen Tender  Swollen Tender  Knee     Swollen Tender  Ankle   Tender   Tender      Investigation: No additional findings.  Imaging: No results found.  Recent Labs: Lab Results  Component Value Date   WBC 8.0 09/03/2024   HGB 13.5 09/03/2024   PLT 381 09/03/2024   NA 136 09/03/2024   K 4.2 09/03/2024   CL 99 09/03/2024   CO2 25 09/03/2024   GLUCOSE 105 (H) 09/03/2024   BUN 11 09/03/2024   CREATININE 0.70 09/03/2024   BILITOT 0.6 09/03/2024   ALKPHOS 46 06/10/2024   AST 14 09/03/2024   ALT 24 09/03/2024   PROT 7.5 09/03/2024   ALBUMIN 4.1 06/10/2024   CALCIUM  9.5 09/03/2024   GFRAA 86 01/07/2021   QFTBGOLDPLUS Negative 05/07/2024    Speciality Comments: TB Gold: 07/28/2023 Neg  Prior therapy: methotrexate, remicade , orencia, and enbrel (inadequate response), Simponi  stopped Dec 2023, Actemra  started 10/17/22-June 2024; Xeljanz  liq started 03/27/23  Procedures:  No procedures performed Allergies: Contrast media [iodinated contrast media], Other, and Rosuvastatin    Assessment / Plan:     Visit Diagnoses: Rheumatoid arthritis involving multiple sites with positive rheumatoid factor (HCC) - Prev therapy:methotrexate, Remicade , Orencia, Enbrel, Simponi , Actemra , Xeljanz , arava : Patient underwent a mesenchymal cell infusion on 08/23/2024 for management of rheumatoid arthritis.  She continues to have diffuse arthralgias, fatigue, and brain fog.  She has active inflammation involving multiple joints as described above. The last dose of Simponi  Aria was administered on 06/14/2024 and she has been off of prednisone  since 08/17/2024.  She has been taking Motrin 2 tablets twice daily for symptomatic relief.  She has been avoiding use of prednisone  due to not wanting to suppress the potential benefits of the mesenchymal cells.  She continues to have frequent flares affecting multiple joints.  She has active synovitis involving multiple joints today.  Her ADLs are severely limited due to the severity of symptoms.  Her quality of life has been significantly impacted by  the severity of rheumatoid arthritis.  Discussed the concern for more systemic involvement the  longer she is off of immunosuppressive therapy.  Patient would like to wait to receive the maximum benefits with the mesenchymal infusion at the 91-month and possibly the 97-month mark prior to adding any immunosuppressants.   She has decided against initiating Plaquenil.  She would like to pursue other options for pain management.  Discussed the possible option of switching from Lexapro  to Cymbalta .  She is unable to swallow pills and Cymbalta  is a capsule so I will look into if the capsule can be broken for administration or not. Plan to also place a referral to pain management to discuss treatment alternatives. She will follow up in 2 months or sooner if needed.   High risk medication use - Not currently taking any immunosuppressive agents.  Previous treatment includes Xeljanz , Arava , methotrexate, Remicade , Orencia, Enbrel, Simponi , Actemra , and Remicade . CBC and CMP updated on 09/03/24.  Plan to update lab work in March 2026.   Long term (current) use of systemic steroids - Off prednisone  since 08/17/24.  Chronic pain of both shoulders: Chronic pain.  Difficulty with ROM due to severity of pain.  Tenderness upon palpation bilaterally.    Primary osteoarthritis of both hands: Patient presents today with pain and inflammation affecting both hands.  She is taking motrin 2 tablets by mouth twice daily for pain relief.  She has been trying to avoid the use of prednisone .   Plan to refer the patient to pain management.   Other fatigue: She continues to experience chronic fatigue likely secondary to active rheumatoid arthritis and nocturnal pain contributing to interrupted sleep at night.   Sicca syndrome: Chronic, stable.   Other medical conditions are listed as follows:  Age-related osteoporosis without current pathological fracture - DEXA 04/17/2024:completed the one year course of evenity  (completed  01/11/24), transitioned to prolia  injections every 6 months.  She received prolia  on 08/28/24.  History of vitamin D  deficiency: She is taking a daily vitamin D  supplement.   Pulmonary nodule  History of hypertension: Blood pressure was 120/77 today in the office.  History of bundle branch block  History of anxiety  History of hyperlipidemia  History of shingles  Orders: No orders of the defined types were placed in this encounter.  No orders of the defined types were placed in this encounter.    Follow-Up Instructions: Return in about 2 months (around 12/05/2024) for Rheumatoid arthritis.   Waddell CHRISTELLA Craze, PA-C  Note - This record has been created using Dragon software.  Chart creation errors have been sought, but may not always  have been located. Such creation errors do not reflect on  the standard of medical care.     [1]  Social History Tobacco Use   Smoking status: Former    Current packs/day: 0.00    Types: Cigarettes    Quit date: 10/03/1990    Years since quitting: 34.0    Passive exposure: Past   Smokeless tobacco: Never  Vaping Use   Vaping status: Never Used  Substance Use Topics   Alcohol use: Not Currently   Drug use: No   "

## 2024-09-28 ENCOUNTER — Other Ambulatory Visit: Payer: Self-pay | Admitting: Physician Assistant

## 2024-09-30 ENCOUNTER — Other Ambulatory Visit (HOSPITAL_COMMUNITY): Payer: Self-pay

## 2024-09-30 ENCOUNTER — Other Ambulatory Visit: Payer: Self-pay

## 2024-09-30 NOTE — Telephone Encounter (Signed)
 Last Fill: 06/19/2024   Next Visit: 10/07/2024  Last Visit: 09/03/2024  DX: Rheumatoid arthritis involving multiple sites with positive rheumatoid factor   Current Dose per office note on 09/03/2024: Off prednisone  since 08/17/24.

## 2024-10-02 ENCOUNTER — Other Ambulatory Visit: Payer: Self-pay

## 2024-10-03 ENCOUNTER — Encounter (HOSPITAL_COMMUNITY): Payer: Self-pay | Admitting: Rheumatology

## 2024-10-07 ENCOUNTER — Ambulatory Visit: Attending: Physician Assistant | Admitting: Physician Assistant

## 2024-10-07 ENCOUNTER — Encounter: Payer: Self-pay | Admitting: Physician Assistant

## 2024-10-07 VITALS — BP 120/77 | HR 75 | Temp 97.9°F | Resp 16 | Ht 66.75 in | Wt 216.4 lb

## 2024-10-07 DIAGNOSIS — M0579 Rheumatoid arthritis with rheumatoid factor of multiple sites without organ or systems involvement: Secondary | ICD-10-CM | POA: Diagnosis not present

## 2024-10-07 DIAGNOSIS — G8929 Other chronic pain: Secondary | ICD-10-CM

## 2024-10-07 DIAGNOSIS — Z79899 Other long term (current) drug therapy: Secondary | ICD-10-CM

## 2024-10-07 DIAGNOSIS — Z8679 Personal history of other diseases of the circulatory system: Secondary | ICD-10-CM | POA: Diagnosis not present

## 2024-10-07 DIAGNOSIS — Z8659 Personal history of other mental and behavioral disorders: Secondary | ICD-10-CM

## 2024-10-07 DIAGNOSIS — M810 Age-related osteoporosis without current pathological fracture: Secondary | ICD-10-CM | POA: Diagnosis not present

## 2024-10-07 DIAGNOSIS — M19041 Primary osteoarthritis, right hand: Secondary | ICD-10-CM | POA: Diagnosis not present

## 2024-10-07 DIAGNOSIS — Z8619 Personal history of other infectious and parasitic diseases: Secondary | ICD-10-CM

## 2024-10-07 DIAGNOSIS — M19042 Primary osteoarthritis, left hand: Secondary | ICD-10-CM

## 2024-10-07 DIAGNOSIS — M25512 Pain in left shoulder: Secondary | ICD-10-CM

## 2024-10-07 DIAGNOSIS — M25511 Pain in right shoulder: Secondary | ICD-10-CM

## 2024-10-07 DIAGNOSIS — R5383 Other fatigue: Secondary | ICD-10-CM

## 2024-10-07 DIAGNOSIS — Z7952 Long term (current) use of systemic steroids: Secondary | ICD-10-CM

## 2024-10-07 DIAGNOSIS — M3500 Sicca syndrome, unspecified: Secondary | ICD-10-CM | POA: Diagnosis not present

## 2024-10-07 DIAGNOSIS — Z8639 Personal history of other endocrine, nutritional and metabolic disease: Secondary | ICD-10-CM

## 2024-10-07 DIAGNOSIS — R911 Solitary pulmonary nodule: Secondary | ICD-10-CM | POA: Diagnosis not present

## 2024-10-07 NOTE — Addendum Note (Signed)
 Addended by: YVONE DAVED BROCKS on: 10/07/2024 04:14 PM   Modules accepted: Orders

## 2024-10-08 ENCOUNTER — Encounter: Payer: Self-pay | Admitting: Family Medicine

## 2024-10-08 ENCOUNTER — Other Ambulatory Visit: Payer: Self-pay

## 2024-10-09 ENCOUNTER — Other Ambulatory Visit (HOSPITAL_COMMUNITY): Payer: Self-pay

## 2024-10-09 ENCOUNTER — Encounter (HOSPITAL_COMMUNITY): Payer: Self-pay | Admitting: Rheumatology

## 2024-10-09 MED ORDER — DULOXETINE HCL 60 MG PO CPEP
60.0000 mg | ORAL_CAPSULE | Freq: Every day | ORAL | 2 refills | Status: AC
  Filled 2024-10-09: qty 30, 30d supply, fill #0
  Filled 2024-11-06: qty 60, 60d supply, fill #1

## 2024-10-14 ENCOUNTER — Other Ambulatory Visit (HOSPITAL_COMMUNITY): Payer: Self-pay

## 2024-10-14 MED ORDER — PREDNISONE 5 MG PO TABS
ORAL_TABLET | ORAL | 0 refills | Status: DC
  Filled 2024-10-14: qty 40, 16d supply, fill #0

## 2024-10-14 NOTE — Telephone Encounter (Signed)
 Ok to send in prednisone  20 mg tapering by 5 mg every 4 days.  Take prednisone  in the morning with food and avoid NSAID use.

## 2024-10-17 ENCOUNTER — Telehealth: Payer: Self-pay

## 2024-10-17 NOTE — Telephone Encounter (Addendum)
 Patient contacted office stating she took 4th dose of prednisone  yesterday. Patient states that the first two days prednisone  did not help, but third day she was able to have about 12-14 hours of relief. Patient states that 20 mg dosage is not enough for her flare up. Patient would like an increase dose of prednisone . Please advise

## 2024-10-18 ENCOUNTER — Other Ambulatory Visit (HOSPITAL_BASED_OUTPATIENT_CLINIC_OR_DEPARTMENT_OTHER): Payer: Self-pay

## 2024-10-18 MED ORDER — PREDNISONE 5 MG PO TABS
ORAL_TABLET | ORAL | 0 refills | Status: AC
  Filled 2024-10-18: qty 147, 42d supply, fill #0
  Filled 2024-10-21: qty 130, 30d supply, fill #0

## 2024-10-18 NOTE — Addendum Note (Signed)
 Addended by: Tanessa Tidd L on: 10/18/2024 10:00 AM   Modules accepted: Orders

## 2024-10-18 NOTE — Addendum Note (Signed)
 Addended by: CENA ALFONSO CROME on: 10/18/2024 10:28 AM   Modules accepted: Orders

## 2024-10-21 ENCOUNTER — Other Ambulatory Visit (HOSPITAL_BASED_OUTPATIENT_CLINIC_OR_DEPARTMENT_OTHER): Payer: Self-pay

## 2024-10-30 ENCOUNTER — Other Ambulatory Visit (HOSPITAL_COMMUNITY): Payer: Self-pay

## 2024-11-06 ENCOUNTER — Encounter: Payer: Self-pay | Admitting: Rheumatology

## 2024-11-06 ENCOUNTER — Other Ambulatory Visit (HOSPITAL_COMMUNITY): Payer: Self-pay

## 2024-11-08 ENCOUNTER — Other Ambulatory Visit (HOSPITAL_COMMUNITY): Payer: Self-pay

## 2024-11-08 ENCOUNTER — Telehealth: Payer: Self-pay

## 2024-11-08 MED ORDER — DOXYCYCLINE HYCLATE 100 MG PO CAPS
100.0000 mg | ORAL_CAPSULE | Freq: Every day | ORAL | 1 refills | Status: AC
  Filled 2024-11-08: qty 30, 30d supply, fill #0

## 2024-11-08 NOTE — Telephone Encounter (Signed)
 Patient advised Ok to continue prednisone .  Prednisone  can cause mild immunosuppression so she may require a longer course since she is taking prednisone . Patient verbalized understanding.

## 2024-11-08 NOTE — Telephone Encounter (Signed)
 Ok to continue prednisone .  Prednisone  can cause mild immunosuppression so she may require a longer course since she is taking prednisone .

## 2024-11-08 NOTE — Telephone Encounter (Signed)
 Patient called the office to see if she can take predisone with a antibiotic? Patient is going to Dr Margaretha today and believes she has a infection in her tooth. She has not yet been placed on an antibiotic but wants to know what she will have to do if she can not take them together. Patient says it is okay to leave a detailed message on her voicemail if she doesn't answer. Please advise

## 2024-11-19 ENCOUNTER — Encounter (HOSPITAL_COMMUNITY)

## 2024-12-05 ENCOUNTER — Ambulatory Visit: Admitting: Physician Assistant
